# Patient Record
Sex: Female | Born: 1970
Health system: Southern US, Community
[De-identification: ages and names within clinical notes are randomized; demographics above are authoritative.]

## PROBLEM LIST (undated history)

## (undated) DIAGNOSIS — I5020 Unspecified systolic (congestive) heart failure: Secondary | ICD-10-CM

## (undated) DIAGNOSIS — I2510 Atherosclerotic heart disease of native coronary artery without angina pectoris: Secondary | ICD-10-CM

## (undated) DIAGNOSIS — R7989 Other specified abnormal findings of blood chemistry: Secondary | ICD-10-CM

## (undated) DIAGNOSIS — J9610 Chronic respiratory failure, unspecified whether with hypoxia or hypercapnia: Secondary | ICD-10-CM

## (undated) DIAGNOSIS — M109 Gout, unspecified: Secondary | ICD-10-CM

## (undated) DIAGNOSIS — I5030 Unspecified diastolic (congestive) heart failure: Secondary | ICD-10-CM

## (undated) DIAGNOSIS — E039 Hypothyroidism, unspecified: Secondary | ICD-10-CM

## (undated) DIAGNOSIS — R778 Other specified abnormalities of plasma proteins: Secondary | ICD-10-CM

## (undated) DIAGNOSIS — J449 Chronic obstructive pulmonary disease, unspecified: Secondary | ICD-10-CM

## (undated) DIAGNOSIS — J40 Bronchitis, not specified as acute or chronic: Secondary | ICD-10-CM

## (undated) DIAGNOSIS — I5041 Acute combined systolic (congestive) and diastolic (congestive) heart failure: Secondary | ICD-10-CM

## (undated) DIAGNOSIS — J189 Pneumonia, unspecified organism: Secondary | ICD-10-CM

## (undated) DIAGNOSIS — E662 Morbid (severe) obesity with alveolar hypoventilation: Secondary | ICD-10-CM

## (undated) DIAGNOSIS — N183 Chronic kidney disease, stage 3 unspecified: Secondary | ICD-10-CM

## (undated) DIAGNOSIS — I420 Dilated cardiomyopathy: Secondary | ICD-10-CM

## (undated) DIAGNOSIS — Z720 Tobacco use: Secondary | ICD-10-CM

## (undated) HISTORY — PX: THYROID SURGERY: SHX805

## (undated) HISTORY — DX: Dilated cardiomyopathy: I42.0

## (undated) HISTORY — DX: Chronic kidney disease, stage 3 unspecified: N18.30

## (undated) HISTORY — DX: Gout, unspecified: M10.9

## (undated) HISTORY — DX: Other specified abnormal findings of blood chemistry: R79.89

## (undated) HISTORY — DX: Other specified abnormalities of plasma proteins: R77.8

## (undated) HISTORY — DX: Hypothyroidism, unspecified: E03.9

---

## 2017-08-10 DIAGNOSIS — J189 Pneumonia, unspecified organism: Secondary | ICD-10-CM

## 2017-08-10 DIAGNOSIS — Z720 Tobacco use: Secondary | ICD-10-CM

## 2017-08-10 DIAGNOSIS — J40 Bronchitis, not specified as acute or chronic: Secondary | ICD-10-CM | POA: Diagnosis present

## 2017-08-10 HISTORY — DX: Bronchitis, not specified as acute or chronic: J40

## 2017-08-10 HISTORY — DX: Tobacco use: Z72.0

## 2017-08-10 HISTORY — DX: Pneumonia, unspecified organism: J18.9

## 2017-09-13 DIAGNOSIS — I5020 Unspecified systolic (congestive) heart failure: Secondary | ICD-10-CM

## 2017-09-13 DIAGNOSIS — I5033 Acute on chronic diastolic (congestive) heart failure: Secondary | ICD-10-CM | POA: Diagnosis not present

## 2017-09-13 DIAGNOSIS — J441 Chronic obstructive pulmonary disease with (acute) exacerbation: Secondary | ICD-10-CM | POA: Diagnosis not present

## 2017-09-13 DIAGNOSIS — I214 Non-ST elevation (NSTEMI) myocardial infarction: Secondary | ICD-10-CM | POA: Diagnosis not present

## 2017-09-13 DIAGNOSIS — A419 Sepsis, unspecified organism: Secondary | ICD-10-CM | POA: Diagnosis not present

## 2017-09-13 DIAGNOSIS — E039 Hypothyroidism, unspecified: Secondary | ICD-10-CM

## 2017-09-13 DIAGNOSIS — R0602 Shortness of breath: Secondary | ICD-10-CM | POA: Diagnosis not present

## 2017-09-13 DIAGNOSIS — J189 Pneumonia, unspecified organism: Secondary | ICD-10-CM | POA: Diagnosis not present

## 2017-09-13 DIAGNOSIS — I509 Heart failure, unspecified: Secondary | ICD-10-CM | POA: Diagnosis not present

## 2017-09-13 DIAGNOSIS — J9621 Acute and chronic respiratory failure with hypoxia: Secondary | ICD-10-CM | POA: Diagnosis not present

## 2017-09-13 DIAGNOSIS — I10 Essential (primary) hypertension: Secondary | ICD-10-CM | POA: Diagnosis not present

## 2017-09-13 HISTORY — DX: Unspecified systolic (congestive) heart failure: I50.20

## 2017-09-14 ENCOUNTER — Inpatient Hospital Stay (HOSPITAL_COMMUNITY)
Admission: AD | Admit: 2017-09-14 | Discharge: 2017-09-20 | DRG: 246 | Disposition: A | Discharge status: Home / Self Care | Payer: BLUE CROSS/BLUE SHIELD | Source: Other Acute Inpatient Hospital | Attending: Family Medicine | Admitting: Family Medicine

## 2017-09-14 ENCOUNTER — Encounter (HOSPITAL_COMMUNITY): Payer: Self-pay | Admitting: Internal Medicine

## 2017-09-14 DIAGNOSIS — E7800 Pure hypercholesterolemia, unspecified: Secondary | ICD-10-CM | POA: Diagnosis not present

## 2017-09-14 DIAGNOSIS — Z720 Tobacco use: Secondary | ICD-10-CM | POA: Diagnosis present

## 2017-09-14 DIAGNOSIS — J181 Lobar pneumonia, unspecified organism: Secondary | ICD-10-CM | POA: Diagnosis present

## 2017-09-14 DIAGNOSIS — J9622 Acute and chronic respiratory failure with hypercapnia: Secondary | ICD-10-CM | POA: Diagnosis present

## 2017-09-14 DIAGNOSIS — Z6841 Body Mass Index (BMI) 40.0 and over, adult: Secondary | ICD-10-CM

## 2017-09-14 DIAGNOSIS — J209 Acute bronchitis, unspecified: Secondary | ICD-10-CM | POA: Diagnosis present

## 2017-09-14 DIAGNOSIS — N183 Chronic kidney disease, stage 3 (moderate): Secondary | ICD-10-CM | POA: Diagnosis present

## 2017-09-14 DIAGNOSIS — B370 Candidal stomatitis: Secondary | ICD-10-CM | POA: Diagnosis present

## 2017-09-14 DIAGNOSIS — I130 Hypertensive heart and chronic kidney disease with heart failure and stage 1 through stage 4 chronic kidney disease, or unspecified chronic kidney disease: Principal | ICD-10-CM | POA: Diagnosis present

## 2017-09-14 DIAGNOSIS — I5041 Acute combined systolic (congestive) and diastolic (congestive) heart failure: Secondary | ICD-10-CM | POA: Diagnosis not present

## 2017-09-14 DIAGNOSIS — E785 Hyperlipidemia, unspecified: Secondary | ICD-10-CM | POA: Diagnosis present

## 2017-09-14 DIAGNOSIS — I440 Atrioventricular block, first degree: Secondary | ICD-10-CM | POA: Diagnosis present

## 2017-09-14 DIAGNOSIS — R0603 Acute respiratory distress: Secondary | ICD-10-CM

## 2017-09-14 DIAGNOSIS — J9621 Acute and chronic respiratory failure with hypoxia: Secondary | ICD-10-CM | POA: Diagnosis not present

## 2017-09-14 DIAGNOSIS — N179 Acute kidney failure, unspecified: Secondary | ICD-10-CM | POA: Diagnosis not present

## 2017-09-14 DIAGNOSIS — I2510 Atherosclerotic heart disease of native coronary artery without angina pectoris: Secondary | ICD-10-CM | POA: Diagnosis present

## 2017-09-14 DIAGNOSIS — T380X5A Adverse effect of glucocorticoids and synthetic analogues, initial encounter: Secondary | ICD-10-CM | POA: Diagnosis present

## 2017-09-14 DIAGNOSIS — I5033 Acute on chronic diastolic (congestive) heart failure: Secondary | ICD-10-CM | POA: Diagnosis not present

## 2017-09-14 DIAGNOSIS — R739 Hyperglycemia, unspecified: Secondary | ICD-10-CM | POA: Diagnosis present

## 2017-09-14 DIAGNOSIS — J189 Pneumonia, unspecified organism: Secondary | ICD-10-CM | POA: Diagnosis not present

## 2017-09-14 DIAGNOSIS — I509 Heart failure, unspecified: Secondary | ICD-10-CM

## 2017-09-14 DIAGNOSIS — F17210 Nicotine dependence, cigarettes, uncomplicated: Secondary | ICD-10-CM | POA: Diagnosis present

## 2017-09-14 DIAGNOSIS — J441 Chronic obstructive pulmonary disease with (acute) exacerbation: Secondary | ICD-10-CM | POA: Diagnosis present

## 2017-09-14 DIAGNOSIS — J1008 Influenza due to other identified influenza virus with other specified pneumonia: Secondary | ICD-10-CM | POA: Diagnosis present

## 2017-09-14 DIAGNOSIS — E662 Morbid (severe) obesity with alveolar hypoventilation: Secondary | ICD-10-CM | POA: Diagnosis present

## 2017-09-14 DIAGNOSIS — J40 Bronchitis, not specified as acute or chronic: Secondary | ICD-10-CM | POA: Diagnosis present

## 2017-09-14 DIAGNOSIS — R0602 Shortness of breath: Secondary | ICD-10-CM | POA: Diagnosis not present

## 2017-09-14 DIAGNOSIS — R748 Abnormal levels of other serum enzymes: Secondary | ICD-10-CM | POA: Diagnosis not present

## 2017-09-14 DIAGNOSIS — I10 Essential (primary) hypertension: Secondary | ICD-10-CM | POA: Diagnosis not present

## 2017-09-14 DIAGNOSIS — Z9981 Dependence on supplemental oxygen: Secondary | ICD-10-CM | POA: Diagnosis not present

## 2017-09-14 DIAGNOSIS — I420 Dilated cardiomyopathy: Secondary | ICD-10-CM | POA: Diagnosis present

## 2017-09-14 DIAGNOSIS — R7989 Other specified abnormal findings of blood chemistry: Secondary | ICD-10-CM

## 2017-09-14 DIAGNOSIS — J449 Chronic obstructive pulmonary disease, unspecified: Secondary | ICD-10-CM | POA: Diagnosis present

## 2017-09-14 DIAGNOSIS — E039 Hypothyroidism, unspecified: Secondary | ICD-10-CM | POA: Diagnosis present

## 2017-09-14 DIAGNOSIS — I161 Hypertensive emergency: Secondary | ICD-10-CM | POA: Diagnosis present

## 2017-09-14 DIAGNOSIS — I2583 Coronary atherosclerosis due to lipid rich plaque: Secondary | ICD-10-CM | POA: Diagnosis not present

## 2017-09-14 DIAGNOSIS — A419 Sepsis, unspecified organism: Secondary | ICD-10-CM | POA: Diagnosis not present

## 2017-09-14 DIAGNOSIS — I5043 Acute on chronic combined systolic (congestive) and diastolic (congestive) heart failure: Secondary | ICD-10-CM | POA: Diagnosis present

## 2017-09-14 DIAGNOSIS — J101 Influenza due to other identified influenza virus with other respiratory manifestations: Secondary | ICD-10-CM | POA: Diagnosis not present

## 2017-09-14 DIAGNOSIS — R778 Other specified abnormalities of plasma proteins: Secondary | ICD-10-CM

## 2017-09-14 DIAGNOSIS — N189 Chronic kidney disease, unspecified: Secondary | ICD-10-CM | POA: Diagnosis not present

## 2017-09-14 DIAGNOSIS — J440 Chronic obstructive pulmonary disease with acute lower respiratory infection: Secondary | ICD-10-CM | POA: Diagnosis present

## 2017-09-14 DIAGNOSIS — Z8249 Family history of ischemic heart disease and other diseases of the circulatory system: Secondary | ICD-10-CM | POA: Diagnosis not present

## 2017-09-14 DIAGNOSIS — I214 Non-ST elevation (NSTEMI) myocardial infarction: Secondary | ICD-10-CM

## 2017-09-14 HISTORY — DX: Chronic respiratory failure, unspecified whether with hypoxia or hypercapnia: J96.10

## 2017-09-14 HISTORY — DX: Morbid (severe) obesity with alveolar hypoventilation: E66.2

## 2017-09-14 HISTORY — DX: Atherosclerotic heart disease of native coronary artery without angina pectoris: I25.10

## 2017-09-14 HISTORY — DX: Pneumonia, unspecified organism: J18.9

## 2017-09-14 HISTORY — DX: Chronic obstructive pulmonary disease, unspecified: J44.9

## 2017-09-14 HISTORY — DX: Tobacco use: Z72.0

## 2017-09-14 HISTORY — DX: Acute combined systolic (congestive) and diastolic (congestive) heart failure: I50.41

## 2017-09-14 HISTORY — DX: Unspecified systolic (congestive) heart failure: I50.20

## 2017-09-14 HISTORY — DX: Unspecified diastolic (congestive) heart failure: I50.30

## 2017-09-14 HISTORY — DX: Bronchitis, not specified as acute or chronic: J40

## 2017-09-14 HISTORY — DX: Acute and chronic respiratory failure with hypoxia: J96.21

## 2017-09-14 LAB — COMPREHENSIVE METABOLIC PANEL WITH GFR
ALT: 23 U/L (ref 14–54)
AST: 22 U/L (ref 15–41)
Albumin: 3.2 g/dL — ABNORMAL LOW (ref 3.5–5.0)
Alkaline Phosphatase: 84 U/L (ref 38–126)
Anion gap: 16 — ABNORMAL HIGH (ref 5–15)
BUN: 26 mg/dL — ABNORMAL HIGH (ref 6–20)
CO2: 26 mmol/L (ref 22–32)
Calcium: 8.7 mg/dL — ABNORMAL LOW (ref 8.9–10.3)
Chloride: 96 mmol/L — ABNORMAL LOW (ref 101–111)
Creatinine, Ser: 1.45 mg/dL — ABNORMAL HIGH (ref 0.44–1.00)
GFR calc Af Amer: 49 mL/min — ABNORMAL LOW
GFR calc non Af Amer: 42 mL/min — ABNORMAL LOW
Glucose, Bld: 146 mg/dL — ABNORMAL HIGH (ref 65–99)
Potassium: 4 mmol/L (ref 3.5–5.1)
Sodium: 138 mmol/L (ref 135–145)
Total Bilirubin: 0.8 mg/dL (ref 0.3–1.2)
Total Protein: 6.7 g/dL (ref 6.5–8.1)

## 2017-09-14 LAB — CBC WITH DIFFERENTIAL/PLATELET
BASOS ABS: 0 10*3/uL (ref 0.0–0.1)
BASOS PCT: 0 %
EOS ABS: 0 10*3/uL (ref 0.0–0.7)
EOS PCT: 0 %
HCT: 41.4 % (ref 36.0–46.0)
Hemoglobin: 13.3 g/dL (ref 12.0–15.0)
LYMPHS PCT: 5 %
Lymphs Abs: 0.8 10*3/uL (ref 0.7–4.0)
MCH: 28.8 pg (ref 26.0–34.0)
MCHC: 32.1 g/dL (ref 30.0–36.0)
MCV: 89.6 fL (ref 78.0–100.0)
MONO ABS: 0.4 10*3/uL (ref 0.1–1.0)
Monocytes Relative: 3 %
Neutro Abs: 14.2 10*3/uL — ABNORMAL HIGH (ref 1.7–7.7)
Neutrophils Relative %: 92 %
PLATELETS: 253 10*3/uL (ref 150–400)
RBC: 4.62 MIL/uL (ref 3.87–5.11)
RDW: 15.5 % (ref 11.5–15.5)
WBC: 15.5 10*3/uL — AB (ref 4.0–10.5)

## 2017-09-14 LAB — RESPIRATORY PANEL BY PCR
ADENOVIRUS-RVPPCR: NOT DETECTED
Bordetella pertussis: NOT DETECTED
CHLAMYDOPHILA PNEUMONIAE-RVPPCR: NOT DETECTED
CORONAVIRUS 229E-RVPPCR: NOT DETECTED
Coronavirus HKU1: NOT DETECTED
Coronavirus NL63: NOT DETECTED
Coronavirus OC43: NOT DETECTED
INFLUENZA A H3-RVPPCR: DETECTED — AB
INFLUENZA B-RVPPCR: NOT DETECTED
MYCOPLASMA PNEUMONIAE-RVPPCR: NOT DETECTED
Metapneumovirus: NOT DETECTED
PARAINFLUENZA VIRUS 1-RVPPCR: NOT DETECTED
PARAINFLUENZA VIRUS 4-RVPPCR: NOT DETECTED
Parainfluenza Virus 2: NOT DETECTED
Parainfluenza Virus 3: NOT DETECTED
RESPIRATORY SYNCYTIAL VIRUS-RVPPCR: NOT DETECTED
Rhinovirus / Enterovirus: NOT DETECTED

## 2017-09-14 LAB — GLUCOSE, CAPILLARY
GLUCOSE-CAPILLARY: 130 mg/dL — AB (ref 65–99)
Glucose-Capillary: 161 mg/dL — ABNORMAL HIGH (ref 65–99)
Glucose-Capillary: 131 mg/dL — ABNORMAL HIGH (ref 65–99)

## 2017-09-14 LAB — TROPONIN I
Troponin I: 0.21 ng/mL
Troponin I: 0.2 ng/mL (ref <0.03)

## 2017-09-14 LAB — PROTIME-INR
INR: 0.96
Prothrombin Time: 12.7 s (ref 11.4–15.2)

## 2017-09-14 LAB — MAGNESIUM: Magnesium: 2.2 mg/dL (ref 1.7–2.4)

## 2017-09-14 LAB — BRAIN NATRIURETIC PEPTIDE: B NATRIURETIC PEPTIDE 5: 501.7 pg/mL — AB (ref 0.0–100.0)

## 2017-09-14 LAB — MRSA PCR SCREENING: MRSA by PCR: NEGATIVE

## 2017-09-14 LAB — TSH: TSH: 1.278 u[IU]/mL (ref 0.350–4.500)

## 2017-09-14 MED ORDER — MAGNESIUM SULFATE 2 GM/50ML IV SOLN
2.0000 g | Freq: Once | INTRAVENOUS | Status: AC
  Administered 2017-09-14: 2 g via INTRAVENOUS
  Filled 2017-09-14: qty 50

## 2017-09-14 MED ORDER — ASPIRIN EC 81 MG PO TBEC
81.0000 mg | DELAYED_RELEASE_TABLET | Freq: Every day | ORAL | Status: DC
  Administered 2017-09-15 – 2017-09-20 (×4): 81 mg via ORAL
  Filled 2017-09-14 (×6): qty 1

## 2017-09-14 MED ORDER — ALPRAZOLAM 0.25 MG PO TABS
0.2500 mg | ORAL_TABLET | Freq: Two times a day (BID) | ORAL | Status: DC | PRN
Start: 2017-09-14 — End: 2017-09-16

## 2017-09-14 MED ORDER — ALBUTEROL SULFATE (2.5 MG/3ML) 0.083% IN NEBU
2.5000 mg | INHALATION_SOLUTION | RESPIRATORY_TRACT | Status: DC | PRN
  Administered 2017-09-14 – 2017-09-15 (×3): 2.5 mg via RESPIRATORY_TRACT
  Filled 2017-09-14 (×3): qty 3

## 2017-09-14 MED ORDER — ENOXAPARIN SODIUM 40 MG/0.4ML ~~LOC~~ SOLN
40.0000 mg | SUBCUTANEOUS | Status: DC
  Filled 2017-09-14: qty 0.4

## 2017-09-14 MED ORDER — DOXYCYCLINE HYCLATE 100 MG PO TABS
100.0000 mg | ORAL_TABLET | Freq: Two times a day (BID) | ORAL | Status: DC
  Administered 2017-09-14 – 2017-09-19 (×11): 100 mg via ORAL
  Filled 2017-09-14 (×11): qty 1

## 2017-09-14 MED ORDER — SODIUM CHLORIDE 0.9% FLUSH: 3.0000 mL | INTRAVENOUS | Status: DC | PRN

## 2017-09-14 MED ORDER — FUROSEMIDE 10 MG/ML IJ SOLN
40.0000 mg | Freq: Three times a day (TID) | INTRAMUSCULAR | Status: DC
  Administered 2017-09-14 – 2017-09-15 (×3): 40 mg via INTRAVENOUS
  Filled 2017-09-14 (×4): qty 4

## 2017-09-14 MED ORDER — CARVEDILOL 3.125 MG PO TABS
3.1250 mg | ORAL_TABLET | Freq: Two times a day (BID) | ORAL | Status: DC
  Administered 2017-09-14 – 2017-09-17 (×6): 3.125 mg via ORAL
  Filled 2017-09-14 (×7): qty 1

## 2017-09-14 MED ORDER — NICOTINE 14 MG/24HR TD PT24
14.0000 mg | MEDICATED_PATCH | Freq: Every day | TRANSDERMAL | Status: DC
  Filled 2017-09-14 (×3): qty 1

## 2017-09-14 MED ORDER — ALBUTEROL SULFATE (2.5 MG/3ML) 0.083% IN NEBU
2.5000 mg | INHALATION_SOLUTION | RESPIRATORY_TRACT | Status: DC | PRN
  Administered 2017-09-14: 2.5 mg via RESPIRATORY_TRACT

## 2017-09-14 MED ORDER — ACETAMINOPHEN 325 MG PO TABS
650.0000 mg | ORAL_TABLET | ORAL | Status: DC | PRN
  Administered 2017-09-14 – 2017-09-20 (×9): 650 mg via ORAL
  Filled 2017-09-14 (×9): qty 2

## 2017-09-14 MED ORDER — ALPRAZOLAM 0.25 MG PO TABS
0.2500 mg | ORAL_TABLET | Freq: Every evening | ORAL | Status: DC | PRN
  Administered 2017-09-14 – 2017-09-15 (×2): 0.25 mg via ORAL
  Filled 2017-09-14 (×2): qty 1

## 2017-09-14 MED ORDER — IPRATROPIUM-ALBUTEROL 0.5-2.5 (3) MG/3ML IN SOLN
3.0000 mL | Freq: Four times a day (QID) | RESPIRATORY_TRACT | Status: DC
  Administered 2017-09-14 – 2017-09-18 (×16): 3 mL via RESPIRATORY_TRACT
  Filled 2017-09-14 (×16): qty 3

## 2017-09-14 MED ORDER — ALBUTEROL SULFATE (2.5 MG/3ML) 0.083% IN NEBU
2.5000 mg | INHALATION_SOLUTION | RESPIRATORY_TRACT | Status: DC | PRN
  Filled 2017-09-14: qty 3

## 2017-09-14 MED ORDER — OSELTAMIVIR PHOSPHATE 75 MG PO CAPS
75.0000 mg | ORAL_CAPSULE | Freq: Two times a day (BID) | ORAL | Status: DC
  Administered 2017-09-15: 75 mg via ORAL
  Filled 2017-09-14 (×2): qty 1

## 2017-09-14 MED ORDER — ONDANSETRON HCL 4 MG/2ML IJ SOLN: 4.0000 mg | Freq: Four times a day (QID) | INTRAMUSCULAR | Status: DC | PRN

## 2017-09-14 MED ORDER — SODIUM CHLORIDE 0.9% FLUSH
3.0000 mL | Freq: Two times a day (BID) | INTRAVENOUS | Status: DC
  Administered 2017-09-14 – 2017-09-17 (×8): 3 mL via INTRAVENOUS

## 2017-09-14 MED ORDER — TIOTROPIUM BROMIDE MONOHYDRATE 18 MCG IN CAPS
18.0000 ug | ORAL_CAPSULE | Freq: Every day | RESPIRATORY_TRACT | Status: DC
  Filled 2017-09-14: qty 5

## 2017-09-14 MED ORDER — SODIUM CHLORIDE 0.9 % IV SOLN: 250.0000 mL | INTRAVENOUS | Status: DC | PRN

## 2017-09-14 MED ORDER — MOMETASONE FURO-FORMOTEROL FUM 200-5 MCG/ACT IN AERO
2.0000 | INHALATION_SPRAY | Freq: Two times a day (BID) | RESPIRATORY_TRACT | Status: DC
  Administered 2017-09-14 – 2017-09-20 (×11): 2 via RESPIRATORY_TRACT
  Filled 2017-09-14 (×2): qty 8.8

## 2017-09-14 MED ORDER — IPRATROPIUM-ALBUTEROL 0.5-2.5 (3) MG/3ML IN SOLN
3.0000 mL | Freq: Four times a day (QID) | RESPIRATORY_TRACT | Status: DC
  Administered 2017-09-14: 3 mL via RESPIRATORY_TRACT
  Filled 2017-09-14: qty 3

## 2017-09-14 MED ORDER — LISINOPRIL 5 MG PO TABS
5.0000 mg | ORAL_TABLET | Freq: Every day | ORAL | Status: DC
  Administered 2017-09-15: 5 mg via ORAL
  Filled 2017-09-14 (×2): qty 1

## 2017-09-14 MED ORDER — PREDNISONE 20 MG PO TABS
40.0000 mg | ORAL_TABLET | Freq: Every day | ORAL | Status: DC
  Administered 2017-09-15: 40 mg via ORAL
  Filled 2017-09-14: qty 2

## 2017-09-14 NOTE — Progress Notes (Signed)
Patient requested BIPAP due to shortness of breath. Patient did not appear to be in distress upon RT arrival. Patient was able to speak in full sentences & all vitals were within normal limits. Patient was placed on BIPAP 12/6. Back up rate: 8, FIO2: 40%  for comfort & is tolerating it well at this time.

## 2017-09-14 NOTE — H&P (Signed)
History and Physical    Katie Rasmussen JTT:[id number] DOB: 1970-09-07 DOA: 09/14/2017  PCP: Patient, No Pcp Per (Confirm with patient/family/NH records and if not entered, this has to be entered at [facility] point of entry) Patient coming from: [facility]  I have personally briefly reviewed patient's old medical records in [facility]  Chief Complaint:   HPI: Katie Rasmussen is a 47 y.o. female with medical history significant of Patient is a 47 year old F with with history of COPD, on home O2, morbid obesity, diastolic heart failure, hypothyroidism-admitted with a 2 week history of cough, shortness of breath, subjective fever, worsening lower extremity edema-presented to the ED on 03/07 with difficulty breathing-on arrival-she was cyanotic, with acute on chronic hypoxic and hypercarbic respiratory failure (pH of 7.1)-she was admitted to the ICU, given a trial of BiPAP with Lasix, empiric antimicrobial therapy and other supportive care-with plans to intubate if no improvement, however with these measures, patient had significant diuresis and thankfully improved rapidly, she was liberated off the BiPAP on 3/7 evening and transitioned to nasal cannula. She was found to have elevated troponins to a peak of 0.35,troponins have subsequently down trended, EKG was sinus rhythm with non specific ST changes. Echocardiogram showed (new onset) EF of around 30-35%, with akinetic anterior wall-concern for takotsubo cardiomyopathy-after discussion with Cardiology-she is being transferred to [facility] for cardiac catheterization.   I evaluated the patient on arrival to our cardiac unit.  She was desatting easily sitting up on the side of the bed.  Currently denying chest pain but very obviously short of breath.  Denies nausea vomiting diarrhea or constipation no fevers or chills.   Review of Systems: As per HPI otherwise all other systems reviewed and  negative.    Past Medical History:    Diagnosis Date  . Acute combined systolic and diastolic CHF, NYHA class 3 (HCC)   . Bronchitis due to tobacco use (HCC) 08/2017  . Chronic respiratory failure (HCC)   . COPD (chronic obstructive pulmonary disease) (HCC)   . Diastolic CHF (HCC)   . Obesity hypoventilation syndrome (HCC)   . Pneumonia 08/2017  . Systolic CHF with reduced left ventricular function, NYHA class 3 (HCC) 09/13/2017    History reviewed. No pertinent surgical history.   reports that she quit smoking 2 days ago. Her smoking use included cigarettes. She has a 7.00 pack-year smoking history. She does not have any smokeless tobacco history on file. Her alcohol and drug histories are not on file.  Allergies  Allergen Reactions  . Iodine Hives and Itching  . Shellfish Allergy Hives and Itching    Family History  Problem Relation Age of Onset  . Heart disease Mother   . Hypertension Mother   . Heart disease Father     Prior to Admission medications   Not on File    Physical Exam: Vitals:   09/14/17 1200 09/14/17 1301 09/14/17 1600 09/14/17 1620  BP: (!) 142/90   (!) 142/90  Pulse:    81  Resp:    (!) 32  Temp: 98.6 F (37 C)  98.6 F (37 C)   TempSrc: Oral     SpO2:  97%  95%  Weight:   107.6 kg (237 lb 3.4 oz)   Height:   5\' 1"  (1.549 m)    .TCS Constitutional: Anxious with increased respiratory rate and increased use of accessory muscles of respiration Vitals:   09/14/17 1200 09/14/17 1301 09/14/17 1600 09/14/17 1620  BP: (!) 142/90   Marland Kitchen)  142/90  Pulse:    81  Resp:    (!) 32  Temp: 98.6 F (37 C)  98.6 F (37 C)   TempSrc: Oral     SpO2:  97%  95%  Weight:   107.6 kg (237 lb 3.4 oz)   Height:   5\' 1"  (1.549 m)    Eyes: PERRL, lids and conjunctivae normal ENMT: Mucous membranes are moist. Posterior pharynx clear of any exudate or lesions.Normal dentition.  Neck: normal, supple, no masses, no thyromegaly Respiratory: Bilateral increased accessory muscle use with severe inspiratory  and expiratory wheezing and very poor air movement Cardiovascular: Regular rate and rhythm, no murmurs / rubs / gallops. No extremity edema. 2+ pedal pulses. No carotid bruits.  Abdomen: no tenderness, no masses palpated. No hepatosplenomegaly. Bowel sounds positive.  Musculoskeletal: no clubbing / cyanosis. No joint deformity upper and lower extremities. Good ROM, no contractures. Normal muscle tone.  Skin: no rashes, lesions, ulcers. No induration Neurologic: CN 2-12 grossly intact. Sensation intact, DTR normal. Strength 5/5 in all 4.  Psychiatric: Normal judgment and insight. Alert and oriented x 3. Normal mood.     Labs on Admission: I have personally reviewed following labs and imaging studies  CBC: Recent Labs  Lab 09/14/17 1349  WBC 15.5*  NEUTROABS 14.2*  HGB 13.3  HCT 41.4  MCV 89.6  PLT 253   Basic Metabolic Panel: Recent Labs  Lab 09/14/17 1349  NA 138  K 4.0  CL 96*  CO2 26  GLUCOSE 146*  BUN 26*  CREATININE 1.45*  CALCIUM 8.7*  MG 2.2   GFR: Estimated Creatinine Clearance: 54.3 mL/min (A) (by C-G formula based on SCr of 1.45 mg/dL (H)). Liver Function Tests: Recent Labs  Lab 09/14/17 1349  AST 22  ALT 23  ALKPHOS 84  BILITOT 0.8  PROT 6.7  ALBUMIN 3.2*   No results for input(s): LIPASE, AMYLASE in the last 168 hours. No results for input(s): AMMONIA in the last 168 hours. Coagulation Profile: Recent Labs  Lab 09/14/17 1349  INR 0.96   Cardiac Enzymes: Recent Labs  Lab 09/14/17 1349  TROPONINI 0.21*   BNP (last 3 results) No results for input(s): PROBNP in the last 8760 hours. HbA1C: No results for input(s): HGBA1C in the last 72 hours. CBG: Recent Labs  Lab 09/14/17 1201 09/14/17 1629  GLUCAP 161* 131*   Lipid Profile: No results for input(s): CHOL, HDL, LDLCALC, TRIG, CHOLHDL, LDLDIRECT in the last 72 hours. Thyroid Function Tests: Recent Labs    09/14/17 1349  TSH 1.278   Anemia Panel: No results for input(s):  VITAMINB12, FOLATE, FERRITIN, TIBC, IRON, RETICCTPCT in the last 72 hours. Urine analysis: No results found for: COLORURINE, APPEARANCEUR, LABSPEC, PHURINE, GLUCOSEU, HGBUR, BILIRUBINUR, KETONESUR, PROTEINUR, UROBILINOGEN, NITRITE, LEUKOCYTESUR  Radiological Exams on Admission: No results found.  EKG: Independently reviewed. Normal sinus rhythm Anteroseptal infarct , age undetermined T wave abnormality, consider lateral ischemia Abnormal ECG No previous tracing  Assessment/Plan Principal Problem:   Acute combined systolic and diastolic CHF, NYHA class 3 (HCC) Active Problems:   Acute on chronic respiratory failure with hypoxia and hypercapnia (HCC)   COPD (chronic obstructive pulmonary disease) (HCC)   Pneumonia   Obesity hypoventilation syndrome (HCC)   Bronchitis due to tobacco use (HCC)   Acute on chronic hypoxemic and hypercarbic respiratory failure:  Much improved with empiric antimicrobial therapy, IV Lasix, bronchodilators. Initially she was acutely ill and at risk of getting intubated-she was monitored closely in the ICU for  a few hours on BiPAP-she had significant response to diuretics and rapidly improved. She was liberated off the ventilator on 03/07 afternoon, and transitioned to nasal cannula. Initially she was thought to have mostly pneumonia with some amount of decompensated heart failure and or COPD exacerbation, but given rapid improvement-I suspect the main pathophysiology was decompensated systolic heart failure. She is morbidly obese-on home O2-she probably has OSA/ohs-and has been placed on BiPAP q.h.s.Marland Kitchen  Newly diagnosed decompensated systolic heart failure:  Rapid response to diuretics, -4 0.3 L overnight. Marked decrease in edema, and significant improvement in lung exam. Echocardiogram showed EF around 30-35% with akinetic anterior wall-high suspicion for takotsubo cardiomyopathy per Cardiology. Remains on diuretics, beta-blocker and lisinopril has been added.     Minimally elevated troponins:  Probably more of demand ischemia due to decompensated heart failure-but given wall motion abnormalities seen on echocardiogram-has been started on IV heparin, along with aspirin, beta-blocker and statin. Recommendations are to transfer to [facility] for a left heart catheterization.  Plan is for cardiac catheterization on Monday after pulmonary status is improved.  Acute severe COPD exacerbation:  Initially on admission thought to have COPD exacerbation-she does have a diagnosis of COPD and is on home O2-but I suspect wheezing heard on admission could have been cardiac wheezing.  Continue steroids and bronchodilators.    Sepsis with multi lobar pneumonia due to a 1 H3 influenza:  Initially patient thought to have pneumonia then she was thought to have mostly congestive heart failure however respiratory panel obtained today reveals influenza A 1H3 will start Tamiflu.  She probably has a associated influenza pneumonia.  Will isolate patient.  Hypertensive emergency:  Blood pressure is significantly elevated on admission (systolic close to 200 on my exam)-I suspect this was probably a stress response/anxiety due to respiratory failure, bronchospasm etc. With improvement in respiration, diuretics, and as needed IV hydralazine she has improved rapidly. She currently is on Coreg, lisinopril, cardiology has added hydralazine. Continue to follow and optimize accordingly.  Chronic kidney disease stage 3:  Creatinine stable at 1.3-continue to monitor closely while on diuretics.  Hypothyroidism:  Continue levothyroxine  Morbid obesity/Probable ohs/OSA:  Placed on BiPAP q.h.s. While in the hospital-she will need a sleep study in the outpatient setting. Please see if she can qualify for positive pressure ventilation on discharge based on a ABG findings.  DVT prophylaxis: IV heparin Code status: Full code Family Communication: Daughter At bedside Disposition:  [facility] Consultants: Cards    Lahoma Crocker MD FACP [facility] Pager [phone]  If 7PM-7AM, please contact night-coverage [email or web address] Password [facility]  09/14/2017, 5:58 PM

## 2017-09-14 NOTE — Consult Note (Addendum)
Cardiology Consultation:   Patient ID: Katie Rasmussen; [id number]; [birth year]   Admit date: 09/14/2017 Date of Consult: 09/14/2017  Primary Care Provider: Patient, No Pcp Per Primary Cardiologist: New  Patient Profile:   Katie Rasmussen is a 47 y.o. female with a hx of COPD on home O2, morbid obesity, acute on chronic systolic and diastolic heart failure (per echo 09/13/2017 with new EF 30-35% with akinetic anterior wall concerning for Takotsubo cardiomyopathy), hypothyroidism and OSA who is being seen today for the evaluation of newly diagnosed symptomatic systolic heart failure and elevated troponin levels at the request of Dr. Jerral Ralph from [facility].  History of Present Illness:   Katie Rasmussen is a 47 year old female with a past medical history of COPD on home O2, morbid obesity, diastolic heart failure (per echo 09/13/2017 EF 30-35% with akinetic anterior wall concerning for takotsubo cardiomyopathy), hypothyroidism, and OSA who initially presented to [facility] on 09/13/2017-09/14/17 with a 2-week history of cough, shortness of breath at rest and with exertion with worsening lower extremity edema. Approximately 2 weeks ago, she went to her PCP with similar, but lesser symptoms thought to be secondary to URI or PNA  and was prescribed Levaquin and prednisone therapy without relief. On 09/12/17, she woke from her sleep with severe shortness of breath. She took several prednisone tablets and drove herself to the ED. She was found to be cyanotic and hypoxic;  in acute on chronic hypoxic and hypercarbic respiratory failure. She was ultimately placed on BiPAP ventilation, given IV Lasix and started on empiric antimicrobial therapy given an elevated lactic acid of 2.4 with plans to intubate if no improvement. Fortunately with these measures she rapidly improved. An echocardiogram was completed which showed new onset systolic heart failure with severe cardiomyopathy, a reduced EF of 30-35%  and an akinetic anterior wall concerning for Takotsubo cardiomyopathy. Plans were made to transfer her to [facility] for further ischemic workup per Cardiology. She continues to deny chest or back pain, diaphoresis, numbness in the arms or hands, no dizziness or syncope. She does admit to severe orthopnea and PND and a dry cough. Denies nausea, vomiting, fever or malaise. Other issues while at [facility] include community-acquired pneumonia, sepsis, hypertension and hypothyroidism.  It appears, according to records from [facility] her last echocardiogram was 11/11/2016 which showed an EF of 60% with mild concentric LVH, mildly impaired diastolic dysfunction.   In [facility] emergency department, a chest x-ray was completed which  showed new multifocal airspace disease involving right and upper and lower lobes. A troponin level was drawn which was found to peak at 0.34. Her creatinine was 1.30.  According to physical exam notes per MD she appeared grossly fluid volume overloaded on exam with a BNP level of 1040. EKG showed initial rhythm on 09/13/2017 of sinus tach with a 1st degree AV block, however subsequent telemetry tracings sent with patient in [place] show normal sinus rhythm. She was stabilized and started on heparin drip prior to transfer. Plan is for cardiac cath today or Monday for further ischemic evaluation.   Past Medical History:  Diagnosis Date  . Acute combined systolic and diastolic CHF, NYHA class 3 (HCC)   . Bronchitis due to tobacco use (HCC) 08/2017  . Chronic respiratory failure (HCC)   . COPD (chronic obstructive pulmonary disease) (HCC)   . Diastolic CHF (HCC)   . Obesity hypoventilation syndrome (HCC)   . Pneumonia 08/2017  . Systolic CHF with reduced left ventricular function, NYHA class  3 (HCC) 09/13/2017     Prior to Admission medications   Not on File    Inpatient Medications: Scheduled Meds: . aspirin EC  81 mg Oral Daily  . carvedilol  3.125  mg Oral BID WC  . doxycycline  100 mg Oral Q12H  . enoxaparin (LOVENOX) injection  40 mg Subcutaneous Q24H  . furosemide  40 mg Intravenous Q8H  . ipratropium-albuterol  3 mL Nebulization QID  . lisinopril  5 mg Oral Daily  . mometasone-formoterol  2 puff Inhalation BID  . nicotine  14 mg Transdermal Daily  . [START ON 09/15/2017] predniSONE  40 mg Oral Q breakfast  . sodium chloride flush  3 mL Intravenous Q12H  . tiotropium  18 mcg Inhalation Daily   Continuous Infusions: . sodium chloride    . magnesium sulfate 1 - 4 g bolus IVPB 2 g (09/14/17 1553)   PRN Meds: sodium chloride, acetaminophen, albuterol, ALPRAZolam, ondansetron (ZOFRAN) IV, sodium chloride flush  Allergies:   Allergies not on file  Social History:   Social History   Socioeconomic History  . Marital status: Married    Spouse name: Not on file  . Number of children: 3  . Years of education: Not on file  . Highest education level: Not on file  Social Needs  . Financial resource strain: Not on file  . Food insecurity - worry: Not on file  . Food insecurity - inability: Not on file  . Transportation needs - medical: Not on file  . Transportation needs - non-medical: Not on file  Occupational History  . 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 Smoking status: Former Smoker    Packs/day: 0.50    Years: 14.00    Pack years: 7.00    Types: Cigarettes    Last attempt to quit: 09/12/2017  Substance and Sexual Activity  . Alcohol use: Not on file  . Drug use: Not on file  . Sexual activity: Not on file  Other Topics Concern  . Not on file  Social History Narrative  . Not on file    Family History:   Family History  Problem Relation Age of Onset  . Heart disease Mother   . Hypertension Mother   . Heart disease Father    Family Status:  Family Status  Relation Name Status  . Mother  Alive, age 22y  . Father  Deceased at age 63    ROS:  Please see the history of present  illness.  All other ROS reviewed and negative.     Physical Exam/Data:   Vitals:   09/14/17 1301  SpO2: 97%    Intake/Output Summary (Last 24 hours) at 09/14/2017 1611 Last data filed at 09/14/2017 1200 Gross per 24 hour  Intake -  Output 700 ml  Net -700 ml   There were no vitals filed for this visit. There is no height or weight on file to calculate BMI.   General: Obese, mild distress with exertion  Skin: Warm, dry, intact  Head: Normocephalic, atraumatic, clear, moist mucus membranes. Neck: Negative for carotid bruits. No JVD Lungs: Wheezes and rhonchi throughout all lung fields. One sentence communication. Breathing is labored. Cardiovascular: RRR with S1 S2. No murmurs, rubs, or gallops Abdomen: Soft, non-tender, non-distended with normoactive bowel sounds. No obvious abdominal masses. MSK: Strength and tone appear normal for age. 5/5 in all extremities Extremities: 1+ BLE edema. No clubbing or cyanosis. DP/PT pulses 2+ bilaterally Neuro: Alert  and oriented. No focal deficits. No facial asymmetry. MAE spontaneously. Psych: Responds to questions appropriately with normal affect.     EKG:  The EKG was personally reviewed and demonstrates: 09/14/17 NSR with T-wave inversions in leads I, aVL, V1-V6 Telemetry:  Telemetry was personally reviewed and demonstrates: NSR HR 82  Relevant CV Studies:  ECHO: Per [facility], awaiting records to be input into EPIC  CATH: NA   Laboratory Data:  Chemistry Recent Labs  Lab 09/14/17 1349  NA 138  K 4.0  CL 96*  CO2 26  GLUCOSE 146*  BUN 26*  CREATININE 1.45*  CALCIUM 8.7*  GFRNONAA 42*  GFRAA 49*  ANIONGAP 16*    Total Protein  Date Value Ref Range Status  09/14/2017 6.7 6.5 - 8.1 g/dL Final   Albumin  Date Value Ref Range Status  09/14/2017 3.2 (L) 3.5 - 5.0 g/dL Final   AST  Date Value Ref Range Status  09/14/2017 22 15 - 41 U/L Final   ALT  Date Value Ref Range Status  09/14/2017 23 14 - 54 U/L Final    Alkaline Phosphatase  Date Value Ref Range Status  09/14/2017 84 38 - 126 U/L Final   Total Bilirubin  Date Value Ref Range Status  09/14/2017 0.8 0.3 - 1.2 mg/dL Final   Hematology Recent Labs  Lab 09/14/17 1349  WBC 15.5*  RBC 4.62  HGB 13.3  HCT 41.4  MCV 89.6  MCH 28.8  MCHC 32.1  RDW 15.5  PLT 253   Cardiac Enzymes Recent Labs  Lab 09/14/17 1349  TROPONINI 0.21*   No results for input(s): TROPIPOC in the last 168 hours.  BNP Recent Labs  Lab 09/14/17 1333  BNP 501.7*    DDimer No results for input(s): DDIMER in the last 168 hours. TSH:  Lab Results  Component Value Date   TSH 1.278 09/14/2017   Lipids:No results found for: CHOL, HDL, LDLCALC, LDLDIRECT, TRIG, CHOLHDL HgbA1c:No results found for: HGBA1C  Radiology/Studies:  No results found.  Assessment and Plan:   1. Acute on chronic systolic and diastolic heart failure likely secondary to Takosbuto cardiomyopathy: -Strict I&O, daily weights -Weight, 220lb obtained from [place] records -I&O, net negative 700 ml since admission to [facility] -Continue Lasix IV 40mg  Q8H  -Continue to monitor over the weekend with plans to get her respiratory status more stable, continue diuresis, and schedule her for cath on Monday, 09/17/17. Will place these order. Cath lab aware.  2.  Elevated troponin level: -Trop levels documented from her end of hospital, <0.01, 0.28, peak 0.34>>0.21 -Denies chest pain or other ACS symptoms  -Continue ASA 81 and carvedilol 3.125  -Given terrible respiratory status (wheezing and severe orthopnea), see plan as above   3. Acute on chronic respiratory failure: -Continues to have audible wheezing>>on Duoneb, Dulera, Proventil, prednisone -O2 saturation stable at 93%-98% on [place]  -PRN BiPAP ventilation  -Consider transfer to ICU for greater respiratory monitoring, fear that she may decompensate easily   4. COPD secondary to tobacco use: -Stable on Duoneb, Dulera, Spiriva,  prednisone -Continue transdermal nicotine patch -Smoking cessation strongly encouraged  5. HTN: -Elevated, 181/82 from [place]>>do not see current BP reading since admission to [facility] -Lisinopril 5mg , carvedilol 3.125 -Will leave for now>>and adjust medications based on more current readings when available  6. Acute Kidney Insufficiency: -Records from [place] suggest that her renal function is worsening. Per review, her creatinine was 1.30>> 1.45 now -Would presume that this is secondary to fluid volume overload -  At this point, would continue to diurese and monitor for improvement   7. Hypothyroidism:  -Per IM  -Will need to be back on home dose of Levothyroxine  8. Community acquired PNA: -Per IM  -Doxycycline   For questions or updates, please contact [facility] Please consult [email or web address] for contact info under Cardiology/STEMI.   Raliegh Ip NP-C [facility] Pager: [phone] 09/14/2017 4:11 PM  Attending Note:   The patient was seen and examined.  Agree with assessment and plan as noted above.  Changes made to the above note as needed.  Patient seen and independently examined with Cira Rue, NP .   We discussed all aspects of the encounter. I agree with the assessment and plan as stated above.  1.  Acute combined systolic and diastolic  congestive heart failure: The patient has a history of severe COPD and is on home O2.  She has a history of morbid obesity.  She was recently seen at [facility] with respiratory distress and was found to have moderate to severe LV dysfunction with an ejection fraction of 30-35%.  He was akinesis of the wall and possibly apex.  There was some thought that this might be due to Takotsubo syndrome. She denies any angina.  She was transferred up to [facility] for further evaluation and for heart catheterization.  Today she is quite tight on exam.  She has severe wheezing and severe respiratory distress.  She is not  stable enough to consider heart catheterization today.  Will need tuneup of her COPD as well as diuresis.  We will anticipate doing a heart catheterization on her on Monday.  2.  Respiratory distress: Patient has severe respiratory distress.  She has lots of wheezing and quite likely has pneumonia.  Flu panel has been sent.   I have spent a total of 40 minutes with patient reviewing hospital  notes , telemetry, EKGs, labs and examining patient as well as establishing an assessment and plan that was discussed with the patient. > 50% of time was spent in direct patient care.    Vesta Mixer, Montez Hageman., MD, [facility] 09/14/2017, 4:41 PM [address]  [address] Office [phone] Pager [phone]

## 2017-09-15 ENCOUNTER — Inpatient Hospital Stay (HOSPITAL_COMMUNITY): Payer: BLUE CROSS/BLUE SHIELD

## 2017-09-15 DIAGNOSIS — E662 Morbid (severe) obesity with alveolar hypoventilation: Secondary | ICD-10-CM

## 2017-09-15 DIAGNOSIS — J101 Influenza due to other identified influenza virus with other respiratory manifestations: Secondary | ICD-10-CM

## 2017-09-15 DIAGNOSIS — J441 Chronic obstructive pulmonary disease with (acute) exacerbation: Secondary | ICD-10-CM

## 2017-09-15 LAB — BASIC METABOLIC PANEL
Anion gap: 16 — ABNORMAL HIGH (ref 5–15)
BUN: 41 mg/dL — AB (ref 6–20)
CO2: 28 mmol/L (ref 22–32)
Calcium: 8.5 mg/dL — ABNORMAL LOW (ref 8.9–10.3)
Chloride: 93 mmol/L — ABNORMAL LOW (ref 101–111)
Creatinine, Ser: 1.71 mg/dL — ABNORMAL HIGH (ref 0.44–1.00)
GFR calc Af Amer: 40 mL/min — ABNORMAL LOW (ref >60)
GFR, EST NON AFRICAN AMERICAN: 35 mL/min — AB (ref >60)
GLUCOSE: 128 mg/dL — AB (ref 65–99)
POTASSIUM: 3.9 mmol/L (ref 3.5–5.1)
Sodium: 137 mmol/L (ref 135–145)

## 2017-09-15 LAB — TROPONIN I: Troponin I: 0.18 ng/mL (ref <0.03)

## 2017-09-15 LAB — MAGNESIUM: Magnesium: 2.9 mg/dL — ABNORMAL HIGH (ref 1.7–2.4)

## 2017-09-15 LAB — HEPARIN LEVEL (UNFRACTIONATED)
Heparin Unfractionated: <0.1 IU/mL — ABNORMAL LOW (ref 0.30–0.70)
Heparin Unfractionated: 0.13 IU/mL — ABNORMAL LOW (ref 0.30–0.70)
Heparin Unfractionated: 0.36 IU/mL (ref 0.30–0.70)

## 2017-09-15 LAB — HIV ANTIBODY (ROUTINE TESTING W REFLEX): HIV SCREEN 4TH GENERATION: NONREACTIVE

## 2017-09-15 LAB — GLUCOSE, CAPILLARY
GLUCOSE-CAPILLARY: 114 mg/dL — AB (ref 65–99)
GLUCOSE-CAPILLARY: 230 mg/dL — AB (ref 65–99)
Glucose-Capillary: 133 mg/dL — ABNORMAL HIGH (ref 65–99)
Glucose-Capillary: 177 mg/dL — ABNORMAL HIGH (ref 65–99)

## 2017-09-15 LAB — EXPECTORATED SPUTUM ASSESSMENT W GRAM STAIN, RFLX TO RESP C

## 2017-09-15 LAB — EXPECTORATED SPUTUM ASSESSMENT W REFEX TO RESP CULTURE

## 2017-09-15 MED ORDER — HEPARIN BOLUS VIA INFUSION
2000.0000 [IU] | Freq: Once | INTRAVENOUS | Status: AC
  Administered 2017-09-15: 2000 [IU] via INTRAVENOUS
  Filled 2017-09-15: qty 2000

## 2017-09-15 MED ORDER — SIMETHICONE 80 MG PO CHEW
80.0000 mg | CHEWABLE_TABLET | Freq: Four times a day (QID) | ORAL | Status: DC | PRN
  Administered 2017-09-15 – 2017-09-16 (×3): 80 mg via ORAL
  Filled 2017-09-15 (×4): qty 1

## 2017-09-15 MED ORDER — OSELTAMIVIR PHOSPHATE 30 MG PO CAPS
30.0000 mg | ORAL_CAPSULE | Freq: Two times a day (BID) | ORAL | Status: AC
  Administered 2017-09-15 – 2017-09-19 (×8): 30 mg via ORAL
  Filled 2017-09-15 (×9): qty 1

## 2017-09-15 MED ORDER — DM-GUAIFENESIN ER 30-600 MG PO TB12
1.0000 | ORAL_TABLET | Freq: Two times a day (BID) | ORAL | Status: DC
  Administered 2017-09-15 – 2017-09-17 (×5): 1 via ORAL
  Filled 2017-09-15 (×9): qty 1

## 2017-09-15 MED ORDER — METHYLPREDNISOLONE SODIUM SUCC 125 MG IJ SOLR
60.0000 mg | Freq: Three times a day (TID) | INTRAMUSCULAR | Status: DC
  Administered 2017-09-15 – 2017-09-19 (×12): 60 mg via INTRAVENOUS
  Filled 2017-09-15 (×12): qty 2

## 2017-09-15 MED ORDER — ALBUTEROL SULFATE (2.5 MG/3ML) 0.083% IN NEBU
2.5000 mg | INHALATION_SOLUTION | RESPIRATORY_TRACT | Status: DC | PRN
  Administered 2017-09-16 – 2017-09-17 (×3): 2.5 mg via RESPIRATORY_TRACT
  Filled 2017-09-15 (×3): qty 3

## 2017-09-15 MED ORDER — HYDROCODONE-ACETAMINOPHEN 7.5-325 MG PO TABS: 1.0000 | ORAL_TABLET | Freq: Four times a day (QID) | ORAL | Status: DC | PRN

## 2017-09-15 MED ORDER — BENZONATATE 100 MG PO CAPS
200.0000 mg | ORAL_CAPSULE | Freq: Three times a day (TID) | ORAL | Status: DC | PRN
  Administered 2017-09-16 – 2017-09-19 (×6): 200 mg via ORAL
  Filled 2017-09-15 (×5): qty 2

## 2017-09-15 MED ORDER — HEPARIN (PORCINE) IN NACL 100-0.45 UNIT/ML-% IJ SOLN
1400.0000 [IU]/h | INTRAMUSCULAR | Status: DC
  Administered 2017-09-15: 850 [IU]/h via INTRAVENOUS
  Administered 2017-09-15: 1150 [IU]/h via INTRAVENOUS
  Administered 2017-09-16 (×2): 1550 [IU]/h via INTRAVENOUS
  Administered 2017-09-16 – 2017-09-18 (×3): 1400 [IU]/h via INTRAVENOUS
  Filled 2017-09-15 (×5): qty 250

## 2017-09-15 NOTE — Progress Notes (Addendum)
ANTICOAGULATION CONSULT NOTE - Initial Consult  Pharmacy Consult for heparin Indication: chest pain/ACS  Allergies  Allergen Reactions  . Iodine Hives and Itching  . Shellfish Allergy Hives and Itching    Patient Measurements: Height: 5\' 1"  (154.9 cm) Weight: 237 lb 3.4 oz (107.6 kg) IBW/kg (Calculated) : 47.8 Heparin Dosing Weight: 74 kg  Vital Signs: Temp: 98.8 F (37.1 C) (03/08 2020) Temp Source: Oral (03/08 2020) BP: 153/82 (03/08 2020) Pulse Rate: 93 (03/08 2105)  Labs: Recent Labs    09/14/17 1349 09/14/17 1936 09/15/17 0131  HGB 13.3  --   --   HCT 41.4  --   --   PLT 253  --   --   LABPROT 12.7  --   --   INR 0.96  --   --   CREATININE 1.45*  --  1.71*  TROPONINI 0.21* 0.20* 0.18*    Medical History: Past Medical History:  Diagnosis Date  . Acute combined systolic and diastolic CHF, NYHA class 3 (HCC)   . Bronchitis due to tobacco use (HCC) 08/2017  . Chronic respiratory failure (HCC)   . COPD (chronic obstructive pulmonary disease) (HCC)   . Diastolic CHF (HCC)   . Obesity hypoventilation syndrome (HCC)   . Pneumonia 08/2017  . Systolic CHF with reduced left ventricular function, NYHA class 3 (HCC) 09/13/2017    Assessment: 47 yo female with chest pain. She initially went to an outside hospital and was transferred here for a cardiac cath. She was started on heparin at 850 units/hr at outside hospital - I will continue this and get a level as I anticipate this is a subtherapeutic rate. CBC ok, SCr 1.4 > 1.7 ml/min.   Goal of Therapy:  Heparin level 0.3-0.7 units/ml Monitor platelets by anticoagulation protocol: Yes    Plan:  -Heparin infusion at 850 units/hr -Check HL now -Daily HL, CBC   Yatziry Deakins, 57 09/15/2017,2:36 AM  Addendum -Heparin level is undetectable -Bolus heparin 2000 units x1 and increase to 1150 units/hr -Daily HL, CBC -Check again in 6 hours  11/15/2017 09/15/2017 5:18 AM

## 2017-09-15 NOTE — Progress Notes (Signed)
Pt C/O chest pain mid chest ,but after we got her up to the side of her bed, she started belching with relief of the discomfort. Pt is anxious at this time but is refusing any more anxiety meds, text paged MD for pain meds prn and for some Mylicon for gas pain, will continue to monitor.

## 2017-09-15 NOTE — Progress Notes (Signed)
Progress Note  Patient Name: Klare Criss Date of Encounter: 09/15/2017  Primary Cardiologist: New,   Subjective   Janelys Glassner is a 47 y.o. female with a hx of COPD on home O2, morbid obesity, acute on chronic systolic and diastolic heart failure (per echo 09/13/2017 with new EF 30-35% with akinetic anterior wall concerning for Takotsubo cardiomyopathy), hypothyroidism and OSA who is being seen today for the evaluation of newly diagnosed symptomatic systolic heart failure and elevated troponin levels at the request of Dr. Jerral Ralph from [facility]  I saw Josefina last night.  She was in moderate respiratory distress with significant wheezes.  She has been found to have influenza A.  Discussed the case with Dr. Waymon Amato.   Renal function has deteriorated slightly.  Lasix and lisinopril are currently on hold.  Inpatient Medications    Scheduled Meds: . aspirin EC  81 mg Oral Daily  . carvedilol  3.125 mg Oral BID WC  . dextromethorphan-guaiFENesin  1 tablet Oral BID  . doxycycline  100 mg Oral Q12H  . ipratropium-albuterol  3 mL Nebulization QID  . methylPREDNISolone (SOLU-MEDROL) injection  60 mg Intravenous Q8H  . mometasone-formoterol  2 puff Inhalation BID  . nicotine  14 mg Transdermal Daily  . oseltamivir  75 mg Oral BID  . sodium chloride flush  3 mL Intravenous Q12H   Continuous Infusions: . sodium chloride    . heparin 1,150 Units/hr (09/15/17 0529)   PRN Meds: sodium chloride, acetaminophen, albuterol, ALPRAZolam, ALPRAZolam, benzonatate, HYDROcodone-acetaminophen, ondansetron (ZOFRAN) IV, simethicone, sodium chloride flush   Vital Signs    Vitals:   09/15/17 0415 09/15/17 0737 09/15/17 0811 09/15/17 0918  BP: (!) 151/93 (!) 151/77 (!) 144/79   Pulse: 85 72    Resp: 20 17    Temp: 97.9 F (36.6 C)     TempSrc: Oral     SpO2: 95% 94%  92%  Weight: 232 lb (105.2 kg)     Height:        Intake/Output Summary (Last 24 hours) at 09/15/2017  0939 Last data filed at 09/15/2017 0600 Gross per 24 hour  Intake 101.25 ml  Output 2750 ml  Net -2648.75 ml   Filed Weights   09/14/17 1600 09/15/17 0415  Weight: 237 lb 3.4 oz (107.6 kg) 232 lb (105.2 kg)    Telemetry    NSR  - Personally Reviewed  ECG     NSR  - Personally Reviewed  Physical Exam   GEN: morbidly obese , middle age female, mild resp. Distress.   Better than last night   Neck: No JVD Cardiac: RRR, no murmurs, rubs, or gallops.  Respiratory: tight wheezes.  But improved from last night. GI:  Morbidly obese.  Nontender.  Good bowel sounds. MS: No edema; No deformity. Neuro:  Nonfocal  Psych: Normal affect   Labs    Chemistry Recent Labs  Lab 09/14/17 1349 09/15/17 0131  NA 138 137  K 4.0 3.9  CL 96* 93*  CO2 26 28  GLUCOSE 146* 128*  BUN 26* 41*  CREATININE 1.45* 1.71*  CALCIUM 8.7* 8.5*  PROT 6.7  --   ALBUMIN 3.2*  --   AST 22  --   ALT 23  --   ALKPHOS 84  --   BILITOT 0.8  --   GFRNONAA 42* 35*  GFRAA 49* 40*  ANIONGAP 16* 16*     Hematology Recent Labs  Lab 09/14/17 1349  WBC 15.5*  RBC 4.62  HGB  13.3  HCT 41.4  MCV 89.6  MCH 28.8  MCHC 32.1  RDW 15.5  PLT 253    Cardiac Enzymes Recent Labs  Lab 09/14/17 1349 09/14/17 1936 09/15/17 0131  TROPONINI 0.21* 0.20* 0.18*   No results for input(s): TROPIPOC in the last 168 hours.   BNP Recent Labs  Lab 09/14/17 1333  BNP 501.7*     DDimer No results for input(s): DDIMER in the last 168 hours.   Radiology    Dg Chest Port 1 View  Result Date: 09/15/2017 CLINICAL DATA:  Shortness of breath.  CHF. EXAM: PORTABLE CHEST 1 VIEW COMPARISON:  None. FINDINGS: The heart size borderline. The hila and mediastinum are normal. No pneumothorax. No nodules or masses. No overt edema. Mild atelectasis in the bases. IMPRESSION: No active disease. Electronically Signed   By: Gerome Sam III M.D   On: 09/15/2017 08:11    Cardiac Studies     Patient Profile     47 y.o.  female with acute on chronic congestive heart failure.  Her ejection fraction is 30-35% -possibly related to Takotsubo  syndrome.  She was transferred from [facility] for heart catheterization.  She was found to have influenza A.  Assessment & Plan    1.  Acute on chronic combined systolic and diastolic congestive heart failure: The patient has severe COPD and is on home oxygen.  She has been found to have influenza A.  Echocardiogram at [facility] shows ejection fraction of 30-35%.  ? takotsubo syndrome vs. Ischemic heart disease.   Agree with the need for heart catheter station although she will have to improve from a respiratory status.  Continue Tamiflu and supportive care. We will need to hold her lisinopril and Lasix because her renal function has declined slightly.  Creatinine  today is 1.71. Troponin levels are mildly elevated-this is most likely due to demand ischemia in the setting of congestive heart failure.  The trend is relatively flat.  2.  Acute on chronic renal insufficiency: Agree with Dr. Waymon Amato that we should hold furosemide and lisinopril.  We will continue to follow.  For questions or updates, please contact [facility] Please consult [email or web address] for contact info under Cardiology/STEMI.      Signed, Kristeen Miss, MD  09/15/2017, 9:39 AM

## 2017-09-15 NOTE — Progress Notes (Signed)
PROGRESS NOTE   Katie Rasmussen  ZOX:[id number]    DOB: 26-Feb-1971    DOA: 09/14/2017  PCP: Patient, No Pcp Per   I have briefly reviewed patients previous medical records in [facility].  Brief Narrative:  47 year old married female, lives with her spouse and 5 children aged 17-24, ambulates with the help of a cane, PMH of COPD, nocturnal hypoxia/OHS/chronic hypoxic respiratory failure on 3 L/min at bedtime (waiting to see Dr. Williams Che, Pulmonology), morbid obesity, chronic combined CHF, tobacco abuse initially presented to [facility] with cough, dyspnea, subjective fevers, multiple family members sick with URTI symptoms, progressively got worse and drove herself to [facility] where she nearly passed out in the parking lot and was cyanotic, acute on chronic hypoxic and hypercapnic respiratory failure (pH of 7.1), tried on BiPAP with Lasix, antimicrobial therapy, liberated off BiPAP, noted elevated troponin, echo showed new reduced EF 30-35% with wall motion abnormalities with concern for Takotsubo cardiomyopathy and transferred to [facility] for cardiology consultation.  Ruled in for influenza A.  Cardiology consulted.   Assessment & Plan:   Principal Problem:   Acute combined systolic and diastolic CHF, NYHA class 3 (HCC) Active Problems:   Acute on chronic respiratory failure with hypoxia and hypercapnia (HCC)   Pneumonia   Obesity hypoventilation syndrome (HCC)   COPD (chronic obstructive pulmonary disease) (HCC)   Bronchitis due to tobacco use (HCC)   Acute on chronic hypoxic and hypercapnic respiratory failure: Likely precipitated by influenza A acute bronchitis causing COPD exacerbation and decompensated CHF complicating underlying OHS.  Initially admitted to [facility] ICU and was on BiPAP.  Now off of BiPAP and saturating in the low 90s on 4 L/min.  If worsens, may need transfer to ICU, CCM consult for intubation.  Chest x-ray 3/9 personally reviewed: No acute  findings.  Influenza A acute bronchitis/COPD exacerbation: RSV panel positive for influenza A H3.  Started Tamiflu.  Scheduled and as needed bronchodilator nebulizations.  Flutter valve.  Changed oral prednisone to IV Solu-Medrol 60 mg every 8 hourly.  Scheduled Mucinex DM and as needed Tessalon for cough.  Continue oral doxycycline for now.  Acute on chronic combined systolic and diastolic CHF: Cardiology consultation appreciated.  2D echo at [facility] showed EF 30- 35%.?  Takotsubo syndrome versus ischemic heart disease.  Treated with IV Lasix.  Currently appears euvolemic and due to increase in creatinine, Lasix discontinued.  Also held lisinopril.  Cardiology plans heart cath pending improvement in respiratory status.  Continue carvedilol.  Elevated troponin: Likely due to demand ischemia.  Flat trend.  No ischemic type of chest pain.  Continue IV heparin drip.  Cardiac cath when respiratory status stabilizes.  Acute on stage III chronic kidney disease: Creatinine has worsened from 1.45-1.71.  Discontinued Lasix and lisinopril.  Follow BMP in a.m.  OHS/nocturnal hypoxemia/chronic respiratory failure: Reports that she has never had a sleep study done.  Needs outpatient sleep study.  Hypertensive urgency: Blood pressures have improved.  Continue carvedilol 3.125 mg twice daily.  Discontinued lisinopril.  Hypothyroid: Continue Synthroid.  Morbid obesity/Body mass index is 43.84 kg/m.  Tobacco abuse: Cessation counseled.  Nicotine patch.    DVT prophylaxis: IV heparin Code Status: Full Family Communication: None at bedside Disposition: DC home pending clinical improvement and further evaluation.  May take several more days.   Consultants:  Cardiology  Procedures:  BiPAP, now off  Antimicrobials:  Doxycycline Tamiflu   Subjective: Ongoing cough with minimal white sputum.  Anterior chest pain only  on coughing.  Dyspnea and wheezing.  Multiple family members with URTI  symptoms over the last several days.  Breathing significantly better than when she initially came to [facility].  Smokes half pack per day until day of admission.  ROS: As above  Objective:  Vitals:   09/15/17 0918 09/15/17 1125 09/15/17 1242 09/15/17 1507  BP:  135/82    Pulse:  77    Resp:  (!) 25    Temp:  (!) 97.4 F (36.3 C)    TempSrc:  Axillary    SpO2: 92% 95% 94% 93%  Weight:      Height:        Examination:  General exam: Young female, moderately built and morbidly obese lying comfortably propped up in bed. Respiratory system: Reduced breath sounds bilaterally with bilateral medium pitched expiratory rhonchi and wheezing.  No stridor. Respiratory effort normal. Cardiovascular system: S1 & S2 heard, RRR. No JVD, murmurs, rubs, gallops or clicks. No pedal edema.  Telemetry personally reviewed: Sinus rhythm. Gastrointestinal system: Abdomen is nondistended, soft and nontender. No organomegaly or masses felt. Normal bowel sounds heard. Central nervous system: Alert and oriented. No focal neurological deficits. Extremities: Symmetric 5 x 5 power. Skin: No rashes, lesions or ulcers Psychiatry: Judgement and insight appear normal. Mood & affect appropriate.     Data Reviewed: I have personally reviewed following labs and imaging studies  CBC: Recent Labs  Lab 09/14/17 1349  WBC 15.5*  NEUTROABS 14.2*  HGB 13.3  HCT 41.4  MCV 89.6  PLT 253   Basic Metabolic Panel: Recent Labs  Lab 09/14/17 1349 09/15/17 0131  NA 138 137  K 4.0 3.9  CL 96* 93*  CO2 26 28  GLUCOSE 146* 128*  BUN 26* 41*  CREATININE 1.45* 1.71*  CALCIUM 8.7* 8.5*  MG 2.2 2.9*   Liver Function Tests: Recent Labs  Lab 09/14/17 1349  AST 22  ALT 23  ALKPHOS 84  BILITOT 0.8  PROT 6.7  ALBUMIN 3.2*   Coagulation Profile: Recent Labs  Lab 09/14/17 1349  INR 0.96   Cardiac Enzymes: Recent Labs  Lab 09/14/17 1349 09/14/17 1936 09/15/17 0131  TROPONINI 0.21* 0.20* 0.18*     HbA1C: No results for input(s): HGBA1C in the last 72 hours. CBG: Recent Labs  Lab 09/14/17 1201 09/14/17 1629 09/14/17 2142 09/15/17 0735 09/15/17 1123  GLUCAP 161* 131* 130* 114* 133*    Recent Results (from the past 240 hour(s))  Culture, expectorated sputum-assessment     Status: None   Collection Time: 09/14/17  5:49 AM  Result Value Ref Range Status   Specimen Description EXPECTORATED SPUTUM  Final   Special Requests   Final    NONE Performed at [facility], [address], [place], [place] [address]    Sputum evaluation THIS SPECIMEN IS ACCEPTABLE FOR SPUTUM CULTURE  Final   Report Status 09/15/2017 FINAL  Final  Culture, respiratory (NON-Expectorated)     Status: None (Preliminary result)   Collection Time: 09/14/17  5:49 AM  Result Value Ref Range Status   Specimen Description EXPECTORATED SPUTUM  Final   Special Requests NONE Reflexed from [id number]  Final   Gram Stain   Final    MODERATE WBC PRESENT, PREDOMINANTLY PMN RARE SQUAMOUS EPITHELIAL CELLS PRESENT RARE GRAM POSITIVE COCCI    Culture PENDING  Incomplete   Report Status PENDING  Incomplete  Respiratory Panel by PCR     Status: Abnormal   Collection Time: 09/14/17  1:33  PM  Result Value Ref Range Status   Adenovirus NOT DETECTED NOT DETECTED Final   Coronavirus 229E NOT DETECTED NOT DETECTED Final   Coronavirus HKU1 NOT DETECTED NOT DETECTED Final   Coronavirus NL63 NOT DETECTED NOT DETECTED Final   Coronavirus OC43 NOT DETECTED NOT DETECTED Final   Metapneumovirus NOT DETECTED NOT DETECTED Final   Rhinovirus / Enterovirus NOT DETECTED NOT DETECTED Final   Influenza A H3 DETECTED (A) NOT DETECTED Final   Influenza B NOT DETECTED NOT DETECTED Final   Parainfluenza Virus 1 NOT DETECTED NOT DETECTED Final   Parainfluenza Virus 2 NOT DETECTED NOT DETECTED Final   Parainfluenza Virus 3 NOT DETECTED NOT DETECTED Final   Parainfluenza Virus 4 NOT DETECTED NOT DETECTED Final   Respiratory Syncytial  Virus NOT DETECTED NOT DETECTED Final   Bordetella pertussis NOT DETECTED NOT DETECTED Final   Chlamydophila pneumoniae NOT DETECTED NOT DETECTED Final   Mycoplasma pneumoniae NOT DETECTED NOT DETECTED Final    Comment: Performed at [facility], [address], [place], [place] [address]  MRSA PCR Screening     Status: None   Collection Time: 09/14/17  1:33 PM  Result Value Ref Range Status   MRSA by PCR NEGATIVE NEGATIVE Final    Comment:        The GeneXpert MRSA Assay (FDA approved for NASAL specimens only), is one component of a comprehensive MRSA colonization surveillance program. It is not intended to diagnose MRSA infection nor to guide or monitor treatment for MRSA infections. Performed at [facility], [address], [place], [place] [address]          Radiology Studies: Dg Chest Port 1 View  Result Date: 09/15/2017 CLINICAL DATA:  Shortness of breath.  CHF. EXAM: PORTABLE CHEST 1 VIEW COMPARISON:  None. FINDINGS: The heart size borderline. The hila and mediastinum are normal. No pneumothorax. No nodules or masses. No overt edema. Mild atelectasis in the bases. IMPRESSION: No active disease. Electronically Signed   By: Gerome Sam III M.D   On: 09/15/2017 08:11        Scheduled Meds: . aspirin EC  81 mg Oral Daily  . carvedilol  3.125 mg Oral BID WC  . dextromethorphan-guaiFENesin  1 tablet Oral BID  . doxycycline  100 mg Oral Q12H  . ipratropium-albuterol  3 mL Nebulization QID  . methylPREDNISolone (SOLU-MEDROL) injection  60 mg Intravenous Q8H  . mometasone-formoterol  2 puff Inhalation BID  . nicotine  14 mg Transdermal Daily  . oseltamivir  30 mg Oral BID  . sodium chloride flush  3 mL Intravenous Q12H   Continuous Infusions: . sodium chloride    . heparin 1,300 Units/hr (09/15/17 1314)     LOS: 1 day     Marcellus Scott, MD, FACP, [facility]. [facility] Pager [phone]  If 7PM-7AM, please contact  night-coverage [email or web address] Password [facility] 09/15/2017, 3:19 PM

## 2017-09-15 NOTE — Progress Notes (Signed)
Pt stated that she currently uses 3 L of oxygen at home. She is currently on 4L with bipap PRN. Will continue to monitor.

## 2017-09-15 NOTE — Progress Notes (Signed)
ANTICOAGULATION CONSULT NOTE   Pharmacy Consult for heparin Indication: chest pain/ACS  Allergies  Allergen Reactions  . Iodine Hives and Itching  . Shellfish Allergy Hives and Itching    Patient Measurements: Height: 5\' 1"  (154.9 cm) Weight: 232 lb (105.2 kg) IBW/kg (Calculated) : 47.8 Heparin Dosing Weight: 74 kg  Vital Signs: Temp: 98.5 F (36.9 C) (03/09 1618) Temp Source: Oral (03/09 1618) BP: 152/88 (03/09 1720) Pulse Rate: 82 (03/09 1618)  Labs: Recent Labs    09/14/17 1349 09/14/17 1936 09/15/17 0131 09/15/17 0352 09/15/17 1055 09/15/17 1836  HGB 13.3  --   --   --   --   --   HCT 41.4  --   --   --   --   --   PLT 253  --   --   --   --   --   LABPROT 12.7  --   --   --   --   --   INR 0.96  --   --   --   --   --   HEPARINUNFRC  --   --   --  <0.10* 0.13* 0.36  CREATININE 1.45*  --  1.71*  --   --   --   TROPONINI 0.21* 0.20* 0.18*  --   --   --     Medical History: Past Medical History:  Diagnosis Date  . Acute combined systolic and diastolic CHF, NYHA class 3 (HCC)   . Bronchitis due to tobacco use (HCC) 08/2017  . Chronic respiratory failure (HCC)   . COPD (chronic obstructive pulmonary disease) (HCC)   . Diastolic CHF (HCC)   . Obesity hypoventilation syndrome (HCC)   . Pneumonia 08/2017  . Systolic CHF with reduced left ventricular function, NYHA class 3 (HCC) 09/13/2017    Assessment: 47 yo female with chest pain Heparin level = 0.36 - low end  Goal of Therapy:  Heparin level 0.3-0.7 units/ml Monitor platelets by anticoagulation protocol: Yes   Plan:  -Heparin to 1400 units / hr -Daily HL, CBC  Thank you 57, PharmD [phone]

## 2017-09-15 NOTE — Progress Notes (Signed)
Bodenheimer paged the following:   6E20: May I change precautions back to droplet precautions? MD placed airborne precautions for Flu.

## 2017-09-15 NOTE — Progress Notes (Signed)
PHARMACY NOTE:  ANTIMICROBIAL RENAL DOSAGE ADJUSTMENT  Current antimicrobial regimen includes a mismatch between antimicrobial dosage and estimated renal function.  As per policy approved by the Pharmacy & Therapeutics and Medical Executive Committees, the antimicrobial dosage will be adjusted accordingly.  Current antimicrobial dosage:  oseltamivir (Tamiflu) 75 mg po BID  Indication: Influenza  Renal Function: Scr elevated at 1.71  Estimated Creatinine Clearance: 45.5 mL/min (A) (by C-G formula based on SCr of 1.71 mg/dL (H)). []      On intermittent HD, scheduled: []      On CRRT     Antimicrobial dosage has been changed to:  oseltamivir (Tamiflu) 30 mg po BID  Additional comments:   , PharmD Pharmacy Resident Pager: [phone]

## 2017-09-15 NOTE — Progress Notes (Signed)
Nutrition Education Note  RD consulted for nutrition education regarding new onset CHF.  RD provided "Heart Failure Nutrition Therapy" handout from the Academy of [employer] and Dietetics. Family and spouse at bedside. Reviewed patient's dietary recall. Provided examples on ways to decrease sodium intake in diet. Discouraged intake of processed foods and use of salt shaker. Encouraged fresh fruits and vegetables as well as whole grain sources of carbohydrates to maximize fiber intake. Reviewed general, healthy diet including eliminating her intake of regular [facility] and encouraged weight loss  RD discussed why it is important for patient to adhere to diet recommendations, and emphasized the role of fluids, foods to avoid, and importance of weighing self daily. Teach back method used.  Expect good compliance.  Body mass index is 43.84 kg/m. Pt meets criteria for morbid obesity based on current BMI.  Current diet order is Heart Healthy/Carb Modified, patient is consuming approximately 35-100% of meals at this time; pt reports she does not like the hospital food but appetite is good. Labs and medications reviewed. No further nutrition interventions warranted at this time. RD contact information provided. If additional nutrition issues arise, please re-consult RD.   Romelle Starcher MS, RD, LDN, CNSC [phone] Pager  [phone] Weekend/On-Call Pager

## 2017-09-15 NOTE — Progress Notes (Signed)
ANTICOAGULATION CONSULT NOTE   Pharmacy Consult for heparin Indication: chest pain/ACS  Allergies  Allergen Reactions  . Iodine Hives and Itching  . Shellfish Allergy Hives and Itching    Patient Measurements: Height: 5\' 1"  (154.9 cm) Weight: 232 lb (105.2 kg) IBW/kg (Calculated) : 47.8 Heparin Dosing Weight: 74 kg  Vital Signs: Temp: 97.4 F (36.3 C) (03/09 1125) Temp Source: Axillary (03/09 1125) BP: 135/82 (03/09 1125) Pulse Rate: 77 (03/09 1125)  Labs: Recent Labs    09/14/17 1349 09/14/17 1936 09/15/17 0131 09/15/17 0352 09/15/17 1055  HGB 13.3  --   --   --   --   HCT 41.4  --   --   --   --   PLT 253  --   --   --   --   LABPROT 12.7  --   --   --   --   INR 0.96  --   --   --   --   HEPARINUNFRC  --   --   --  <0.10* 0.13*  CREATININE 1.45*  --  1.71*  --   --   TROPONINI 0.21* 0.20* 0.18*  --   --     Medical History: Past Medical History:  Diagnosis Date  . Acute combined systolic and diastolic CHF, NYHA class 3 (HCC)   . Bronchitis due to tobacco use (HCC) 08/2017  . Chronic respiratory failure (HCC)   . COPD (chronic obstructive pulmonary disease) (HCC)   . Diastolic CHF (HCC)   . Obesity hypoventilation syndrome (HCC)   . Pneumonia 08/2017  . Systolic CHF with reduced left ventricular function, NYHA class 3 (HCC) 09/13/2017    Assessment: 47 yo female with chest pain. She initially went to an outside hospital and was transferred here for a cardiac cath. She was started on heparin at 850 units/hr at [facility]. Heparin level was drawn here and came back at <0.10. She was given a heparin bolus of 2000 units x1 and gtt was increased to 1150 units/hr. The following heparin level remained subtherapeutic at 0.13. RN confirms no issues with the IV lines and no interruptions to therapy.   CBC of yesterday ok, no bleeding reported. Renal function worsening - Scr 1.45>1.71.   Goal of Therapy:  Heparin level 0.3-0.7 units/ml Monitor platelets by  anticoagulation protocol: Yes   Plan:  -Heparin bolus of 2000 units x 1 -Increase Heparin infusion to 1300 units/hr -Check HL in 6 hours -Daily HL, CBC  [facility], PharmD Pharmacy Resident Pager: [phone]

## 2017-09-16 DIAGNOSIS — N179 Acute kidney failure, unspecified: Secondary | ICD-10-CM

## 2017-09-16 DIAGNOSIS — Z720 Tobacco use: Secondary | ICD-10-CM

## 2017-09-16 DIAGNOSIS — N189 Chronic kidney disease, unspecified: Secondary | ICD-10-CM

## 2017-09-16 LAB — GLUCOSE, CAPILLARY
Glucose-Capillary: 155 mg/dL — ABNORMAL HIGH (ref 65–99)
Glucose-Capillary: 169 mg/dL — ABNORMAL HIGH (ref 65–99)
Glucose-Capillary: 162 mg/dL — ABNORMAL HIGH (ref 65–99)
Glucose-Capillary: 231 mg/dL — ABNORMAL HIGH (ref 65–99)

## 2017-09-16 LAB — CBC
HCT: 42.1 % (ref 36.0–46.0)
Hemoglobin: 13.4 g/dL (ref 12.0–15.0)
MCH: 28.8 pg (ref 26.0–34.0)
MCHC: 31.8 g/dL (ref 30.0–36.0)
MCV: 90.3 fL (ref 78.0–100.0)
PLATELETS: 231 10*3/uL (ref 150–400)
RBC: 4.66 MIL/uL (ref 3.87–5.11)
RDW: 15.4 % (ref 11.5–15.5)
WBC: 10 10*3/uL (ref 4.0–10.5)

## 2017-09-16 LAB — BASIC METABOLIC PANEL
Anion gap: 12 (ref 5–15)
BUN: 49 mg/dL — ABNORMAL HIGH (ref 6–20)
CALCIUM: 8.9 mg/dL (ref 8.9–10.3)
CO2: 28 mmol/L (ref 22–32)
CREATININE: 1.43 mg/dL — AB (ref 0.44–1.00)
Chloride: 97 mmol/L — ABNORMAL LOW (ref 101–111)
GFR calc Af Amer: 50 mL/min — ABNORMAL LOW (ref >60)
GFR calc non Af Amer: 43 mL/min — ABNORMAL LOW (ref >60)
GLUCOSE: 172 mg/dL — AB (ref 65–99)
Potassium: 4.7 mmol/L (ref 3.5–5.1)
Sodium: 137 mmol/L (ref 135–145)

## 2017-09-16 LAB — HEPARIN LEVEL (UNFRACTIONATED)
Heparin Unfractionated: 0.27 IU/mL — ABNORMAL LOW (ref 0.30–0.70)
Heparin Unfractionated: 0.64 IU/mL (ref 0.30–0.70)

## 2017-09-16 MED ORDER — MAGNESIUM HYDROXIDE 400 MG/5ML PO SUSP
30.0000 mL | Freq: Every evening | ORAL | Status: DC | PRN
  Administered 2017-09-16: 30 mL via ORAL
  Filled 2017-09-16: qty 30

## 2017-09-16 MED ORDER — ASPIRIN 81 MG PO CHEW
81.0000 mg | CHEWABLE_TABLET | ORAL | Status: AC
  Administered 2017-09-17: 81 mg via ORAL
  Filled 2017-09-16: qty 1

## 2017-09-16 MED ORDER — SODIUM CHLORIDE 0.9% FLUSH
3.0000 mL | Freq: Two times a day (BID) | INTRAVENOUS | Status: DC
  Administered 2017-09-16 – 2017-09-17 (×4): 3 mL via INTRAVENOUS

## 2017-09-16 MED ORDER — SODIUM CHLORIDE 0.9% FLUSH: 3.0000 mL | INTRAVENOUS | Status: DC | PRN

## 2017-09-16 MED ORDER — SODIUM CHLORIDE 0.9 % IV SOLN: 250.0000 mL | INTRAVENOUS | Status: DC | PRN

## 2017-09-16 MED ORDER — SODIUM CHLORIDE 0.9 % IV SOLN
INTRAVENOUS | Status: DC
  Administered 2017-09-17: 01:00:00 via INTRAVENOUS

## 2017-09-16 MED ORDER — LEVOTHYROXINE SODIUM 75 MCG PO TABS
175.0000 ug | ORAL_TABLET | Freq: Every day | ORAL | Status: DC
  Administered 2017-09-16 – 2017-09-20 (×5): 175 ug via ORAL
  Filled 2017-09-16 (×5): qty 1

## 2017-09-16 MED ORDER — NYSTATIN 100000 UNIT/ML MT SUSP
5.0000 mL | Freq: Four times a day (QID) | OROMUCOSAL | Status: DC
  Administered 2017-09-16 – 2017-09-20 (×13): 500000 [IU] via ORAL
  Filled 2017-09-16 (×17): qty 5

## 2017-09-16 NOTE — Progress Notes (Signed)
ANTICOAGULATION CONSULT NOTE  Pharmacy Consult for heparin Indication: chest pain/ACS  Allergies  Allergen Reactions  . Iodine Hives and Itching  . Shellfish Allergy Hives and Itching    Patient Measurements: Height: 5\' 1"  (154.9 cm) Weight: 231 lb 11.2 oz (105.1 kg) IBW/kg (Calculated) : 47.8 Heparin Dosing Weight: 74 kg  Vital Signs: Temp: 97.9 F (36.6 C) (03/10 0540) Temp Source: Oral (03/10 0540) BP: 164/81 (03/10 0540) Pulse Rate: 66 (03/10 0540)   Assessment: 47 yo female with chest pain. She initially went to an outside hospital and was transferred here for a cardiac cath. Planning cath when more stable from respiratory standpoint. Heparin level is still subtherapeutic.    Goal of Therapy:  Heparin level 0.3-0.7 units/ml Monitor platelets by anticoagulation protocol: Yes    Plan:  -Increase heparin to 1550 units/hr -Check HL in 6 hours -Daily HL, CBC   57 09/16/2017 6:36 AM

## 2017-09-16 NOTE — Progress Notes (Signed)
Spoke with patient regarding CPAP. Pt stated she did not wear last night and may not wear tonight because she isn't feeling well. RT instructed patient to call if she decided she wanted to wear. RN is aware. RT will continue to monitor.

## 2017-09-16 NOTE — Progress Notes (Signed)
Progress Note  Patient Name: Katie Rasmussen Date of Encounter: 09/16/2017  Primary Cardiologist: New,  Nahser  Subjective   Katie Rasmussen is a 47 y.o. female with a hx of COPD on home O2, morbid obesity, acute on chronic systolic and diastolic heart failure (per echo 09/13/2017 with new EF 30-35% with akinetic anterior wall concerning for Takotsubo cardiomyopathy), hypothyroidism and OSA who is being seen today for the evaluation of newly diagnosed symptomatic systolic heart failure and elevated troponin levels at the request of Dr. Jerral Ralph from [facility]  I saw Ahley last night.  She was in moderate respiratory distress with significant wheezes.  She has been found to have influenza A.  Discussed the case with Dr. Waymon Amato.   Renal function has deteriorated slightly.  Lasix and lisinopril are currently on hold.  Inpatient Medications    Scheduled Meds: . aspirin EC  81 mg Oral Daily  . carvedilol  3.125 mg Oral BID WC  . dextromethorphan-guaiFENesin  1 tablet Oral BID  . doxycycline  100 mg Oral Q12H  . ipratropium-albuterol  3 mL Nebulization QID  . methylPREDNISolone (SOLU-MEDROL) injection  60 mg Intravenous Q8H  . mometasone-formoterol  2 puff Inhalation BID  . nicotine  14 mg Transdermal Daily  . oseltamivir  30 mg Oral BID  . sodium chloride flush  3 mL Intravenous Q12H   Continuous Infusions: . sodium chloride    . heparin 1,550 Units/hr (09/16/17 3716)   PRN Meds: sodium chloride, acetaminophen, albuterol, ALPRAZolam, ALPRAZolam, benzonatate, HYDROcodone-acetaminophen, ondansetron (ZOFRAN) IV, simethicone, sodium chloride flush   Vital Signs    Vitals:   09/16/17 0540 09/16/17 0755 09/16/17 0818 09/16/17 0906  BP: (!) 164/81 (!) 163/82 (!) 164/89   Pulse: 66 75    Resp: (!) 27 20    Temp: 97.9 F (36.6 C) 97.6 F (36.4 C)    TempSrc: Oral Oral    SpO2:  97%  95%  Weight: 231 lb 11.2 oz (105.1 kg)     Height:        Intake/Output  Summary (Last 24 hours) at 09/16/2017 0931 Last data filed at 09/16/2017 0541 Gross per 24 hour  Intake 720 ml  Output 1150 ml  Net -430 ml   Filed Weights   09/14/17 1600 09/15/17 0415 09/16/17 0540  Weight: 237 lb 3.4 oz (107.6 kg) 232 lb (105.2 kg) 231 lb 11.2 oz (105.1 kg)    Telemetry    Sinus brady at 55   - Personally Reviewed  ECG     NSR  - Personally Reviewed  Physical Exam   Physical Exam: Blood pressure (!) 164/89, pulse 75, temperature 97.6 F (36.4 C), temperature source Oral, resp. rate 20, height 5\' 1"  (1.549 m), weight 231 lb 11.2 oz (105.1 kg), last menstrual period 09/15/2017, SpO2 95 %.  GEN:   Morbidly obese middle age female  HEENT: Normal NECK: No JVD; No carotid bruits LYMPHATICS: No lymphadenopathy CARDIAC: RR , distant heart sound s RESPIRATORY:  Bilateral tight wheezes - expecially with forced expiration  ABDOMEN: Soft, non-tender, non-distended MUSCULOSKELETAL:  No edema; No deformity  SKIN: Warm and dry NEUROLOGIC:  Alert and oriented x 3  Labs    Chemistry Recent Labs  Lab 09/14/17 1349 09/15/17 0131 09/16/17 0429  NA 138 137 137  K 4.0 3.9 4.7  CL 96* 93* 97*  CO2 26 28 28   GLUCOSE 146* 128* 172*  BUN 26* 41* 49*  CREATININE 1.45* 1.71* 1.43*  CALCIUM 8.7* 8.5* 8.9  PROT 6.7  --   --   ALBUMIN 3.2*  --   --   AST 22  --   --   ALT 23  --   --   ALKPHOS 84  --   --   BILITOT 0.8  --   --   GFRNONAA 42* 35* 43*  GFRAA 49* 40* 50*  ANIONGAP 16* 16* 12     Hematology Recent Labs  Lab 09/14/17 1349 09/16/17 0429  WBC 15.5* 10.0  RBC 4.62 4.66  HGB 13.3 13.4  HCT 41.4 42.1  MCV 89.6 90.3  MCH 28.8 28.8  MCHC 32.1 31.8  RDW 15.5 15.4  PLT 253 231    Cardiac Enzymes Recent Labs  Lab 09/14/17 1349 09/14/17 1936 09/15/17 0131  TROPONINI 0.21* 0.20* 0.18*   No results for input(s): TROPIPOC in the last 168 hours.   BNP Recent Labs  Lab 09/14/17 1333  BNP 501.7*     DDimer No results for input(s): DDIMER  in the last 168 hours.   Radiology    Dg Chest Port 1 View  Result Date: 09/15/2017 CLINICAL DATA:  Shortness of breath.  CHF. EXAM: PORTABLE CHEST 1 VIEW COMPARISON:  None. FINDINGS: The heart size borderline. The hila and mediastinum are normal. No pneumothorax. No nodules or masses. No overt edema. Mild atelectasis in the bases. IMPRESSION: No active disease. Electronically Signed   By: Gerome Sam III M.D   On: 09/15/2017 08:11    Cardiac Studies     Patient Profile     47 y.o. female with acute on chronic congestive heart failure.  Her ejection fraction is 30-35% -possibly related to Takotsubo  syndrome.  She was transferred from [facility] for heart catheterization.  She was found to have influenza A.  Assessment & Plan    1.  Acute on chronic combined systolic and diastolic congestive heart failure: The patient has severe COPD and is on home oxygen.  She has been found to have influenza A.  Echocardiogram at [facility] shows ejection fraction of 30-35%.  ? takotsubo syndrome vs. Ischemic heart disease.   She needs a heart catheterization at some point.  I would anticipate that her she could have a catheter on Tuesday or Wednesday.  She still fairly tight and has some degree of respiratory distress..  2.  Acute on chronic renal insufficiency:   Renal function has improved slightly.   For questions or updates, please contact [facility] Please consult [email or web address] for contact info under Cardiology/STEMI.      Signed, Kristeen Miss, MD  09/16/2017, 9:31 AM

## 2017-09-16 NOTE — Progress Notes (Signed)
PROGRESS NOTE   Katie Rasmussen  WUJ:[id number]    DOB: October 16, 1970    DOA: 09/14/2017  PCP: Patient, No Pcp Per   I have briefly reviewed patients previous medical records in [facility].  Brief Narrative:  47 year old married female, lives with her spouse and 5 children aged 17-24, ambulates with the help of a cane, PMH of COPD, nocturnal hypoxia/OHS/chronic hypoxic respiratory failure on 3 L/min at bedtime (waiting to see Dr. Williams Che, Pulmonology), morbid obesity, chronic combined CHF, tobacco abuse initially presented to [facility] with cough, dyspnea, subjective fevers, multiple family members sick with URTI symptoms, progressively got worse and drove herself to [facility] where she nearly passed out in the parking lot and was cyanotic, acute on chronic hypoxic and hypercapnic respiratory failure (pH of 7.1), tried on BiPAP with Lasix, antimicrobial therapy, liberated off BiPAP, noted elevated troponin, echo showed new reduced EF 30-35% with wall motion abnormalities with concern for Takotsubo cardiomyopathy and transferred to [facility] for cardiology consultation.  Ruled in for influenza A.  Cardiology consulted.  Improving.   Assessment & Plan:   Principal Problem:   Acute combined systolic and diastolic CHF, NYHA class 3 (HCC) Active Problems:   Acute on chronic respiratory failure with hypoxia and hypercapnia (HCC)   Pneumonia   Obesity hypoventilation syndrome (HCC)   COPD (chronic obstructive pulmonary disease) (HCC)   Bronchitis due to tobacco use (HCC)   Acute on chronic hypoxic and hypercapnic respiratory failure: Likely precipitated by influenza A acute bronchitis causing COPD exacerbation and decompensated CHF complicating underlying OHS.  Initially admitted to [facility] ICU and was on BiPAP.  Now off of BiPAP and saturating 95% on 4 L/min.  If worsens, may need transfer to ICU, CCM consult for intubation.  Chest x-ray 3/9 personally reviewed: No acute  findings.  Improving.  Wean oxygen to saturation 88-92%.  Influenza A acute bronchitis/COPD exacerbation: RSV panel positive for influenza A H3.  Started Tamiflu.  Scheduled and as needed bronchodilator nebulizations.  Flutter valve.  Changed oral prednisone to IV Solu-Medrol 60 mg every 8 hourly.  Scheduled Mucinex DM and as needed Tessalon for cough.  Continue oral doxycycline for now.  Improving.?  Prednisone dependent PTA.  Acute on chronic combined systolic and diastolic CHF: Cardiology consultation appreciated.  2D echo at [facility] showed EF 30- 35%.?  Takotsubo syndrome versus ischemic heart disease.  Treated with IV Lasix.  Currently appears euvolemic and due to increase in creatinine, Lasix discontinued.  Also held lisinopril.  Cardiology plans heart cath pending improvement in respiratory status.  Continue carvedilol.  Clinically euvolemic.  Continue to hold Lasix for today.  Discussed with Dr. Elease Hashimoto, may consider cardiac cath 3/12 for 3/13 pending stabilization of respiratory status.  Elevated troponin: Likely due to demand ischemia.  Flat trend.  No ischemic type of chest pain.  Continue IV heparin drip.  Cardiac cath when respiratory status stabilizes.  Acute on stage III chronic kidney disease: Creatinine has worsened from 1.45-1.71.  Discontinued Lasix and lisinopril.  Creatinine has improved to 1.4.  Follow BMP daily.  OHS/nocturnal hypoxemia/chronic respiratory failure: Reports that she has never had a sleep study done.  Needs outpatient sleep study.  Hypertensive urgency: Blood pressures have improved.  Continue carvedilol 3.125 mg twice daily.  Discontinued lisinopril due to worsening creatinine.  Mildly uncontrolled.  Hypothyroid: Continue Synthroid.  Morbid obesity/Body mass index is 43.78 kg/m.  Tobacco abuse: Cessation counseled.  Nicotine patch.  Hyperglycemia: Likely due to steroids.  Check  A1c and SSI as needed.    DVT prophylaxis: IV heparin Code Status:  Full Family Communication: None at bedside Disposition: DC home pending clinical improvement and further evaluation.  May take several more days.   Consultants:  Cardiology  Procedures:  BiPAP, now off  Antimicrobials:  Doxycycline Tamiflu   Subjective: Overall feels much better.  Dyspnea significantly improved.  Cough and phlegm is now breaking up, white colored.  No chest pain reported.  ROS: As above  Objective:  Vitals:   09/16/17 0540 09/16/17 0755 09/16/17 0818 09/16/17 0906  BP: (!) 164/81 (!) 163/82 (!) 164/89   Pulse: 66 75    Resp: (!) 27 20    Temp: 97.9 F (36.6 C) 97.6 F (36.4 C)    TempSrc: Oral Oral    SpO2:  97%  95%  Weight: 105.1 kg (231 lb 11.2 oz)     Height:        Examination:  General exam: Young female, moderately built and morbidly obese lying comfortably propped up in bed. Respiratory system: Improved breath sounds bilaterally.  Still has a few scattered bilateral expiratory rhonchi.  No crackles. Cardiovascular system: S1 & S2 heard, RRR. No JVD, murmurs, rubs, gallops or clicks. No pedal edema.  Telemetry personally reviewed: Sinus rhythm.  Stable without change. Gastrointestinal system: Abdomen is nondistended, soft and nontender. No organomegaly or masses felt. Normal bowel sounds heard.  Stable without change. Central nervous system: Alert and oriented. No focal neurological deficits.  Stable without change. Extremities: Symmetric 5 x 5 power. Skin: No rashes, lesions or ulcers Psychiatry: Judgement and insight appear normal. Mood & affect appropriate.     Data Reviewed: I have personally reviewed following labs and imaging studies  CBC: Recent Labs  Lab 09/14/17 1349 09/16/17 0429  WBC 15.5* 10.0  NEUTROABS 14.2*  --   HGB 13.3 13.4  HCT 41.4 42.1  MCV 89.6 90.3  PLT 253 231   Basic Metabolic Panel: Recent Labs  Lab 09/14/17 1349 09/15/17 0131 09/16/17 0429  NA 138 137 137  K 4.0 3.9 4.7  CL 96* 93* 97*  CO2 26  28 28   GLUCOSE 146* 128* 172*  BUN 26* 41* 49*  CREATININE 1.45* 1.71* 1.43*  CALCIUM 8.7* 8.5* 8.9  MG 2.2 2.9*  --    Liver Function Tests: Recent Labs  Lab 09/14/17 1349  AST 22  ALT 23  ALKPHOS 84  BILITOT 0.8  PROT 6.7  ALBUMIN 3.2*   Coagulation Profile: Recent Labs  Lab 09/14/17 1349  INR 0.96   Cardiac Enzymes: Recent Labs  Lab 09/14/17 1349 09/14/17 1936 09/15/17 0131  TROPONINI 0.21* 0.20* 0.18*   HbA1C: No results for input(s): HGBA1C in the last 72 hours. CBG: Recent Labs  Lab 09/15/17 1123 09/15/17 1614 09/15/17 2043 09/16/17 0753 09/16/17 1107  GLUCAP 133* 177* 230* 155* 169*    Recent Results (from the past 240 hour(s))  Culture, expectorated sputum-assessment     Status: None   Collection Time: 09/14/17  5:49 AM  Result Value Ref Range Status   Specimen Description EXPECTORATED SPUTUM  Final   Special Requests   Final    NONE Performed at [facility], [address], [place], [place]    Sputum evaluation THIS SPECIMEN IS ACCEPTABLE FOR SPUTUM CULTURE  Final   Report Status 09/15/2017 FINAL  Final  Culture, respiratory (NON-Expectorated)     Status: None (Preliminary result)   Collection Time: 09/14/17  5:49 AM  Result Value Ref Range Status   Specimen Description EXPECTORATED SPUTUM  Final   Special Requests NONE Reflexed from [id number]  Final   Gram Stain   Final    MODERATE WBC PRESENT, PREDOMINANTLY PMN RARE SQUAMOUS EPITHELIAL CELLS PRESENT RARE GRAM POSITIVE COCCI    Culture   Final    CULTURE REINCUBATED FOR BETTER GROWTH Performed at [facility], [address] [place], [place] [address]    Report Status PENDING  Incomplete  Respiratory Panel by PCR     Status: Abnormal   Collection Time: 09/14/17  1:33 PM  Result Value Ref Range Status   Adenovirus NOT DETECTED NOT DETECTED Final   Coronavirus 229E NOT DETECTED NOT DETECTED Final   Coronavirus HKU1 NOT DETECTED NOT DETECTED Final   Coronavirus  NL63 NOT DETECTED NOT DETECTED Final   Coronavirus OC43 NOT DETECTED NOT DETECTED Final   Metapneumovirus NOT DETECTED NOT DETECTED Final   Rhinovirus / Enterovirus NOT DETECTED NOT DETECTED Final   Influenza A H3 DETECTED (A) NOT DETECTED Final   Influenza B NOT DETECTED NOT DETECTED Final   Parainfluenza Virus 1 NOT DETECTED NOT DETECTED Final   Parainfluenza Virus 2 NOT DETECTED NOT DETECTED Final   Parainfluenza Virus 3 NOT DETECTED NOT DETECTED Final   Parainfluenza Virus 4 NOT DETECTED NOT DETECTED Final   Respiratory Syncytial Virus NOT DETECTED NOT DETECTED Final   Bordetella pertussis NOT DETECTED NOT DETECTED Final   Chlamydophila pneumoniae NOT DETECTED NOT DETECTED Final   Mycoplasma pneumoniae NOT DETECTED NOT DETECTED Final    Comment: Performed at [facility], [address] [place], [place] [address]  MRSA PCR Screening     Status: None   Collection Time: 09/14/17  1:33 PM  Result Value Ref Range Status   MRSA by PCR NEGATIVE NEGATIVE Final    Comment:        The GeneXpert MRSA Assay (FDA approved for NASAL specimens only), is one component of a comprehensive MRSA colonization surveillance program. It is not intended to diagnose MRSA infection nor to guide or monitor treatment for MRSA infections. Performed at [facility], [address] [place], [place] [address]          Radiology Studies: Dg Chest Port 1 View  Result Date: 09/15/2017 CLINICAL DATA:  Shortness of breath.  CHF. EXAM: PORTABLE CHEST 1 VIEW COMPARISON:  None. FINDINGS: The heart size borderline. The hila and mediastinum are normal. No pneumothorax. No nodules or masses. No overt edema. Mild atelectasis in the bases. IMPRESSION: No active disease. Electronically Signed   By: Gerome Sam III M.D   On: 09/15/2017 08:11        Scheduled Meds: . aspirin EC  81 mg Oral Daily  . carvedilol  3.125 mg Oral BID WC  . dextromethorphan-guaiFENesin  1 tablet Oral BID  .  doxycycline  100 mg Oral Q12H  . ipratropium-albuterol  3 mL Nebulization QID  . levothyroxine  175 mcg Oral QAC breakfast  . methylPREDNISolone (SOLU-MEDROL) injection  60 mg Intravenous Q8H  . mometasone-formoterol  2 puff Inhalation BID  . nicotine  14 mg Transdermal Daily  . oseltamivir  30 mg Oral BID  . sodium chloride flush  3 mL Intravenous Q12H   Continuous Infusions: . sodium chloride    . heparin 1,550 Units/hr (09/16/17 4650)     LOS: 2 days     Marcellus Scott, MD, FACP, [facility]. [facility] Pager [phone]  If  7PM-7AM, please contact night-coverage [email or web address] Password [email or web address] 09/16/2017, 11:51 AM

## 2017-09-16 NOTE — Progress Notes (Signed)
ANTICOAGULATION CONSULT NOTE  Pharmacy Consult for heparin Indication: chest pain/ACS  Allergies  Allergen Reactions  . Iodine Hives and Itching  . Shellfish Allergy Hives and Itching    Patient Measurements: Height: 5\' 1"  (154.9 cm) Weight: 231 lb 11.2 oz (105.1 kg) IBW/kg (Calculated) : 47.8 Heparin Dosing Weight: 74 kg  Vital Signs: Temp: 97.5 F (36.4 C) (03/10 1153) Temp Source: Oral (03/10 1153) BP: 176/84 (03/10 1153) Pulse Rate: 66 (03/10 1153)   Assessment: 47 yo female with chest pain. She initially went to an outside hospital and was transferred here for a cardiac cath. Planning cath when more stable from respiratory standpoint.   Heparin level is therapeutic at 0.64. CBC wnl, no bleeding reported. Renal function slightly improved - CrCl ~50-55 ml/min  Goal of Therapy:  Heparin level 0.3-0.7 units/ml Monitor platelets by anticoagulation protocol: Yes   Plan:  -Continue heparin IV 1550 units/hr -Daily HL and CBC  57, PharmD Pharmacy Resident Pager: [phone]

## 2017-09-17 ENCOUNTER — Inpatient Hospital Stay (HOSPITAL_COMMUNITY): Payer: BLUE CROSS/BLUE SHIELD

## 2017-09-17 DIAGNOSIS — I420 Dilated cardiomyopathy: Secondary | ICD-10-CM

## 2017-09-17 DIAGNOSIS — R7989 Other specified abnormal findings of blood chemistry: Secondary | ICD-10-CM

## 2017-09-17 DIAGNOSIS — R778 Other specified abnormalities of plasma proteins: Secondary | ICD-10-CM

## 2017-09-17 LAB — BASIC METABOLIC PANEL
Anion gap: 9 (ref 5–15)
BUN: 41 mg/dL — AB (ref 6–20)
CHLORIDE: 100 mmol/L — AB (ref 101–111)
CO2: 28 mmol/L (ref 22–32)
CREATININE: 1.25 mg/dL — AB (ref 0.44–1.00)
Calcium: 9 mg/dL (ref 8.9–10.3)
GFR, EST AFRICAN AMERICAN: 58 mL/min — AB (ref >60)
GFR, EST NON AFRICAN AMERICAN: 50 mL/min — AB (ref >60)
Glucose, Bld: 152 mg/dL — ABNORMAL HIGH (ref 65–99)
POTASSIUM: 4.6 mmol/L (ref 3.5–5.1)
Sodium: 137 mmol/L (ref 135–145)

## 2017-09-17 LAB — CBC
HCT: 43.1 % (ref 36.0–46.0)
HEMOGLOBIN: 13.5 g/dL (ref 12.0–15.0)
MCH: 28.7 pg (ref 26.0–34.0)
MCHC: 31.3 g/dL (ref 30.0–36.0)
MCV: 91.5 fL (ref 78.0–100.0)
Platelets: 224 10*3/uL (ref 150–400)
RBC: 4.71 MIL/uL (ref 3.87–5.11)
RDW: 15 % (ref 11.5–15.5)
WBC: 12.2 10*3/uL — ABNORMAL HIGH (ref 4.0–10.5)

## 2017-09-17 LAB — CULTURE, RESPIRATORY: CULTURE: NORMAL

## 2017-09-17 LAB — GLUCOSE, CAPILLARY
GLUCOSE-CAPILLARY: 251 mg/dL — AB (ref 65–99)
Glucose-Capillary: 138 mg/dL — ABNORMAL HIGH (ref 65–99)
Glucose-Capillary: 195 mg/dL — ABNORMAL HIGH (ref 65–99)

## 2017-09-17 LAB — PROTIME-INR
INR: 1
PROTHROMBIN TIME: 13.1 s (ref 11.4–15.2)

## 2017-09-17 LAB — CULTURE, RESPIRATORY W GRAM STAIN

## 2017-09-17 LAB — HEPARIN LEVEL (UNFRACTIONATED)
HEPARIN UNFRACTIONATED: 0.93 [IU]/mL — AB (ref 0.30–0.70)
Heparin Unfractionated: 0.66 IU/mL (ref 0.30–0.70)

## 2017-09-17 MED ORDER — HYDRALAZINE HCL 20 MG/ML IJ SOLN
10.0000 mg | Freq: Four times a day (QID) | INTRAMUSCULAR | Status: DC | PRN
  Administered 2017-09-17 – 2017-09-19 (×4): 10 mg via INTRAVENOUS
  Filled 2017-09-17 (×4): qty 1

## 2017-09-17 MED ORDER — CARVEDILOL 3.125 MG PO TABS
6.2500 mg | ORAL_TABLET | Freq: Two times a day (BID) | ORAL | Status: DC
  Filled 2017-09-17: qty 2

## 2017-09-17 MED ORDER — FLUTICASONE PROPIONATE 50 MCG/ACT NA SUSP
2.0000 | Freq: Every day | NASAL | Status: DC
  Administered 2017-09-19 – 2017-09-20 (×2): 2 via NASAL
  Filled 2017-09-17 (×2): qty 16

## 2017-09-17 MED ORDER — ATORVASTATIN CALCIUM 80 MG PO TABS
80.0000 mg | ORAL_TABLET | Freq: Every day | ORAL | Status: DC
  Administered 2017-09-17 – 2017-09-19 (×3): 80 mg via ORAL
  Filled 2017-09-17 (×3): qty 1

## 2017-09-17 NOTE — Care Management Note (Signed)
Case Management Note  Patient Details  Name: Katie Rasmussen MRN: [id number] Date of Birth: [birth year]  Subjective/Objective: Pt presented for SOB-CHF and hx of COPD. PTA Independent from home. Pt has DME 02 at 2.5-3 Liters qhs via [employer]. Pt states she still works. Pt listed as self pay-insurance card copied and sent to Admissions. Primary Care Physician is Dr. Tomasa Blase with [facility]. Pt states she gets her medications appropriately.                     Action/Plan: No home needs identified at this time. CM will continue to follow.   Expected Discharge Date:                  Expected Discharge Plan:  Home/Self Care  In-House Referral:  NA  Discharge planning Services  CM Consult  Post Acute Care Choice:  NA Choice offered to:  NA  DME Arranged:  N/A DME Agency:  NA  HH Arranged:  NA HH Agency:  NA  Status of Service:  Completed, signed off  If discussed at Long Length of Stay Meetings, dates discussed:    Additional Comments:  Gala Lewandowsky, RN 09/17/2017, 12:44 PM

## 2017-09-17 NOTE — Progress Notes (Signed)
ANTICOAGULATION CONSULT NOTE - Follow Up Consult  Pharmacy Consult for heparin Indication: chest pain/ACS  Labs: Recent Labs    09/14/17 1349 09/14/17 1936 09/15/17 0131  09/16/17 0429  09/16/17 1225 09/17/17 0417 09/17/17 1213  HGB 13.3  --   --   --  13.4  --   --  13.5  --   HCT 41.4  --   --   --  42.1  --   --  43.1  --   PLT 253  --   --   --  231  --   --  224  --   LABPROT 12.7  --   --   --   --   --   --  13.1  --   INR 0.96  --   --   --   --   --   --  1.00  --   HEPARINUNFRC  --   --   --    < >  --    < > 0.64 0.93* 0.66  CREATININE 1.45*  --  1.71*  --  1.43*  --   --  1.25*  --   TROPONINI 0.21* 0.20* 0.18*  --   --   --   --   --   --    < > = values in this interval not displayed.    Assessment: 47yo female on IV heparin infusion for chest pain. Elevated troponin.  Heparin level = 0.66, therapeutic after heparin rate reduced to 1400 units/hr. CBC wnl.  No signs of bleeding noted except menses. Cardiology is planning for cardiac cath when respiratory status stabilizes.   Goal of Therapy:  Heparin level 0.3-0.7 units/ml   Plan:  Continue IV heparin gtt at 1400 units/hr Daily heparin level and CBC  Noah Delaine, RPh Clinical Pharmacist Pager: [phone] or [phone] (330p-1030p) Main Rx [id number] 09/17/2017,1:41 PM

## 2017-09-17 NOTE — Progress Notes (Addendum)
 Progress Note  Patient Name: Katie Rasmussen Date of Encounter: 09/17/2017  Primary Cardiologist: New,  Nahser  Subjective   Katie Rasmussen is a 47 y.o. female with a hx of COPD on home O2, morbid obesity, acute on chronic systolic and diastolic heart failure (per echo 09/13/2017 with new EF 30-35% with akinetic anterior wall concerning for Takotsubo cardiomyopathy), hypothyroidism and OSA who is being seen today for the evaluation of newly diagnosed symptomatic systolic heart failure and elevated troponin levels at the request of Dr. Ghimire from [facility]. She has influenza A.  Renal function had deteriorated slightly.  Lasix and lisinopril are currently on hold.  03/11: Pt does not feel she is breathing well enough to have a cath today. Still wheezing quite a bit, hurts to cough.   Frustrated that she is not getting better faster. Sx began about a week ago. Dizzy when she turns her head and her head is stuffy, also.   Inpatient Medications    Scheduled Meds: . aspirin EC  81 mg Oral Daily  . carvedilol  3.125 mg Oral BID WC  . dextromethorphan-guaiFENesin  1 tablet Oral BID  . doxycycline  100 mg Oral Q12H  . ipratropium-albuterol  3 mL Nebulization QID  . levothyroxine  175 mcg Oral QAC breakfast  . methylPREDNISolone (SOLU-MEDROL) injection  60 mg Intravenous Q8H  . mometasone-formoterol  2 puff Inhalation BID  . nicotine  14 mg Transdermal Daily  . nystatin  5 mL Oral QID  . oseltamivir  30 mg Oral BID  . sodium chloride flush  3 mL Intravenous Q12H  . sodium chloride flush  3 mL Intravenous Q12H   Continuous Infusions: . sodium chloride    . sodium chloride    . sodium chloride 10 mL/hr at 09/17/17 0104  . heparin 1,400 Units/hr (09/17/17 0640)   PRN Meds: sodium chloride, sodium chloride, acetaminophen, albuterol, benzonatate, HYDROcodone-acetaminophen, magnesium hydroxide, ondansetron (ZOFRAN) IV, simethicone, sodium chloride flush, sodium chloride  flush   Vital Signs    Vitals:   09/16/17 2047 09/16/17 2326 09/17/17 0410 09/17/17 0807  BP: (!) 163/93 (!) 173/77 (!) 169/98 (!) 160/79  Pulse: 70 64 72 70  Resp: (!) 21 (!) 25 17 19  Temp: 97.9 F (36.6 C) 98.5 F (36.9 C) 97.9 F (36.6 C) 98 F (36.7 C)  TempSrc: Oral Oral Oral Oral  SpO2: 93% 97% 94% 96%  Weight:   231 lb (104.8 kg)   Height:        Intake/Output Summary (Last 24 hours) at 09/17/2017 0908 Last data filed at 09/17/2017 0700 Gross per 24 hour  Intake 720 ml  Output 1450 ml  Net -730 ml   Filed Weights   09/15/17 0415 09/16/17 0540 09/17/17 0410  Weight: 232 lb (105.2 kg) 231 lb 11.2 oz (105.1 kg) 231 lb (104.8 kg)    Telemetry    Sinus rhythm, no sig ectopy  - Personally Reviewed  ECG     03/09, NSR  - Personally Reviewed  Physical Exam   Physical Exam: Blood pressure (!) 160/79, pulse 70, temperature 98 F (36.7 C), temperature source Oral, resp. rate 19, height 5' 1" (1.549 m), weight 231 lb (104.8 kg), last menstrual period 09/15/2017, SpO2 96 %.  GEN:   Morbidly obese middle age female  HEENT: Normal NECK: No JVD seen, difficult to assess 2nd body habitus; No carotid bruits LYMPHATICS: No lymphadenopathy CARDIAC: RR , distant heart sounds RESPIRATORY:  Bilateral insp and exp wheezes - worse   with forced expiration  ABDOMEN: Soft, non-tender, non-distended MUSCULOSKELETAL:  No edema; No deformity  SKIN: Warm and dry NEUROLOGIC:  Alert and oriented x 3  Labs    Chemistry Recent Labs  Lab 09/14/17 1349 09/15/17 0131 09/16/17 0429 09/17/17 0417  NA 138 137 137 137  K 4.0 3.9 4.7 4.6  CL 96* 93* 97* 100*  CO2 26 28 28 28   GLUCOSE 146* 128* 172* 152*  BUN 26* 41* 49* 41*  CREATININE 1.45* 1.71* 1.43* 1.25*  CALCIUM 8.7* 8.5* 8.9 9.0  PROT 6.7  --   --   --   ALBUMIN 3.2*  --   --   --   AST 22  --   --   --   ALT 23  --   --   --   ALKPHOS 84  --   --   --   BILITOT 0.8  --   --   --   GFRNONAA 42* 35* 43* 50*  GFRAA 49*  40* 50* 58*  ANIONGAP 16* 16* 12 9     Hematology Recent Labs  Lab 09/14/17 1349 09/16/17 0429 09/17/17 0417  WBC 15.5* 10.0 12.2*  RBC 4.62 4.66 4.71  HGB 13.3 13.4 13.5  HCT 41.4 42.1 43.1  MCV 89.6 90.3 91.5  MCH 28.8 28.8 28.7  MCHC 32.1 31.8 31.3  RDW 15.5 15.4 15.0  PLT 253 231 224    Cardiac Enzymes Recent Labs  Lab 09/14/17 1349 09/14/17 1936 09/15/17 0131  TROPONINI 0.21* 0.20* 0.18*     BNP Recent Labs  Lab 09/14/17 1333  BNP 501.7*     Radiology    No results found.  Cardiac Studies   CATH: ordered  Patient Profile     47 y.o. female with acute on chronic congestive heart failure w/ EF 30-35% per echo 09/13/2017 -possibly related to Takotsubo syndrome.  Hx COPD on home O2, morbid obesity. Tx from [facility] for heart catheterization.  She was found to have influenza A.  Assessment & Plan    1.  Acute on chronic combined systolic and diastolic congestive heart failure: T - wt is stable off Lasix, 2 gm Na diet - patient has severe COPD and is on home oxygen.  - She has been found to have influenza A.  -  Echocardiogram at [facility] shows ejection fraction of 30-35%.  ?takotsubo syndrome vs. Ischemic heart disease.  - on heparin, ASA, BB>>increase Coreg to 6.25 mg bid - add high-dose statin  - MD advise if should be R/L cath - Pharmacy to advise if pt needs additional dye rxn prophylaxis or if the Solu-medrol she is on is enough.  Heart catheterization at some point, when resp improved. NPO after mn tonight, ck in am  2.  Acute on chronic renal insufficiency:   Renal function has improved slightly. Continue to follow. May need to do cors only and stage PCI if needed  3. COPD exacerbation, Flu A - She still fairly tight and has some degree of respiratory distress. - continue rx per IM   For questions or updates, please contact [facility] Please consult [email or web address] for contact info under Cardiology/STEMI.       Signed, [facility], PA-C  09/17/2017, 9:08 AM

## 2017-09-17 NOTE — Progress Notes (Addendum)
PROGRESS NOTE   Katie Rasmussen  QAS:[id number]    DOB: [birth year]    DOA: 09/14/2017  PCP: Patient, No Pcp Per   I have briefly reviewed patients previous medical records in [facility].  Brief Narrative:  47 year old married female, lives with her spouse and 5 children aged 17-24, ambulates with the help of a cane, PMH of COPD, nocturnal hypoxia/OHS/chronic hypoxic respiratory failure on 3 L/min at bedtime (waiting to see Dr. Williams Che, Pulmonology), morbid obesity, chronic combined CHF, tobacco abuse initially presented to [facility] with cough, dyspnea, subjective fevers, multiple family members sick with URTI symptoms, progressively got worse and drove herself to [facility] where she nearly passed out in the parking lot and was cyanotic, acute on chronic hypoxic and hypercapnic respiratory failure (pH of 7.1), tried on BiPAP with Lasix, antimicrobial therapy, liberated off BiPAP, noted elevated troponin, echo showed new reduced EF 30-35% with wall motion abnormalities with concern for Takotsubo cardiomyopathy and transferred to [facility] for cardiology consultation.  Ruled in for influenza A.  Cardiology consulted.  Improving but slowly.   Assessment & Plan:   Principal Problem:   Acute combined systolic and diastolic CHF, NYHA class 3 (HCC) Active Problems:   Acute on chronic respiratory failure with hypoxia and hypercapnia (HCC)   Pneumonia   Obesity hypoventilation syndrome (HCC)   COPD (chronic obstructive pulmonary disease) (HCC)   Bronchitis due to tobacco use (HCC)   DCM (dilated cardiomyopathy) (HCC)   Elevated troponin   Acute on chronic hypoxic and hypercapnic respiratory failure: Likely precipitated by influenza A acute bronchitis causing COPD exacerbation and decompensated CHF complicating underlying OHS.  Initially admitted to [facility] ICU and was on BiPAP.  Now off of BiPAP and saturating 96-97% on 3 L/min. Chest x-ray 3/9 personally reviewed: No  acute findings.  Wean oxygen to saturation 88-92%.  Improving but slowly, continue current management.  Influenza A acute bronchitis/COPD exacerbation: RSV panel positive for influenza A H3.  Started Tamiflu, complete 5 days course.  Scheduled and as needed bronchodilator nebulizations.  Flutter valve, has not gotten it yet, discussed with RN.  Changed oral prednisone to IV Solu-Medrol 60 mg every 8 hourly.  Scheduled Mucinex DM and as needed Tessalon for cough.  Continue oral doxycycline for now. ?  Prednisone dependent PTA (was not on a fixed small dose daily but had been on frequent courses of steroids over the last year).  Improving but slowly, continue current dose of Solu-Medrol.  Acute on chronic combined systolic and diastolic CHF: Cardiology consultation appreciated.  2D echo at [facility] showed EF 30- 35%.?  Takotsubo syndrome versus ischemic heart disease.  Treated with IV Lasix.  Currently appears euvolemic and due to increase in creatinine, Lasix discontinued.  Also held lisinopril.  Cardiology plans heart cath pending improvement in respiratory status.  Continue carvedilol.  Clinically euvolemic.  Continue to hold Lasix for today.  Respiratory status not stable yet for cardiac cath.  Elevated troponin: Likely due to demand ischemia.  Flat trend.  No ischemic type of chest pain.  Continue IV heparin drip.  Cardiac cath when respiratory status stabilizes.  Acute on stage III chronic kidney disease: Creatinine has worsened from 1.45-1.71.  Discontinued Lasix and lisinopril.  Creatinine has improved to 1.4 >1.25.  Follow BMP daily.  OHS/nocturnal hypoxemia/chronic respiratory failure: Reports that she has never had a sleep study done.  Needs outpatient sleep study.  Hypertensive urgency: Blood pressures have improved.  Carvedilol increased to 6.25 twice daily.  Discontinued lisinopril due to worsening creatinine.  Uncontrolled.  Adjusted medications as above.  Added IV hydralazine as  needed.  Hypothyroid: Continue Synthroid.  Morbid obesity/Body mass index is 43.65 kg/m.  Tobacco abuse: Cessation counseled.  Nicotine patch.  Hyperglycemia: Likely due to steroids.  Check A1c and SSI as needed.  Mildly uncontrolled at times.  Dizziness: Mostly in upright position and?  Vertigo.  Check orthostatic blood pressures.  No focal deficits.?  Sinus congestion versus BPPV.  Symptoms only in the last 36-48 hours, did not mention it yesterday.  Check CT head without contrast.  Oral thrush: Continue nystatin.    DVT prophylaxis: IV heparin Code Status: Full Family Communication: None at bedside Disposition: DC home pending clinical improvement and further evaluation.  May take several more days.   Consultants:  Cardiology  Procedures:  BiPAP, now off  Antimicrobials:  Doxycycline Tamiflu   Subjective: Breathing continues to improve.  Feels that her breathing is almost 50% better.  Reports dizziness?  Vertigo mostly when she sits up but sometimes on turning her head.  No headache, earache or sore throat.  No chest pain.   ROS: As above  Objective:  Vitals:   09/17/17 0410 09/17/17 0807 09/17/17 0820 09/17/17 1250  BP: (!) 169/98 (!) 160/79    Pulse: 72 70 69 68  Resp: 17 19 20 19   Temp: 97.9 F (36.6 C) 98 F (36.7 C)  98 F (36.7 C)  TempSrc: Oral Oral  Oral  SpO2: 94% 96% 97%   Weight: 104.8 kg (231 lb)     Height:        Examination:  General exam: Young female, moderately built and morbidly obese lying comfortably propped up in bed.  Does not appear in any distress.  Oral thrush. Respiratory system: Continues to gradually improve.  Few scattered medium pitched expiratory rhonchi but significantly improved compared to 48 hours ago and moving air better.  No crackles.  No increased work of breathing. Cardiovascular system: S1 and S2 heard, RRR.  No JVD, murmurs or pedal edema.  Telemetry: Personally reviewed, SB in the 50s-SR in the  60s. Gastrointestinal system: Abdomen is nondistended, soft and nontender. No organomegaly or masses felt. Normal bowel sounds heard.  Stable without change. Central nervous system: Alert and oriented x3.  No focal neurological deficits.  No nystagmus. Extremities: Symmetric 5 x 5 power. Skin: No rashes, lesions or ulcers Psychiatry: Judgement and insight appear normal. Mood & affect appropriate.     Data Reviewed: I have personally reviewed following labs and imaging studies  CBC: Recent Labs  Lab 09/14/17 1349 09/16/17 0429 09/17/17 0417  WBC 15.5* 10.0 12.2*  NEUTROABS 14.2*  --   --   HGB 13.3 13.4 13.5  HCT 41.4 42.1 43.1  MCV 89.6 90.3 91.5  PLT 253 231 224   Basic Metabolic Panel: Recent Labs  Lab 09/14/17 1349 09/15/17 0131 09/16/17 0429 09/17/17 0417  NA 138 137 137 137  K 4.0 3.9 4.7 4.6  CL 96* 93* 97* 100*  CO2 26 28 28 28   GLUCOSE 146* 128* 172* 152*  BUN 26* 41* 49* 41*  CREATININE 1.45* 1.71* 1.43* 1.25*  CALCIUM 8.7* 8.5* 8.9 9.0  MG 2.2 2.9*  --   --    Liver Function Tests: Recent Labs  Lab 09/14/17 1349  AST 22  ALT 23  ALKPHOS 84  BILITOT 0.8  PROT 6.7  ALBUMIN 3.2*   Coagulation Profile: Recent Labs  Lab 09/14/17 1349 09/17/17 0417  INR 0.96 1.00   Cardiac Enzymes: Recent Labs  Lab 09/14/17 1349 09/14/17 1936 09/15/17 0131  TROPONINI 0.21* 0.20* 0.18*   HbA1C: No results for input(s): HGBA1C in the last 72 hours. CBG: Recent Labs  Lab 09/16/17 1107 09/16/17 1621 09/16/17 2046 09/17/17 0923 09/17/17 1132  GLUCAP 169* 162* 231* 138* 195*    Recent Results (from the past 240 hour(s))  Culture, expectorated sputum-assessment     Status: None   Collection Time: 09/14/17  5:49 AM  Result Value Ref Range Status   Specimen Description EXPECTORATED SPUTUM  Final   Special Requests   Final    NONE Performed at [facility], [address] [place], [place] [address]    Sputum evaluation THIS SPECIMEN IS  ACCEPTABLE FOR SPUTUM CULTURE  Final   Report Status 09/15/2017 FINAL  Final  Culture, respiratory (NON-Expectorated)     Status: None   Collection Time: 09/14/17  5:49 AM  Result Value Ref Range Status   Specimen Description EXPECTORATED SPUTUM  Final   Special Requests NONE Reflexed from [id number]  Final   Gram Stain   Final    MODERATE WBC PRESENT, PREDOMINANTLY PMN RARE SQUAMOUS EPITHELIAL CELLS PRESENT RARE GRAM POSITIVE COCCI    Culture   Final    Consistent with normal respiratory flora. Performed at [facility], [address] [place], [place] [address]    Report Status 09/17/2017 FINAL  Final  Respiratory Panel by PCR     Status: Abnormal   Collection Time: 09/14/17  1:33 PM  Result Value Ref Range Status   Adenovirus NOT DETECTED NOT DETECTED Final   Coronavirus 229E NOT DETECTED NOT DETECTED Final   Coronavirus HKU1 NOT DETECTED NOT DETECTED Final   Coronavirus NL63 NOT DETECTED NOT DETECTED Final   Coronavirus OC43 NOT DETECTED NOT DETECTED Final   Metapneumovirus NOT DETECTED NOT DETECTED Final   Rhinovirus / Enterovirus NOT DETECTED NOT DETECTED Final   Influenza A H3 DETECTED (A) NOT DETECTED Final   Influenza B NOT DETECTED NOT DETECTED Final   Parainfluenza Virus 1 NOT DETECTED NOT DETECTED Final   Parainfluenza Virus 2 NOT DETECTED NOT DETECTED Final   Parainfluenza Virus 3 NOT DETECTED NOT DETECTED Final   Parainfluenza Virus 4 NOT DETECTED NOT DETECTED Final   Respiratory Syncytial Virus NOT DETECTED NOT DETECTED Final   Bordetella pertussis NOT DETECTED NOT DETECTED Final   Chlamydophila pneumoniae NOT DETECTED NOT DETECTED Final   Mycoplasma pneumoniae NOT DETECTED NOT DETECTED Final    Comment: Performed at [facility], [address] [place], [place] [address]  MRSA PCR Screening     Status: None   Collection Time: 09/14/17  1:33 PM  Result Value Ref Range Status   MRSA by PCR NEGATIVE NEGATIVE Final    Comment:        The GeneXpert MRSA  Assay (FDA approved for NASAL specimens only), is one component of a comprehensive MRSA colonization surveillance program. It is not intended to diagnose MRSA infection nor to guide or monitor treatment for MRSA infections. Performed at [facility], [address] [place], [place] [address]          Radiology Studies: No results found.      Scheduled Meds: . aspirin EC  81 mg Oral Daily  . atorvastatin  80 mg Oral q1800  . carvedilol  6.25 mg Oral BID WC  . dextromethorphan-guaiFENesin  1 tablet Oral BID  . doxycycline  100 mg Oral Q12H  . ipratropium-albuterol  3 mL Nebulization QID  . levothyroxine  175 mcg Oral QAC breakfast  . methylPREDNISolone (SOLU-MEDROL) injection  60 mg Intravenous Q8H  . mometasone-formoterol  2 puff Inhalation BID  . nicotine  14 mg Transdermal Daily  . nystatin  5 mL Oral QID  . oseltamivir  30 mg Oral BID  . sodium chloride flush  3 mL Intravenous Q12H  . sodium chloride flush  3 mL Intravenous Q12H   Continuous Infusions: . sodium chloride    . sodium chloride    . sodium chloride 10 mL/hr at 09/17/17 0700  . heparin 1,400 Units/hr (09/17/17 1238)     LOS: 3 days     Marcellus Scott, MD, FACP, [facility]. [facility] Pager [phone]  If 7PM-7AM, please contact night-coverage [email or web address] Password [email or web address] 09/17/2017, 1:15 PM

## 2017-09-17 NOTE — Progress Notes (Signed)
ANTICOAGULATION CONSULT NOTE - Follow Up Consult  Pharmacy Consult for heparin Indication: chest pain/ACS  Labs: Recent Labs    09/14/17 1349 09/14/17 1936 09/15/17 0131  09/16/17 0429 09/16/17 0434 09/16/17 1225 09/17/17 0417  HGB 13.3  --   --   --  13.4  --   --  13.5  HCT 41.4  --   --   --  42.1  --   --  43.1  PLT 253  --   --   --  231  --   --  224  LABPROT 12.7  --   --   --   --   --   --  13.1  INR 0.96  --   --   --   --   --   --  1.00  HEPARINUNFRC  --   --   --    < >  --  0.27* 0.64 0.93*  CREATININE 1.45*  --  1.71*  --  1.43*  --   --  1.25*  TROPONINI 0.21* 0.20* 0.18*  --   --   --   --   --    < > = values in this interval not displayed.    Assessment: 47yo female now supratherapeutic on heparin after one level at upper end of goal; RN notes no signs of bleeding though she does note that the IV site was changed, the prior IV had sluggish blood return and the new one has better blood return.  Goal of Therapy:  Heparin level 0.3-0.7 units/ml   Plan:  Will decrease heparin gtt by 1-2 units/kg/hr to 1400 units/hr and check level in 6 hours.    Vernard Gambles, PharmD, BCPS  09/17/2017,6:33 AM

## 2017-09-17 NOTE — H&P (View-Only) (Signed)
Progress Note  Patient Name: Katie Rasmussen Date of Encounter: 09/17/2017  Primary Cardiologist: New,  Nahser  Subjective   Katie Rasmussen is a 47 y.o. female with a hx of COPD on home O2, morbid obesity, acute on chronic systolic and diastolic heart failure (per echo 09/13/2017 with new EF 30-35% with akinetic anterior wall concerning for Takotsubo cardiomyopathy), hypothyroidism and OSA who is being seen today for the evaluation of newly diagnosed symptomatic systolic heart failure and elevated troponin levels at the request of Dr. Jerral Ralph from [facility]. She has influenza A.  Renal function had deteriorated slightly.  Lasix and lisinopril are currently on hold.  03/11: Pt does not feel she is breathing well enough to have a cath today. Still wheezing quite a bit, hurts to cough.   Frustrated that she is not getting better faster. Sx began about a week ago. Dizzy when she turns her head and her head is stuffy, also.   Inpatient Medications    Scheduled Meds: . aspirin EC  81 mg Oral Daily  . carvedilol  3.125 mg Oral BID WC  . dextromethorphan-guaiFENesin  1 tablet Oral BID  . doxycycline  100 mg Oral Q12H  . ipratropium-albuterol  3 mL Nebulization QID  . levothyroxine  175 mcg Oral QAC breakfast  . methylPREDNISolone (SOLU-MEDROL) injection  60 mg Intravenous Q8H  . mometasone-formoterol  2 puff Inhalation BID  . nicotine  14 mg Transdermal Daily  . nystatin  5 mL Oral QID  . oseltamivir  30 mg Oral BID  . sodium chloride flush  3 mL Intravenous Q12H  . sodium chloride flush  3 mL Intravenous Q12H   Continuous Infusions: . sodium chloride    . sodium chloride    . sodium chloride 10 mL/hr at 09/17/17 0104  . heparin 1,400 Units/hr (09/17/17 0640)   PRN Meds: sodium chloride, sodium chloride, acetaminophen, albuterol, benzonatate, HYDROcodone-acetaminophen, magnesium hydroxide, ondansetron (ZOFRAN) IV, simethicone, sodium chloride flush, sodium chloride  flush   Vital Signs    Vitals:   09/16/17 2047 09/16/17 2326 09/17/17 0410 09/17/17 0807  BP: (!) 163/93 (!) 173/77 (!) 169/98 (!) 160/79  Pulse: 70 64 72 70  Resp: (!) 21 (!) 25 17 19   Temp: 97.9 F (36.6 C) 98.5 F (36.9 C) 97.9 F (36.6 C) 98 F (36.7 C)  TempSrc: Oral Oral Oral Oral  SpO2: 93% 97% 94% 96%  Weight:   231 lb (104.8 kg)   Height:        Intake/Output Summary (Last 24 hours) at 09/17/2017 0908 Last data filed at 09/17/2017 0700 Gross per 24 hour  Intake 720 ml  Output 1450 ml  Net -730 ml   Filed Weights   09/15/17 0415 09/16/17 0540 09/17/17 0410  Weight: 232 lb (105.2 kg) 231 lb 11.2 oz (105.1 kg) 231 lb (104.8 kg)    Telemetry    Sinus rhythm, no sig ectopy  - Personally Reviewed  ECG     03/09, NSR  - Personally Reviewed  Physical Exam   Physical Exam: Blood pressure (!) 160/79, pulse 70, temperature 98 F (36.7 C), temperature source Oral, resp. rate 19, height 5\' 1"  (1.549 m), weight 231 lb (104.8 kg), last menstrual period 09/15/2017, SpO2 96 %.  GEN:   Morbidly obese middle age female  HEENT: Normal NECK: No JVD seen, difficult to assess 2nd body habitus; No carotid bruits LYMPHATICS: No lymphadenopathy CARDIAC: RR , distant heart sounds RESPIRATORY:  Bilateral insp and exp wheezes - worse  with forced expiration  ABDOMEN: Soft, non-tender, non-distended MUSCULOSKELETAL:  No edema; No deformity  SKIN: Warm and dry NEUROLOGIC:  Alert and oriented x 3  Labs    Chemistry Recent Labs  Lab 09/14/17 1349 09/15/17 0131 09/16/17 0429 09/17/17 0417  NA 138 137 137 137  K 4.0 3.9 4.7 4.6  CL 96* 93* 97* 100*  CO2 26 28 28 28   GLUCOSE 146* 128* 172* 152*  BUN 26* 41* 49* 41*  CREATININE 1.45* 1.71* 1.43* 1.25*  CALCIUM 8.7* 8.5* 8.9 9.0  PROT 6.7  --   --   --   ALBUMIN 3.2*  --   --   --   AST 22  --   --   --   ALT 23  --   --   --   ALKPHOS 84  --   --   --   BILITOT 0.8  --   --   --   GFRNONAA 42* 35* 43* 50*  GFRAA 49*  40* 50* 58*  ANIONGAP 16* 16* 12 9     Hematology Recent Labs  Lab 09/14/17 1349 09/16/17 0429 09/17/17 0417  WBC 15.5* 10.0 12.2*  RBC 4.62 4.66 4.71  HGB 13.3 13.4 13.5  HCT 41.4 42.1 43.1  MCV 89.6 90.3 91.5  MCH 28.8 28.8 28.7  MCHC 32.1 31.8 31.3  RDW 15.5 15.4 15.0  PLT 253 231 224    Cardiac Enzymes Recent Labs  Lab 09/14/17 1349 09/14/17 1936 09/15/17 0131  TROPONINI 0.21* 0.20* 0.18*     BNP Recent Labs  Lab 09/14/17 1333  BNP 501.7*     Radiology    No results found.  Cardiac Studies   CATH: ordered  Patient Profile     47 y.o. female with acute on chronic congestive heart failure w/ EF 30-35% per echo 09/13/2017 -possibly related to Takotsubo syndrome.  Hx COPD on home O2, morbid obesity. Tx from [facility] for heart catheterization.  She was found to have influenza A.  Assessment & Plan    1.  Acute on chronic combined systolic and diastolic congestive heart failure: T - wt is stable off Lasix, 2 gm Na diet - patient has severe COPD and is on home oxygen.  - She has been found to have influenza A.  -  Echocardiogram at [facility] shows ejection fraction of 30-35%.  ?takotsubo syndrome vs. Ischemic heart disease.  - on heparin, ASA, BB>>increase Coreg to 6.25 mg bid - add high-dose statin  - MD advise if should be R/L cath - Pharmacy to advise if pt needs additional dye rxn prophylaxis or if the Solu-medrol she is on is enough.  Heart catheterization at some point, when resp improved. NPO after mn tonight, ck in am  2.  Acute on chronic renal insufficiency:   Renal function has improved slightly. Continue to follow. May need to do cors only and stage PCI if needed  3. COPD exacerbation, Flu A - She still fairly tight and has some degree of respiratory distress. - continue rx per IM   For questions or updates, please contact [facility] Please consult [email or web address] for contact info under Cardiology/STEMI.       Signed, [facility], PA-C  09/17/2017, 9:08 AM

## 2017-09-18 ENCOUNTER — Encounter (HOSPITAL_COMMUNITY): Payer: Self-pay | Admitting: Cardiovascular Disease

## 2017-09-18 ENCOUNTER — Encounter (HOSPITAL_COMMUNITY)
Admission: AD | Disposition: A | Discharge status: Home / Self Care | Payer: Self-pay | Source: Other Acute Inpatient Hospital | Attending: Internal Medicine

## 2017-09-18 HISTORY — PX: CORONARY STENT INTERVENTION: CATH118234

## 2017-09-18 HISTORY — PX: LEFT HEART CATH AND CORONARY ANGIOGRAPHY: CATH118249

## 2017-09-18 LAB — BASIC METABOLIC PANEL
ANION GAP: 12 (ref 5–15)
BUN: 39 mg/dL — ABNORMAL HIGH (ref 6–20)
CALCIUM: 8.9 mg/dL (ref 8.9–10.3)
CO2: 23 mmol/L (ref 22–32)
Chloride: 103 mmol/L (ref 101–111)
Creatinine, Ser: 1.2 mg/dL — ABNORMAL HIGH (ref 0.44–1.00)
GFR, EST NON AFRICAN AMERICAN: 53 mL/min — AB (ref >60)
Glucose, Bld: 177 mg/dL — ABNORMAL HIGH (ref 65–99)
Potassium: 5 mmol/L (ref 3.5–5.1)
Sodium: 138 mmol/L (ref 135–145)

## 2017-09-18 LAB — CBC
HCT: 41.8 % (ref 36.0–46.0)
Hemoglobin: 13.1 g/dL (ref 12.0–15.0)
MCH: 28.5 pg (ref 26.0–34.0)
MCHC: 31.3 g/dL (ref 30.0–36.0)
MCV: 90.9 fL (ref 78.0–100.0)
PLATELETS: 212 10*3/uL (ref 150–400)
RBC: 4.6 MIL/uL (ref 3.87–5.11)
RDW: 14.9 % (ref 11.5–15.5)
WBC: 13.6 10*3/uL — ABNORMAL HIGH (ref 4.0–10.5)

## 2017-09-18 LAB — HEPARIN LEVEL (UNFRACTIONATED): HEPARIN UNFRACTIONATED: 0.69 [IU]/mL (ref 0.30–0.70)

## 2017-09-18 LAB — POCT ACTIVATED CLOTTING TIME
Activated Clotting Time: 246 seconds
Activated Clotting Time: 257 seconds

## 2017-09-18 SURGERY — LEFT HEART CATH AND CORONARY ANGIOGRAPHY
Anesthesia: LOCAL

## 2017-09-18 MED ORDER — SODIUM CHLORIDE 0.9% FLUSH
3.0000 mL | Freq: Two times a day (BID) | INTRAVENOUS | Status: DC
  Administered 2017-09-18 – 2017-09-20 (×5): 3 mL via INTRAVENOUS

## 2017-09-18 MED ORDER — AMLODIPINE BESYLATE 10 MG PO TABS
10.0000 mg | ORAL_TABLET | Freq: Every day | ORAL | Status: DC
  Administered 2017-09-18 – 2017-09-20 (×3): 10 mg via ORAL
  Filled 2017-09-18 (×4): qty 1

## 2017-09-18 MED ORDER — IOPAMIDOL (ISOVUE-370) INJECTION 76%
INTRAVENOUS | Status: AC
  Filled 2017-09-18: qty 100

## 2017-09-18 MED ORDER — HYDRALAZINE HCL 20 MG/ML IJ SOLN
INTRAMUSCULAR | Status: DC | PRN
  Administered 2017-09-18: 10 mg via INTRAVENOUS

## 2017-09-18 MED ORDER — ACETAMINOPHEN 325 MG PO TABS
650.0000 mg | ORAL_TABLET | Freq: Once | ORAL | Status: AC
  Administered 2017-09-18: 650 mg via ORAL
  Filled 2017-09-18 (×2): qty 2

## 2017-09-18 MED ORDER — SODIUM CHLORIDE 0.9 % IV SOLN: INTRAVENOUS | Status: AC

## 2017-09-18 MED ORDER — LIDOCAINE HCL (PF) 1 % IJ SOLN
INTRAMUSCULAR | Status: DC | PRN
  Administered 2017-09-18: 4 mL via INTRADERMAL
  Administered 2017-09-18: 3 mL via INTRADERMAL

## 2017-09-18 MED ORDER — HYDRALAZINE HCL 20 MG/ML IJ SOLN
INTRAMUSCULAR | Status: AC
  Filled 2017-09-18: qty 1

## 2017-09-18 MED ORDER — MIDAZOLAM HCL 2 MG/2ML IJ SOLN
INTRAMUSCULAR | Status: AC
  Filled 2017-09-18: qty 2

## 2017-09-18 MED ORDER — ASPIRIN 81 MG PO CHEW
81.0000 mg | CHEWABLE_TABLET | ORAL | Status: AC
  Administered 2017-09-18: 81 mg via ORAL
  Filled 2017-09-18: qty 1

## 2017-09-18 MED ORDER — FAMOTIDINE IN NACL 20-0.9 MG/50ML-% IV SOLN
INTRAVENOUS | Status: AC | PRN
  Administered 2017-09-18: 20 mg via INTRAVENOUS

## 2017-09-18 MED ORDER — HEPARIN SODIUM (PORCINE) 1000 UNIT/ML IJ SOLN
INTRAMUSCULAR | Status: AC
  Filled 2017-09-18: qty 1

## 2017-09-18 MED ORDER — LABETALOL HCL 5 MG/ML IV SOLN: 10.0000 mg | INTRAVENOUS | Status: AC | PRN

## 2017-09-18 MED ORDER — VERAPAMIL HCL 2.5 MG/ML IV SOLN
INTRAVENOUS | Status: AC
  Filled 2017-09-18: qty 2

## 2017-09-18 MED ORDER — HEPARIN (PORCINE) IN NACL 2-0.9 UNIT/ML-% IJ SOLN
INTRAMUSCULAR | Status: AC
  Filled 2017-09-18: qty 1000

## 2017-09-18 MED ORDER — DIPHENHYDRAMINE HCL 50 MG/ML IJ SOLN
25.0000 mg | Freq: Once | INTRAMUSCULAR | Status: AC
  Administered 2017-09-18: 25 mg via INTRAVENOUS
  Filled 2017-09-18: qty 1

## 2017-09-18 MED ORDER — SODIUM CHLORIDE 0.9% FLUSH: 3.0000 mL | INTRAVENOUS | Status: DC | PRN

## 2017-09-18 MED ORDER — CLOPIDOGREL BISULFATE 300 MG PO TABS
ORAL_TABLET | ORAL | Status: DC | PRN
  Administered 2017-09-18: 600 mg via ORAL

## 2017-09-18 MED ORDER — FENTANYL CITRATE (PF) 100 MCG/2ML IJ SOLN
INTRAMUSCULAR | Status: AC
  Filled 2017-09-18: qty 2

## 2017-09-18 MED ORDER — IOPAMIDOL (ISOVUE-370) INJECTION 76%
INTRAVENOUS | Status: DC | PRN
  Administered 2017-09-18: 120 mL via INTRA_ARTERIAL

## 2017-09-18 MED ORDER — NITROGLYCERIN 1 MG/10 ML FOR IR/CATH LAB
INTRA_ARTERIAL | Status: DC | PRN
  Administered 2017-09-18 (×3): 200 ug via INTRACORONARY

## 2017-09-18 MED ORDER — SODIUM CHLORIDE 0.9 % IV SOLN: 250.0000 mL | INTRAVENOUS | Status: DC | PRN

## 2017-09-18 MED ORDER — SODIUM CHLORIDE 0.9 % IV SOLN
INTRAVENOUS | Status: DC
  Administered 2017-09-18: 07:00:00 via INTRAVENOUS

## 2017-09-18 MED ORDER — LIDOCAINE HCL (PF) 1 % IJ SOLN
INTRAMUSCULAR | Status: AC
  Filled 2017-09-18: qty 30

## 2017-09-18 MED ORDER — CLOPIDOGREL BISULFATE 300 MG PO TABS
ORAL_TABLET | ORAL | Status: AC
  Filled 2017-09-18: qty 2

## 2017-09-18 MED ORDER — AMLODIPINE BESYLATE 5 MG PO TABS
ORAL_TABLET | ORAL | Status: AC
  Filled 2017-09-18: qty 2

## 2017-09-18 MED ORDER — SODIUM CHLORIDE 0.9 % IV SOLN
INTRAVENOUS | Status: AC | PRN
  Administered 2017-09-18: 10 mL/h via INTRAVENOUS

## 2017-09-18 MED ORDER — LORAZEPAM 2 MG/ML IJ SOLN: 1.0000 mg | Freq: Once | INTRAMUSCULAR | Status: DC

## 2017-09-18 MED ORDER — MIDAZOLAM HCL 2 MG/2ML IJ SOLN
INTRAMUSCULAR | Status: DC | PRN
  Administered 2017-09-18 (×3): 1 mg via INTRAVENOUS

## 2017-09-18 MED ORDER — FENTANYL CITRATE (PF) 100 MCG/2ML IJ SOLN
INTRAMUSCULAR | Status: DC | PRN
  Administered 2017-09-18: 25 ug via INTRAVENOUS

## 2017-09-18 MED ORDER — FAMOTIDINE IN NACL 20-0.9 MG/50ML-% IV SOLN
INTRAVENOUS | Status: AC
  Filled 2017-09-18: qty 50

## 2017-09-18 MED ORDER — HEPARIN SODIUM (PORCINE) 1000 UNIT/ML IJ SOLN
INTRAMUSCULAR | Status: DC | PRN
  Administered 2017-09-18: 5000 [IU] via INTRAVENOUS
  Administered 2017-09-18: 3000 [IU] via INTRAVENOUS
  Administered 2017-09-18: 5000 [IU] via INTRAVENOUS

## 2017-09-18 MED ORDER — HEPARIN (PORCINE) IN NACL 2-0.9 UNIT/ML-% IJ SOLN
INTRAMUSCULAR | Status: AC | PRN
  Administered 2017-09-18 (×2): 500 mL

## 2017-09-18 MED ORDER — CLOPIDOGREL BISULFATE 75 MG PO TABS
75.0000 mg | ORAL_TABLET | Freq: Every day | ORAL | Status: DC
  Administered 2017-09-19 – 2017-09-20 (×2): 75 mg via ORAL
  Filled 2017-09-18 (×2): qty 1

## 2017-09-18 MED ORDER — ALPRAZOLAM 0.25 MG PO TABS
0.2500 mg | ORAL_TABLET | Freq: Two times a day (BID) | ORAL | Status: DC | PRN
  Administered 2017-09-18 – 2017-09-20 (×4): 0.25 mg via ORAL
  Filled 2017-09-18 (×4): qty 1

## 2017-09-18 MED ORDER — IPRATROPIUM-ALBUTEROL 0.5-2.5 (3) MG/3ML IN SOLN
3.0000 mL | Freq: Two times a day (BID) | RESPIRATORY_TRACT | Status: DC
  Administered 2017-09-19 (×2): 3 mL via RESPIRATORY_TRACT
  Filled 2017-09-18 (×2): qty 3

## 2017-09-18 MED ORDER — VERAPAMIL HCL 2.5 MG/ML IV SOLN
INTRAVENOUS | Status: DC | PRN
  Administered 2017-09-18 (×2): 10 mL via INTRA_ARTERIAL

## 2017-09-18 MED ORDER — ACETAMINOPHEN 325 MG PO TABS
ORAL_TABLET | ORAL | Status: AC
  Filled 2017-09-18: qty 2

## 2017-09-18 MED ORDER — ALPRAZOLAM 0.25 MG PO TABS
0.2500 mg | ORAL_TABLET | Freq: Once | ORAL | Status: AC
  Administered 2017-09-18: 0.25 mg via ORAL
  Filled 2017-09-18: qty 1

## 2017-09-18 MED ORDER — NITROGLYCERIN IN D5W 200-5 MCG/ML-% IV SOLN
INTRAVENOUS | Status: AC
  Filled 2017-09-18: qty 250

## 2017-09-18 MED ORDER — NITROGLYCERIN IN D5W 200-5 MCG/ML-% IV SOLN
0.0000 ug/min | INTRAVENOUS | Status: DC
  Administered 2017-09-18: 10 ug/min via INTRAVENOUS

## 2017-09-18 SURGICAL SUPPLY — 22 items
BALLN SAPPHIRE ~~LOC~~ 3.25X12 (BALLOONS) ×1 IMPLANT
BAND CMPR LRG ZPHR (HEMOSTASIS) ×2
BAND ZEPHYR COMPRESS 30 LONG (HEMOSTASIS) ×2 IMPLANT
CATH IMPULSE 5F ANG/FL3.5 (CATHETERS) ×1 IMPLANT
CATH INFINITI MULTIPACK ANG 4F (CATHETERS) ×1 IMPLANT
CATH LAUNCHER 5F EBU3.0 (CATHETERS) IMPLANT
CATHETER LAUNCHER 5F EBU3.0 (CATHETERS) ×2
GUIDEWIRE ANGLED .035X150CM (WIRE) ×1 IMPLANT
GUIDEWIRE INQWIRE 1.5J.035X260 (WIRE) IMPLANT
INQWIRE 1.5J .035X260CM (WIRE) ×2
KIT ENCORE 26 ADVANTAGE (KITS) ×1 IMPLANT
KIT ESSENTIALS PG (KITS) IMPLANT
KIT HEART LEFT (KITS) ×2 IMPLANT
NDL PERC 21GX4CM (NEEDLE) IMPLANT
NEEDLE PERC 21GX4CM (NEEDLE) ×2 IMPLANT
PACK CARDIAC CATHETERIZATION (CUSTOM PROCEDURE TRAY) ×2 IMPLANT
SHEATH RAIN RADIAL 21G 6FR (SHEATH) ×2 IMPLANT
STENT SIERRA 3.00 X 18 MM (Permanent Stent) ×1 IMPLANT
TRANSDUCER W/STOPCOCK (MISCELLANEOUS) ×2 IMPLANT
TUBING CIL FLEX 10 FLL-RA (TUBING) ×2 IMPLANT
WIRE COUGAR XT STRL 190CM (WIRE) ×2 IMPLANT
WIRE HI TORQ VERSACORE-J 145CM (WIRE) ×1 IMPLANT

## 2017-09-18 NOTE — Progress Notes (Signed)
Progress Note  Patient Name: Katie Rasmussen Date of Encounter: 09/18/2017  Primary Cardiologist: New,  Nahser  Subjective   Katie Rasmussen is a 47 y.o. female with a hx of COPD on home O2, morbid obesity, acute on chronic systolic and diastolic heart failure (per echo 09/13/2017 with new EF 30-35% with akinetic anterior wall concerning for Takotsubo cardiomyopathy), hypothyroidism and OSA who is being seen today for the evaluation of newly diagnosed symptomatic systolic heart failure and elevated troponin levels at the request of Dr. Jerral Ralph from [facility] She has influenza A.  Renal function had deteriorated slightly.  Lasix and lisinopril are currently on hold.  03/12: Pt lying in bed quietly on 2 lpm [place]. Pt breathing better, feels anxious, having panic attacks. Not wheezing as much, feels breathing about back to baseline. No CP except w/ cough. Wonders why her BP is so high.   Inpatient Medications    Scheduled Meds: . aspirin EC  81 mg Oral Daily  . atorvastatin  80 mg Oral q1800  . carvedilol  6.25 mg Oral BID WC  . dextromethorphan-guaiFENesin  1 tablet Oral BID  . diphenhydrAMINE  25 mg Intravenous Once  . doxycycline  100 mg Oral Q12H  . fluticasone  2 spray Each Nare Daily  . ipratropium-albuterol  3 mL Nebulization QID  . levothyroxine  175 mcg Oral QAC breakfast  . methylPREDNISolone (SOLU-MEDROL) injection  60 mg Intravenous Q8H  . mometasone-formoterol  2 puff Inhalation BID  . nicotine  14 mg Transdermal Daily  . nystatin  5 mL Oral QID  . oseltamivir  30 mg Oral BID  . sodium chloride flush  3 mL Intravenous Q12H  . sodium chloride flush  3 mL Intravenous Q12H    Continuous Infusions: . sodium chloride    . sodium chloride    . sodium chloride 10 mL/hr at 09/18/17 0641  . heparin 1,400 Units/hr (09/18/17 0640)   PRN Meds: sodium chloride, sodium chloride, acetaminophen, albuterol, ALPRAZolam, benzonatate, hydrALAZINE,  HYDROcodone-acetaminophen, magnesium hydroxide, ondansetron (ZOFRAN) IV, simethicone, sodium chloride flush, sodium chloride flush   Vital Signs    Vitals:   09/17/17 2026 09/17/17 2051 09/18/17 0041 09/18/17 0600  BP:  (!) 183/79 (!) 177/86 (!) 156/81  Pulse:  84 85   Resp:  (!) 22 (!) 27   Temp:  (!) 97.5 F (36.4 C) 97.6 F (36.4 C) 98 F (36.7 C)  TempSrc:  Oral Oral Oral  SpO2: 94% 94% 91%   Weight:    231 lb 8 oz (105 kg)  Height:        Intake/Output Summary (Last 24 hours) at 09/18/2017 0801 Last data filed at 09/18/2017 0641 Gross per 24 hour  Intake 1128 ml  Output 1400 ml  Net -272 ml   Filed Weights   09/16/17 0540 09/17/17 0410 09/18/17 0600  Weight: 231 lb 11.2 oz (105.1 kg) 231 lb (104.8 kg) 231 lb 8 oz (105 kg)    Telemetry    Sinus rhythm, no sig ectopy  - Personally Reviewed  ECG     03/09, NSR  - Personally Reviewed  Physical Exam   Physical Exam: Blood pressure (!) 156/81, pulse 85, temperature 98 F (36.7 C), temperature source Oral, resp. rate (!) 27, height 5\' 1"  (1.549 m), weight 231 lb 8 oz (105 kg), last menstrual period 09/15/2017, SpO2 91 %.  GEN:   Morbidly obese middle age female  HEENT: Normal NECK: No JVD seen, difficult to assess 2nd body habitus;  No carotid bruits LYMPHATICS: No lymphadenopathy CARDIAC: RR , distant heart sounds, RRR RESPIRATORY:  Mild exp wheezes - worse with forced expiration, harsh cough  ABDOMEN: Soft, non-tender, non-distended MUSCULOSKELETAL:  No edema; No deformity  SKIN: Warm and dry, flushed NEUROLOGIC:  Alert and oriented x 3  Labs    Chemistry Recent Labs  Lab 09/14/17 1349  09/16/17 0429 09/17/17 0417 09/18/17 0411  NA 138   < > 137 137 138  K 4.0   < > 4.7 4.6 5.0  CL 96*   < > 97* 100* 103  CO2 26   < > 28 28 23   GLUCOSE 146*   < > 172* 152* 177*  BUN 26*   < > 49* 41* 39*  CREATININE 1.45*   < > 1.43* 1.25* 1.20*  CALCIUM 8.7*   < > 8.9 9.0 8.9  PROT 6.7  --   --   --   --     ALBUMIN 3.2*  --   --   --   --   AST 22  --   --   --   --   ALT 23  --   --   --   --   ALKPHOS 84  --   --   --   --   BILITOT 0.8  --   --   --   --   GFRNONAA 42*   < > 43* 50* 53*  GFRAA 49*   < > 50* 58* >60  ANIONGAP 16*   < > 12 9 12    < > = values in this interval not displayed.     Hematology Recent Labs  Lab 09/16/17 0429 09/17/17 0417 09/18/17 0411  WBC 10.0 12.2* 13.6*  RBC 4.66 4.71 4.60  HGB 13.4 13.5 13.1  HCT 42.1 43.1 41.8  MCV 90.3 91.5 90.9  MCH 28.8 28.7 28.5  MCHC 31.8 31.3 31.3  RDW 15.4 15.0 14.9  PLT 231 224 212    Cardiac Enzymes Recent Labs  Lab 09/14/17 1349 09/14/17 1936 09/15/17 0131  TROPONINI 0.21* 0.20* 0.18*     BNP Recent Labs  Lab 09/14/17 1333  BNP 501.7*     Radiology    Ct Head Wo Contrast  Result Date: 09/17/2017 CLINICAL DATA:  47 year old female with history of dizziness and headache for 2 days. EXAM: CT HEAD WITHOUT CONTRAST TECHNIQUE: Contiguous axial images were obtained from the base of the skull through the vertex without intravenous contrast. COMPARISON:  None. FINDINGS: Brain: No evidence of acute infarction, hemorrhage, hydrocephalus, extra-axial collection or mass lesion/mass effect. Vascular: No hyperdense vessel or unexpected calcification. Skull: Normal. Negative for fracture or focal lesion. Sinuses/Orbits: No acute finding. Small mucosal retention cyst versus polyp in the lateral aspect of the right maxillary sinus incidentally noted. Other: None. IMPRESSION: 1. No acute intracranial abnormalities. The appearance of the brain is normal. Electronically Signed   By: 11/17/2017 M.D.   On: 09/17/2017 15:24    Cardiac Studies   CATH: ordered  Patient Profile     47 y.o. female with acute on chronic congestive heart failure w/ EF 30-35% per echo 09/13/2017 -possibly related to Takotsubo syndrome.  Hx COPD on home O2, morbid obesity. Tx from [facility] for heart catheterization.  She was found to  have influenza A.  Assessment & Plan    1.  Acute on chronic combined systolic and diastolic congestive heart failure: T - wt is stable off Lasix, 2 gm  Na diet - patient has severe COPD and is on home oxygen.  - She has been found to have influenza A.  -  Echocardiogram at [facility] shows ejection fraction of 30-35%.  ?takotsubo syndrome vs. Ischemic heart disease.  - on heparin, ASA, Coreg 6.25 mg bid - on high-dose statin  - MD advise if should be R/L cath - Per Pharmacy, the Solu-medrol is enough if Benadryl added>>done - Heart catheterization today  2.  Acute on chronic renal insufficiency:    - Renal function has improved.  - Continue to follow. May need to do cors only and stage PCI if needed  3. COPD exacerbation, Flu A - She is improved, give neb before cath - continue rx per IM   For questions or updates, please contact [facility] Please consult [email or web address] for contact info under Cardiology/STEMI.      Signed, Theodore Demark, PA-C  09/18/2017, 8:01 AM

## 2017-09-18 NOTE — Progress Notes (Signed)
Text paged [facility] since patient was extremely anxious at this time, attempted to calm her with breathing techs but unable to calm her. Pt was taking Xanax 0.25 mg at bedtime for the last several nights, but it was discontinued, will try to get a dose for tonight. Pt's B/P remains elevated ane she was given 10 MG of IV Hydralazine. Pt keeps looking at the monitor and she was instructed to try to relax and to take deep breaths with no success. Pt C/O feeling like her heart is going to come out of her chest and her head is going to explode, will await further orders and sit with patient til she calms down, will continue to monitor.

## 2017-09-18 NOTE — Progress Notes (Signed)
PROGRESS NOTE   Katie Rasmussen  OIZ:[id number]    DOB: 1971/04/21    DOA: 09/14/2017  PCP: Patient, No Pcp Per   I have briefly reviewed patients previous medical records in [facility].  Brief Narrative:  47 Body mass index is 43.74 kg/m. ambulates with the help of a cane,COPD, nocturnal hypoxia/OHS/chronic hypoxic respiratory failure on 3 L/min at bedtime (waiting to see Dr. Williams Che, Pulmonology), morbid obesity, chronic combined CHF, tobacco abuse initially presented to [facility] with cough, dyspnea, subjective fevers, multiple family members sick with URTI symptoms, progressively got worse and drove herself to [facility] where she nearly passed out in the parking lot and was cyanotic, acute on chronic hypoxic and hypercapnic respiratory failure (pH of 7.1), tried on BiPAP with Lasix, antimicrobial therapy, liberated off BiPAP, noted elevated troponin, echo showed new reduced EF 30-35% with wall motion abnormalities with concern for Takotsubo cardiomyopathy and transferred to [facility] for cardiology consultation.  Ruled in for influenza A.  Cardiology consulted.  Improving but slowly.   Assessment & Plan:   Principal Problem:   Acute combined systolic and diastolic CHF, NYHA class 3 (HCC) Active Problems:   Acute on chronic respiratory failure with hypoxia and hypercapnia (HCC)   Pneumonia   Obesity hypoventilation syndrome (HCC)   COPD (chronic obstructive pulmonary disease) (HCC)   Bronchitis due to tobacco use (HCC)   DCM (dilated cardiomyopathy) (HCC)   Elevated troponin   Acute on chronic hypoxic and hypercapnic respiratory failure: Likely precipitated by influenza A acute bronchitis causing COPD exacerbation and decompensated CHF complicating underlying OHS.  Initially admitted to [facility] ICU and was on BiPAP.  Now off of BiPAP and saturating 96-97% on 3 L/min. Chest x-ray 3/9 personally reviewed: No acute findings.  desat screen pending  Influenza A  acute bronchitis/COPD exacerbation: RSV panel positive for influenza A H3.  Started Tamiflu, complete 5 days course 3/13  Scheduled and as needed bronchodilator nebulizations.  Changed oral prednisone to IV Solu-Medrol 60 mg every 8 hourly.  Scheduled Mucinex DM and as needed Tessalon for cough.  Continue oral doxycycline for now. ?  Prednisone dependent PTA (was not on a fixed small dose daily but had been on frequent courses of steroids over the last year).  Improving but slowly, continue current dose of Solu-Medrol and wean by d/c home  Acute on chronic combined systolic and diastolic CHF: Cardiology consultation appreciated.  2D echo at [facility] showed EF 30- 35%.= ischemic heart disease.  Treated with IV Lasix.   euvolemic and due to increase in creatinine, Lasix discontinued.  Also held lisinopril.  Cath shows disease and was stented on 3/12--per cardiology re: meds on resumption and d/c home--currently on coreg 6.25 bid and amlodipine 10 od  Elevated troponin: as above Cardiac cath +  Acute on stage III chronic kidney disease: Creatinine has worsened from 1.45-1.71.  Discontinued Lasix and lisinopril.  Creatinine has improved to 1.4 >1.25> 1.20.   OHS/nocturnal hypoxemia/chronic respiratory failure: Reports that she has never had a sleep study done.  Needs outpatient sleep study.  Hypertensive urgency: Blood pressures have improved.  Carvedilol increased to 6.25 twice daily.  Discontinued lisinopril due to worsening creatinine.  Uncontrolled.  Adjusted medications as above.  Added IV hydralazine as needed.  Hypothyroid: Continue Synthroid.  Morbid obesity/Body mass index is 43.74 kg/m.  Tobacco abuse: Cessation counseled.  Nicotine patch.  Hyperglycemia: Likely due to steroids.  Check A1c and SSI as needed.  Mildly uncontrolled at times.  Dizziness: Mostly in upright position and?  Vertigo.  Check orthostatic blood pressures.  No focal deficits.?  Sinus congestion versus BPPV.   Symptoms only in the last 36-48 hours, did not mention it yesterday. Had neg CT head without contrast 3/11  Oral thrush: Continue nystatin.    DVT prophylaxis: IV heparin Code Status: Full Family Communication: None at bedside Disposition: DC home pending clinical improvement per cardiology   Consultants:  Cardiology  Procedures:  BiPAP, now off   Cath 3/12 Conclusion   1. Severe single vessel CAD involving a 75% eccentric proximal LAD stenosis, treated successfully with a single DES (3.0x18 mm Xience [place]) 2. Patent Left Main, LCx, and RCA with minimal nonobstructive disease 3. Severely elevated LVEDP 4. Moderate LV systolic dysfunction with LVEF estimated at 40-45%  Recommend: DAPT with ASA and clopidogrel x 12 months without interruption, med Rx for CHF, secondary risk reduct     Antimicrobials:  Doxycycline Tamiflu   Subjective:   Overall fair anxious about blood pressure No cp No fever no chills  Objective:  Vitals:   09/18/17 1255 09/18/17 1310 09/18/17 1315 09/18/17 1355  BP: (!) 222/86 (!) 217/81 (!) 199/76 (!) 185/88  Pulse: 75 73 79 92  Resp: (!) 30 18 17 17   Temp:    (!) 97.5 F (36.4 C)  TempSrc:    Oral  SpO2: 96% 95% 95% 95%  Weight:      Height:        Examination:  eomi ncat flat affect No rales no rhonchi but some wheeze s1 s2 no m/r/g abd soft nt nd no rebound no guard Neuro intact Psych anxious  Data Reviewed: I have personally reviewed following labs and imaging studies  CBC: Recent Labs  Lab 09/14/17 1349 09/16/17 0429 09/17/17 0417 09/18/17 0411  WBC 15.5* 10.0 12.2* 13.6*  NEUTROABS 14.2*  --   --   --   HGB 13.3 13.4 13.5 13.1  HCT 41.4 42.1 43.1 41.8  MCV 89.6 90.3 91.5 90.9  PLT 253 231 224 212   Basic Metabolic Panel: Recent Labs  Lab 09/14/17 1349 09/15/17 0131 09/16/17 0429 09/17/17 0417 09/18/17 0411  NA 138 137 137 137 138  K 4.0 3.9 4.7 4.6 5.0  CL 96* 93* 97* 100* 103  CO2 26 28 28 28 23    GLUCOSE 146* 128* 172* 152* 177*  BUN 26* 41* 49* 41* 39*  CREATININE 1.45* 1.71* 1.43* 1.25* 1.20*  CALCIUM 8.7* 8.5* 8.9 9.0 8.9  MG 2.2 2.9*  --   --   --    Liver Function Tests: Recent Labs  Lab 09/14/17 1349  AST 22  ALT 23  ALKPHOS 84  BILITOT 0.8  PROT 6.7  ALBUMIN 3.2*   Coagulation Profile: Recent Labs  Lab 09/14/17 1349 09/17/17 0417  INR 0.96 1.00   Cardiac Enzymes: Recent Labs  Lab 09/14/17 1349 09/14/17 1936 09/15/17 0131  TROPONINI 0.21* 0.20* 0.18*   HbA1C: No results for input(s): HGBA1C in the last 72 hours. CBG: Recent Labs  Lab 09/16/17 1621 09/16/17 2046 09/17/17 0923 09/17/17 1132 09/17/17 2207  GLUCAP 162* 231* 138* 195* 251*    Recent Results (from the past 240 hour(s))  Culture, expectorated sputum-assessment     Status: None   Collection Time: 09/14/17  5:49 AM  Result Value Ref Range Status   Specimen Description EXPECTORATED SPUTUM  Final   Special Requests   Final    NONE Performed at [facility], [address] [place], [place] [address]    Sputum evaluation THIS SPECIMEN IS ACCEPTABLE FOR SPUTUM CULTURE  Final   Report Status 09/15/2017 FINAL  Final  Culture, respiratory (NON-Expectorated)     Status: None   Collection Time: 09/14/17  5:49 AM  Result Value Ref Range Status   Specimen Description EXPECTORATED SPUTUM  Final   Special Requests NONE Reflexed from [id number]  Final   Gram Stain   Final    MODERATE WBC PRESENT, PREDOMINANTLY PMN RARE SQUAMOUS EPITHELIAL CELLS PRESENT RARE GRAM POSITIVE COCCI    Culture   Final    Consistent with normal respiratory flora. Performed at [facility], [address] [place], [place] [address]    Report Status 09/17/2017 FINAL  Final  Respiratory Panel by PCR     Status: Abnormal   Collection Time: 09/14/17  1:33 PM  Result Value Ref Range Status   Adenovirus NOT DETECTED NOT DETECTED Final   Coronavirus 229E NOT DETECTED NOT DETECTED Final   Coronavirus HKU1  NOT DETECTED NOT DETECTED Final   Coronavirus NL63 NOT DETECTED NOT DETECTED Final   Coronavirus OC43 NOT DETECTED NOT DETECTED Final   Metapneumovirus NOT DETECTED NOT DETECTED Final   Rhinovirus / Enterovirus NOT DETECTED NOT DETECTED Final   Influenza A H3 DETECTED (A) NOT DETECTED Final   Influenza B NOT DETECTED NOT DETECTED Final   Parainfluenza Virus 1 NOT DETECTED NOT DETECTED Final   Parainfluenza Virus 2 NOT DETECTED NOT DETECTED Final   Parainfluenza Virus 3 NOT DETECTED NOT DETECTED Final   Parainfluenza Virus 4 NOT DETECTED NOT DETECTED Final   Respiratory Syncytial Virus NOT DETECTED NOT DETECTED Final   Bordetella pertussis NOT DETECTED NOT DETECTED Final   Chlamydophila pneumoniae NOT DETECTED NOT DETECTED Final   Mycoplasma pneumoniae NOT DETECTED NOT DETECTED Final    Comment: Performed at [facility], [address], [place], [place] [address]  MRSA PCR Screening     Status: None   Collection Time: 09/14/17  1:33 PM  Result Value Ref Range Status   MRSA by PCR NEGATIVE NEGATIVE Final    Comment:        The GeneXpert MRSA Assay (FDA approved for NASAL specimens only), is one component of a comprehensive MRSA colonization surveillance program. It is not intended to diagnose MRSA infection nor to guide or monitor treatment for MRSA infections. Performed at [facility], [address], [place], [place] [address]          Radiology Studies: Ct Head Wo Contrast  Result Date: 09/17/2017 CLINICAL DATA:  47 year old female with history of dizziness and headache for 2 days. EXAM: CT HEAD WITHOUT CONTRAST TECHNIQUE: Contiguous axial images were obtained from the base of the skull through the vertex without intravenous contrast. COMPARISON:  None. FINDINGS: Brain: No evidence of acute infarction, hemorrhage, hydrocephalus, extra-axial collection or mass lesion/mass effect. Vascular: No hyperdense vessel or unexpected calcification. Skull: Normal. Negative  for fracture or focal lesion. Sinuses/Orbits: No acute finding. Small mucosal retention cyst versus polyp in the lateral aspect of the right maxillary sinus incidentally noted. Other: None. IMPRESSION: 1. No acute intracranial abnormalities. The appearance of the brain is normal. Electronically Signed   By: Trudie Reed M.D.   On: 09/17/2017 15:24        Scheduled Meds: . amLODipine  10 mg Oral Daily  . aspirin EC  81 mg Oral Daily  . atorvastatin  80 mg Oral q1800  . carvedilol  6.25 mg Oral BID WC  . [START ON 09/19/2017] clopidogrel  75 mg Oral Q breakfast  . dextromethorphan-guaiFENesin  1 tablet Oral BID  . doxycycline  100 mg Oral Q12H  . fluticasone  2 spray Each Nare Daily  . ipratropium-albuterol  3 mL Nebulization QID  . levothyroxine  175 mcg Oral QAC breakfast  . methylPREDNISolone (SOLU-MEDROL) injection  60 mg Intravenous Q8H  . mometasone-formoterol  2 puff Inhalation BID  . nicotine  14 mg Transdermal Daily  . nystatin  5 mL Oral QID  . oseltamivir  30 mg Oral BID  . sodium chloride flush  3 mL Intravenous Q12H   Continuous Infusions: . sodium chloride    . sodium chloride 75 mL/hr at 09/18/17 1400  . sodium chloride    . nitroGLYCERIN 20 mcg/min (09/18/17 1410)     LOS: 4 days     Pleas Koch, MD [facility]   If 7PM-7AM, please contact night-coverage [email or web address] Password [email or web address] 09/18/2017, 2:40 PM

## 2017-09-18 NOTE — Progress Notes (Signed)
Pt resting quietly at this time, will continue to monitor,

## 2017-09-18 NOTE — Progress Notes (Signed)
TR BAND REMOVAL  LOCATION:    left radial  DEFLATED PER PROTOCOL:    Yes.    TIME BAND OFF / DRESSING APPLIED:    1645   SITE UPON ARRIVAL:    Level 1( bruise)  SITE AFTER BAND REMOVAL:    Level 1( bruise)  CIRCULATION SENSATION AND MOVEMENT:    Within Normal Limits   Yes.    COMMENTS:   Tolerated procedure well

## 2017-09-18 NOTE — Progress Notes (Signed)
Inpatient Diabetes Program Recommendations  AACE/ADA: New Consensus Statement on Inpatient Glycemic Control (2015)  Target Ranges:  Prepandial:   less than 140 mg/dL      Peak postprandial:   less than 180 mg/dL (1-2 hours)      Critically ill patients:  140 - 180 mg/dL   Results for LAKESIA, DAHLE (MRN [id number]) as of 09/18/2017 09:05  Ref. Range 09/17/2017 09:23 09/17/2017 11:32 09/17/2017 22:07  Glucose-Capillary Latest Ref Range: 65 - 99 mg/dL 494 (H) 496 (H) 759 (H)   Results for JANISSA, BERTRAM (MRN [id number]) as of 09/18/2017 09:05  Ref. Range 09/18/2017 04:11  Glucose Latest Ref Range: 65 - 99 mg/dL 935 (H)   Review of Glycemic Control  Diabetes history: None Current orders for Inpatient glycemic control: None  Inpatient Diabetes Program Recommendations:    Glucose trends elevated while patient is on IV Solumedrol 60 mg Q8 hours. Please consider Novolog Sensitive Correction 0-9 units tid.  Thanks,  Christena Deem RN, MSN, BC-ADM, [facility] Inpatient Diabetes Coordinator Team Pager [phone] (8a-5p)

## 2017-09-18 NOTE — Progress Notes (Signed)
TRBAND REMOVAL  LOCATION:    right radial  DEFLATED PER PROTOCOL:    Yes.    TIME BAND OFF / DRESSING APPLIED:    1645   SITE UPON ARRIVAL:    Level 0  SITE AFTER BAND REMOVAL:    Level 0  CIRCULATION SENSATION AND MOVEMENT:    Within Normal Limits   Yes.    COMMENTS:   Tolerated procedure well 

## 2017-09-18 NOTE — Interval H&P Note (Signed)
History and Physical Interval Note:  09/18/2017 8:48 AM  Katie Rasmussen  has presented today for surgery, with the diagnosis of cp  The various methods of treatment have been discussed with the patient and family. After consideration of risks, benefits and other options for treatment, the patient has consented to  Procedure(s): LEFT HEART CATH AND CORONARY ANGIOGRAPHY (N/A) as a surgical intervention .  The patient's history has been reviewed, patient examined, no change in status, stable for surgery.  I have reviewed the patient's chart and labs.  Questions were answered to the patient's satisfaction.     Tonny Bollman

## 2017-09-19 ENCOUNTER — Other Ambulatory Visit: Payer: Self-pay

## 2017-09-19 ENCOUNTER — Encounter (HOSPITAL_COMMUNITY): Payer: Self-pay | Admitting: General Practice

## 2017-09-19 DIAGNOSIS — I2510 Atherosclerotic heart disease of native coronary artery without angina pectoris: Secondary | ICD-10-CM

## 2017-09-19 DIAGNOSIS — E7800 Pure hypercholesterolemia, unspecified: Secondary | ICD-10-CM

## 2017-09-19 DIAGNOSIS — I2583 Coronary atherosclerosis due to lipid rich plaque: Secondary | ICD-10-CM

## 2017-09-19 LAB — RENAL FUNCTION PANEL
Albumin: 2.9 g/dL — ABNORMAL LOW (ref 3.5–5.0)
Anion gap: 10 (ref 5–15)
BUN: 37 mg/dL — ABNORMAL HIGH (ref 6–20)
CO2: 22 mmol/L (ref 22–32)
CREATININE: 1.28 mg/dL — AB (ref 0.44–1.00)
Calcium: 8.8 mg/dL — ABNORMAL LOW (ref 8.9–10.3)
Chloride: 105 mmol/L (ref 101–111)
GFR, EST AFRICAN AMERICAN: 57 mL/min — AB (ref >60)
GFR, EST NON AFRICAN AMERICAN: 49 mL/min — AB (ref >60)
Glucose, Bld: 182 mg/dL — ABNORMAL HIGH (ref 65–99)
POTASSIUM: 5.5 mmol/L — AB (ref 3.5–5.1)
Phosphorus: 3.5 mg/dL (ref 2.5–4.6)
Sodium: 137 mmol/L (ref 135–145)

## 2017-09-19 LAB — CBC
HCT: 41.6 % (ref 36.0–46.0)
Hemoglobin: 12.9 g/dL (ref 12.0–15.0)
MCH: 28.4 pg (ref 26.0–34.0)
MCHC: 31 g/dL (ref 30.0–36.0)
MCV: 91.4 fL (ref 78.0–100.0)
PLATELETS: 217 10*3/uL (ref 150–400)
RBC: 4.55 MIL/uL (ref 3.87–5.11)
RDW: 15.3 % (ref 11.5–15.5)
WBC: 11.3 10*3/uL — AB (ref 4.0–10.5)

## 2017-09-19 LAB — BASIC METABOLIC PANEL
ANION GAP: 12 (ref 5–15)
BUN: 38 mg/dL — ABNORMAL HIGH (ref 6–20)
CO2: 21 mmol/L — ABNORMAL LOW (ref 22–32)
Calcium: 9 mg/dL (ref 8.9–10.3)
Chloride: 104 mmol/L (ref 101–111)
Creatinine, Ser: 1.32 mg/dL — ABNORMAL HIGH (ref 0.44–1.00)
GFR calc Af Amer: 55 mL/min — ABNORMAL LOW (ref >60)
GFR calc non Af Amer: 47 mL/min — ABNORMAL LOW (ref >60)
GLUCOSE: 200 mg/dL — AB (ref 65–99)
POTASSIUM: 5.5 mmol/L — AB (ref 3.5–5.1)
Sodium: 137 mmol/L (ref 135–145)

## 2017-09-19 MED ORDER — FUROSEMIDE 20 MG PO TABS
20.0000 mg | ORAL_TABLET | Freq: Once | ORAL | Status: AC
  Administered 2017-09-19: 11:00:00 20 mg via ORAL
  Filled 2017-09-19: qty 1

## 2017-09-19 MED ORDER — FUROSEMIDE 40 MG PO TABS
40.0000 mg | ORAL_TABLET | Freq: Every day | ORAL | Status: DC
  Administered 2017-09-20: 40 mg via ORAL
  Filled 2017-09-19: qty 1

## 2017-09-19 MED ORDER — SODIUM POLYSTYRENE SULFONATE PO POWD
15.0000 g | Freq: Once | ORAL | Status: AC
  Administered 2017-09-19: 14:00:00 15 g via ORAL
  Filled 2017-09-19: qty 15

## 2017-09-19 MED ORDER — HYDRALAZINE HCL 50 MG PO TABS
100.0000 mg | ORAL_TABLET | Freq: Three times a day (TID) | ORAL | Status: DC
  Administered 2017-09-19 – 2017-09-20 (×3): 100 mg via ORAL
  Filled 2017-09-19 (×3): qty 2

## 2017-09-19 MED ORDER — ANGIOPLASTY BOOK
Freq: Once | Status: AC
  Administered 2017-09-19: 04:00:00
  Filled 2017-09-19: qty 1

## 2017-09-19 MED ORDER — FUROSEMIDE 20 MG PO TABS
20.0000 mg | ORAL_TABLET | Freq: Once | ORAL | Status: AC
  Administered 2017-09-19: 20 mg via ORAL
  Filled 2017-09-19: qty 1

## 2017-09-19 MED ORDER — PREDNISONE 20 MG PO TABS
40.0000 mg | ORAL_TABLET | Freq: Every day | ORAL | Status: DC
  Administered 2017-09-20: 40 mg via ORAL
  Filled 2017-09-19: qty 2

## 2017-09-19 MED ORDER — LIVING BETTER WITH HEART FAILURE BOOK
Freq: Once | Status: AC
  Administered 2017-09-19: 04:00:00

## 2017-09-19 MED ORDER — HEART ATTACK BOUNCING BOOK
Freq: Once | Status: AC
  Administered 2017-09-19: 04:00:00
  Filled 2017-09-19: qty 1

## 2017-09-19 MED ORDER — METOPROLOL SUCCINATE ER 50 MG PO TB24
50.0000 mg | ORAL_TABLET | Freq: Every day | ORAL | Status: DC
  Administered 2017-09-19 – 2017-09-20 (×2): 50 mg via ORAL
  Filled 2017-09-19 (×2): qty 1

## 2017-09-19 MED FILL — Heparin Sodium (Porcine) 2 Unit/ML in Sodium Chloride 0.9%: INTRAMUSCULAR | Qty: 1000 | Status: AC

## 2017-09-19 MED FILL — Verapamil HCl IV Soln 2.5 MG/ML: INTRAVENOUS | Qty: 2 | Status: AC

## 2017-09-19 NOTE — Progress Notes (Addendum)
Progress Note  Patient Name: Katie Rasmussen Date of Encounter: 09/19/2017  Primary Cardiologist: No primary care provider on file.   Subjective   Denies CP or SOB.  Complains of severe HA from the NTG.  Very upset over HA which is likley from her NTG  Inpatient Medications    Scheduled Meds: . amLODipine  10 mg Oral Daily  . aspirin EC  81 mg Oral Daily  . atorvastatin  80 mg Oral q1800  . carvedilol  6.25 mg Oral BID WC  . clopidogrel  75 mg Oral Q breakfast  . dextromethorphan-guaiFENesin  1 tablet Oral BID  . doxycycline  100 mg Oral Q12H  . fluticasone  2 spray Each Nare Daily  . ipratropium-albuterol  3 mL Nebulization BID  . levothyroxine  175 mcg Oral QAC breakfast  . methylPREDNISolone (SOLU-MEDROL) injection  60 mg Intravenous Q8H  . mometasone-formoterol  2 puff Inhalation BID  . nicotine  14 mg Transdermal Daily  . nystatin  5 mL Oral QID  . oseltamivir  30 mg Oral BID  . sodium chloride flush  3 mL Intravenous Q12H   Continuous Infusions: . sodium chloride    . sodium chloride    . nitroGLYCERIN 20 mcg/min (09/18/17 1410)   PRN Meds: sodium chloride, sodium chloride, acetaminophen, albuterol, ALPRAZolam, benzonatate, hydrALAZINE, HYDROcodone-acetaminophen, ondansetron (ZOFRAN) IV, simethicone, sodium chloride flush   Vital Signs    Vitals:   09/18/17 2100 09/18/17 2200 09/19/17 0406 09/19/17 0830  BP: (!) 171/67 (!) 168/51 (!) 156/69 (!) 185/83  Pulse: 82 76 74 87  Resp: (!) 24 (!) 22 18 17   Temp:   97.7 F (36.5 C) 98.1 F (36.7 C)  TempSrc:   Oral Oral  SpO2: 98% 98% 98% 100%  Weight:   231 lb 7.7 oz (105 kg)   Height:        Intake/Output Summary (Last 24 hours) at 09/19/2017 0933 Last data filed at 09/19/2017 0842 Gross per 24 hour  Intake 391.35 ml  Output 925 ml  Net -533.65 ml   Filed Weights   09/17/17 0410 09/18/17 0600 09/19/17 0406  Weight: 231 lb (104.8 kg) 231 lb 8 oz (105 kg) 231 lb 7.7 oz (105 kg)    Telemetry    NSR  - Personally Reviewed  ECG    NSR with T wave inversions in the anterior precordial leads and lateral leads - Personally Reviewed  Physical Exam   GEN: No acute distress.   Neck: No JVD Cardiac: RRR, no murmurs, rubs, or gallops.  Respiratory: Clear to auscultation bilaterally. GI: Soft, nontender, non-distended  MS: No edema; No deformity. Neuro:  Nonfocal  Psych: Normal affect   Labs    Chemistry Recent Labs  Lab 09/14/17 1349  09/17/17 0417 09/18/17 0411 09/19/17 0346  NA 138   < > 137 138 137  K 4.0   < > 4.6 5.0 5.5*  CL 96*   < > 100* 103 105  CO2 26   < > 28 23 22   GLUCOSE 146*   < > 152* 177* 182*  BUN 26*   < > 41* 39* 37*  CREATININE 1.45*   < > 1.25* 1.20* 1.28*  CALCIUM 8.7*   < > 9.0 8.9 8.8*  PROT 6.7  --   --   --   --   ALBUMIN 3.2*  --   --   --  2.9*  AST 22  --   --   --   --  ALT 23  --   --   --   --   ALKPHOS 84  --   --   --   --   BILITOT 0.8  --   --   --   --   GFRNONAA 42*   < > 50* 53* 49*  GFRAA 49*   < > 58* >60 57*  ANIONGAP 16*   < > 9 12 10    < > = values in this interval not displayed.     Hematology Recent Labs  Lab 09/17/17 0417 09/18/17 0411 09/19/17 0346  WBC 12.2* 13.6* 11.3*  RBC 4.71 4.60 4.55  HGB 13.5 13.1 12.9  HCT 43.1 41.8 41.6  MCV 91.5 90.9 91.4  MCH 28.7 28.5 28.4  MCHC 31.3 31.3 31.0  RDW 15.0 14.9 15.3  PLT 224 212 217    Cardiac Enzymes Recent Labs  Lab 09/14/17 1349 09/14/17 1936 09/15/17 0131  TROPONINI 0.21* 0.20* 0.18*   No results for input(s): TROPIPOC in the last 168 hours.   BNP Recent Labs  Lab 09/14/17 1333  BNP 501.7*     DDimer No results for input(s): DDIMER in the last 168 hours.   Radiology    Ct Head Wo Contrast  Result Date: 09/17/2017 CLINICAL DATA:  47 year old female with history of dizziness and headache for 2 days. EXAM: CT HEAD WITHOUT CONTRAST TECHNIQUE: Contiguous axial images were obtained from the base of the skull through the vertex without intravenous  contrast. COMPARISON:  None. FINDINGS: Brain: No evidence of acute infarction, hemorrhage, hydrocephalus, extra-axial collection or mass lesion/mass effect. Vascular: No hyperdense vessel or unexpected calcification. Skull: Normal. Negative for fracture or focal lesion. Sinuses/Orbits: No acute finding. Small mucosal retention cyst versus polyp in the lateral aspect of the right maxillary sinus incidentally noted. Other: None. IMPRESSION: 1. No acute intracranial abnormalities. The appearance of the brain is normal. Electronically Signed   By: 57 M.D.   On: 09/17/2017 15:24    Cardiac Studies   Cardiac cath 09/18/2017 Conclusion   1. Severe single vessel CAD involving a 75% eccentric proximal LAD stenosis, treated successfully with a single DES (3.0x18 mm Xience 11/18/2017) 2. Patent Left Main, LCx, and RCA with minimal nonobstructive disease 3. Severely elevated LVEDP 4. Moderate LV systolic dysfunction with LVEF estimated at 40-45%  Recommend: DAPT with ASA and clopidogrel x 12 months without interruption, med Rx for CHF, secondary risk reduction     Patient Profile     47 y.o. female with acute on chronic congestive heart failure w/ EF 30-35% per echo 09/13/2017 -possibly related to Takotsubo syndrome.  Hx COPD on home O2, morbid obesity. Tx from [facility] for heart catheterization.  She was found to have influenza A.  Assessment & Plan    1.  Acute on chronic combined systolic and diastolic congestive heart failure  - patient has severe COPD and is on home oxygen.  - She has been found to have influenza A likely exacerbating CHF.  - Echocardiogram at [facility] shows ejection fraction of 30-35%. - cath with moderate LV dysfunction with EF 40-45% and LVEDP was markedly increased consistent with combined systolic/diastolikc CHF. -order stat BMET since K 5.5.  If K+ remains elevated would start Hydralazine and not restart ACE I. -Since patient has had problems  with wheezing from her COPD/influenza she is refusing Coreg.  Willr switch carvedilol to a beta 1 selective beta-blocker Toprol XL 50mg  daily -Recommend repeat 2D echocardiogram in 2 months  status post revascularization to see if EF is improved. -We will place on Lasix 40 mg daily for elevated LVEDP. -She will need to be checked in 1 week.  2.  Elevated troponin -Peak troponin 0 0.21 with flat trend likely secondary to demand ischemia in the setting of influenza and underlying obstructive disease of the proximal LAD.  3.  Single-vessel CAD  - cath showed severe single-vessel coronary disease with a 75% eccentric proximal LAD stenosis status post single DES stent with no other significant CAD.  -Continue aspirin 81 mg daily, high-dose statin with Lipitor 80 mg daily, BB and Plavix 75 mg daily.  She will require DAPT for at least one year.  4.  Acute on chronic renal insufficiency:    - Renal function has improved.  - Creatinine stable at 1.28 post-cath.  5.. COPD exacerbation, Flu A - Per TRH  6.  Hypertension -Blood pressure remains poorly controlled.  Likely increased due to severe HA from NTG and increased steroids from COPD as well as an anxiety component -Continue amlodipine 10 mg daily -stop NTG gtt - change carvedilol to B1 selective Toprol XL 50mg  daily  7.  Hyperlipidemia with LDL goal less than 70 -She is on high-dose atorvastatin 80 mg daily -We will need FLP and ALT in 6 weeks  For questions or updates, please contact [facility] Please consult [email or web address] for contact info under Cardiology/STEMI.      Signed, Armanda Magic, MD  09/19/2017, 9:33 AM

## 2017-09-19 NOTE — Progress Notes (Signed)
PROGRESS NOTE   Katie Rasmussen  GDJ:[id number]    DOB: [birth year]    DOA: 09/14/2017  PCP: Paulina Fusi, MD   I have briefly reviewed patients previous medical records in [facility].  Brief Narrative:  47 Body mass index is 43.74 kg/m. ambulates with the help of a cane,COPD, nocturnal hypoxia/OHS/chronic hypoxic respiratory failure on 3 L/min at bedtime (waiting to see Dr. Williams Che, Pulmonology), morbid obesity, chronic combined CHF, tobacco abuse initially presented to [facility] with cough, dyspnea, subjective fevers, multiple family members sick with URTI symptoms, progressively got worse and drove herself to [facility] where she nearly passed out in the parking lot and was cyanotic, acute on chronic hypoxic and hypercapnic respiratory failure (pH of 7.1), tried on BiPAP with Lasix, antimicrobial therapy, liberated off BiPAP, noted elevated troponin, echo showed new reduced EF 30-35% with wall motion abnormalities with concern for Takotsubo cardiomyopathy and transferred to [facility] for cardiology consultation.  Ruled in for influenza A.  Cardiology consulted.  Improving but slowly.   Assessment & Plan:   Principal Problem:   Acute combined systolic and diastolic CHF, NYHA class 3 (HCC) Active Problems:   Acute on chronic respiratory failure with hypoxia and hypercapnia (HCC)   Pneumonia   Obesity hypoventilation syndrome (HCC)   COPD (chronic obstructive pulmonary disease) (HCC)   Bronchitis due to tobacco use (HCC)   DCM (dilated cardiomyopathy) (HCC)   Elevated troponin   Acute on chronic hypoxic and hypercapnic respiratory failure: Likely precipitated by influenza A acute bronchitis causing COPD exacerbation and decompensated CHF complicating underlying OHS.  Initially admitted to [facility] ICU and was on BiPAP.  Now off of BiPAP and saturating 96-97% on 3 L/min. Chest x-ray 3/9 personally reviewed: No acute findings.  desat screen still pending [only  uses oxygen at night]  Influenza A acute bronchitis/COPD exacerbation: RSV panel positive for influenza A H3.  Started Tamiflu, complete 5 days course 3/13  Scheduled and as needed bronchodilator nebulizations.  Changed oral prednisone to IV Solu-Medrol 60 mg every 8 hourly.  Scheduled Mucinex DM and as needed Tessalon for cough.  Continue oral doxycycline for now. ?  Prednisone dependent PTA (was not on a fixed small dose daily but had been on frequent courses of steroids over the last year). f Solu-Medrol and wean to prednisone 40 3/13   Acute on chronic combined systolic and diastolic CHF: Cardiology consultation appreciated.  2D echo at [facility] showed EF 30- 35%.= ischemic heart disease.  Treated with IV Lasix.   euvolemic and due to increase in creatinine, Lasix discontinued.  Also held lisinopril.  Cath shows disease and was stented on 3/12--per cardiology re: meds on resumption and d/c home--currently on coreg 6.25 bid and amlodipine 10 od--currently -.4.2 liters  Elevated troponin: as above Cardiac cath +  Acute on stage III chronic kidney disease: Creatinine has worsened from 1.45-1.71.  Discontinued Lasix and lisinopril.  Creatinine has improved to 1.4 >1.25> 1.20->1.3  OHS/nocturnal hypoxemia/chronic respiratory failure: Reports that she has never had a sleep study done.  Needs outpatient sleep study.  Hypertensive urgency: Blood pressures have improved.  Carvedilol increased to 6.25 twice daily.  Discontinued lisinopril due to worsening creatinine.  Uncontrolled.  Adjusted medications as above.  Added IV hydralazine as needed.  Adding scheduled Hydralazine 100 tid  Hypothyroid: Continue Synthroid.  Morbid obesity/Body mass index is 43.74 kg/m.  Tobacco abuse: Cessation counseled.  Nicotine patch.  Hyperglycemia: Likely due to steroids.  Check A1c and SSI  as needed.  Mildly uncontrolled at times.  Dizziness: Mostly in upright position and?  Vertigo.  Check orthostatic  blood pressures.  No focal deficits.?  Sinus congestion versus BPPV.  Symptoms only in the last 36-48 hours, did not mention it yesterday. Had neg CT head without contrast 3/11  Oral thrush: Continue nystatin.    DVT prophylaxis: IV heparin Code Status: Full Family Communication: None at bedside Disposition: DC home pending clinical improvement per cardiology   Consultants:  Cardiology  Procedures:  BiPAP, now off   Cath 3/12 Conclusion   1. Severe single vessel CAD involving a 75% eccentric proximal LAD stenosis, treated successfully with a single DES (3.0x18 mm Xience [place]) 2. Patent Left Main, LCx, and RCA with minimal nonobstructive disease 3. Severely elevated LVEDP 4. Moderate LV systolic dysfunction with LVEF estimated at 40-45%  Recommend: DAPT with ASA and clopidogrel x 12 months without interruption, med Rx for CHF, secondary risk reduct     Antimicrobials:  Doxycycline Tamiflu   Subjective:   Overall fair anxious about blood pressure Some sob  No fever no chills     Objective:  Vitals:   09/18/17 2200 09/19/17 0406 09/19/17 0830 09/19/17 1213  BP: (!) 168/51 (!) 156/69 (!) 185/83 (!) 215/90  Pulse: 76 74 87 90  Resp: (!) 22 18 17 19   Temp:  97.7 F (36.5 C) 98.1 F (36.7 C)   TempSrc:  Oral Oral   SpO2: 98% 98% 100% 99%  Weight:  105 kg (231 lb 7.7 oz)    Height:        Examination:  eomi ncat flat affect No rales no rhonchi, wheeze nnotably less s1 s2 no m/r/g abd soft nt nd no rebound no guard Neuro intact Psych anxious  Data Reviewed: I have personally reviewed following labs and imaging studies  CBC: Recent Labs  Lab 09/14/17 1349 09/16/17 0429 09/17/17 0417 09/18/17 0411 09/19/17 0346  WBC 15.5* 10.0 12.2* 13.6* 11.3*  NEUTROABS 14.2*  --   --   --   --   HGB 13.3 13.4 13.5 13.1 12.9  HCT 41.4 42.1 43.1 41.8 41.6  MCV 89.6 90.3 91.5 90.9 91.4  PLT 253 231 224 212 217   Basic Metabolic Panel: Recent Labs  Lab  09/14/17 1349 09/15/17 0131 09/16/17 0429 09/17/17 0417 09/18/17 0411 09/19/17 0346 09/19/17 0955  NA 138 137 137 137 138 137 137  K 4.0 3.9 4.7 4.6 5.0 5.5* 5.5*  CL 96* 93* 97* 100* 103 105 104  CO2 26 28 28 28 23 22  21*  GLUCOSE 146* 128* 172* 152* 177* 182* 200*  BUN 26* 41* 49* 41* 39* 37* 38*  CREATININE 1.45* 1.71* 1.43* 1.25* 1.20* 1.28* 1.32*  CALCIUM 8.7* 8.5* 8.9 9.0 8.9 8.8* 9.0  MG 2.2 2.9*  --   --   --   --   --   PHOS  --   --   --   --   --  3.5  --    Liver Function Tests: Recent Labs  Lab 09/14/17 1349 09/19/17 0346  AST 22  --   ALT 23  --   ALKPHOS 84  --   BILITOT 0.8  --   PROT 6.7  --   ALBUMIN 3.2* 2.9*   Coagulation Profile: Recent Labs  Lab 09/14/17 1349 09/17/17 0417  INR 0.96 1.00   Cardiac Enzymes: Recent Labs  Lab 09/14/17 1349 09/14/17 1936 09/15/17 0131  TROPONINI 0.21* 0.20* 0.18*  HbA1C: No results for input(s): HGBA1C in the last 72 hours. CBG: Recent Labs  Lab 09/16/17 1621 09/16/17 2046 09/17/17 0923 09/17/17 1132 09/17/17 2207  GLUCAP 162* 231* 138* 195* 251*    Recent Results (from the past 240 hour(s))  Culture, expectorated sputum-assessment     Status: None   Collection Time: 09/14/17  5:49 AM  Result Value Ref Range Status   Specimen Description EXPECTORATED SPUTUM  Final   Special Requests   Final    NONE Performed at [facility], [address], [place], [place] [address]    Sputum evaluation THIS SPECIMEN IS ACCEPTABLE FOR SPUTUM CULTURE  Final   Report Status 09/15/2017 FINAL  Final  Culture, respiratory (NON-Expectorated)     Status: None   Collection Time: 09/14/17  5:49 AM  Result Value Ref Range Status   Specimen Description EXPECTORATED SPUTUM  Final   Special Requests NONE Reflexed from [id number]  Final   Gram Stain   Final    MODERATE WBC PRESENT, PREDOMINANTLY PMN RARE SQUAMOUS EPITHELIAL CELLS PRESENT RARE GRAM POSITIVE COCCI    Culture   Final    Consistent with normal  respiratory flora. Performed at [facility], [address] [place], [place] [address]    Report Status 09/17/2017 FINAL  Final  Respiratory Panel by PCR     Status: Abnormal   Collection Time: 09/14/17  1:33 PM  Result Value Ref Range Status   Adenovirus NOT DETECTED NOT DETECTED Final   Coronavirus 229E NOT DETECTED NOT DETECTED Final   Coronavirus HKU1 NOT DETECTED NOT DETECTED Final   Coronavirus NL63 NOT DETECTED NOT DETECTED Final   Coronavirus OC43 NOT DETECTED NOT DETECTED Final   Metapneumovirus NOT DETECTED NOT DETECTED Final   Rhinovirus / Enterovirus NOT DETECTED NOT DETECTED Final   Influenza A H3 DETECTED (A) NOT DETECTED Final   Influenza B NOT DETECTED NOT DETECTED Final   Parainfluenza Virus 1 NOT DETECTED NOT DETECTED Final   Parainfluenza Virus 2 NOT DETECTED NOT DETECTED Final   Parainfluenza Virus 3 NOT DETECTED NOT DETECTED Final   Parainfluenza Virus 4 NOT DETECTED NOT DETECTED Final   Respiratory Syncytial Virus NOT DETECTED NOT DETECTED Final   Bordetella pertussis NOT DETECTED NOT DETECTED Final   Chlamydophila pneumoniae NOT DETECTED NOT DETECTED Final   Mycoplasma pneumoniae NOT DETECTED NOT DETECTED Final    Comment: Performed at [facility], [address] [place], [place] [address]  MRSA PCR Screening     Status: None   Collection Time: 09/14/17  1:33 PM  Result Value Ref Range Status   MRSA by PCR NEGATIVE NEGATIVE Final    Comment:        The GeneXpert MRSA Assay (FDA approved for NASAL specimens only), is one component of a comprehensive MRSA colonization surveillance program. It is not intended to diagnose MRSA infection nor to guide or monitor treatment for MRSA infections. Performed at [facility], [address], [place], [place] [address]          Radiology Studies: Ct Head Wo Contrast  Result Date: 09/17/2017 CLINICAL DATA:  47 year old female with history of dizziness and headache for 2 days. EXAM: CT  HEAD WITHOUT CONTRAST TECHNIQUE: Contiguous axial images were obtained from the base of the skull through the vertex without intravenous contrast. COMPARISON:  None. FINDINGS: Brain: No evidence of acute infarction, hemorrhage, hydrocephalus, extra-axial collection or mass lesion/mass effect. Vascular: No hyperdense vessel or unexpected calcification.  Skull: Normal. Negative for fracture or focal lesion. Sinuses/Orbits: No acute finding. Small mucosal retention cyst versus polyp in the lateral aspect of the right maxillary sinus incidentally noted. Other: None. IMPRESSION: 1. No acute intracranial abnormalities. The appearance of the brain is normal. Electronically Signed   By: Trudie Reed M.D.   On: 09/17/2017 15:24        Scheduled Meds: . amLODipine  10 mg Oral Daily  . aspirin EC  81 mg Oral Daily  . atorvastatin  80 mg Oral q1800  . clopidogrel  75 mg Oral Q breakfast  . dextromethorphan-guaiFENesin  1 tablet Oral BID  . doxycycline  100 mg Oral Q12H  . fluticasone  2 spray Each Nare Daily  . [START ON 09/20/2017] furosemide  40 mg Oral Daily  . hydrALAZINE  100 mg Oral Q8H  . ipratropium-albuterol  3 mL Nebulization BID  . levothyroxine  175 mcg Oral QAC breakfast  . methylPREDNISolone (SOLU-MEDROL) injection  60 mg Intravenous Q8H  . metoprolol succinate  50 mg Oral Daily  . mometasone-formoterol  2 puff Inhalation BID  . nicotine  14 mg Transdermal Daily  . nystatin  5 mL Oral QID  . sodium chloride flush  3 mL Intravenous Q12H  . sodium polystyrene  15 g Oral Once   Continuous Infusions: . sodium chloride    . sodium chloride       LOS: 5 days     Pleas Koch, MD [facility]   If 7PM-7AM, please contact night-coverage [email or web address] Password [email or web address] 09/19/2017, 12:55 PM

## 2017-09-19 NOTE — Progress Notes (Addendum)
CARDIAC REHAB PHASE I   PRE:  Rate/Rhythm: 98 ST  BP:  Sitting: 216/98    1035-1120 Pt hypertensive.  RN adjusting medications and will walk pt later.  Education completed with patient and spouse and all questions answered.  HF packet given to patient. Referral for CRPII will be made to [facility].   Nikki Dom, RN 09/19/2017 11:20 AM

## 2017-09-19 NOTE — Progress Notes (Signed)
Patient Resting O2 sat on room  Air 95%,  91-92 % O2 sat  Room air on ambulation ( approx. 30 feet,refused to walk longer due to feeling weak)  95-97 % O2 sat on room air , back to chair .

## 2017-09-20 ENCOUNTER — Other Ambulatory Visit: Payer: Self-pay | Admitting: Cardiology

## 2017-09-20 ENCOUNTER — Telehealth: Payer: Self-pay | Admitting: Cardiology

## 2017-09-20 DIAGNOSIS — I10 Essential (primary) hypertension: Secondary | ICD-10-CM

## 2017-09-20 DIAGNOSIS — J9622 Acute and chronic respiratory failure with hypercapnia: Secondary | ICD-10-CM

## 2017-09-20 DIAGNOSIS — J9621 Acute and chronic respiratory failure with hypoxia: Secondary | ICD-10-CM

## 2017-09-20 DIAGNOSIS — E785 Hyperlipidemia, unspecified: Secondary | ICD-10-CM

## 2017-09-20 DIAGNOSIS — I5043 Acute on chronic combined systolic (congestive) and diastolic (congestive) heart failure: Secondary | ICD-10-CM

## 2017-09-20 LAB — RENAL FUNCTION PANEL
ALBUMIN: 3.3 g/dL — AB (ref 3.5–5.0)
Anion gap: 12 (ref 5–15)
BUN: 37 mg/dL — AB (ref 6–20)
CALCIUM: 9.1 mg/dL (ref 8.9–10.3)
CO2: 24 mmol/L (ref 22–32)
CREATININE: 1.24 mg/dL — AB (ref 0.44–1.00)
Chloride: 102 mmol/L (ref 101–111)
GFR calc Af Amer: 59 mL/min — ABNORMAL LOW (ref >60)
GFR, EST NON AFRICAN AMERICAN: 51 mL/min — AB (ref >60)
Glucose, Bld: 134 mg/dL — ABNORMAL HIGH (ref 65–99)
PHOSPHORUS: 3.4 mg/dL (ref 2.5–4.6)
Potassium: 4.2 mmol/L (ref 3.5–5.1)
SODIUM: 138 mmol/L (ref 135–145)

## 2017-09-20 MED ORDER — LISINOPRIL 10 MG PO TABS
10.0000 mg | ORAL_TABLET | Freq: Every day | ORAL | Status: DC
  Administered 2017-09-20: 10 mg via ORAL
  Filled 2017-09-20: qty 1

## 2017-09-20 MED ORDER — IPRATROPIUM-ALBUTEROL 0.5-2.5 (3) MG/3ML IN SOLN
3.0000 mL | Freq: Three times a day (TID) | RESPIRATORY_TRACT | Status: DC
  Administered 2017-09-20: 08:00:00 3 mL via RESPIRATORY_TRACT
  Filled 2017-09-20 (×2): qty 3

## 2017-09-20 MED ORDER — ATORVASTATIN CALCIUM 80 MG PO TABS: 80.0000 mg | ORAL_TABLET | Freq: Every day | ORAL | 0 refills | Status: DC

## 2017-09-20 MED ORDER — HYDRALAZINE HCL 100 MG PO TABS: 100.0000 mg | ORAL_TABLET | Freq: Three times a day (TID) | ORAL | 0 refills | Status: DC

## 2017-09-20 MED ORDER — ASPIRIN 81 MG PO TBEC: 81.0000 mg | DELAYED_RELEASE_TABLET | Freq: Every day | ORAL | Status: DC

## 2017-09-20 MED ORDER — CLOPIDOGREL BISULFATE 75 MG PO TABS: 75.0000 mg | ORAL_TABLET | Freq: Every day | ORAL | 0 refills | Status: DC

## 2017-09-20 MED ORDER — AMLODIPINE BESYLATE 10 MG PO TABS: 10.0000 mg | ORAL_TABLET | Freq: Every day | ORAL | 0 refills | Status: DC

## 2017-09-20 MED ORDER — METOPROLOL SUCCINATE ER 50 MG PO TB24: 50.0000 mg | ORAL_TABLET | Freq: Every day | ORAL | 0 refills | Status: DC

## 2017-09-20 MED ORDER — BENZONATATE 200 MG PO CAPS: 200.0000 mg | ORAL_CAPSULE | Freq: Three times a day (TID) | ORAL | 0 refills | Status: DC | PRN

## 2017-09-20 MED ORDER — PREDNISONE 20 MG PO TABS: 40.0000 mg | ORAL_TABLET | Freq: Every day | ORAL | 0 refills | Status: DC

## 2017-09-20 NOTE — Progress Notes (Signed)
Progress Note  Patient Name: Katie Rasmussen Date of Encounter: 09/20/2017  Primary Cardiologist: No primary care provider on file.   Subjective   Denies any chest pain.  Blood pressure remains elevated at 162/76 mmHg today  Inpatient Medications    Scheduled Meds: . amLODipine  10 mg Oral Daily  . aspirin EC  81 mg Oral Daily  . atorvastatin  80 mg Oral q1800  . clopidogrel  75 mg Oral Q breakfast  . dextromethorphan-guaiFENesin  1 tablet Oral BID  . fluticasone  2 spray Each Nare Daily  . furosemide  40 mg Oral Daily  . hydrALAZINE  100 mg Oral Q8H  . ipratropium-albuterol  3 mL Nebulization TID  . levothyroxine  175 mcg Oral QAC breakfast  . metoprolol succinate  50 mg Oral Daily  . mometasone-formoterol  2 puff Inhalation BID  . nicotine  14 mg Transdermal Daily  . nystatin  5 mL Oral QID  . predniSONE  40 mg Oral QAC breakfast  . sodium chloride flush  3 mL Intravenous Q12H   Continuous Infusions: . sodium chloride    . sodium chloride     PRN Meds: sodium chloride, sodium chloride, acetaminophen, albuterol, ALPRAZolam, benzonatate, hydrALAZINE, HYDROcodone-acetaminophen, ondansetron (ZOFRAN) IV, simethicone, sodium chloride flush   Vital Signs    Vitals:   09/20/17 0603 09/20/17 0617 09/20/17 0645 09/20/17 0700  BP: (!) 156/74 (!) 157/77 (!) 169/66 (!) 162/76  Pulse: 71 88 83 73  Resp: (!) 25 (!) 23 20 (!) 24  Temp:  98.1 F (36.7 C)  98.1 F (36.7 C)  TempSrc:  Oral  Oral  SpO2: 99% 98% 97% 98%  Weight:  231 lb 7.7 oz (105 kg)    Height:        Intake/Output Summary (Last 24 hours) at 09/20/2017 0817 Last data filed at 09/20/2017 0700 Gross per 24 hour  Intake 810 ml  Output 800 ml  Net 10 ml   Filed Weights   09/18/17 0600 09/19/17 0406 09/20/17 0617  Weight: 231 lb 8 oz (105 kg) 231 lb 7.7 oz (105 kg) 231 lb 7.7 oz (105 kg)    Telemetry    NSR - Personally Reviewed  ECG    No new EKG to review- Personally Reviewed  Physical Exam    GEN: No acute distress.   Neck: No JVD Cardiac: RRR, no murmurs, rubs, or gallops.  Respiratory: Clear to auscultation bilaterally. GI: Soft, nontender, non-distended  MS: No edema; No deformity. Neuro:  Nonfocal  Psych: Normal affect   Labs    Chemistry Recent Labs  Lab 09/14/17 1349  09/19/17 0346 09/19/17 0955 09/20/17 0220  NA 138   < > 137 137 138  K 4.0   < > 5.5* 5.5* 4.2  CL 96*   < > 105 104 102  CO2 26   < > 22 21* 24  GLUCOSE 146*   < > 182* 200* 134*  BUN 26*   < > 37* 38* 37*  CREATININE 1.45*   < > 1.28* 1.32* 1.24*  CALCIUM 8.7*   < > 8.8* 9.0 9.1  PROT 6.7  --   --   --   --   ALBUMIN 3.2*  --  2.9*  --  3.3*  AST 22  --   --   --   --   ALT 23  --   --   --   --   ALKPHOS 84  --   --   --   --  BILITOT 0.8  --   --   --   --   GFRNONAA 42*   < > 49* 47* 51*  GFRAA 49*   < > 57* 55* 59*  ANIONGAP 16*   < > 10 12 12    < > = values in this interval not displayed.     Hematology Recent Labs  Lab 09/17/17 0417 09/18/17 0411 09/19/17 0346  WBC 12.2* 13.6* 11.3*  RBC 4.71 4.60 4.55  HGB 13.5 13.1 12.9  HCT 43.1 41.8 41.6  MCV 91.5 90.9 91.4  MCH 28.7 28.5 28.4  MCHC 31.3 31.3 31.0  RDW 15.0 14.9 15.3  PLT 224 212 217    Cardiac Enzymes Recent Labs  Lab 09/14/17 1349 09/14/17 1936 09/15/17 0131  TROPONINI 0.21* 0.20* 0.18*   No results for input(s): TROPIPOC in the last 168 hours.   BNP Recent Labs  Lab 09/14/17 1333  BNP 501.7*     DDimer No results for input(s): DDIMER in the last 168 hours.   Radiology    No results found.  Cardiac Studies   Cardiac cath 09/18/2017 Conclusion   1. Severe single vessel CAD involving a 75% eccentric proximal LAD stenosis, treated successfully with a single DES (3.0x18 mm Xience [place]) 2. Patent Left Main, LCx, and RCA with minimal nonobstructive disease 3. Severely elevated LVEDP 4. Moderate LV systolic dysfunction with LVEF estimated at 40-45%  Recommend: DAPT with ASA and  clopidogrel x 12 months without interruption, med Rx for CHF, secondary risk reduction      Patient Profile     47 y.o. female with acute on chronic congestive heart failure w/ EF 30-35% per echo 09/13/2017 -possibly related to Takotsubo syndrome. Hx COPD on home O2, morbid obesity. Tx from [facility] for heart catheterization. She was found to have influenza A.  Assessment & Plan    1. Acute on chronic combined systolic and diastolic congestive heart failure  - patient has severe COPD and is on home oxygen.  - She has been found to have influenza A likely exacerbating CHF.  - Echocardiogram at [facility] shows ejection fraction of 30-35%. - cath with moderate LV dysfunction with EF 40-45% and LVEDP was markedly increased consistent with combined systolic/diastolikc CHF. -Potassium now normalized to 4.2. -We will restart home dose of lisinopril 10 mg daily -Since patient has had problems with wheezing from her COPD/influenza she is refused Coreg.  Now on beta 1 selective beta-blocker Toprol XL 50mg  daily -Recommend repeat 2D echocardiogram in 2 months status post revascularization to see if EF is improved. -She put out 800 cc yesterday and is net -4.48 L -Weight is down 6 pounds from admission -continue Lasix 40 mg daily for elevated LVEDP. -Creatinine stable at 1.24 -She will need BMET checked in 1 week.  2.  Elevated troponin -Peak troponin 0 0.21 with flat trend likely secondary to demand ischemia in the setting of influenza and underlying obstructive disease of the proximal LAD. -Troponin is trending downward  3.  Single-vessel CAD  - cath showed severe single-vessel coronary disease with a 75% eccentric proximal LAD stenosis status post single DES stent with no other significant CAD.    -She will require DA PT for at least one year -Continue aspirin 81 mg daily, Plavix 75 mg daily, beta-blocker and high-dose statin  4. Acute on chronic renal insufficiency:   -Renal function has improved. -Creatinine stable at 1.24 post-cath.  5.. COPD exacerbation, Flu A - Per TRH  6.  Hypertension -Blood pressure improved this morning but remains elevated at 162/76 mmHg.  Likely increased due to increased steroids from COPD as well as an anxiety component and being off some of her home medications for blood pressure control -Continue amlodipine 10 mg daily and Toprol XL 50 mg daily -Restart lisinopril 10 mg daily -this was her home dose which may be need to be titrated for better blood pressure control  7.  Hyperlipidemia with LDL goal less than 70 -She is on high-dose atorvastatin 80 mg daily -We will need FLP and ALT in 6 weeks  Other recommendations at this time.  We will sign off.  Please call with any questions.  We will arrange follow-up in our office.  For questions or updates, please contact [facility] Please consult [email or web address] for contact info under Cardiology/STEMI.      Signed, Armanda Magic, MD  09/20/2017, 8:17 AM

## 2017-09-20 NOTE — Care Management Note (Signed)
Case Management Note  Patient Details  Name: Katie Rasmussen MRN: [id number] Date of Birth: Feb 13, 1971  Subjective/Objective:   From home, she will need a rolling walker, she states would like to use [facility] , referral made to Lupita Leash, she will bring walker up to patient's room.  Patient states she is on home oxyen with [employer] in home patient only at night.  RN Nere checked ambulation sats and patient does not qualify for continous oyxgen.  She will continue using home oxygen the way she was using pta.  She is for dc today.                 Action/Plan: DC home today.   Expected Discharge Date:  09/20/17               Expected Discharge Plan:  Home/Self Care  In-House Referral:  NA  Discharge planning Services  CM Consult  Post Acute Care Choice:  Durable Medical Equipment Choice offered to:  Patient  DME Arranged:  Dan Humphreys rolling DME Agency:  [facility]  HH Arranged:  NA HH Agency:  NA  Status of Service:  Completed, signed off  If discussed at Long Length of Stay Meetings, dates discussed:    Additional Comments:  Leone Haven, RN 09/20/2017, 10:55 AM

## 2017-09-20 NOTE — Progress Notes (Signed)
CARDIAC REHAB PHASE I   PRE:  Rate/Rhythm: 97 SR    BP: sitting 185/86    SaO2: 92 RA  MODE:  Ambulation: 100  ft   POST:  Rate/Rhythm: 112 ST    BP: sitting 174/89     SaO2: 94 RA  Pt able to walk at slow pace with RW. Legs weak and wobbly at times but no LOB. Pt sts they are weak from being in the bed for 1 week. BP fairly stable. SaO2 92-94 RA. Pt sts she only wears O2 at night at home. She is happy that she finally has some relief from swelling and SOB. She is motivated to quit smoking and we had a long discussion of this. Her husband is trying to quit as well. Understands importance of Plavix. Encouraged more walking. [phone]   Harriet Masson CES, ACSM 09/20/2017 10:02 AM

## 2017-09-20 NOTE — Telephone Encounter (Signed)
Patient was discharged today from the hospital. I called her with information on her follow up appointment on April 4 at 9:00. She verbalized understanding.

## 2017-09-20 NOTE — Discharge Summary (Signed)
Physician Discharge Summary  Katie Rasmussen WGN:[id number] DOB: January 02, 1971 DOA: 09/14/2017  PCP: Paulina Fusi, MD  Admit date: 09/14/2017 Discharge date: 09/20/2017  Time spent: 35 minutes  Recommendations for Outpatient Follow-up:  1. Patient will need aspirin Plavix at least for a year and cardiac rehab on discharge 2. Ordering home walker on discharge 3. Needs basic metabolic panel in 1 week time and TSH in 1 month 4. Recommend titration of blood pressure meds depending on kidney function going forward 5. Suggest discontinuation of ongoing prednisone 6. Suggest outpatient sleep study given habitus   Discharge Diagnoses:  Principal Problem:   Acute combined systolic and diastolic CHF, NYHA class 3 (HCC) Active Problems:   Acute on chronic respiratory failure with hypoxia and hypercapnia (HCC)   Pneumonia   Obesity hypoventilation syndrome (HCC)   COPD (chronic obstructive pulmonary disease) (HCC)   Bronchitis due to tobacco use (HCC)   DCM (dilated cardiomyopathy) (HCC)   Elevated troponin   Discharge Condition: Improved  Diet recommendation: Heart healthy  Filed Weights   09/18/17 0600 09/19/17 0406 09/20/17 0617  Weight: 105 kg (231 lb 8 oz) 105 kg (231 lb 7.7 oz) 105 kg (231 lb 7.7 oz)    History of present illness:  47 Body mass index is 43.74 kg/m. ambulates with the help of a cane,COPD, nocturnal hypoxia/OHS/chronic hypoxic respiratory failure on 3 L/min at bedtime (waiting to see Dr. Williams Che, Pulmonology), morbid obesity, chronic combined CHF, tobacco abuse initially presented to [facility] with cough, dyspnea, subjective fevers, multiple family members sick with URTI symptoms, progressively got worse and drove herself to [facility] where she nearly passed out in the parking lot and was cyanotic, acute on chronic hypoxic and hypercapnic respiratory failure (pH of 7.1), tried on BiPAP with Lasix, antimicrobial therapy, liberated off BiPAP, noted  elevated troponin, echo showed new reduced EF 30-35% with wall motion abnormalities with concern for Takotsubo cardiomyopathy and transferred to [facility] for cardiology consultation.  Ruled in for influenza A.  Cardiology consulted.     Hospital Course:  Acute on chronic hypoxic and hypercapnic respiratory failure: Likely precipitated by influenza A acute bronchitis causing COPD exacerbation and decompensated CHF complicating underlying OHS.  Initially admitted to [facility] ICU and was on BiPAP.  Now off of BiPAP and saturating 96-97% on 3 L/min. Chest x-ray 3/9 personally reviewed: No acute findings.  desat screen showed that patient only needed oxygen at night  Influenza A acute bronchitis/COPD exacerbation: RSV panel positive for influenza A H3.  Started Tamiflu, complete 5 days course 3/13  Scheduled and as needed bronchodilator nebulizations.  Changed oral prednisone to IV Solu-Medrol 60 mg every 8 hourly.  Scheduled Mucinex DM and as needed Tessalon for cough.  Continue oral doxycycline for now. ?  Prednisone dependent PTA Solu-Medrol and wean to prednisone 40 3/13 and will give a taper on discharge   Acute on chronic combined systolic and diastolic CHF: Cardiology consultation appreciated.  2D echo at [facility] showed EF 30- 35%.= ischemic heart disease.  Treated with IV Lasix.   euvolemic and due to increase in creatinine, Lasix discontinued.  Also held lisinopril.  Cath shows disease and was stented on 3/12--per cardiology re: meds on resumption and d/c home--currently on coreg 6.25 bid and amlodipine 10 od--currently -.4.2 liters  Elevated troponin: as above Cardiac cath +  Acute on stage III chronic kidney disease: Creatinine has worsened from 1.45-1.71.  Discontinued Lasix and lisinopril.  Creatinine has improved to 1.4 >1.25> 1.20->1.3  OHS/nocturnal hypoxemia/chronic respiratory failure: Reports that she has never had a sleep study done.  Needs outpatient sleep  study.  Hypertensive urgency: Blood pressures have improved.  Carvedilol increased to 6.25 twice daily.  Discontinued lisinopril due to worsening creatinine.  Uncontrolled.  Adjusted medications as above.  Added IV hydralazine as needed.  Adding scheduled Hydralazine 100 tid, lisinopril 10 was re-added and will need to be uptitrated as an outpatient  Hypothyroid: Continue Synthroid.  Morbid obesity/Body mass index is 43.74 kg/m.  Tobacco abuse: Cessation counseled.  Nicotine patch.  Hyperglycemia: Likely due to steroids.    Stabilized in hospital  Dizziness: Mostly in upright position and?  Vertigo.  Check orthostatic blood pressures.  No focal deficits.?  Sinus congestion versus BPPV.    No further recurrence  Oral thrush: Continue nystatin.-This was discontinued on discharge    Consultants:  Cardiology  Procedures:  BiPAP, now off   Cath 3/12 Conclusion   1. Severe single vessel CAD involving a 75% eccentric proximal LAD stenosis, treated successfully with a single DES (3.0x18 mm Xience [place]) 2. Patent Left Main, LCx, and RCA with minimal nonobstructive disease 3. Severely elevated LVEDP 4. Moderate LV systolic dysfunction with LVEF estimated at 40-45%  Recommend: DAPT with ASA and clopidogrel x 12 months without interruption, med Rx for CHF, secondary risk reduct     Antimicrobials:  Doxycycline Tamiflu      Discharge Exam: Vitals:   09/20/17 0645 09/20/17 0700  BP: (!) 169/66 (!) 162/76  Pulse: 83 73  Resp: 20 (!) 24  Temp:  98.1 F (36.7 C)  SpO2: 97% 98%   Awake alert pleasant no distress oriented  General: Obese pleasant Cardiovascular:  S1-S2 no murmur rub or gallop regular rate rhythm Respiratory: clinically clear no added sound' Mild lower extremity edema  Discharge Instructions   Discharge Instructions    Diet - low sodium heart healthy   Complete by:  As directed    Discharge instructions   Complete by:  As directed     Please make sure that you take aspirin and Plavix regularly and he will need close follow-up with cardiology in [facility] Recommend  lab work done in about 1 week at your primary physician's office to check your kidney function We will order oxygen as well as a walker for you at home and get some home health on discharge Go ahead and also make sure that you see your lung doctor in the next 2-3 weeks as he had you on high dose of prednisone in the past   Increase activity slowly   Complete by:  As directed      Allergies as of 09/20/2017      Reactions   Iodine Hives, Itching   Shellfish Allergy Hives, Itching      Medication List    STOP taking these medications   ibuprofen 800 MG tablet Commonly known as:  ADVIL,MOTRIN     TAKE these medications   albuterol 108 (90 Base) MCG/ACT inhaler Commonly known as:  PROVENTIL HFA;VENTOLIN HFA Inhale 2 puffs into the lungs every 4 (four) hours as needed for wheezing or shortness of breath.   amLODipine 10 MG tablet Commonly known as:  NORVASC Take 1 tablet (10 mg total) by mouth daily. Start taking on:  09/21/2017   aspirin 81 MG EC tablet Take 1 tablet (81 mg total) by mouth daily. Start taking on:  09/21/2017   atorvastatin 80 MG tablet Commonly known as:  LIPITOR Take 1 tablet (80  mg total) by mouth daily at 6 PM.   benzonatate 200 MG capsule Commonly known as:  TESSALON Take 1 capsule (200 mg total) by mouth 3 (three) times daily as needed for cough.   clopidogrel 75 MG tablet Commonly known as:  PLAVIX Take 1 tablet (75 mg total) by mouth daily with breakfast. Start taking on:  09/21/2017   fluticasone furoate-vilanterol 100-25 MCG/INH Aepb Commonly known as:  BREO ELLIPTA Inhale 1 puff into the lungs daily.   furosemide 40 MG tablet Commonly known as:  LASIX Take 40 mg by mouth daily as needed for edema.   hydrALAZINE 100 MG tablet Commonly known as:  APRESOLINE Take 1 tablet (100 mg total) by mouth every 8 (eight)  hours.   ipratropium-albuterol 0.5-2.5 (3) MG/3ML Soln Commonly known as:  DUONEB Take 3 mLs by nebulization every 6 (six) hours as needed (short of breath).   levothyroxine 175 MCG tablet Commonly known as:  SYNTHROID, LEVOTHROID Take 175 mcg by mouth daily before breakfast.   lisinopril 20 MG tablet Commonly known as:  PRINIVIL,ZESTRIL Take 10 mg by mouth at bedtime.   metoprolol succinate 50 MG 24 hr tablet Commonly known as:  TOPROL-XL Take 1 tablet (50 mg total) by mouth daily. Take with or immediately following a meal. Start taking on:  09/21/2017   predniSONE 20 MG tablet Commonly known as:  DELTASONE Take 2 tablets (40 mg total) by mouth daily before breakfast. Start taking on:  09/21/2017 What changed:    medication strength  how much to take  when to take this            Durable Medical Equipment  (From admission, onward)        Start     Ordered   09/20/17 1012  For home use only DME oxygen  Once    Question:  Oxygen delivery system  Answer:  Gas   09/20/17 1011   09/20/17 1012  For home use only DME Walker wide  Once    Question:  Patient needs a walker to treat with the following condition  Answer:  Heart attack (HCC)   09/20/17 1011     Allergies  Allergen Reactions  . Iodine Hives and Itching  . Shellfish Allergy Hives and Itching   Follow-up Information    [facility] [address] Follow up.   Specialty:  Cardiology Why:  You will need labs in 1 week at our office. They will call you to schedule. Also cholesterol labs in 8 weeks at our office.  Contact information: [address] [place] [address] [phone]           The results of significant diagnostics from this hospitalization (including imaging, microbiology, ancillary and laboratory) are listed below for reference.    Significant Diagnostic Studies: Ct Head Wo Contrast  Result Date: 09/17/2017 CLINICAL DATA:  47 year old female with  history of dizziness and headache for 2 days. EXAM: CT HEAD WITHOUT CONTRAST TECHNIQUE: Contiguous axial images were obtained from the base of the skull through the vertex without intravenous contrast. COMPARISON:  None. FINDINGS: Brain: No evidence of acute infarction, hemorrhage, hydrocephalus, extra-axial collection or mass lesion/mass effect. Vascular: No hyperdense vessel or unexpected calcification. Skull: Normal. Negative for fracture or focal lesion. Sinuses/Orbits: No acute finding. Small mucosal retention cyst versus polyp in the lateral aspect of the right maxillary sinus incidentally noted. Other: None. IMPRESSION: 1. No acute intracranial abnormalities. The appearance of the brain is normal. Electronically  Signed   By: Trudie Reed M.D.   On: 09/17/2017 15:24   Dg Chest Port 1 View  Result Date: 09/15/2017 CLINICAL DATA:  Shortness of breath.  CHF. EXAM: PORTABLE CHEST 1 VIEW COMPARISON:  None. FINDINGS: The heart size borderline. The hila and mediastinum are normal. No pneumothorax. No nodules or masses. No overt edema. Mild atelectasis in the bases. IMPRESSION: No active disease. Electronically Signed   By: Gerome Sam III M.D   On: 09/15/2017 08:11    Microbiology: Recent Results (from the past 240 hour(s))  Culture, expectorated sputum-assessment     Status: None   Collection Time: 09/14/17  5:49 AM  Result Value Ref Range Status   Specimen Description EXPECTORATED SPUTUM  Final   Special Requests   Final    NONE Performed at [facility], [address], [place], [place] [address]    Sputum evaluation THIS SPECIMEN IS ACCEPTABLE FOR SPUTUM CULTURE  Final   Report Status 09/15/2017 FINAL  Final  Culture, respiratory (NON-Expectorated)     Status: None   Collection Time: 09/14/17  5:49 AM  Result Value Ref Range Status   Specimen Description EXPECTORATED SPUTUM  Final   Special Requests NONE Reflexed from [id number]  Final   Gram Stain   Final    MODERATE WBC  PRESENT, PREDOMINANTLY PMN RARE SQUAMOUS EPITHELIAL CELLS PRESENT RARE GRAM POSITIVE COCCI    Culture   Final    Consistent with normal respiratory flora. Performed at [facility], [address], [place], [place] [address]    Report Status 09/17/2017 FINAL  Final  Respiratory Panel by PCR     Status: Abnormal   Collection Time: 09/14/17  1:33 PM  Result Value Ref Range Status   Adenovirus NOT DETECTED NOT DETECTED Final   Coronavirus 229E NOT DETECTED NOT DETECTED Final   Coronavirus HKU1 NOT DETECTED NOT DETECTED Final   Coronavirus NL63 NOT DETECTED NOT DETECTED Final   Coronavirus OC43 NOT DETECTED NOT DETECTED Final   Metapneumovirus NOT DETECTED NOT DETECTED Final   Rhinovirus / Enterovirus NOT DETECTED NOT DETECTED Final   Influenza A H3 DETECTED (A) NOT DETECTED Final   Influenza B NOT DETECTED NOT DETECTED Final   Parainfluenza Virus 1 NOT DETECTED NOT DETECTED Final   Parainfluenza Virus 2 NOT DETECTED NOT DETECTED Final   Parainfluenza Virus 3 NOT DETECTED NOT DETECTED Final   Parainfluenza Virus 4 NOT DETECTED NOT DETECTED Final   Respiratory Syncytial Virus NOT DETECTED NOT DETECTED Final   Bordetella pertussis NOT DETECTED NOT DETECTED Final   Chlamydophila pneumoniae NOT DETECTED NOT DETECTED Final   Mycoplasma pneumoniae NOT DETECTED NOT DETECTED Final    Comment: Performed at [facility], [address] [place], [place] [address]  MRSA PCR Screening     Status: None   Collection Time: 09/14/17  1:33 PM  Result Value Ref Range Status   MRSA by PCR NEGATIVE NEGATIVE Final    Comment:        The GeneXpert MRSA Assay (FDA approved for NASAL specimens only), is one component of a comprehensive MRSA colonization surveillance program. It is not intended to diagnose MRSA infection nor to guide or monitor treatment for MRSA infections. Performed at [facility], [address], [place], [place] [address]      Labs: Basic Metabolic  Panel: Recent Labs  Lab 09/14/17 1349 09/15/17 0131  09/17/17 0881 09/18/17 0411 09/19/17 1031 09/19/17 5945 09/20/17 0220  NA 138 137   < > 137 138 137 137 138  K 4.0 3.9   < > 4.6 5.0 5.5* 5.5* 4.2  CL 96* 93*   < > 100* 103 105 104 102  CO2 26 28   < > 28 23 22  21* 24  GLUCOSE 146* 128*   < > 152* 177* 182* 200* 134*  BUN 26* 41*   < > 41* 39* 37* 38* 37*  CREATININE 1.45* 1.71*   < > 1.25* 1.20* 1.28* 1.32* 1.24*  CALCIUM 8.7* 8.5*   < > 9.0 8.9 8.8* 9.0 9.1  MG 2.2 2.9*  --   --   --   --   --   --   PHOS  --   --   --   --   --  3.5  --  3.4   < > = values in this interval not displayed.   Liver Function Tests: Recent Labs  Lab 09/14/17 1349 09/19/17 0346 09/20/17 0220  AST 22  --   --   ALT 23  --   --   ALKPHOS 84  --   --   BILITOT 0.8  --   --   PROT 6.7  --   --   ALBUMIN 3.2* 2.9* 3.3*   No results for input(s): LIPASE, AMYLASE in the last 168 hours. No results for input(s): AMMONIA in the last 168 hours. CBC: Recent Labs  Lab 09/14/17 1349 09/16/17 0429 09/17/17 0417 09/18/17 0411 09/19/17 0346  WBC 15.5* 10.0 12.2* 13.6* 11.3*  NEUTROABS 14.2*  --   --   --   --   HGB 13.3 13.4 13.5 13.1 12.9  HCT 41.4 42.1 43.1 41.8 41.6  MCV 89.6 90.3 91.5 90.9 91.4  PLT 253 231 224 212 217   Cardiac Enzymes: Recent Labs  Lab 09/14/17 1349 09/14/17 1936 09/15/17 0131  TROPONINI 0.21* 0.20* 0.18*   BNP: BNP (last 3 results) Recent Labs    09/14/17 1333  BNP 501.7*    ProBNP (last 3 results) No results for input(s): PROBNP in the last 8760 hours.  CBG: Recent Labs  Lab 09/16/17 1621 09/16/17 2046 09/17/17 0923 09/17/17 1132 09/17/17 2207  GLUCAP 162* 231* 138* 195* 251*       Signed:  Rhetta Mura MD   [facility] 09/20/2017, 10:12 AM

## 2017-09-25 ENCOUNTER — Telehealth: Payer: Self-pay | Admitting: Physician Assistant

## 2017-09-25 NOTE — Telephone Encounter (Signed)
New message     Sherri from [facility] calling to confirm if cardiac rehab forms received.   Please call [phone] ext [phone]

## 2017-09-25 NOTE — Telephone Encounter (Signed)
Returned Sherri's call from [employer] re: Cardiac Rehab. When I spoke with her, I noticed that she was given the incorrect fax # to fax the forms over, so I provided the correct # and she will fax the Cardiac Rehab forms over to attn:  Dr. Elease Hashimoto.  Sherri thanked me for my help.

## 2017-10-08 ENCOUNTER — Ambulatory Visit: Payer: BLUE CROSS/BLUE SHIELD | Admitting: Cardiology

## 2017-10-08 ENCOUNTER — Encounter: Payer: Self-pay | Admitting: Cardiology

## 2017-10-08 VITALS — BP 112/60 | HR 100 | Ht 61.0 in | Wt 229.0 lb

## 2017-10-08 DIAGNOSIS — I255 Ischemic cardiomyopathy: Secondary | ICD-10-CM | POA: Insufficient documentation

## 2017-10-08 DIAGNOSIS — E785 Hyperlipidemia, unspecified: Secondary | ICD-10-CM | POA: Diagnosis not present

## 2017-10-08 DIAGNOSIS — I5041 Acute combined systolic (congestive) and diastolic (congestive) heart failure: Secondary | ICD-10-CM

## 2017-10-08 DIAGNOSIS — I2510 Atherosclerotic heart disease of native coronary artery without angina pectoris: Secondary | ICD-10-CM | POA: Insufficient documentation

## 2017-10-08 HISTORY — DX: Hyperlipidemia, unspecified: E78.5

## 2017-10-08 HISTORY — DX: Ischemic cardiomyopathy: I25.5

## 2017-10-08 NOTE — Patient Instructions (Addendum)
Medication Instructions:  Your physician has recommended you make the following change in your medication: STOP amlodipine   Labwork: Your physician recommends that you return for lab work today: Pro-BNP, BMP.   Testing/Procedures: None.  Follow-Up: Your physician recommends that you schedule a follow-up appointment in: 1 month.  Any Other Special Instructions Will Be Listed Below (If Applicable).     If you need a refill on your cardiac medications before your next appointment, please call your pharmacy.

## 2017-10-08 NOTE — Progress Notes (Signed)
Cardiology Office Note:    Date:  10/08/2017   ID:  Katie Rasmussen, DOB October 26, 1970, MRN [id number]  PCP:  Paulina Fusi, MD  Cardiologist:  Gypsy Balsam, MD    Referring MD: Paulina Fusi, MD   Chief Complaint  Patient presents with  . Hospitalization Follow-up  Having recently in the hospital because of heart attack  History of Present Illness:    Katie Rasmussen is a 47 y.o. female with COPD, cardiomyopathy which is ischemic in origin with ejection fraction 35-40%.  Recent cardiac catheterization showing critical lesion in proximal LAD that was addressed with drug-eluting stenting.  Since that time she is doing much better denies have any chest pain tightness squeezing pressure burning chest.  She was told for years that she had asthma however since the time of angioplasty she denies having any shortness of breath tightness squeezing pressure burning chest.  He complained of being dizzy when she gets up very quickly she gets dizzy she decided to stop hydralazine as well as lisinopril.  She is feeling significantly better.  She had a lot of questions regarding what was done to her about medications we did review all medications with the indications for all those medications.  She does have also some kidney dysfunction.  Past Medical History:  Diagnosis Date  . Acute combined systolic and diastolic CHF, NYHA class 3 (HCC)   . Bronchitis due to tobacco use (HCC) 08/2017  . Chronic respiratory failure (HCC)   . COPD (chronic obstructive pulmonary disease) (HCC)   . Coronary artery disease   . Diastolic CHF (HCC)   . Obesity hypoventilation syndrome (HCC)   . Pneumonia 08/2017  . Systolic CHF with reduced left ventricular function, NYHA class 3 (HCC) 09/13/2017    Past Surgical History:  Procedure Laterality Date  . CESAREAN SECTION    . CORONARY STENT INTERVENTION N/A 09/18/2017   Procedure: CORONARY STENT INTERVENTION;  Surgeon: Tonny Bollman, MD;  Location: [facility]  INVASIVE CV LAB;  Service: Cardiovascular;  Laterality: N/A;  prox LAD  . LEFT HEART CATH AND CORONARY ANGIOGRAPHY N/A 09/18/2017   Procedure: LEFT HEART CATH AND CORONARY ANGIOGRAPHY;  Surgeon: Tonny Bollman, MD;  Location: [facility] INVASIVE CV LAB;  Service: Cardiovascular;  Laterality: N/A;  . THYROID SURGERY      Current Medications: Current Meds  Medication Sig  . albuterol (PROVENTIL HFA;VENTOLIN HFA) 108 (90 Base) MCG/ACT inhaler Inhale 2 puffs into the lungs every 4 (four) hours as needed for wheezing or shortness of breath.  . ALPRAZolam (XANAX) 0.25 MG tablet Take 1 tablet by mouth 3 (three) times daily as needed.  Marland Kitchen amLODipine (NORVASC) 10 MG tablet Take 1 tablet (10 mg total) by mouth daily.  Marland Kitchen aspirin EC 81 MG EC tablet Take 1 tablet (81 mg total) by mouth daily.  Marland Kitchen atorvastatin (LIPITOR) 80 MG tablet Take 1 tablet (80 mg total) by mouth daily at 6 PM.  . clopidogrel (PLAVIX) 75 MG tablet Take 1 tablet (75 mg total) by mouth daily with breakfast.  . fluticasone furoate-vilanterol (BREO ELLIPTA) 100-25 MCG/INH AEPB Inhale 1 puff into the lungs daily.  . furosemide (LASIX) 40 MG tablet Take 40 mg by mouth daily as needed for edema.  Marland Kitchen ipratropium-albuterol (DUONEB) 0.5-2.5 (3) MG/3ML SOLN Take 3 mLs by nebulization every 6 (six) hours as needed (short of breath).   Marland Kitchen levothyroxine (SYNTHROID, LEVOTHROID) 175 MCG tablet Take 175 mcg by mouth daily before breakfast.  . lisinopril (PRINIVIL,ZESTRIL) 20 MG tablet Take 10  mg by mouth at bedtime.   . metoprolol succinate (TOPROL-XL) 50 MG 24 hr tablet Take 1 tablet (50 mg total) by mouth daily. Take with or immediately following a meal.     Allergies:   Iodine; Lovenox [enoxaparin]; and Shellfish allergy   Social History   Socioeconomic History  . Marital status: Married    Spouse name: Not on file  . Number of children: 3  . Years of education: Not on file  . Highest education level: Not on file  Occupational History  . 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 Financial resource strain: Not on file  . Food insecurity:    Worry: Not on file    Inability: Not on file  . Transportation needs:    Medical: Not on file    Non-medical: Not on file  Tobacco Use  . Smoking status: Former Smoker    Packs/day: 0.50    Years: 14.00    Pack years: 7.00    Types: Cigarettes    Last attempt to quit: 09/12/2017    Years since quitting: 0.0  . Smokeless tobacco: Never Used  Substance and Sexual Activity  . Alcohol use: No    Frequency: Never  . Drug use: No  . Sexual activity: Not on file  Lifestyle  . Physical activity:    Days per week: Not on file    Minutes per session: Not on file  . Stress: Very much  Relationships  . Social connections:    Talks on phone: Not on file    Gets together: Not on file    Attends religious service: Not on file    Active member of club or organization: Not on file    Attends meetings of clubs or organizations: Not on file    Relationship status: Not on file  Other Topics Concern  . Not on file  Social History Narrative  . Not on file     Family History: The patient's family history includes Diabetes in her father; Heart disease in her father and mother; Hypertension in her mother. ROS:   Please see the history of present illness.    All 14 point review of systems negative except as described per history of present illness  EKGs/Labs/Other Studies Reviewed:      Recent Labs: 09/14/2017: ALT 23; B Natriuretic Peptide 501.7; TSH 1.278 09/15/2017: Magnesium 2.9 09/19/2017: Hemoglobin 12.9; Platelets 217 09/20/2017: BUN 37; Creatinine, Ser 1.24; Potassium 4.2; Sodium 138  Recent Lipid Panel No results found for: CHOL, TRIG, HDL, CHOLHDL, VLDL, LDLCALC, LDLDIRECT  Physical Exam:    VS:  BP 112/60   Pulse 100   Ht 5\' 1"  (1.549 m)   Wt 229 lb (103.9 kg)   LMP 09/15/2017   SpO2 95%   BMI 43.27 kg/m     Wt Readings from Last 3 Encounters:    10/08/17 229 lb (103.9 kg)  09/20/17 231 lb 7.7 oz (105 kg)     GEN:  Well nourished, well developed in no acute distress HEENT: Normal NECK: No JVD; No carotid bruits LYMPHATICS: No lymphadenopathy CARDIAC: RRR, no murmurs, no rubs, no gallops RESPIRATORY:  Clear to auscultation without rales, wheezing or rhonchi  ABDOMEN: Soft, non-tender, non-distended MUSCULOSKELETAL:  No edema; No deformity  SKIN: Warm and dry LOWER EXTREMITIES: no swelling NEUROLOGIC:  Alert and oriented x 3 PSYCHIATRIC:  Normal affect   ASSESSMENT:    1. Acute combined systolic and diastolic CHF, NYHA  class 3 (HCC)   2. Ischemic cardiomyopathy   3. Coronary artery disease involving native coronary artery of native heart without angina pectoris   4. Dyslipidemia    PLAN:    In order of problems listed above:  1. Congestive heart failure: Appears to be compensated right now.  She check her weight on the regular basis she knows about low-salt diet.  She continue takes her medication.  We will check Chem-7 and proBNP today. 2. Ischemic cardiomyopathy ejection fraction 35-40% but stent to LAD has been placed anticipate her ejection fraction to improve.  She still take beta-blocker which I will continue.  She also was taking lisinopril however discontinued that medication because of dizziness.  We will check Chem-7 based on that we will decide what to take.  Dissipate her to take 10 mg of lisinopril. 3. Essential hypertension: Blood pressure actually low I asked her to stop amlodipine.  She already stopped hydralazine.  We will continue with metoprolol as well as lisinopril at the dose of which will decide after Chem-7. 4. Dyslipidemia: She is on high intensity statin which I will continue.  She quit smoking and I congratulated him for that she was very well educated about low salt low fluid diet.  And she is taking to it.  I see her back in my office in about 1 month today we will do Chem-7 as well as  proBNP.   Medication Adjustments/Labs and Tests Ordered: Current medicines are reviewed at length with the patient today.  Concerns regarding medicines are outlined above.  No orders of the defined types were placed in this encounter.  Medication changes: No orders of the defined types were placed in this encounter.   Signed, Georgeanna Lea, MD, [facility] 10/08/2017 9:34 AM    [facility]

## 2017-10-09 LAB — PRO B NATRIURETIC PEPTIDE: NT-Pro BNP: 626 pg/mL — ABNORMAL HIGH (ref 0–249)

## 2017-10-09 LAB — BASIC METABOLIC PANEL
BUN/Creatinine Ratio: 9 (ref 9–23)
BUN: 11 mg/dL (ref 6–24)
CALCIUM: 9.2 mg/dL (ref 8.7–10.2)
CO2: 23 mmol/L (ref 20–29)
Chloride: 100 mmol/L (ref 96–106)
Creatinine, Ser: 1.17 mg/dL — ABNORMAL HIGH (ref 0.57–1.00)
GFR calc Af Amer: 64 mL/min/{1.73_m2} (ref >59)
GFR, EST NON AFRICAN AMERICAN: 56 mL/min/{1.73_m2} — AB (ref >59)
GLUCOSE: 133 mg/dL — AB (ref 65–99)
POTASSIUM: 4.1 mmol/L (ref 3.5–5.2)
Sodium: 141 mmol/L (ref 134–144)

## 2017-10-10 ENCOUNTER — Telehealth: Payer: Self-pay | Admitting: *Deleted

## 2017-10-10 DIAGNOSIS — I5041 Acute combined systolic (congestive) and diastolic (congestive) heart failure: Secondary | ICD-10-CM

## 2017-10-10 MED ORDER — LISINOPRIL 20 MG PO TABS: 20.0000 mg | ORAL_TABLET | Freq: Every day | ORAL | 2 refills | Status: DC

## 2017-10-10 NOTE — Telephone Encounter (Signed)
Patient informed of results. Patient advised to increase lisinopril from 10 to 20 mg daily and to go to [facility] in 1 week (10/17/2017) for repeat blood work. Patient verbalized understanding. No further questions.

## 2017-10-10 NOTE — Telephone Encounter (Signed)
-----   Message from Georgeanna Lea, MD sent at 10/09/2017  2:58 PM EDT ----- Labs are good, double the dose of lisinopril, chem7 in 1 week

## 2017-10-11 ENCOUNTER — Ambulatory Visit: Payer: BLUE CROSS/BLUE SHIELD | Admitting: Physician Assistant

## 2017-10-15 ENCOUNTER — Telehealth: Payer: Self-pay | Admitting: Cardiology

## 2017-10-15 NOTE — Telephone Encounter (Signed)
Reminded patient of repeat lab work that needs to be drawn on Wednesday, 10/17/17. Patient verbalized understanding and explained that her blood pressure is still running low: 97/55, 96/50. Patient reports taking furosemide daily instead of as needed. Patient states she doesn't understand what "as needed" means. Educated patient to take her metoprolol and lisinopril once daily and then to weight herself at the same time every day. If her weight increased 2-3 pounds in one day, then take furosemide, if not then do not take it. Advised her that this change should allow her blood pressure to stabilize and make her feel better. Advised her to keep track of her blood pressures and daily weight and bring to her follow up appointment on 11/07/17. Patient states she has some mild swelling in her legs in the afternoons and evening, advised her to elevate her legs throughout the day. Advised her to call the office if she has any questions or doesn't see any improvement in her blood pressures. Patient verbalized understanding. No further questions.

## 2017-10-15 NOTE — Telephone Encounter (Signed)
Patient is calling to check to see if she can go to chiropractor and have Decompression?  She has had a MI   in the past and the Chiropractor needs the "ok".Marland Kitchen

## 2017-10-15 NOTE — Telephone Encounter (Signed)
Dr. Bing Matter advised that as long as patient continues current medications, especially plavix, it is fine for her to have the decompression at the Chiropractor's office. Patient verbalized understanding. No further questions.

## 2017-10-18 LAB — BASIC METABOLIC PANEL
BUN / CREAT RATIO: 7 — AB (ref 9–23)
BUN: 9 mg/dL (ref 6–24)
CO2: 23 mmol/L (ref 20–29)
CREATININE: 1.31 mg/dL — AB (ref 0.57–1.00)
Calcium: 9.4 mg/dL (ref 8.7–10.2)
Chloride: 101 mmol/L (ref 96–106)
GFR calc Af Amer: 56 mL/min/{1.73_m2} — ABNORMAL LOW (ref >59)
GFR, EST NON AFRICAN AMERICAN: 49 mL/min/{1.73_m2} — AB (ref >59)
GLUCOSE: 84 mg/dL (ref 65–99)
POTASSIUM: 4.5 mmol/L (ref 3.5–5.2)
SODIUM: 142 mmol/L (ref 134–144)

## 2017-10-24 ENCOUNTER — Other Ambulatory Visit: Payer: Self-pay | Admitting: *Deleted

## 2017-10-24 MED ORDER — CLOPIDOGREL BISULFATE 75 MG PO TABS: 75.0000 mg | ORAL_TABLET | Freq: Every day | ORAL | 3 refills | Status: DC

## 2017-10-24 MED ORDER — ATORVASTATIN CALCIUM 80 MG PO TABS: 80.0000 mg | ORAL_TABLET | Freq: Every day | ORAL | 3 refills | Status: DC

## 2017-10-24 NOTE — Telephone Encounter (Signed)
Patient needed refills for lipitor and plavix. Last office visit was on 10/08/2017. Patient requested a 90 day supply to [facility]. Refills sent.

## 2017-11-07 ENCOUNTER — Ambulatory Visit: Payer: BLUE CROSS/BLUE SHIELD | Admitting: Cardiology

## 2017-11-07 ENCOUNTER — Encounter: Payer: Self-pay | Admitting: Cardiology

## 2017-11-07 VITALS — BP 144/76 | HR 71 | Ht 61.0 in | Wt 222.8 lb

## 2017-11-07 DIAGNOSIS — E785 Hyperlipidemia, unspecified: Secondary | ICD-10-CM | POA: Diagnosis not present

## 2017-11-07 DIAGNOSIS — I255 Ischemic cardiomyopathy: Secondary | ICD-10-CM | POA: Diagnosis not present

## 2017-11-07 DIAGNOSIS — I2510 Atherosclerotic heart disease of native coronary artery without angina pectoris: Secondary | ICD-10-CM

## 2017-11-07 DIAGNOSIS — J410 Simple chronic bronchitis: Secondary | ICD-10-CM

## 2017-11-07 NOTE — Progress Notes (Signed)
Cardiology Office Note:    Date:  11/07/2017   ID:  Rogenia Werntz, DOB 1971-05-15, MRN [id number]  PCP:  Paulina Fusi, MD  Cardiologist:  Gypsy Balsam, MD    Referring MD: Paulina Fusi, MD   Chief Complaint  Patient presents with  . Follow-up    1 month follow up   Doing better  History of Present Illness:    Katie Rasmussen is a 47 y.o. female with coronary artery disease status post PTCA and stenting done few months ago overall cardiac wise doing well denies have any chest pain tightness squeezing pressure burning chest she did have some difficulty breathing lately she has been treated aggressively by her pulmonologist with steroids as well as antibiotic and getting significantly better initially she got Kenalog she did not like it because it made her restless but now she is on prednisone tolerating this well apparently she got 2 more days of prednisone.  Past Medical History:  Diagnosis Date  . Acute combined systolic and diastolic CHF, NYHA class 3 (HCC)   . Bronchitis due to tobacco use (HCC) 08/2017  . Chronic respiratory failure (HCC)   . COPD (chronic obstructive pulmonary disease) (HCC)   . Coronary artery disease   . Diastolic CHF (HCC)   . Obesity hypoventilation syndrome (HCC)   . Pneumonia 08/2017  . Systolic CHF with reduced left ventricular function, NYHA class 3 (HCC) 09/13/2017    Past Surgical History:  Procedure Laterality Date  . CESAREAN SECTION    . CORONARY STENT INTERVENTION N/A 09/18/2017   Procedure: CORONARY STENT INTERVENTION;  Surgeon: Tonny Bollman, MD;  Location: [facility] INVASIVE CV LAB;  Service: Cardiovascular;  Laterality: N/A;  prox LAD  . LEFT HEART CATH AND CORONARY ANGIOGRAPHY N/A 09/18/2017   Procedure: LEFT HEART CATH AND CORONARY ANGIOGRAPHY;  Surgeon: Tonny Bollman, MD;  Location: [facility] INVASIVE CV LAB;  Service: Cardiovascular;  Laterality: N/A;  . THYROID SURGERY      Current Medications: Current Meds  Medication  Sig  . albuterol (PROVENTIL HFA;VENTOLIN HFA) 108 (90 Base) MCG/ACT inhaler Inhale 2 puffs into the lungs every 4 (four) hours as needed for wheezing or shortness of breath.  . ALPRAZolam (XANAX) 0.25 MG tablet Take 1 tablet by mouth 3 (three) times daily as needed.  Marland Kitchen aspirin EC 81 MG EC tablet Take 1 tablet (81 mg total) by mouth daily.  Marland Kitchen atorvastatin (LIPITOR) 80 MG tablet Take 1 tablet (80 mg total) by mouth daily at 6 PM.  . clopidogrel (PLAVIX) 75 MG tablet Take 1 tablet (75 mg total) by mouth daily with breakfast.  . fluticasone furoate-vilanterol (BREO ELLIPTA) 100-25 MCG/INH AEPB Inhale 1 puff into the lungs daily.  . furosemide (LASIX) 40 MG tablet Take 40 mg by mouth daily as needed for edema.  Marland Kitchen ipratropium-albuterol (DUONEB) 0.5-2.5 (3) MG/3ML SOLN Take 3 mLs by nebulization every 6 (six) hours as needed (short of breath).   Marland Kitchen levofloxacin (LEVAQUIN) 500 MG tablet Take 500 mg by mouth daily.  Marland Kitchen levothyroxine (SYNTHROID, LEVOTHROID) 175 MCG tablet Take 175 mcg by mouth daily before breakfast.  . lisinopril (PRINIVIL,ZESTRIL) 20 MG tablet Take 1 tablet (20 mg total) by mouth at bedtime. (Patient taking differently: Take 10 mg by mouth at bedtime. )  . metoprolol succinate (TOPROL-XL) 50 MG 24 hr tablet Take 1 tablet (50 mg total) by mouth daily. Take with or immediately following a meal.  . predniSONE (DELTASONE) 20 MG tablet Take 1 tablet by mouth 2 (two)  times daily.     Allergies:   Iodine; Lovenox [enoxaparin]; and Shellfish allergy   Social History   Socioeconomic History  . Marital status: Married    Spouse name: Not on file  . Number of children: 3  . Years of education: Not on file  . Highest education level: Not on file  Occupational History  . 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 Financial resource strain: Not on file  . Food insecurity:    Worry: Not on file    Inability: Not on file  . Transportation needs:    Medical: Not on  file    Non-medical: Not on file  Tobacco Use  . Smoking status: Former Smoker    Packs/day: 0.50    Years: 14.00    Pack years: 7.00    Types: Cigarettes    Last attempt to quit: 09/12/2017    Years since quitting: 0.1  . Smokeless tobacco: Never Used  Substance and Sexual Activity  . Alcohol use: No    Frequency: Never  . Drug use: No  . Sexual activity: Not on file  Lifestyle  . Physical activity:    Days per week: Not on file    Minutes per session: Not on file  . Stress: Very much  Relationships  . Social connections:    Talks on phone: Not on file    Gets together: Not on file    Attends religious service: Not on file    Active member of club or organization: Not on file    Attends meetings of clubs or organizations: Not on file    Relationship status: Not on file  Other Topics Concern  . Not on file  Social History Narrative  . Not on file     Family History: The patient's family history includes Diabetes in her father; Heart disease in her father and mother; Hypertension in her mother. ROS:   Please see the history of present illness.    All 14 point review of systems negative except as described per history of present illness  EKGs/Labs/Other Studies Reviewed:      Recent Labs: 09/14/2017: ALT 23; B Natriuretic Peptide 501.7; TSH 1.278 09/15/2017: Magnesium 2.9 09/19/2017: Hemoglobin 12.9; Platelets 217 10/08/2017: NT-Pro BNP 626 10/17/2017: BUN 9; Creatinine, Ser 1.31; Potassium 4.5; Sodium 142  Recent Lipid Panel No results found for: CHOL, TRIG, HDL, CHOLHDL, VLDL, LDLCALC, LDLDIRECT  Physical Exam:    VS:  BP (!) 144/76 (BP Location: Right Arm, Patient Position: Sitting, Cuff Size: Large)   Pulse 71   Ht 5\' 1"  (1.549 m)   Wt 222 lb 12.8 oz (101.1 kg)   SpO2 98%   BMI 42.10 kg/m     Wt Readings from Last 3 Encounters:  11/07/17 222 lb 12.8 oz (101.1 kg)  10/08/17 229 lb (103.9 kg)  09/20/17 231 lb 7.7 oz (105 kg)     GEN:  Well nourished, well  developed in no acute distress HEENT: Normal NECK: No JVD; No carotid bruits LYMPHATICS: No lymphadenopathy CARDIAC: RRR, no murmurs, no rubs, no gallops RESPIRATORY:  Clear to auscultation without rales, wheezing or rhonchi  ABDOMEN: Soft, non-tender, non-distended MUSCULOSKELETAL:  No edema; No deformity  SKIN: Warm and dry LOWER EXTREMITIES: no swelling NEUROLOGIC:  Alert and oriented x 3 PSYCHIATRIC:  Normal affect   ASSESSMENT:    1. Ischemic cardiomyopathy   2. Coronary artery disease involving native coronary artery of native heart without angina  pectoris   3. Dyslipidemia   4. Simple chronic bronchitis (HCC)    PLAN:    In order of problems listed above:  1. Ischemic cardiomyopathy.  She is on beta-blocker as well as ACE inhibitor will check Chem-7 today if Chem-7 is acceptable we will increase dose of lisinopril.  Previously we had a problem with low blood pressure today blood pressure is elevated and she reports to have blood pressure elevated she thinks it is related to prednisone which is probably correct. 2. Dyslipidemia she is on high intensity statin which I will continue. 3. Coronary artery disease: Denies have any chest pain tightness squeezing pressure burning chest.  She is on dual antiplatelet agents which I will continue.  I see her back in my office in about 2 months at that time we will repeat echocardiogram to check if there is any improvement in her left ventricular ejection fraction while being on beta-blocker and ACE inhibitor.   Medication Adjustments/Labs and Tests Ordered: Current medicines are reviewed at length with the patient today.  Concerns regarding medicines are outlined above.  No orders of the defined types were placed in this encounter.  Medication changes: No orders of the defined types were placed in this encounter.   Signed, Georgeanna Lea, MD, [facility] 11/07/2017 10:17 AM    [facility]

## 2017-11-07 NOTE — Patient Instructions (Signed)
Medication Instructions:  Your physician recommends that you continue on your current medications as directed. Please refer to the Current Medication list given to you today.   Labwork: Your physician recommends that you return for lab work today: BMP.   Testing/Procedures: None  Follow-Up: Your physician recommends that you schedule a follow-up appointment in: 2 months.  Any Other Special Instructions Will Be Listed Below (If Applicable).     If you need a refill on your cardiac medications before your next appointment, please call your pharmacy.

## 2017-11-08 LAB — BASIC METABOLIC PANEL
BUN / CREAT RATIO: 16 (ref 9–23)
BUN: 19 mg/dL (ref 6–24)
CHLORIDE: 106 mmol/L (ref 96–106)
CO2: 21 mmol/L (ref 20–29)
Calcium: 9.2 mg/dL (ref 8.7–10.2)
Creatinine, Ser: 1.22 mg/dL — ABNORMAL HIGH (ref 0.57–1.00)
GFR calc Af Amer: 61 mL/min/{1.73_m2} (ref >59)
GFR calc non Af Amer: 53 mL/min/{1.73_m2} — ABNORMAL LOW (ref >59)
GLUCOSE: 85 mg/dL (ref 65–99)
POTASSIUM: 3.9 mmol/L (ref 3.5–5.2)
Sodium: 142 mmol/L (ref 134–144)

## 2017-11-09 ENCOUNTER — Encounter: Payer: Self-pay | Admitting: Cardiology

## 2017-11-12 ENCOUNTER — Telehealth: Payer: Self-pay | Admitting: *Deleted

## 2017-11-12 ENCOUNTER — Encounter (HOSPITAL_COMMUNITY): Payer: Self-pay | Admitting: Cardiovascular Disease

## 2017-11-12 MED ORDER — LISINOPRIL 20 MG PO TABS: 10.0000 mg | ORAL_TABLET | Freq: Every day | ORAL | Status: DC

## 2017-11-12 NOTE — Addendum Note (Signed)
Addended by: Crist Fat on: 11/12/2017 01:59 PM   Modules accepted: Orders

## 2017-11-12 NOTE — Telephone Encounter (Signed)
Patient informed of lab results. Patient asking if she needs to increase lisinopril dose. Will have Dr. Bing Matter advise.

## 2017-11-12 NOTE — Telephone Encounter (Signed)
Patient was on lisinopril 20 mg daily and states she felt " lightheaded and dizzy." We decreased her lisinopril to 10 mg daily and patient states she feels good and her blood pressure this morning was 120s/70s. Dr. Bing Matter advised to keep lisinopril dosage the same. Patient verbalized understanding. No further questions.

## 2017-11-20 ENCOUNTER — Other Ambulatory Visit: Payer: Self-pay

## 2017-11-20 MED ORDER — METOPROLOL SUCCINATE ER 50 MG PO TB24: 50.0000 mg | ORAL_TABLET | Freq: Every day | ORAL | 6 refills | Status: DC

## 2017-12-18 ENCOUNTER — Telehealth: Payer: Self-pay | Admitting: Cardiovascular Disease

## 2017-12-18 NOTE — Telephone Encounter (Signed)
Referral faxed

## 2017-12-18 NOTE — Telephone Encounter (Signed)
Routing to patient's cardiologist, Dr. Bing Matter and Mady Gemma, RN

## 2017-12-18 NOTE — Telephone Encounter (Signed)
New message   Cardiac rehab is calling they have not received the referral back for the patient to be in their rehab program, they are needing a call back , it was faxed back in March and they have not gotten a response back

## 2018-01-01 ENCOUNTER — Encounter: Payer: Self-pay | Admitting: Cardiology

## 2018-01-01 ENCOUNTER — Ambulatory Visit: Payer: BLUE CROSS/BLUE SHIELD | Admitting: Cardiology

## 2018-01-01 VITALS — BP 120/80 | HR 88 | Ht 61.0 in | Wt 218.8 lb

## 2018-01-01 DIAGNOSIS — I255 Ischemic cardiomyopathy: Secondary | ICD-10-CM | POA: Diagnosis not present

## 2018-01-01 DIAGNOSIS — I2510 Atherosclerotic heart disease of native coronary artery without angina pectoris: Secondary | ICD-10-CM | POA: Diagnosis not present

## 2018-01-01 DIAGNOSIS — J410 Simple chronic bronchitis: Secondary | ICD-10-CM

## 2018-01-01 DIAGNOSIS — E785 Hyperlipidemia, unspecified: Secondary | ICD-10-CM

## 2018-01-01 NOTE — Progress Notes (Signed)
Cardiology Office Note:    Date:  01/01/2018   ID:  Katie Rasmussen, DOB Jul 09, 1971, MRN [id number]  PCP:  Paulina Fusi, MD  Cardiologist:  Gypsy Balsam, MD    Referring MD: Paulina Fusi, MD   Chief Complaint  Patient presents with  . 2 month follow up  Doing great  History of Present Illness:    Katie Rasmussen is a 47 y.o. female with coronary artery disease status post PTCA and stenting to proximal LAD few months ago.  Now asymptomatic except shortness of breath that she always has.  No tightness squeezing pressure burning chest.  She says she feels best ever.  She quit smoking she abstain from smoking I congratulated her again.  We talked about dieting she gained some weight and she is disappointed about that.  She is ready to be L be more active which I told her it is fine to do.  Past Medical History:  Diagnosis Date  . Acute combined systolic and diastolic CHF, NYHA class 3 (HCC)   . Bronchitis due to tobacco use (HCC) 08/2017  . Chronic respiratory failure (HCC)   . COPD (chronic obstructive pulmonary disease) (HCC)   . Coronary artery disease   . Diastolic CHF (HCC)   . Obesity hypoventilation syndrome (HCC)   . Pneumonia 08/2017  . Systolic CHF with reduced left ventricular function, NYHA class 3 (HCC) 09/13/2017    Past Surgical History:  Procedure Laterality Date  . CESAREAN SECTION    . CORONARY STENT INTERVENTION N/A 09/18/2017   Procedure: CORONARY STENT INTERVENTION;  Surgeon: Tonny Bollman, MD;  Location: [facility] INVASIVE CV LAB;  Service: Cardiovascular;  Laterality: N/A;  prox LAD  . LEFT HEART CATH AND CORONARY ANGIOGRAPHY N/A 09/18/2017   Procedure: LEFT HEART CATH AND CORONARY ANGIOGRAPHY;  Surgeon: Tonny Bollman, MD;  Location: [facility] INVASIVE CV LAB;  Service: Cardiovascular;  Laterality: N/A;  . THYROID SURGERY      Current Medications: Current Meds  Medication Sig  . albuterol (PROVENTIL HFA;VENTOLIN HFA) 108 (90 Base) MCG/ACT inhaler  Inhale 2 puffs into the lungs every 4 (four) hours as needed for wheezing or shortness of breath.  . ALPRAZolam (XANAX) 0.25 MG tablet Take 1 tablet by mouth 3 (three) times daily as needed.  Marland Kitchen aspirin EC 81 MG EC tablet Take 1 tablet (81 mg total) by mouth daily.  Marland Kitchen atorvastatin (LIPITOR) 80 MG tablet Take 1 tablet (80 mg total) by mouth daily at 6 PM.  . clopidogrel (PLAVIX) 75 MG tablet Take 1 tablet (75 mg total) by mouth daily with breakfast.  . fluticasone furoate-vilanterol (BREO ELLIPTA) 100-25 MCG/INH AEPB Inhale 1 puff into the lungs daily.  . furosemide (LASIX) 40 MG tablet Take 40 mg by mouth daily as needed for edema.  Marland Kitchen ipratropium-albuterol (DUONEB) 0.5-2.5 (3) MG/3ML SOLN Take 3 mLs by nebulization every 6 (six) hours as needed (short of breath).   Marland Kitchen levothyroxine (SYNTHROID, LEVOTHROID) 175 MCG tablet Take 175 mcg by mouth daily before breakfast.  . lisinopril (PRINIVIL,ZESTRIL) 20 MG tablet Take 0.5 tablets (10 mg total) by mouth at bedtime.  . metoprolol succinate (TOPROL-XL) 50 MG 24 hr tablet Take 1 tablet (50 mg total) by mouth daily. Take with or immediately following a meal.  . predniSONE (DELTASONE) 20 MG tablet Take 1 tablet by mouth 2 (two) times daily.     Allergies:   Iodine; Lovenox [enoxaparin]; and Shellfish allergy   Social History   Socioeconomic History  . Marital status:  Married    Spouse name: Not on file  . Number of children: 3  . Years of education: Not on file  . Highest education level: Not on file  Occupational History  . 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 Financial resource strain: Not on file  . Food insecurity:    Worry: Not on file    Inability: Not on file  . Transportation needs:    Medical: Not on file    Non-medical: Not on file  Tobacco Use  . Smoking status: Former Smoker    Packs/day: 0.50    Years: 14.00    Pack years: 7.00    Types: Cigarettes    Last attempt to quit: 09/12/2017    Years  since quitting: 0.3  . Smokeless tobacco: Never Used  Substance and Sexual Activity  . Alcohol use: No    Frequency: Never  . Drug use: No  . Sexual activity: Not on file  Lifestyle  . Physical activity:    Days per week: Not on file    Minutes per session: Not on file  . Stress: Very much  Relationships  . Social connections:    Talks on phone: Not on file    Gets together: Not on file    Attends religious service: Not on file    Active member of club or organization: Not on file    Attends meetings of clubs or organizations: Not on file    Relationship status: Not on file  Other Topics Concern  . Not on file  Social History Narrative  . Not on file     Family History: The patient's family history includes Diabetes in her father; Heart disease in her father and mother; Hypertension in her mother. ROS:   Please see the history of present illness.    All 14 point review of systems negative except as described per history of present illness  EKGs/Labs/Other Studies Reviewed:      Recent Labs: 09/14/2017: ALT 23; B Natriuretic Peptide 501.7; TSH 1.278 09/15/2017: Magnesium 2.9 09/19/2017: Hemoglobin 12.9; Platelets 217 10/08/2017: NT-Pro BNP 626 11/07/2017: BUN 19; Creatinine, Ser 1.22; Potassium 3.9; Sodium 142  Recent Lipid Panel No results found for: CHOL, TRIG, HDL, CHOLHDL, VLDL, LDLCALC, LDLDIRECT  Physical Exam:    VS:  BP 120/80   Pulse 88   Ht 5\' 1"  (1.549 m)   Wt 218 lb 12.8 oz (99.2 kg)   SpO2 96%   BMI 41.34 kg/m     Wt Readings from Last 3 Encounters:  01/01/18 218 lb 12.8 oz (99.2 kg)  11/07/17 222 lb 12.8 oz (101.1 kg)  10/08/17 229 lb (103.9 kg)     GEN:  Well nourished, well developed in no acute distress HEENT: Normal NECK: No JVD; No carotid bruits LYMPHATICS: No lymphadenopathy CARDIAC: RRR, no murmurs, no rubs, no gallops RESPIRATORY:  Clear to auscultation without rales, wheezing or rhonchi  ABDOMEN: Soft, non-tender,  non-distended MUSCULOSKELETAL:  No edema; No deformity  SKIN: Warm and dry LOWER EXTREMITIES: no swelling NEUROLOGIC:  Alert and oriented x 3 PSYCHIATRIC:  Normal affect   ASSESSMENT:    1. Coronary artery disease involving native coronary artery of native heart without angina pectoris   2. Ischemic cardiomyopathy   3. Simple chronic bronchitis (HCC)   4. Dyslipidemia    PLAN:    In order of problems listed above:  1. Coronary artery disease doing well on appropriate medications which I will  continue 2. Ischemic cardia myopathy on aspirin Plavix beta-blocker as well as ACE inhibitor which I will continue 3. When I see her which will be in 4 months we will repeat her echocardiogram to recheck left ventricular ejection fraction 4. Bronchitis related to COPD she quit smoking which is great news. 5. Dyslipidemia next time I see her we will recheck her fasting lipid profile.  Overall she is doing amazingly well continue present management see her back in 4 months   Medication Adjustments/Labs and Tests Ordered: Current medicines are reviewed at length with the patient today.  Concerns regarding medicines are outlined above.  No orders of the defined types were placed in this encounter.  Medication changes: No orders of the defined types were placed in this encounter.   Signed, Georgeanna Lea, MD, [facility] 01/01/2018 3:21 PM    [facility]

## 2018-01-01 NOTE — Patient Instructions (Signed)
Medication Instructions:  Your physician recommends that you continue on your current medications as directed. Please refer to the Current Medication list given to you today.  Labwork: None ordered  Testing/Procedures: None ordered  Follow-Up: Your physician recommends that you schedule a follow-up appointment in: 4 months in [place] with Dr. Bing Matter   Any Other Special Instructions Will Be Listed Below (If Applicable).     If you need a refill on your cardiac medications before your next appointment, please call your pharmacy.

## 2018-04-05 DIAGNOSIS — G4734 Idiopathic sleep related nonobstructive alveolar hypoventilation: Secondary | ICD-10-CM

## 2018-04-05 DIAGNOSIS — J189 Pneumonia, unspecified organism: Secondary | ICD-10-CM

## 2018-04-05 DIAGNOSIS — G4733 Obstructive sleep apnea (adult) (pediatric): Secondary | ICD-10-CM

## 2018-04-05 DIAGNOSIS — J440 Chronic obstructive pulmonary disease with acute lower respiratory infection: Secondary | ICD-10-CM

## 2018-04-05 DIAGNOSIS — I10 Essential (primary) hypertension: Secondary | ICD-10-CM

## 2018-04-05 DIAGNOSIS — J441 Chronic obstructive pulmonary disease with (acute) exacerbation: Secondary | ICD-10-CM

## 2018-04-05 DIAGNOSIS — I2510 Atherosclerotic heart disease of native coronary artery without angina pectoris: Secondary | ICD-10-CM

## 2018-04-05 DIAGNOSIS — I255 Ischemic cardiomyopathy: Secondary | ICD-10-CM

## 2018-04-05 DIAGNOSIS — J9601 Acute respiratory failure with hypoxia: Secondary | ICD-10-CM | POA: Diagnosis not present

## 2018-04-06 DIAGNOSIS — I255 Ischemic cardiomyopathy: Secondary | ICD-10-CM | POA: Diagnosis not present

## 2018-04-06 DIAGNOSIS — J9601 Acute respiratory failure with hypoxia: Secondary | ICD-10-CM | POA: Diagnosis not present

## 2018-04-06 DIAGNOSIS — I2510 Atherosclerotic heart disease of native coronary artery without angina pectoris: Secondary | ICD-10-CM | POA: Diagnosis not present

## 2018-04-07 DIAGNOSIS — J9601 Acute respiratory failure with hypoxia: Secondary | ICD-10-CM | POA: Diagnosis not present

## 2018-04-07 DIAGNOSIS — I255 Ischemic cardiomyopathy: Secondary | ICD-10-CM | POA: Diagnosis not present

## 2018-04-07 DIAGNOSIS — I2510 Atherosclerotic heart disease of native coronary artery without angina pectoris: Secondary | ICD-10-CM | POA: Diagnosis not present

## 2018-04-08 DIAGNOSIS — J441 Chronic obstructive pulmonary disease with (acute) exacerbation: Secondary | ICD-10-CM | POA: Diagnosis not present

## 2018-04-08 DIAGNOSIS — J9601 Acute respiratory failure with hypoxia: Secondary | ICD-10-CM | POA: Diagnosis not present

## 2018-05-07 ENCOUNTER — Ambulatory Visit (INDEPENDENT_AMBULATORY_CARE_PROVIDER_SITE_OTHER): Payer: BLUE CROSS/BLUE SHIELD | Admitting: Cardiology

## 2018-05-07 ENCOUNTER — Encounter: Payer: Self-pay | Admitting: Cardiology

## 2018-05-07 VITALS — BP 140/70 | HR 101 | Ht 61.0 in | Wt 237.6 lb

## 2018-05-07 DIAGNOSIS — I2510 Atherosclerotic heart disease of native coronary artery without angina pectoris: Secondary | ICD-10-CM | POA: Diagnosis not present

## 2018-05-07 DIAGNOSIS — E785 Hyperlipidemia, unspecified: Secondary | ICD-10-CM

## 2018-05-07 DIAGNOSIS — J410 Simple chronic bronchitis: Secondary | ICD-10-CM

## 2018-05-07 DIAGNOSIS — R0600 Dyspnea, unspecified: Secondary | ICD-10-CM

## 2018-05-07 DIAGNOSIS — R0609 Other forms of dyspnea: Secondary | ICD-10-CM

## 2018-05-07 HISTORY — DX: Other forms of dyspnea: R06.09

## 2018-05-07 HISTORY — DX: Dyspnea, unspecified: R06.00

## 2018-05-07 NOTE — Progress Notes (Signed)
Cardiology Office Note:    Date:  05/07/2018   ID:  Katie Rasmussen, DOB [birth year], MRN [id number]  PCP:  Paulina Fusi, MD  Cardiologist:  Gypsy Balsam, MD    Referring MD: Paulina Fusi, MD   Chief Complaint  Patient presents with  . Follow-up  Not doing well being in the hospital  History of Present Illness:    Katie Rasmussen is a 47 y.o. female with coronary artery disease, status post coronary artery stenting done in March 2019 with drug-eluting stent to proximal LAD.  Also history of cardiomyopathy diagnosed at that time with ejection fraction territory 35%.  On appropriate medications which will continue recently she went into trouble because of her lung condition.  She required hospitalization for pneumonia as well as exacerbation of COPD.  Seems to be doing a little better still required high dosages of steroids which obviously is concerning.  She does have some swelling of lower extremities does have some paroxysmal nocturnal dyspnea I will try to figure out what part of her symptomatology is related to her heart.  Denies have any chest pain tightness squeezing pressure burning chest.  Past Medical History:  Diagnosis Date  . Acute combined systolic and diastolic CHF, NYHA class 3 (HCC)   . Bronchitis due to tobacco use 08/2017  . Chronic respiratory failure (HCC)   . COPD (chronic obstructive pulmonary disease) (HCC)   . Coronary artery disease   . Diastolic CHF (HCC)   . Obesity hypoventilation syndrome (HCC)   . Pneumonia 08/2017  . Systolic CHF with reduced left ventricular function, NYHA class 3 (HCC) 09/13/2017    Past Surgical History:  Procedure Laterality Date  . CESAREAN SECTION    . CORONARY STENT INTERVENTION N/A 09/18/2017   Procedure: CORONARY STENT INTERVENTION;  Surgeon: Tonny Bollman, MD;  Location: [facility] INVASIVE CV LAB;  Service: Cardiovascular;  Laterality: N/A;  prox LAD  . LEFT HEART CATH AND CORONARY ANGIOGRAPHY N/A 09/18/2017   Procedure: LEFT HEART CATH AND CORONARY ANGIOGRAPHY;  Surgeon: Tonny Bollman, MD;  Location: [facility] INVASIVE CV LAB;  Service: Cardiovascular;  Laterality: N/A;  . THYROID SURGERY      Current Medications: Current Meds  Medication Sig  . albuterol (PROVENTIL HFA;VENTOLIN HFA) 108 (90 Base) MCG/ACT inhaler Inhale 2 puffs into the lungs every 4 (four) hours as needed for wheezing or shortness of breath.  . ALPRAZolam (XANAX) 0.25 MG tablet Take 1 tablet by mouth 3 (three) times daily as needed.  Marland Kitchen aspirin EC 81 MG EC tablet Take 1 tablet (81 mg total) by mouth daily.  Marland Kitchen atorvastatin (LIPITOR) 80 MG tablet Take 1 tablet (80 mg total) by mouth daily at 6 PM.  . clopidogrel (PLAVIX) 75 MG tablet Take 1 tablet (75 mg total) by mouth daily with breakfast.  . fluticasone furoate-vilanterol (BREO ELLIPTA) 100-25 MCG/INH AEPB Inhale 1 puff into the lungs daily.  . furosemide (LASIX) 40 MG tablet Take 40 mg by mouth daily as needed for edema.  Marland Kitchen ipratropium-albuterol (DUONEB) 0.5-2.5 (3) MG/3ML SOLN Take 3 mLs by nebulization every 6 (six) hours as needed (short of breath).   Marland Kitchen levothyroxine (SYNTHROID, LEVOTHROID) 150 MCG tablet Take 150 mcg by mouth daily before breakfast.   . lisinopril (PRINIVIL,ZESTRIL) 20 MG tablet Take 0.5 tablets (10 mg total) by mouth at bedtime.  . metoprolol succinate (TOPROL-XL) 50 MG 24 hr tablet Take 1 tablet (50 mg total) by mouth daily. Take with or immediately following a meal.  . predniSONE (DELTASONE)  10 MG tablet Take 1 tablet by mouth 2 (two) times daily.      Allergies:   Iodine; Lovenox [enoxaparin]; and Shellfish allergy   Social History   Socioeconomic History  . Marital status: Married    Spouse name: Not on file  . Number of children: 3  . Years of education: Not on file  . Highest education level: Not on file  Occupational History  . 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 Financial resource strain: Not on file  . Food  insecurity:    Worry: Not on file    Inability: Not on file  . Transportation needs:    Medical: Not on file    Non-medical: Not on file  Tobacco Use  . Smoking status: Former Smoker    Packs/day: 0.50    Years: 14.00    Pack years: 7.00    Types: Cigarettes    Last attempt to quit: 09/12/2017    Years since quitting: 0.6  . Smokeless tobacco: Never Used  Substance and Sexual Activity  . Alcohol use: No    Frequency: Never  . Drug use: No  . Sexual activity: Not on file  Lifestyle  . Physical activity:    Days per week: Not on file    Minutes per session: Not on file  . Stress: Very much  Relationships  . Social connections:    Talks on phone: Not on file    Gets together: Not on file    Attends religious service: Not on file    Active member of club or organization: Not on file    Attends meetings of clubs or organizations: Not on file    Relationship status: Not on file  Other Topics Concern  . Not on file  Social History Narrative  . Not on file     Family History: The patient's family history includes Diabetes in her father; Heart disease in her father and mother; Hypertension in her mother. ROS:   Please see the history of present illness.    All 14 point review of systems negative except as described per history of present illness  EKGs/Labs/Other Studies Reviewed:      Recent Labs: 09/14/2017: ALT 23; B Natriuretic Peptide 501.7; TSH 1.278 09/15/2017: Magnesium 2.9 09/19/2017: Hemoglobin 12.9; Platelets 217 10/08/2017: NT-Pro BNP 626 11/07/2017: BUN 19; Creatinine, Ser 1.22; Potassium 3.9; Sodium 142  Recent Lipid Panel No results found for: CHOL, TRIG, HDL, CHOLHDL, VLDL, LDLCALC, LDLDIRECT  Physical Exam:    VS:  BP 140/70   Pulse (!) 101   Ht 5\' 1"  (1.549 m)   Wt 237 lb 9.6 oz (107.8 kg)   SpO2 97%   BMI 44.89 kg/m     Wt Readings from Last 3 Encounters:  05/07/18 237 lb 9.6 oz (107.8 kg)  01/01/18 218 lb 12.8 oz (99.2 kg)  11/07/17 222 lb 12.8 oz  (101.1 kg)     GEN:  Well nourished, well developed in no acute distress HEENT: Normal NECK: No JVD; No carotid bruits LYMPHATICS: No lymphadenopathy CARDIAC: RRR, no murmurs, no rubs, no gallops RESPIRATORY:  Clear to auscultation without rales, wheezing or rhonchi  ABDOMEN: Soft, non-tender, non-distended MUSCULOSKELETAL:  No edema; No deformity  SKIN: Warm and dry LOWER EXTREMITIES: no swelling NEUROLOGIC:  Alert and oriented x 3 PSYCHIATRIC:  Normal affect   ASSESSMENT:    1. Coronary artery disease involving native coronary artery of native heart without angina pectoris  2. Simple chronic bronchitis (HCC)   3. Dyslipidemia   4. Dyspnea on exertion    PLAN:    In order of problems listed above:  1. Coronary artery disease appears to be stable from that point of view.  She is on aspirin as well as Plavix which I will continue. 2. Bronchitis managed by pulmonary 3. Dyslipidemia last LDL I have is from March which was only 18 we will continue present management. 4. Dyspnea on exertion multifactorial I will ask her to have proBNP Chem-7 done today echocardiogram will be repeated to recheck left ventricular ejection fraction. 5. Cardiomyopathy on lisinopril not on beta-blocker because of bronchospasm.  We will repeat echocardiogram to recheck left ventricular ejection fraction.   Medication Adjustments/Labs and Tests Ordered: Current medicines are reviewed at length with the patient today.  Concerns regarding medicines are outlined above.  No orders of the defined types were placed in this encounter.  Medication changes: No orders of the defined types were placed in this encounter. signed, Georgeanna Lea, MD, [facility] 05/07/2018 11:36 AM    [facility]

## 2018-05-07 NOTE — Patient Instructions (Signed)
Medication Instructions:  Your physician recommends that you continue on your current medications as directed. Please refer to the Current Medication list given to you today.  If you need a refill on your cardiac medications before your next appointment, please call your pharmacy.   Lab work: Your physician recommends that you return for lab work today: BMP, TSH, BNP   If you have labs (blood work) drawn today and your tests are completely normal, you will receive your results only by: Marland Kitchen MyChart Message (if you have MyChart) OR . A paper copy in the mail If you have any lab test that is abnormal or we need to change your treatment, we will call you to review the results.  Testing/Procedures: Your physician has requested that you have an echocardiogram. Echocardiography is a painless test that uses sound waves to create images of your heart. It provides your doctor with information about the size and shape of your heart and how well your heart's chambers and valves are working. This procedure takes approximately one hour. There are no restrictions for this procedure.  05/08/18 1:15 at [facility]    Follow-Up: At [facility], you and your health needs are our priority.  As part of our continuing mission to provide you with exceptional heart care, we have created designated Provider Care Teams.  These Care Teams include your primary Cardiologist (physician) and Advanced Practice Providers (APPs -  Physician Assistants and Nurse Practitioners) who all work together to provide you with the care you need, when you need it. You will need a follow up appointment in 1 months.  Please call our office 2 months in advance to schedule this appointment.  You may see No primary care provider on file. or another member of our [employer] Provider Team in [place]: Norman Herrlich, MD . Belva Crome, MD .   Any Other Special Instructions Will Be Listed Below (If Applicable).

## 2018-05-08 ENCOUNTER — Ambulatory Visit (HOSPITAL_BASED_OUTPATIENT_CLINIC_OR_DEPARTMENT_OTHER)
Admission: RE | Admit: 2018-05-08 | Discharge: 2018-05-08 | Disposition: A | Discharge status: Home / Self Care | Payer: BLUE CROSS/BLUE SHIELD | Source: Ambulatory Visit | Attending: Cardiology | Admitting: Cardiology

## 2018-05-08 DIAGNOSIS — I2510 Atherosclerotic heart disease of native coronary artery without angina pectoris: Secondary | ICD-10-CM | POA: Insufficient documentation

## 2018-05-08 DIAGNOSIS — I509 Heart failure, unspecified: Secondary | ICD-10-CM | POA: Diagnosis not present

## 2018-05-08 DIAGNOSIS — J449 Chronic obstructive pulmonary disease, unspecified: Secondary | ICD-10-CM | POA: Diagnosis not present

## 2018-05-08 DIAGNOSIS — R0609 Other forms of dyspnea: Secondary | ICD-10-CM | POA: Diagnosis present

## 2018-05-08 LAB — BASIC METABOLIC PANEL
BUN / CREAT RATIO: 18 (ref 9–23)
BUN: 22 mg/dL (ref 6–24)
CHLORIDE: 98 mmol/L (ref 96–106)
CO2: 22 mmol/L (ref 20–29)
Calcium: 9.5 mg/dL (ref 8.7–10.2)
Creatinine, Ser: 1.2 mg/dL — ABNORMAL HIGH (ref 0.57–1.00)
GFR calc non Af Amer: 54 mL/min/{1.73_m2} — ABNORMAL LOW (ref >59)
GFR, EST AFRICAN AMERICAN: 62 mL/min/{1.73_m2} (ref >59)
Glucose: 224 mg/dL — ABNORMAL HIGH (ref 65–99)
POTASSIUM: 4.9 mmol/L (ref 3.5–5.2)
SODIUM: 137 mmol/L (ref 134–144)

## 2018-05-08 LAB — PRO B NATRIURETIC PEPTIDE: NT-Pro BNP: 221 pg/mL (ref 0–249)

## 2018-05-08 LAB — TSH: TSH: 0.066 u[IU]/mL — AB (ref 0.450–4.500)

## 2018-05-08 NOTE — Progress Notes (Signed)
  Echocardiogram 2D Echocardiogram has been performed.  Katie Rasmussen Katie Rasmussen Katie Rasmussen 05/08/2018, 1:28 PM

## 2018-06-18 ENCOUNTER — Other Ambulatory Visit: Payer: Self-pay | Admitting: Cardiology

## 2018-06-18 ENCOUNTER — Encounter: Payer: Self-pay | Admitting: Cardiology

## 2018-06-18 ENCOUNTER — Ambulatory Visit (INDEPENDENT_AMBULATORY_CARE_PROVIDER_SITE_OTHER): Payer: BLUE CROSS/BLUE SHIELD | Admitting: Cardiology

## 2018-06-18 VITALS — BP 120/60 | HR 95 | Ht 61.0 in | Wt 241.6 lb

## 2018-06-18 DIAGNOSIS — J410 Simple chronic bronchitis: Secondary | ICD-10-CM | POA: Diagnosis not present

## 2018-06-18 DIAGNOSIS — R0609 Other forms of dyspnea: Secondary | ICD-10-CM | POA: Diagnosis not present

## 2018-06-18 DIAGNOSIS — E785 Hyperlipidemia, unspecified: Secondary | ICD-10-CM | POA: Diagnosis not present

## 2018-06-18 DIAGNOSIS — R0600 Dyspnea, unspecified: Secondary | ICD-10-CM

## 2018-06-18 DIAGNOSIS — I2510 Atherosclerotic heart disease of native coronary artery without angina pectoris: Secondary | ICD-10-CM

## 2018-06-18 MED ORDER — METOPROLOL SUCCINATE ER 25 MG PO TB24: 25.0000 mg | ORAL_TABLET | Freq: Every day | ORAL | 1 refills | Status: DC

## 2018-06-18 NOTE — Patient Instructions (Signed)
Medication Instructions:  Your physician has recommended you make the following change in your medication:  DECREASE: Metoprolol to 25 mg daily   If you need a refill on your cardiac medications before your next appointment, please call your pharmacy.   Lab work: None.  If you have labs (blood work) drawn today and your tests are completely normal, you will receive your results only by: Marland Kitchen MyChart Message (if you have MyChart) OR . A paper copy in the mail If you have any lab test that is abnormal or we need to change your treatment, we will call you to review the results.  Testing/Procedures: None.   Follow-Up: At [facility], you and your health needs are our priority.  As part of our continuing mission to provide you with exceptional heart care, we have created designated Provider Care Teams.  These Care Teams include your primary Cardiologist (physician) and Advanced Practice Providers (APPs -  Physician Assistants and Nurse Practitioners) who all work together to provide you with the care you need, when you need it. You will need a follow up appointment in 1 months.  Please call our office 2 months in advance to schedule this appointment.  You may see Gypsy Balsam, MD or another member of our [facility] Provider Team in [employer]: Norman Herrlich, MD . Belva Crome, MD  Any Other Special Instructions Will Be Listed Below (If Applicable).

## 2018-06-18 NOTE — Progress Notes (Signed)
Cardiology Office Note:    Date:  06/18/2018   ID:  Katie Rasmussen, DOB Aug 02, 1970, MRN [id number]  PCP:  Paulina Fusi, MD  Cardiologist:  Gypsy Balsam, MD    Referring MD: Paulina Fusi, MD   Chief Complaint  Patient presents with  . Follow up on Echo  Still having shortness of breath  History of Present Illness:    Katie Rasmussen is a 47 y.o. female with coronary artery disease status post non-STEMI that required stenting in March 2019.  Another struggle that she is experiencing is the fact that she got advanced COPD required multiple high dosage of the steroid given by pulmonologist.  She is still doing fair complaining of having shortness of breath on the physical exam wheezes luckily I repeat her echocardiogram which showed preserved left ventricular ejection fraction I did also markers of congestive heart failure those were negative therefore I think we have about the need to lower her beta-blocker obviously beta-blocker does have antiarrhythmic properties but with her advanced COPD required negative dosages of steroids I think that will be opportunity to improve her lung situation without putting her at significant risk from cardiac events.  I will cut down her metoprolol to 25 mg daily.  I will see her back in my office in about a month.  I asked her to have Chem-7 done to make sure her kidney function potassium is acceptable.  Past Medical History:  Diagnosis Date  . Acute combined systolic and diastolic CHF, NYHA class 3 (HCC)   . Bronchitis due to tobacco use 08/2017  . Chronic respiratory failure (HCC)   . COPD (chronic obstructive pulmonary disease) (HCC)   . Coronary artery disease   . Diastolic CHF (HCC)   . Obesity hypoventilation syndrome (HCC)   . Pneumonia 08/2017  . Systolic CHF with reduced left ventricular function, NYHA class 3 (HCC) 09/13/2017    Past Surgical History:  Procedure Laterality Date  . CESAREAN SECTION    . CORONARY STENT  INTERVENTION N/A 09/18/2017   Procedure: CORONARY STENT INTERVENTION;  Surgeon: Tonny Bollman, MD;  Location: [facility] INVASIVE CV LAB;  Service: Cardiovascular;  Laterality: N/A;  prox LAD  . LEFT HEART CATH AND CORONARY ANGIOGRAPHY N/A 09/18/2017   Procedure: LEFT HEART CATH AND CORONARY ANGIOGRAPHY;  Surgeon: Tonny Bollman, MD;  Location: [facility] INVASIVE CV LAB;  Service: Cardiovascular;  Laterality: N/A;  . THYROID SURGERY      Current Medications: Current Meds  Medication Sig  . albuterol (PROVENTIL HFA;VENTOLIN HFA) 108 (90 Base) MCG/ACT inhaler Inhale 2 puffs into the lungs every 4 (four) hours as needed for wheezing or shortness of breath.  . ALPRAZolam (XANAX) 0.25 MG tablet Take 1 tablet by mouth 3 (three) times daily as needed.  Marland Kitchen aspirin EC 81 MG EC tablet Take 1 tablet (81 mg total) by mouth daily.  Marland Kitchen atorvastatin (LIPITOR) 80 MG tablet Take 1 tablet (80 mg total) by mouth daily at 6 PM.  . clopidogrel (PLAVIX) 75 MG tablet Take 1 tablet (75 mg total) by mouth daily with breakfast.  . fluticasone furoate-vilanterol (BREO ELLIPTA) 100-25 MCG/INH AEPB Inhale 1 puff into the lungs daily.  . furosemide (LASIX) 40 MG tablet Take 40 mg by mouth daily as needed for edema.  Marland Kitchen ipratropium-albuterol (DUONEB) 0.5-2.5 (3) MG/3ML SOLN Take 3 mLs by nebulization every 6 (six) hours as needed (short of breath).   Marland Kitchen levothyroxine (SYNTHROID, LEVOTHROID) 150 MCG tablet Take 150 mcg by mouth daily before breakfast.   .  lisinopril (PRINIVIL,ZESTRIL) 20 MG tablet Take 0.5 tablets (10 mg total) by mouth at bedtime.  . metoprolol succinate (TOPROL-XL) 50 MG 24 hr tablet Take 1 tablet (50 mg total) by mouth daily. Take with or immediately following a meal.  . predniSONE (DELTASONE) 10 MG tablet Take 1 tablet by mouth 2 (two) times daily.      Allergies:   Iodine; Lovenox [enoxaparin]; and Shellfish allergy   Social History   Socioeconomic History  . Marital status: Married    Spouse name: Not on file  .  Number of children: 3  . Years of education: Not on file  . Highest education level: Not on file  Occupational History  . 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 Financial resource strain: Not on file  . Food insecurity:    Worry: Not on file    Inability: Not on file  . Transportation needs:    Medical: Not on file    Non-medical: Not on file  Tobacco Use  . Smoking status: Former Smoker    Packs/day: 0.50    Years: 14.00    Pack years: 7.00    Types: Cigarettes    Last attempt to quit: 09/12/2017    Years since quitting: 0.7  . Smokeless tobacco: Never Used  Substance and Sexual Activity  . Alcohol use: No    Frequency: Never  . Drug use: No  . Sexual activity: Not on file  Lifestyle  . Physical activity:    Days per week: Not on file    Minutes per session: Not on file  . Stress: Very much  Relationships  . Social connections:    Talks on phone: Not on file    Gets together: Not on file    Attends religious service: Not on file    Active member of club or organization: Not on file    Attends meetings of clubs or organizations: Not on file    Relationship status: Not on file  Other Topics Concern  . Not on file  Social History Narrative  . Not on file     Family History: The patient's family history includes Diabetes in her father; Heart disease in her father and mother; Hypertension in her mother. ROS:   Please see the history of present illness.    All 14 point review of systems negative except as described per history of present illness  EKGs/Labs/Other Studies Reviewed:      Recent Labs: 09/14/2017: ALT 23; B Natriuretic Peptide 501.7 09/15/2017: Magnesium 2.9 09/19/2017: Hemoglobin 12.9; Platelets 217 05/07/2018: BUN 22; Creatinine, Ser 1.20; NT-Pro BNP 221; Potassium 4.9; Sodium 137; TSH 0.066  Recent Lipid Panel No results found for: CHOL, TRIG, HDL, CHOLHDL, VLDL, LDLCALC, LDLDIRECT  Physical Exam:    VS:  BP 120/60    Pulse 95   Ht 5\' 1"  (1.549 m)   Wt 241 lb 9.6 oz (109.6 kg)   SpO2 98%   BMI 45.65 kg/m     Wt Readings from Last 3 Encounters:  06/18/18 241 lb 9.6 oz (109.6 kg)  05/07/18 237 lb 9.6 oz (107.8 kg)  01/01/18 218 lb 12.8 oz (99.2 kg)     GEN:  Well nourished, well developed in no acute distress HEENT: Normal NECK: No JVD; No carotid bruits LYMPHATICS: No lymphadenopathy CARDIAC: RRR, no murmurs, no rubs, no gallops RESPIRATORY:  Clear to auscultation with multiple wheezes both sides ABDOMEN: Soft, non-tender, non-distended MUSCULOSKELETAL:  No edema; No  deformity  SKIN: Warm and dry LOWER EXTREMITIES: no swelling NEUROLOGIC:  Alert and oriented x 3 PSYCHIATRIC:  Normal affect   ASSESSMENT:    1. Coronary artery disease involving native coronary artery of native heart without angina pectoris   2. Dyslipidemia   3. Dyspnea on exertion   4. Simple chronic bronchitis (HCC)    PLAN:    In order of problems listed above:  1. Coronary artery disease stable on appropriate medication we will continue dual antiplatelet therapy until spring next year. 2. Dyslipidemia continue statin Lipitor 80 daily 3. Dyspnea on exertion.  Plan will be to cut down the Toprol to only 25 daily 4. Chronic bronchitis managed by pulmonary.   Medication Adjustments/Labs and Tests Ordered: Current medicines are reviewed at length with the patient today.  Concerns regarding medicines are outlined above.  No orders of the defined types were placed in this encounter.  Medication changes: No orders of the defined types were placed in this encounter.   Signed, Georgeanna Lea, MD, [facility] 06/18/2018 11:14 AM    [facility]

## 2018-07-04 ENCOUNTER — Encounter: Payer: Self-pay | Admitting: Cardiology

## 2018-07-04 DIAGNOSIS — D72829 Elevated white blood cell count, unspecified: Secondary | ICD-10-CM

## 2018-07-04 DIAGNOSIS — J189 Pneumonia, unspecified organism: Secondary | ICD-10-CM

## 2018-07-04 DIAGNOSIS — N183 Chronic kidney disease, stage 3 (moderate): Secondary | ICD-10-CM

## 2018-07-04 DIAGNOSIS — I10 Essential (primary) hypertension: Secondary | ICD-10-CM

## 2018-07-04 DIAGNOSIS — J9600 Acute respiratory failure, unspecified whether with hypoxia or hypercapnia: Secondary | ICD-10-CM

## 2018-07-04 DIAGNOSIS — E872 Acidosis: Secondary | ICD-10-CM

## 2018-07-19 ENCOUNTER — Encounter: Payer: Self-pay | Admitting: Cardiology

## 2018-07-19 ENCOUNTER — Telehealth: Payer: Self-pay | Admitting: Emergency Medicine

## 2018-07-19 ENCOUNTER — Ambulatory Visit: Payer: BLUE CROSS/BLUE SHIELD | Admitting: Cardiology

## 2018-07-19 VITALS — BP 78/40 | HR 113 | Ht 61.0 in | Wt 238.4 lb

## 2018-07-19 DIAGNOSIS — E662 Morbid (severe) obesity with alveolar hypoventilation: Secondary | ICD-10-CM | POA: Diagnosis not present

## 2018-07-19 DIAGNOSIS — J40 Bronchitis, not specified as acute or chronic: Secondary | ICD-10-CM

## 2018-07-19 DIAGNOSIS — I951 Orthostatic hypotension: Secondary | ICD-10-CM | POA: Diagnosis not present

## 2018-07-19 DIAGNOSIS — I2510 Atherosclerotic heart disease of native coronary artery without angina pectoris: Secondary | ICD-10-CM

## 2018-07-19 DIAGNOSIS — J410 Simple chronic bronchitis: Secondary | ICD-10-CM

## 2018-07-19 DIAGNOSIS — Z720 Tobacco use: Secondary | ICD-10-CM

## 2018-07-19 DIAGNOSIS — I5041 Acute combined systolic (congestive) and diastolic (congestive) heart failure: Secondary | ICD-10-CM

## 2018-07-19 NOTE — Telephone Encounter (Signed)
Roe Coombs, PA from [facility] calling in regards to patient. She reports that the patient is no longer hypotensive and needs recommendation on patient's fluid intake. I reached to Dr. Bing Matter, he asked for the PA to call him. I provided her with his phone number and informed her he would like to speak with her she verbally understands and will call him.

## 2018-07-19 NOTE — Progress Notes (Signed)
Cardiology Office Note:    Date:  07/19/2018   ID:  Katie Rasmussen, DOB [birth year], MRN [id number]  PCP:  Paulina Fusi, MD  Cardiologist:  Gypsy Balsam, MD    Referring MD: Paulina Fusi, MD   Chief Complaint  Patient presents with  . 1 month follow up  Not doing well  History of Present Illness:    Katie Rasmussen is a 48 y.o. female with multiple medical problems which include coronary artery disease.  Last cardiac catheterization done in March 2019 showing 75% lesion of proximal LAD which was successfully addressed with stenting however ejection fraction at that time was 4045% however repeat of echocardiogram done on May 08, 2017 showed normalization of left ventricular ejection fraction.  She also got advanced COPD required multiple hospitalization in the matter-of-fact in December she was in the hospital treated for pneumonia and sepsis she was discharged from the hospital surprisingly in spite of the fact that she is been taking steroids for years at quite high dosages she left hospital without any steroids she comes today to my office for regular follow-up she is very weak tired exhausted with blood pressure 78/40 short of breath and tachycardic.  She said that she feels very bad and very weak very tired.  Denies have any fever or chills.  Past Medical History:  Diagnosis Date  . Acute combined systolic and diastolic CHF, NYHA class 3 (HCC)   . Bronchitis due to tobacco use 08/2017  . Chronic respiratory failure (HCC)   . COPD (chronic obstructive pulmonary disease) (HCC)   . Coronary artery disease   . Diastolic CHF (HCC)   . Obesity hypoventilation syndrome (HCC)   . Pneumonia 08/2017  . Systolic CHF with reduced left ventricular function, NYHA class 3 (HCC) 09/13/2017    Past Surgical History:  Procedure Laterality Date  . CESAREAN SECTION    . CORONARY STENT INTERVENTION N/A 09/18/2017   Procedure: CORONARY STENT INTERVENTION;  Surgeon: Tonny Bollman, MD;  Location: [facility] INVASIVE CV LAB;  Service: Cardiovascular;  Laterality: N/A;  prox LAD  . LEFT HEART CATH AND CORONARY ANGIOGRAPHY N/A 09/18/2017   Procedure: LEFT HEART CATH AND CORONARY ANGIOGRAPHY;  Surgeon: Tonny Bollman, MD;  Location: [facility] INVASIVE CV LAB;  Service: Cardiovascular;  Laterality: N/A;  . THYROID SURGERY      Current Medications: No outpatient medications have been marked as taking for the 07/19/18 encounter (Office Visit) with Georgeanna Lea, MD.     Allergies:   Iodine; Lovenox [enoxaparin]; and Shellfish allergy   Social History   Socioeconomic History  . Marital status: Married    Spouse name: Not on file  . Number of children: 3  . Years of education: Not on file  . Highest education level: Not on file  Occupational History  . 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 Financial resource strain: Not on file  . Food insecurity:    Worry: Not on file    Inability: Not on file  . Transportation needs:    Medical: Not on file    Non-medical: Not on file  Tobacco Use  . Smoking status: Former Smoker    Packs/day: 0.50    Years: 14.00    Pack years: 7.00    Types: Cigarettes    Last attempt to quit: 09/12/2017    Years since quitting: 0.8  . Smokeless tobacco: Never Used  Substance and Sexual Activity  . Alcohol use: No  Frequency: Never  . Drug use: No  . Sexual activity: Not on file  Lifestyle  . Physical activity:    Days per week: Not on file    Minutes per session: Not on file  . Stress: Very much  Relationships  . Social connections:    Talks on phone: Not on file    Gets together: Not on file    Attends religious service: Not on file    Active member of club or organization: Not on file    Attends meetings of clubs or organizations: Not on file    Relationship status: Not on file  Other Topics Concern  . Not on file  Social History Narrative  . Not on file     Family History: The patient's  family history includes Diabetes in her father; Heart disease in her father and mother; Hypertension in her mother. ROS:   Please see the history of present illness.    All 14 point review of systems negative except as described per history of present illness  EKGs/Labs/Other Studies Reviewed:      Recent Labs: 09/14/2017: ALT 23; B Natriuretic Peptide 501.7 09/15/2017: Magnesium 2.9 09/19/2017: Hemoglobin 12.9; Platelets 217 05/07/2018: BUN 22; Creatinine, Ser 1.20; NT-Pro BNP 221; Potassium 4.9; Sodium 137; TSH 0.066  Recent Lipid Panel No results found for: CHOL, TRIG, HDL, CHOLHDL, VLDL, LDLCALC, LDLDIRECT  Physical Exam:    VS:  BP (!) 78/40   Pulse (!) 113   Ht 5\' 1"  (1.549 m)   Wt 238 lb 6.4 oz (108.1 kg)   SpO2 98%   BMI 45.05 kg/m     Wt Readings from Last 3 Encounters:  07/19/18 238 lb 6.4 oz (108.1 kg)  06/18/18 241 lb 9.6 oz (109.6 kg)  05/07/18 237 lb 9.6 oz (107.8 kg)     GEN:  Well nourished, morbidly obese.  In mild respiratory distress HEENT: Normal NECK: No JVD; No carotid bruits LYMPHATICS: No lymphadenopathy CARDIAC: RRR, no murmurs, no rubs, no gallops RESPIRATORY:  Clear to auscultation without rales, bilateral wheezes and rhonchi ABDOMEN: Soft, non-tender, non-distended MUSCULOSKELETAL:  No edema; No deformity  SKIN: Warm and dry LOWER EXTREMITIES: no swelling NEUROLOGIC:  Alert and oriented x 3 PSYCHIATRIC:  Normal affect   ASSESSMENT:    1. Simple chronic bronchitis (HCC)   2. Acute combined systolic and diastolic CHF, NYHA class 3 (HCC)   3. Coronary artery disease involving native coronary artery of native heart without angina pectoris   4. Obesity hypoventilation syndrome (HCC)   5. Bronchitis due to tobacco use    PLAN:    In order of problems listed above:  1. Hypotension/shock blood pressure 78/40.  Ambulance has been called.  She will be transferred to the emergency room.  I suspect part of the problem is abrupt withdrawal from  steroids.  Obviously because of her multiple medical problems she needs to have a quite extensive evaluation again that include chest x-ray and routine laboratory test. 2. Systolic/diastolic congestive heart failure.  Last echocardiogram done in October showed preserved left ventricular ejection fraction.  Will modify her medication since she is hypotensive. 3. Hypoventilation syndrome.  Again she will be transferred to hospital for management.   Medication Adjustments/Labs and Tests Ordered: Current medicines are reviewed at length with the patient today.  Concerns regarding medicines are outlined above.  No orders of the defined types were placed in this encounter.  Medication changes: No orders of the defined types were placed in this  encounter.   Signed, Georgeanna Leaobert J. Analleli Gierke, MD, [facility] 07/19/2018 11:03 AM    [facility]

## 2018-07-19 NOTE — Telephone Encounter (Signed)
Error

## 2018-07-20 DIAGNOSIS — I951 Orthostatic hypotension: Secondary | ICD-10-CM | POA: Diagnosis not present

## 2018-08-20 ENCOUNTER — Other Ambulatory Visit: Payer: Self-pay | Admitting: Cardiology

## 2018-09-04 ENCOUNTER — Ambulatory Visit (INDEPENDENT_AMBULATORY_CARE_PROVIDER_SITE_OTHER): Payer: BLUE CROSS/BLUE SHIELD | Admitting: Cardiology

## 2018-09-04 ENCOUNTER — Encounter: Payer: Self-pay | Admitting: Cardiology

## 2018-09-04 VITALS — BP 120/62 | HR 92 | Ht 61.0 in | Wt 239.0 lb

## 2018-09-04 DIAGNOSIS — I255 Ischemic cardiomyopathy: Secondary | ICD-10-CM | POA: Diagnosis not present

## 2018-09-04 DIAGNOSIS — I2510 Atherosclerotic heart disease of native coronary artery without angina pectoris: Secondary | ICD-10-CM | POA: Diagnosis not present

## 2018-09-04 DIAGNOSIS — J449 Chronic obstructive pulmonary disease, unspecified: Secondary | ICD-10-CM

## 2018-09-04 DIAGNOSIS — J410 Simple chronic bronchitis: Secondary | ICD-10-CM

## 2018-09-04 DIAGNOSIS — E785 Hyperlipidemia, unspecified: Secondary | ICD-10-CM | POA: Diagnosis not present

## 2018-09-04 NOTE — Patient Instructions (Signed)
Medication Instructions:  Your physician recommends that you continue on your current medications as directed. Please refer to the Current Medication list given to you today.  If you need a refill on your cardiac medications before your next appointment, please call your pharmacy.   Lab work: Your physician recommends that you return for lab work today: bmp, and bnp  If you have labs (blood work) drawn today and your tests are completely normal, you will receive your results only by: Marland Kitchen MyChart Message (if you have MyChart) OR . A paper copy in the mail If you have any lab test that is abnormal or we need to change your treatment, we will call you to review the results.  Testing/Procedures: None.    Follow-Up: At [facility], you and your health needs are our priority.  As part of our continuing mission to provide you with exceptional heart care, we have created designated Provider Care Teams.  These Care Teams include your primary Cardiologist (physician) and Advanced Practice Providers (APPs -  Physician Assistants and Nurse Practitioners) who all work together to provide you with the care you need, when you need it. You will need a follow up appointment in 2 months.  Please call our office 2 months in advance to schedule this appointment.  You may see Gypsy Balsam, MD or another member of our [facility] Provider Team in [place]: Norman Herrlich, MD . Belva Crome, MD  Any Other Special Instructions Will Be Listed Below (If Applicable).  Dr. Bing Matter has referred you to see Pulmonology for copd their office should cal you within one week for an appointment. If not pleas call our office.

## 2018-09-04 NOTE — Progress Notes (Signed)
Cardiology Office Note:    Date:  09/04/2018   ID:  Katie Rasmussen, DOB 05-23-1971, MRN [id number]  PCP:  Paulina Fusi, MD  Cardiologist:  Gypsy Balsam, MD    Referring MD: Paulina Fusi, MD   Chief Complaint  Patient presents with  . 1 month follow up  Doing better  History of Present Illness:    Katie Rasmussen is a 48 y.o. female with complex past medical history.  Cardiac wise she had cardiac catheterization done in March 2019 showing 75% lesion of the proximal LAD which was successfully addressed with stenting ejection fraction at that time was 4045%.  Repeated echocardiogram in October 2019 showed normalization of left ventricular ejection fraction another problem that she has is advanced COPD last time when I seen her she had abrupt withdrawal of her steroids and she was hypotensive for her to hospital she was appropriately treated and feels much better still complaining of shortness of breath recent visit in the emergency room because of shortness of breath.  Of course the question now is if her shortness of breath more COPD/lung related or CHF.  Past Medical History:  Diagnosis Date  . Acute combined systolic and diastolic CHF, NYHA class 3 (HCC)   . Bronchitis due to tobacco use 08/2017  . Chronic respiratory failure (HCC)   . COPD (chronic obstructive pulmonary disease) (HCC)   . Coronary artery disease   . Diastolic CHF (HCC)   . Obesity hypoventilation syndrome (HCC)   . Pneumonia 08/2017  . Systolic CHF with reduced left ventricular function, NYHA class 3 (HCC) 09/13/2017    Past Surgical History:  Procedure Laterality Date  . CESAREAN SECTION    . CORONARY STENT INTERVENTION N/A 09/18/2017   Procedure: CORONARY STENT INTERVENTION;  Surgeon: Tonny Bollman, MD;  Location: [facility] INVASIVE CV LAB;  Service: Cardiovascular;  Laterality: N/A;  prox LAD  . LEFT HEART CATH AND CORONARY ANGIOGRAPHY N/A 09/18/2017   Procedure: LEFT HEART CATH AND CORONARY  ANGIOGRAPHY;  Surgeon: Tonny Bollman, MD;  Location: [facility] INVASIVE CV LAB;  Service: Cardiovascular;  Laterality: N/A;  . THYROID SURGERY      Current Medications: Current Meds  Medication Sig  . albuterol (PROVENTIL HFA;VENTOLIN HFA) 108 (90 Base) MCG/ACT inhaler Inhale 2 puffs into the lungs every 4 (four) hours as needed for wheezing or shortness of breath.  . ALPRAZolam (XANAX) 0.25 MG tablet Take 1 tablet by mouth 3 (three) times daily as needed.  Marland Kitchen aspirin EC 81 MG EC tablet Take 1 tablet (81 mg total) by mouth daily.  Marland Kitchen atorvastatin (LIPITOR) 80 MG tablet Take 1 tablet (80 mg total) by mouth daily at 6 PM.  . clopidogrel (PLAVIX) 75 MG tablet TAKE 1 TABLET BY MOUTH DAILY WITH BREAKFAST.  . fluticasone furoate-vilanterol (BREO ELLIPTA) 100-25 MCG/INH AEPB Inhale 1 puff into the lungs daily.  . furosemide (LASIX) 40 MG tablet Take 40 mg by mouth daily as needed for edema.  Marland Kitchen ipratropium-albuterol (DUONEB) 0.5-2.5 (3) MG/3ML SOLN Take 3 mLs by nebulization every 6 (six) hours as needed (short of breath).   Marland Kitchen levothyroxine (SYNTHROID, LEVOTHROID) 125 MCG tablet Take 125 mcg by mouth daily before breakfast.   . lisinopril (PRINIVIL,ZESTRIL) 2.5 MG tablet Take 1 tablet by mouth daily.  . metoprolol succinate (TOPROL-XL) 25 MG 24 hr tablet Take 1 tablet (25 mg total) by mouth daily. Take with or immediately following a meal. (Patient taking differently: Take 12.5 mg by mouth daily. Take with or immediately following  a meal.)  . predniSONE (DELTASONE) 10 MG tablet Take 1 tablet by mouth daily with breakfast.      Allergies:   Iodine; Lovenox [enoxaparin]; and Shellfish allergy   Social History   Socioeconomic History  . Marital status: Married    Spouse name: Not on file  . Number of children: 3  . Years of education: Not on file  . Highest education level: Not on file  Occupational History  . 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 Financial resource  strain: Not on file  . Food insecurity:    Worry: Not on file    Inability: Not on file  . Transportation needs:    Medical: Not on file    Non-medical: Not on file  Tobacco Use  . Smoking status: Former Smoker    Packs/day: 0.50    Years: 14.00    Pack years: 7.00    Types: Cigarettes    Last attempt to quit: 09/12/2017    Years since quitting: 0.9  . Smokeless tobacco: Never Used  Substance and Sexual Activity  . Alcohol use: No    Frequency: Never  . Drug use: No  . Sexual activity: Not on file  Lifestyle  . Physical activity:    Days per week: Not on file    Minutes per session: Not on file  . Stress: Very much  Relationships  . Social connections:    Talks on phone: Not on file    Gets together: Not on file    Attends religious service: Not on file    Active member of club or organization: Not on file    Attends meetings of clubs or organizations: Not on file    Relationship status: Not on file  Other Topics Concern  . Not on file  Social History Narrative  . Not on file     Family History: The patient's family history includes Diabetes in her father; Heart disease in her father and mother; Hypertension in her mother. ROS:   Please see the history of present illness.    All 14 point review of systems negative except as described per history of present illness  EKGs/Labs/Other Studies Reviewed:      Recent Labs: 09/14/2017: ALT 23; B Natriuretic Peptide 501.7 09/15/2017: Magnesium 2.9 09/19/2017: Hemoglobin 12.9; Platelets 217 05/07/2018: BUN 22; Creatinine, Ser 1.20; NT-Pro BNP 221; Potassium 4.9; Sodium 137; TSH 0.066  Recent Lipid Panel No results found for: CHOL, TRIG, HDL, CHOLHDL, VLDL, LDLCALC, LDLDIRECT  Physical Exam:    VS:  BP 120/62   Pulse 92   Ht 5\' 1"  (1.549 m)   Wt 239 lb (108.4 kg)   SpO2 98%   BMI 45.16 kg/m     Wt Readings from Last 3 Encounters:  09/04/18 239 lb (108.4 kg)  07/19/18 238 lb 6.4 oz (108.1 kg)  06/18/18 241 lb 9.6  oz (109.6 kg)     GEN:  Well nourished, well developed in no acute distress HEENT: Normal NECK: No JVD; No carotid bruits LYMPHATICS: No lymphadenopathy CARDIAC: RRR, no murmurs, no rubs, no gallops RESPIRATORY:  Clear to auscultation without rales, wheezing or rhonchi  ABDOMEN: Soft, non-tender, non-distended MUSCULOSKELETAL:  No edema; No deformity  SKIN: Warm and dry LOWER EXTREMITIES: no swelling NEUROLOGIC:  Alert and oriented x 3 PSYCHIATRIC:  Normal affect   ASSESSMENT:    1. Ischemic cardiomyopathy   2. Coronary artery disease involving native coronary artery of native heart  without angina pectoris   3. Simple chronic bronchitis (HCC)   4. Dyslipidemia    PLAN:    In order of problems listed above:  1. Ischemic cardiomyopathy.  On medication that she is able to tolerate.  I will ask you to have proBNP done today as well as Chem-7 to see if any part of her shortness of breath is related to her congestive heart failure. 2. Coronary disease appears to be stable. 3. Simple bronchitis doing better from that point of view but still this is a very touch and go kind of scenario she does have quite advanced COPD and she requested referral to our pulmonary clinic in [facility] which we will do. 4. Dyslipidemia will continue high intensity statin.   Medication Adjustments/Labs and Tests Ordered: Current medicines are reviewed at length with the patient today.  Concerns regarding medicines are outlined above.  No orders of the defined types were placed in this encounter.  Medication changes: No orders of the defined types were placed in this encounter.   Signed, Georgeanna Lea, MD, [facility] 09/04/2018 11:48 AM    [facility]

## 2018-09-05 LAB — BASIC METABOLIC PANEL
BUN/Creatinine Ratio: 19 (ref 9–23)
BUN: 25 mg/dL — ABNORMAL HIGH (ref 6–24)
CO2: 25 mmol/L (ref 20–29)
Calcium: 10.2 mg/dL (ref 8.7–10.2)
Chloride: 100 mmol/L (ref 96–106)
Creatinine, Ser: 1.32 mg/dL — ABNORMAL HIGH (ref 0.57–1.00)
GFR calc Af Amer: 55 mL/min/{1.73_m2} — ABNORMAL LOW (ref >59)
GFR calc non Af Amer: 48 mL/min/{1.73_m2} — ABNORMAL LOW (ref >59)
Glucose: 104 mg/dL — ABNORMAL HIGH (ref 65–99)
Potassium: 4.2 mmol/L (ref 3.5–5.2)
Sodium: 139 mmol/L (ref 134–144)

## 2018-09-05 LAB — PRO B NATRIURETIC PEPTIDE: NT-Pro BNP: 696 pg/mL — ABNORMAL HIGH (ref 0–249)

## 2018-11-04 ENCOUNTER — Telehealth (INDEPENDENT_AMBULATORY_CARE_PROVIDER_SITE_OTHER): Payer: BLUE CROSS/BLUE SHIELD | Admitting: Cardiology

## 2018-11-04 ENCOUNTER — Telehealth: Payer: Self-pay | Admitting: Cardiology

## 2018-11-04 ENCOUNTER — Encounter: Payer: Self-pay | Admitting: Cardiology

## 2018-11-04 ENCOUNTER — Other Ambulatory Visit: Payer: Self-pay

## 2018-11-04 VITALS — BP 158/80 | HR 100 | Ht 61.0 in | Wt 242.0 lb

## 2018-11-04 DIAGNOSIS — Z720 Tobacco use: Secondary | ICD-10-CM

## 2018-11-04 DIAGNOSIS — I2510 Atherosclerotic heart disease of native coronary artery without angina pectoris: Secondary | ICD-10-CM

## 2018-11-04 DIAGNOSIS — E785 Hyperlipidemia, unspecified: Secondary | ICD-10-CM

## 2018-11-04 DIAGNOSIS — I5041 Acute combined systolic (congestive) and diastolic (congestive) heart failure: Secondary | ICD-10-CM

## 2018-11-04 DIAGNOSIS — J40 Bronchitis, not specified as acute or chronic: Secondary | ICD-10-CM

## 2018-11-04 DIAGNOSIS — R0609 Other forms of dyspnea: Secondary | ICD-10-CM

## 2018-11-04 DIAGNOSIS — I420 Dilated cardiomyopathy: Secondary | ICD-10-CM

## 2018-11-04 DIAGNOSIS — I255 Ischemic cardiomyopathy: Secondary | ICD-10-CM

## 2018-11-04 DIAGNOSIS — R0600 Dyspnea, unspecified: Secondary | ICD-10-CM

## 2018-11-04 NOTE — Patient Instructions (Signed)
Medication Instructions:  Your physician recommends that you continue on your current medications as directed. Please refer to the Current Medication list given to you today.  If you need a refill on your cardiac medications before your next appointment, please call your pharmacy.   Lab work: None If you have labs (blood work) drawn today and your tests are completely normal, you will receive your results only by: Marland Kitchen MyChart Message (if you have MyChart) OR . A paper copy in the mail If you have any lab test that is abnormal or we need to change your treatment, we will call you to review the results.  Testing/Procedures: Your physician has requested that you have an echocardiogram. Echocardiography is a painless test that uses sound waves to create images of your heart. It provides your doctor with information about the size and shape of your heart and how well your heart's chambers and valves are working. This procedure takes approximately one hour. There are no restrictions for this procedure.    Follow-Up: At [facility], you and your health needs are our priority.  As part of our continuing mission to provide you with exceptional heart care, we have created designated Provider Care Teams.  These Care Teams include your primary Cardiologist (physician) and Advanced Practice Providers (APPs -  Physician Assistants and Nurse Practitioners) who all work together to provide you with the care you need, when you need it. You will need a follow up appointment in 3 years.  Any Other Special Instructions Will Be Listed Below (If Applicable).   Echocardiogram An echocardiogram is a procedure that uses painless sound waves (ultrasound) to produce an image of the heart. Images from an echocardiogram can provide important information about:  Signs of coronary artery disease (CAD).  Aneurysm detection. An aneurysm is a weak or damaged part of an artery wall that bulges out from the normal force of  blood pumping through the body.  Heart size and shape. Changes in the size or shape of the heart can be associated with certain conditions, including heart failure, aneurysm, and CAD.  Heart muscle function.  Heart valve function.  Signs of a past heart attack.  Fluid buildup around the heart.  Thickening of the heart muscle.  A tumor or infectious growth around the heart valves. Tell a health care provider about:  Any allergies you have.  All medicines you are taking, including vitamins, herbs, eye drops, creams, and over-the-counter medicines.  Any blood disorders you have.  Any surgeries you have had.  Any medical conditions you have.  Whether you are pregnant or may be pregnant. What are the risks? Generally, this is a safe procedure. However, problems may occur, including:  Allergic reaction to dye (contrast) that may be used during the procedure. What happens before the procedure? No specific preparation is needed. You may eat and drink normally. What happens during the procedure?   An IV tube may be inserted into one of your veins.  You may receive contrast through this tube. A contrast is an injection that improves the quality of the pictures from your heart.  A gel will be applied to your chest.  A wand-like tool (transducer) will be moved over your chest. The gel will help to transmit the sound waves from the transducer.  The sound waves will harmlessly bounce off of your heart to allow the heart images to be captured in real-time motion. The images will be recorded on a computer. The procedure may vary among  health care providers and hospitals. What happens after the procedure?  You may return to your normal, everyday life, including diet, activities, and medicines, unless your health care provider tells you not to do that. Summary  An echocardiogram is a procedure that uses painless sound waves (ultrasound) to produce an image of the heart.  Images  from an echocardiogram can provide important information about the size and shape of your heart, heart muscle function, heart valve function, and fluid buildup around your heart.  You do not need to do anything to prepare before this procedure. You may eat and drink normally.  After the echocardiogram is completed, you may return to your normal, everyday life, unless your health care provider tells you not to do that. This information is not intended to replace advice given to you by your health care provider. Make sure you discuss any questions you have with your health care provider. Document Released: 06/23/2000 Document Revised: 07/29/2016 Document Reviewed: 07/29/2016 Elsevier Interactive Patient Education  2019 [employer].

## 2018-11-04 NOTE — Telephone Encounter (Signed)
° °  Cardiac Questionnaire:    Since your last visit or hospitalization:    1. Have you been having new or worsening chest pain? no   2. Have you been having new or worsening shortness of breath? no 3. Have you been having new or worsening leg swelling, wt gain, or increase in abdominal girth (pants fitting more tightly)? yes   4. Have you had any passing out spells? no    *A YES to any of these questions would result in the appointment being kept. *If all the answers to these questions are NO, we should indicate that given the current situation regarding the worldwide coronarvirus pandemic, at the recommendation of the CDC, we are looking to limit gatherings in our waiting area, and thus will reschedule their appointment beyond four weeks from today.   _____________   COVID-19 Pre-Screening Questions:   Do you currently have a fever?no(yes = cancel and refer to pcp for e-visit)  Have you recently travelled on a cruise, internationally, or to [place], [place], [place], [place], [place], or [place], [place]) ? no (yes = cancel, stay home, monitor symptoms, and contact pcp or initiate e-visit if symptoms develop)  Have you been in contact with someone that is currently pending confirmation of Covid19 testing or has been confirmed to have the Covid19 virus?  no (yes = cancel, stay home, away from tested individual, monitor symptoms, and contact pcp or initiate e-visit if symptoms develop)  Are you currently experiencing fatigue or cough? no (yes = pt should be prepared to have a mask placed at the time of their visit).

## 2018-11-04 NOTE — Progress Notes (Signed)
Virtual Visit via Video Note   This visit type was conducted due to national recommendations for restrictions regarding the COVID-19 Pandemic (e.g. social distancing) in an effort to limit this patient's exposure and mitigate transmission in our community.  Due to her co-morbid illnesses, this patient is at least at moderate risk for complications without adequate follow up.  This format is felt to be most appropriate for this patient at this time.  All issues noted in this document were discussed and addressed.  A limited physical exam was performed with this format.  Please refer to the patient's chart for her consent to telehealth for [facility].  Evaluation Performed:  Follow-up visit  This visit type was conducted due to national recommendations for restrictions regarding the COVID-19 Pandemic (e.g. social distancing).  This format is felt to be most appropriate for this patient at this time.  All issues noted in this document were discussed and addressed.  No physical exam was performed (except for noted visual exam findings with Video Visits).  Please refer to the patient's chart (MyChart message for video visits and phone note for telephone visits) for the patient's consent to telehealth for [facility].  Date:  11/04/2018  ID: Katie Rasmussen, DOB 14-Jan-1971, MRN [id number]   Patient Location: [address] [place] [address]   Provider location:   [facility] [place] [facility]  PCP:  Paulina Fusi, MD  Cardiologist:  Gypsy Balsam, MD     Chief Complaint: Doing well  History of Present Illness:    Katie Rasmussen is a 48 y.o. female  who presents via audio/video conferencing for a telehealth visit today.  With history of cardiomyopathy with latest normalization.  Echocardiogram done in the fall of last year showed normal left ventricular ejection fraction.  Another problem is the fact that she got quite advanced COPD that required persistent usage of  steroids.  Also hyperlipidemia obesity coronary artery disease.  Overall she seems to be doing well she is set she has been busy but she is staying at home.  She repainted 3 in her room at home she also planted a lot of shops and flowers outside.  She seems to be doing well she is getting short of breath as usual but trying to take precaution against the potentially catching coronavirus.  She understand that she is a high risk.  Denies having any chest pain tightness squeezing pressure been chest still asymmetrical swelling of lower extremities as previously.  Weight seems to be fluctuating to 3 pounds but no increase overall.   The patient does not have symptoms concerning for COVID-19 infection (fever, chills, cough, or new SHORTNESS OF BREATH).    Prior CV studies:   The following studies were reviewed today:  Echocardiogram from 05/08/2018 showed: Study Conclusions  - Left ventricle: The cavity size was normal. Wall thickness was   increased in a pattern of mild LVH. Systolic function was normal.   The estimated ejection fraction was in the range of 55% to 60%.   Wall motion was normal; there were no regional wall motion   abnormalities. Left ventricular diastolic function parameters   were normal. - Aortic valve: There was trivial regurgitation. Valve area (VTI):   1.67 cm^2. Valve area (Vmax): 1.67 cm^2. Valve area (Vmean): 1.54   cm^2.     Past Medical History:  Diagnosis Date   Acute combined systolic and diastolic CHF, NYHA class 3 (HCC)    Bronchitis due to tobacco use 08/2017  Chronic respiratory failure (HCC)    COPD (chronic obstructive pulmonary disease) (HCC)    Coronary artery disease    Diastolic CHF (HCC)    Obesity hypoventilation syndrome (HCC)    Pneumonia 08/2017   Systolic CHF with reduced left ventricular function, NYHA class 3 (HCC) 09/13/2017    Past Surgical History:  Procedure Laterality Date   CESAREAN SECTION     CORONARY STENT  INTERVENTION N/A 09/18/2017   Procedure: CORONARY STENT INTERVENTION;  Surgeon: Tonny Bollmanooper, Michael, MD;  [facility];  Service: Cardiovascular;  Laterality: N/A;  prox LAD   LEFT HEART CATH AND CORONARY ANGIOGRAPHY N/A 09/18/2017   Procedure: LEFT HEART CATH AND CORONARY ANGIOGRAPHY;  Surgeon: Tonny Bollmanooper, Michael, MD;  [facility];  Service: Cardiovascular;  Laterality: N/A;   THYROID SURGERY       Current Meds  Medication Sig   albuterol (PROVENTIL HFA;VENTOLIN HFA) 108 (90 Base) MCG/ACT inhaler Inhale 2 puffs into the lungs every 4 (four) hours as needed for wheezing or shortness of breath.   ALPRAZolam (XANAX) 0.25 MG tablet Take 1 tablet by mouth 3 (three) times daily as needed.   aspirin EC 81 MG EC tablet Take 1 tablet (81 mg total) by mouth daily.   atorvastatin (LIPITOR) 80 MG tablet Take 1 tablet (80 mg total) by mouth daily at 6 PM.   clopidogrel (PLAVIX) 75 MG tablet TAKE 1 TABLET BY MOUTH DAILY WITH BREAKFAST.   fluticasone furoate-vilanterol (BREO ELLIPTA) 100-25 MCG/INH AEPB Inhale 1 puff into the lungs daily.   furosemide (LASIX) 40 MG tablet Take 40 mg by mouth daily as needed for edema.   ipratropium-albuterol (DUONEB) 0.5-2.5 (3) MG/3ML SOLN Take 3 mLs by nebulization every 6 (six) hours as needed (short of breath).    levothyroxine (SYNTHROID, LEVOTHROID) 125 MCG tablet Take 125 mcg by mouth daily before breakfast.    lisinopril (PRINIVIL,ZESTRIL) 2.5 MG tablet Take 1 tablet by mouth daily.   metoprolol succinate (TOPROL-XL) 25 MG 24 hr tablet Take 1 tablet (25 mg total) by mouth daily. Take with or immediately following a meal. (Patient taking differently: Take 12.5 mg by mouth daily. Take with or immediately following a meal.)   predniSONE (DELTASONE) 10 MG tablet Take 1 tablet by mouth daily with breakfast.       Family History: The patient's family history includes Diabetes in her father; Heart disease in her father and mother;  Hypertension in her mother.   ROS:   Please see the history of present illness.     All other systems reviewed and are negative.   Labs/Other Tests and Data Reviewed:     Recent Labs: 05/07/2018: TSH 0.066 09/04/2018: BUN 25; Creatinine, Ser 1.32; NT-Pro BNP 696; Potassium 4.2; Sodium 139  Recent Lipid Panel No results found for: CHOL, TRIG, HDL, CHOLHDL, VLDL, LDLCALC, LDLDIRECT    Exam:    Vital Signs:  BP (!) 158/80    Pulse 100    Ht 5\' 1"  (1.549 m)    Wt 242 lb (109.8 kg)    BMI 45.73 kg/m     Wt Readings from Last 3 Encounters:  11/04/18 242 lb (109.8 kg)  09/04/18 239 lb (108.4 kg)  07/19/18 238 lb 6.4 oz (108.1 kg)     Well nourished, well developed in no acute distress. Alert awake oriented x3.  We initiated conversation with video link however quality was poor therefore we end up finishing with just phone call.  She seems to be  doing well not in any distress no JVD no swelling of lower extremities.  Diagnosis for this visit:   1. Acute combined systolic and diastolic CHF, NYHA class 3 (HCC)   2. DCM (dilated cardiomyopathy) (HCC)   3. Ischemic cardiomyopathy   4. Coronary artery disease involving native coronary artery of native heart without angina pectoris   5. Bronchitis due to tobacco use   6. Dyslipidemia   7. Dyspnea on exertion      ASSESSMENT & PLAN:    1.  History of congestive heart failure.  Appears to be compensated for now.  Last estimation of left ventricular ejection fraction showed normal left ventricular ejection fraction.  Her primary care physician was thinking about switching her from lisinopril to [facility] if her ejection fraction is normal there is no indication for it.  I did review her echo from the hospital did not find any echocardiogram since we did it last time in October which showed preserved left ventricular ejection fraction.  I will schedule her to have another echocardiogram to check on it.  But in the meantime we will not  alter any of her medical therapy. 2.  Coronary artery disease stable from that point of view denies have any recent problems. 3.  Chronic bronchitis stable on steroids I recommended to follow-up with pulmonary. 4.  Dyslipidemia: On high intensity statin which I will continue for now. 5.  Dyspnea on exertion as usual no worsening.  COVID-19 Education: The signs and symptoms of COVID-19 were discussed with the patient and how to seek care for testing (follow up with PCP or arrange E-visit).  The importance of social distancing was discussed today.  Patient Risk:   After full review of this patients clinical status, I feel that they are at least moderate risk at this time.  Time:   Today, I have spent 17 minutes with the patient with telehealth technology discussing pt health issues.  I spent 5 minutes reviewing her chart before the visit.  Visit was finished at 11:29 AM.    Medication Adjustments/Labs and Tests Ordered: Current medicines are reviewed at length with the patient today.  Concerns regarding medicines are outlined above.  Orders Placed This Encounter  Procedures   ECHOCARDIOGRAM COMPLETE   Medication changes: No orders of the defined types were placed in this encounter.    Disposition: Follow-up in 3 months we will schedule to have echocardiogram with activity level 2  Signed, Georgeanna Lea, MD, [facility] 11/04/2018 1:00 PM    [facility]

## 2018-11-08 ENCOUNTER — Telehealth: Payer: Self-pay | Admitting: Cardiology

## 2018-11-08 MED ORDER — FUROSEMIDE 40 MG PO TABS: 60.0000 mg | ORAL_TABLET | Freq: Two times a day (BID) | ORAL | 1 refills | Status: DC

## 2018-11-08 NOTE — Telephone Encounter (Signed)
Called patient and she reports she has had increased swelling in both lower extremities since Monday. She is also having some shortness of breath and has a weight gain of 2 lbs. She is taking 40 mg lasix twice daily. She is still taking prednisone as well 10 mg daily. Will consult with Dr. Bing Matter and follow up with patient.

## 2018-11-08 NOTE — Telephone Encounter (Signed)
Patient informed to increase lasix to 60 mg twice daily. Patient verbally understood.

## 2018-11-08 NOTE — Telephone Encounter (Signed)
Patient called stating her lasix is not working very well and is wondering if she can be called in something stronger or an alternative. She is watching her diet very carefully also.

## 2018-11-08 NOTE — Telephone Encounter (Signed)
If she is not taking lasix every day, needs to start taking daily, if she takes it increase dose to 60 mg po qd  Will call her on MOnday

## 2018-11-11 MED ORDER — FUROSEMIDE 40 MG PO TABS: 40.0000 mg | ORAL_TABLET | Freq: Two times a day (BID) | ORAL | 1 refills | Status: DC

## 2018-11-11 NOTE — Telephone Encounter (Signed)
Patient understands Dr. Vanetta Shawl recommendations and verbally understands. Informed her to call us with any concerns.

## 2018-11-11 NOTE — Addendum Note (Signed)
Addended by: Lita Mains on: 11/11/2018 03:11 PM   Modules accepted: Orders

## 2018-11-11 NOTE — Telephone Encounter (Signed)
Called patient back she reports she felt really bad on Sunday she took 60 mg twice a day and she was dizzy and didn't feel good. So she is back to taking 40 mg twice daily. Dr. Janyce Llanos informed, he will advise her to continue taking lasix 40 mg twice daily and we will check back in a couple of days.

## 2018-11-13 NOTE — Telephone Encounter (Signed)
Left message for patient to return call.

## 2018-11-14 NOTE — Telephone Encounter (Signed)
Called patient again to check on her and she reports the swelling is better since staying on the 40 mg of lasix bid. However she still reports some issues with shortness of breath, she still has shortness of breath on exertion. She went to cardiac rehab once and informed me that she will not be going back because she wanted to spray the equipment down before using and the staff wouldn't allow this so she doesn't feel comfortable going back due to possible risk to being exposed to COVID 19. Will inform Dr. Bing Matter.

## 2018-12-04 ENCOUNTER — Telehealth (INDEPENDENT_AMBULATORY_CARE_PROVIDER_SITE_OTHER): Payer: BLUE CROSS/BLUE SHIELD | Admitting: Cardiology

## 2018-12-04 ENCOUNTER — Other Ambulatory Visit: Payer: Self-pay

## 2018-12-04 ENCOUNTER — Encounter: Payer: Self-pay | Admitting: Cardiology

## 2018-12-04 VITALS — BP 128/68 | Wt 251.0 lb

## 2018-12-04 DIAGNOSIS — R0600 Dyspnea, unspecified: Secondary | ICD-10-CM

## 2018-12-04 DIAGNOSIS — I5041 Acute combined systolic (congestive) and diastolic (congestive) heart failure: Secondary | ICD-10-CM

## 2018-12-04 DIAGNOSIS — R0609 Other forms of dyspnea: Secondary | ICD-10-CM

## 2018-12-04 DIAGNOSIS — E662 Morbid (severe) obesity with alveolar hypoventilation: Secondary | ICD-10-CM | POA: Diagnosis not present

## 2018-12-04 DIAGNOSIS — J410 Simple chronic bronchitis: Secondary | ICD-10-CM

## 2018-12-04 DIAGNOSIS — Z6841 Body Mass Index (BMI) 40.0 and over, adult: Secondary | ICD-10-CM

## 2018-12-04 DIAGNOSIS — I2510 Atherosclerotic heart disease of native coronary artery without angina pectoris: Secondary | ICD-10-CM

## 2018-12-04 DIAGNOSIS — I420 Dilated cardiomyopathy: Secondary | ICD-10-CM

## 2018-12-04 DIAGNOSIS — I255 Ischemic cardiomyopathy: Secondary | ICD-10-CM

## 2018-12-04 NOTE — Progress Notes (Signed)
Virtual Visit via Video Note   This visit type was conducted due to national recommendations for restrictions regarding the COVID-19 Pandemic (e.g. social distancing) in an effort to limit this patient's exposure and mitigate transmission in our community.  Due to her co-morbid illnesses, this patient is at least at moderate risk for complications without adequate follow up.  This format is felt to be most appropriate for this patient at this time.  All issues noted in this document were discussed and addressed.  A limited physical exam was performed with this format.  Please refer to the patient's chart for her consent to telehealth for [facility].  Evaluation Performed:  Follow-up visit  This visit type was conducted due to national recommendations for restrictions regarding the COVID-19 Pandemic (e.g. social distancing).  This format is felt to be most appropriate for this patient at this time.  All issues noted in this document were discussed and addressed.  No physical exam was performed (except for noted visual exam findings with Video Visits).  Please refer to the patient's chart (MyChart message for video visits and phone note for telephone visits) for the patient's consent to telehealth for [facility].  Date:  12/04/2018  ID: Katie Rasmussen, DOB 22-Oct-1970, MRN [id number]   Patient Location: [address] [place] [address]   Provider location:   [facility] [place] [facility]  PCP:  Paulina Fusi, MD  Cardiologist:  Gypsy Balsam, MD     Chief Complaint: I am having shortness of breath  History of Present Illness:    Katie Rasmussen is a 48 y.o. female  who presents via audio/video conferencing for a telehealth visit today.  With congestive heart failure also COPD coronary artery disease.  She actually is not doing well there is shortness of breath no significant increase in weight does also swelling of lower extremities.  By mistake she went to our office  today.  However we supposed to have a video visit some talking to her when she is sitting in our parking lot in the office no chest pain just shortness of breath proximal nocturnal dyspnea as well as some swelling of lower extremities clearly look like she does have decompensated congestive heart failure.  We decided to increase dose of furosemide in the afternoon we got a check Chem-7 as well as proBNP today and CBC.  Based on that we decide about potassium.  I anticipate need to give her some extra potassium.   The patient does not have symptoms concerning for COVID-19 infection (fever, chills, cough, or new SHORTNESS OF BREATH).    Prior CV studies:   The following studies were reviewed today:       Past Medical History:  Diagnosis Date  . Acute combined systolic and diastolic CHF, NYHA class 3 (HCC)   . Bronchitis due to tobacco use 08/2017  . Chronic respiratory failure (HCC)   . COPD (chronic obstructive pulmonary disease) (HCC)   . Coronary artery disease   . Diastolic CHF (HCC)   . Obesity hypoventilation syndrome (HCC)   . Pneumonia 08/2017  . Systolic CHF with reduced left ventricular function, NYHA class 3 (HCC) 09/13/2017    Past Surgical History:  Procedure Laterality Date  . CESAREAN SECTION    . CORONARY STENT INTERVENTION N/A 09/18/2017   Procedure: CORONARY STENT INTERVENTION;  Surgeon: Tonny Bollman, MD;  Location: [facility] INVASIVE CV LAB;  Service: Cardiovascular;  Laterality: N/A;  prox LAD  . LEFT HEART CATH AND CORONARY  ANGIOGRAPHY N/A 09/18/2017   Procedure: LEFT HEART CATH AND CORONARY ANGIOGRAPHY;  Surgeon: Tonny Bollmanooper, Michael, MD;  Location: [facility] INVASIVE CV LAB;  Service: Cardiovascular;  Laterality: N/A;  . THYROID SURGERY       Current Meds  Medication Sig  . albuterol (PROVENTIL HFA;VENTOLIN HFA) 108 (90 Base) MCG/ACT inhaler Inhale 2 puffs into the lungs every 4 (four) hours as needed for wheezing or shortness of breath.  . ALPRAZolam (XANAX) 0.25 MG  tablet Take 1 tablet by mouth 3 (three) times daily as needed.  Marland Kitchen. aspirin EC 81 MG EC tablet Take 1 tablet (81 mg total) by mouth daily.  Marland Kitchen. atorvastatin (LIPITOR) 80 MG tablet Take 1 tablet (80 mg total) by mouth daily at 6 PM.  . clopidogrel (PLAVIX) 75 MG tablet TAKE 1 TABLET BY MOUTH DAILY WITH BREAKFAST.  . fluticasone furoate-vilanterol (BREO ELLIPTA) 100-25 MCG/INH AEPB Inhale 1 puff into the lungs daily.  . furosemide (LASIX) 40 MG tablet Take 1 tablet (40 mg total) by mouth 2 (two) times daily.  Marland Kitchen. ipratropium-albuterol (DUONEB) 0.5-2.5 (3) MG/3ML SOLN Take 3 mLs by nebulization every 6 (six) hours as needed (short of breath).   Marland Kitchen. levothyroxine (SYNTHROID, LEVOTHROID) 125 MCG tablet Take 125 mcg by mouth daily before breakfast.   . lisinopril (PRINIVIL,ZESTRIL) 2.5 MG tablet Take 1 tablet by mouth daily.  . metoprolol succinate (TOPROL-XL) 25 MG 24 hr tablet Take 1 tablet (25 mg total) by mouth daily. Take with or immediately following a meal. (Patient taking differently: Take 12.5 mg by mouth daily. Take with or immediately following a meal.)  . predniSONE (DELTASONE) 10 MG tablet Take 1 tablet by mouth daily with breakfast.   . sacubitril-valsartan (ENTRESTO) 24-26 MG Take 1 tablet by mouth 2 (two) times daily.      Family History: The patient's family history includes Diabetes in her father; Heart disease in her father and mother; Hypertension in her mother.   ROS:   Please see the history of present illness.     All other systems reviewed and are negative.   Labs/Other Tests and Data Reviewed:     Recent Labs: 05/07/2018: TSH 0.066 09/04/2018: BUN 25; Creatinine, Ser 1.32; NT-Pro BNP 696; Potassium 4.2; Sodium 139  Recent Lipid Panel No results found for: CHOL, TRIG, HDL, CHOLHDL, VLDL, LDLCALC, LDLDIRECT    Exam:    Vital Signs:  BP 128/68   Wt 251 lb (113.9 kg)   BMI 47.43 kg/m     Wt Readings from Last 3 Encounters:  12/04/18 251 lb (113.9 kg)  11/04/18 242  lb (109.8 kg)  09/04/18 239 lb (108.4 kg)     Well nourished, well developed in no acute distress. Alert awake oriented x3 in mild respiratory distress.  I told her if she gets where she need to go to the emergency room  Diagnosis for this visit:   1. Acute combined systolic and diastolic CHF, NYHA class 3 (HCC)   2. DCM (dilated cardiomyopathy) (HCC)   3. Dyspnea on exertion   4. Obesity hypoventilation syndrome (HCC)      ASSESSMENT & PLAN:    1.  Acute systolic and diastolic congestive heart failure.  I seems to be decompensated we try to manage this as an outpatient however I told her clearly if she got some issue and situation get what she need to go to the emergency room.  She understands.  In the meantime we will try to manage this as an outpatient dose of diuretic  has been doubled.  Will check her Chem-7 and proBNP today and instruction regarding potassium will be given tomorrow. 2.  Dilated cardiomyopathy plan as outlined above. 3.  Dyspnea on exertion multifactorial clearly some decompensated congestive heart for playing a role here. 4.  Obesity noted.  COVID-19 Education: The signs and symptoms of COVID-19 were discussed with the patient and how to seek care for testing (follow up with PCP or arrange E-visit).  The importance of social distancing was discussed today.  Patient Risk:   After full review of this patients clinical status, I feel that they are at least moderate risk at this time.  Time:   Today, I have spent 20 minutes minutes with the patient with telehealth technology discussing pt health issues.  I spent 5 minutes reviewing her chart before the visit.  Visit was finished at 4:37 PM.    Medication Adjustments/Labs and Tests Ordered: Current medicines are reviewed at length with the patient today.  Concerns regarding medicines are outlined above.  No orders of the defined types were placed in this encounter.  Medication changes: No orders of the defined  types were placed in this encounter.    Disposition: She will do does have appointment with me next week.  Signed, Georgeanna Lea, MD, [facility] 12/04/2018 4:39 PM    [facility]

## 2018-12-04 NOTE — Patient Instructions (Signed)
Medication Instructions:  Your physician has recommended you make the following change in your medication:   Increase lasix to: 80 mg am and 80 mg pm daily   If you need a refill on your cardiac medications before your next appointment, please call your pharmacy.   Lab work: Your physician recommends that you return for lab work today: CBC, bmp, Pro bnp   Your physician recommends that you return for lab work in:  If you have labs (blood work) drawn today and your tests are completely normal, you will receive your results only by: Marland Kitchen MyChart Message (if you have MyChart) OR . A paper copy in the mail If you have any lab test that is abnormal or we need to change your treatment, we will call you to review the results.  Testing/Procedures: None.   Follow-Up: Follow up next week as planned.   Any Other Special Instructions Will Be Listed Below (If Applicable).

## 2018-12-05 ENCOUNTER — Telehealth: Payer: Self-pay | Admitting: *Deleted

## 2018-12-05 LAB — CBC
Hematocrit: 32.5 % — ABNORMAL LOW (ref 34.0–46.6)
Hemoglobin: 11.1 g/dL (ref 11.1–15.9)
MCH: 29.4 pg (ref 26.6–33.0)
MCHC: 34.2 g/dL (ref 31.5–35.7)
MCV: 86 fL (ref 79–97)
Platelets: 332 10*3/uL (ref 150–450)
RBC: 3.78 x10E6/uL (ref 3.77–5.28)
RDW: 15.2 % (ref 11.7–15.4)
WBC: 17.8 10*3/uL — ABNORMAL HIGH (ref 3.4–10.8)

## 2018-12-05 LAB — BASIC METABOLIC PANEL
BUN/Creatinine Ratio: 18 (ref 9–23)
BUN: 28 mg/dL — ABNORMAL HIGH (ref 6–24)
CO2: 19 mmol/L — ABNORMAL LOW (ref 20–29)
Calcium: 9.3 mg/dL (ref 8.7–10.2)
Chloride: 100 mmol/L (ref 96–106)
Creatinine, Ser: 1.55 mg/dL — ABNORMAL HIGH (ref 0.57–1.00)
GFR calc Af Amer: 45 mL/min/{1.73_m2} — ABNORMAL LOW (ref >59)
GFR calc non Af Amer: 39 mL/min/{1.73_m2} — ABNORMAL LOW (ref >59)
Glucose: 156 mg/dL — ABNORMAL HIGH (ref 65–99)
Potassium: 4.8 mmol/L (ref 3.5–5.2)
Sodium: 137 mmol/L (ref 134–144)

## 2018-12-05 LAB — PRO B NATRIURETIC PEPTIDE: NT-Pro BNP: 414 pg/mL — ABNORMAL HIGH (ref 0–249)

## 2018-12-05 NOTE — Telephone Encounter (Signed)
Pt called wanting results of blood work done yesterday. Please advise.

## 2018-12-05 NOTE — Telephone Encounter (Signed)
Informed patient of lab results advised her to go back to lasix 40 mg twice daily stating tomorrow. Informed her that her wbc count was elevated, copied this to her pcp and advised her to follow up with them. She verbally understood. No further questions.

## 2018-12-09 ENCOUNTER — Other Ambulatory Visit: Payer: Self-pay

## 2018-12-09 ENCOUNTER — Ambulatory Visit (INDEPENDENT_AMBULATORY_CARE_PROVIDER_SITE_OTHER): Payer: BLUE CROSS/BLUE SHIELD | Admitting: Cardiology

## 2018-12-09 ENCOUNTER — Ambulatory Visit: Payer: BLUE CROSS/BLUE SHIELD | Admitting: Cardiology

## 2018-12-09 ENCOUNTER — Ambulatory Visit (INDEPENDENT_AMBULATORY_CARE_PROVIDER_SITE_OTHER): Payer: BLUE CROSS/BLUE SHIELD

## 2018-12-09 VITALS — BP 110/60 | HR 90

## 2018-12-09 DIAGNOSIS — R0609 Other forms of dyspnea: Secondary | ICD-10-CM

## 2018-12-09 DIAGNOSIS — I420 Dilated cardiomyopathy: Secondary | ICD-10-CM | POA: Diagnosis not present

## 2018-12-09 DIAGNOSIS — I2510 Atherosclerotic heart disease of native coronary artery without angina pectoris: Secondary | ICD-10-CM | POA: Diagnosis not present

## 2018-12-09 DIAGNOSIS — E785 Hyperlipidemia, unspecified: Secondary | ICD-10-CM

## 2018-12-09 DIAGNOSIS — M7989 Other specified soft tissue disorders: Secondary | ICD-10-CM | POA: Diagnosis not present

## 2018-12-09 DIAGNOSIS — R0600 Dyspnea, unspecified: Secondary | ICD-10-CM

## 2018-12-09 NOTE — Addendum Note (Signed)
Addended by: Lita Mains on: 12/09/2018 10:55 AM   Modules accepted: Orders

## 2018-12-09 NOTE — Progress Notes (Signed)
Cardiology Office Note:    Date:  12/09/2018   ID:  Katie Rasmussen, DOB June 06, 1971, MRN [id number]  PCP:  Paulina Fusi, MD  Cardiologist:  Gypsy Balsam, MD    Referring MD: Paulina Fusi, MD   Chief Complaint  Patient presents with  . 3 day follow up  Doing better but still short of breath  History of Present Illness:    Katie Rasmussen is a 48 y.o. female with quite complex past medical history which include coronary artery disease, history of cardiomyopathy with latest ejection fraction being normal.,  COPD which is advanced.  She called me last week complaining of having some shortness of breath we did quite extensive evaluation which included proBNP which was actually quite low.  She eventually end up going to her primary care physician x-ray was done which showed pneumonia and she is being managed with antibiotics described to feel slightly better.  What is worrisome today is the fact that her left lower extremity is significantly much more swollen than the right one.  Past Medical History:  Diagnosis Date  . Acute combined systolic and diastolic CHF, NYHA class 3 (HCC)   . Bronchitis due to tobacco use 08/2017  . Chronic respiratory failure (HCC)   . COPD (chronic obstructive pulmonary disease) (HCC)   . Coronary artery disease   . Diastolic CHF (HCC)   . Obesity hypoventilation syndrome (HCC)   . Pneumonia 08/2017  . Systolic CHF with reduced left ventricular function, NYHA class 3 (HCC) 09/13/2017    Past Surgical History:  Procedure Laterality Date  . CESAREAN SECTION    . CORONARY STENT INTERVENTION N/A 09/18/2017   Procedure: CORONARY STENT INTERVENTION;  Surgeon: Tonny Bollman, MD;  Location: [facility] INVASIVE CV LAB;  Service: Cardiovascular;  Laterality: N/A;  prox LAD  . LEFT HEART CATH AND CORONARY ANGIOGRAPHY N/A 09/18/2017   Procedure: LEFT HEART CATH AND CORONARY ANGIOGRAPHY;  Surgeon: Tonny Bollman, MD;  Location: [facility] INVASIVE CV LAB;  Service:  Cardiovascular;  Laterality: N/A;  . THYROID SURGERY      Current Medications: Current Meds  Medication Sig  . albuterol (PROVENTIL HFA;VENTOLIN HFA) 108 (90 Base) MCG/ACT inhaler Inhale 2 puffs into the lungs every 4 (four) hours as needed for wheezing or shortness of breath.  . ALPRAZolam (XANAX) 0.25 MG tablet Take 1 tablet by mouth 3 (three) times daily as needed.  Marland Kitchen aspirin EC 81 MG EC tablet Take 1 tablet (81 mg total) by mouth daily.  Marland Kitchen atorvastatin (LIPITOR) 80 MG tablet Take 1 tablet (80 mg total) by mouth daily at 6 PM.  . clopidogrel (PLAVIX) 75 MG tablet TAKE 1 TABLET BY MOUTH DAILY WITH BREAKFAST.  . fluticasone furoate-vilanterol (BREO ELLIPTA) 100-25 MCG/INH AEPB Inhale 1 puff into the lungs daily.  . furosemide (LASIX) 40 MG tablet Take 1 tablet (40 mg total) by mouth 2 (two) times daily.  Marland Kitchen ipratropium-albuterol (DUONEB) 0.5-2.5 (3) MG/3ML SOLN Take 3 mLs by nebulization every 6 (six) hours as needed (short of breath).   Marland Kitchen levothyroxine (SYNTHROID, LEVOTHROID) 125 MCG tablet Take 125 mcg by mouth daily before breakfast.   . lisinopril (PRINIVIL,ZESTRIL) 2.5 MG tablet Take 1 tablet by mouth daily.  . metoprolol succinate (TOPROL-XL) 25 MG 24 hr tablet Take 1 tablet (25 mg total) by mouth daily. Take with or immediately following a meal. (Patient taking differently: Take 12.5 mg by mouth daily. Take with or immediately following a meal.)  . predniSONE (DELTASONE) 10 MG tablet Take 1  tablet by mouth daily with breakfast.   . sacubitril-valsartan (ENTRESTO) 24-26 MG Take 1 tablet by mouth 2 (two) times daily.     Allergies:   Iodine; Lovenox [enoxaparin]; and Shellfish allergy   Social History   Socioeconomic History  . Marital status: Married    Spouse name: Not on file  . Number of children: 3  . Years of education: Not on file  . Highest education level: Not on file  Occupational History  . 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  Financial resource strain: Not on file  . Food insecurity:    Worry: Not on file    Inability: Not on file  . Transportation needs:    Medical: Not on file    Non-medical: Not on file  Tobacco Use  . Smoking status: Former Smoker    Packs/day: 0.50    Years: 14.00    Pack years: 7.00    Types: Cigarettes    Last attempt to quit: 09/12/2017    Years since quitting: 1.2  . Smokeless tobacco: Never Used  Substance and Sexual Activity  . Alcohol use: No    Frequency: Never  . Drug use: No  . Sexual activity: Not on file  Lifestyle  . Physical activity:    Days per week: Not on file    Minutes per session: Not on file  . Stress: Very much  Relationships  . Social connections:    Talks on phone: Not on file    Gets together: Not on file    Attends religious service: Not on file    Active member of club or organization: Not on file    Attends meetings of clubs or organizations: Not on file    Relationship status: Not on file  Other Topics Concern  . Not on file  Social History Narrative  . Not on file     Family History: The patient's family history includes Diabetes in her father; Heart disease in her father and mother; Hypertension in her mother. ROS:   Please see the history of present illness.    All 14 point review of systems negative except as described per history of present illness  EKGs/Labs/Other Studies Reviewed:      Recent Labs: 05/07/2018: TSH 0.066 12/04/2018: BUN 28; Creatinine, Ser 1.55; Hemoglobin 11.1; NT-Pro BNP 414; Platelets 332; Potassium 4.8; Sodium 137  Recent Lipid Panel No results found for: CHOL, TRIG, HDL, CHOLHDL, VLDL, LDLCALC, LDLDIRECT  Physical Exam:    VS:  BP 110/60   Pulse 90   SpO2 98%     Wt Readings from Last 3 Encounters:  12/04/18 251 lb (113.9 kg)  11/04/18 242 lb (109.8 kg)  09/04/18 239 lb (108.4 kg)     GEN:  Well nourished, well developed in no acute distress HEENT: Normal NECK: No JVD; No carotid bruits  LYMPHATICS: No lymphadenopathy CARDIAC: RRR, no murmurs, no rubs, no gallops RESPIRATORY:  Clear to auscultation without rales, wheezing or rhonchi  ABDOMEN: Soft, non-tender, non-distended MUSCULOSKELETAL:  No edema; No deformity  SKIN: Warm and dry LOWER EXTREMITIES: no swelling NEUROLOGIC:  Alert and oriented x 3 PSYCHIATRIC:  Normal affect   ASSESSMENT:    1. Dyspnea on exertion   2. DCM (dilated cardiomyopathy) (HCC)   3. Coronary artery disease involving native coronary artery of native heart without angina pectoris   4. Dyslipidemia    PLAN:    In order of problems listed above:  1. Dyspnea on exertion multifactorial COPD, pneumonia, congestive heart failure play some role here.  All managed appropriately with some limited success so far obviously concern right now is swelling of lower extremities which is asymmetrical we will do a DVT study 2. Dilated cardiomyopathy on appropriate medication last echocardiogram showed preserved ejection fraction new echo is pending 3. Coronary disease doing well from that point review denies having an issue lately 4. Dyslipidemia on appropriate medications.   Medication Adjustments/Labs and Tests Ordered: Current medicines are reviewed at length with the patient today.  Concerns regarding medicines are outlined above.  No orders of the defined types were placed in this encounter.  Medication changes: No orders of the defined types were placed in this encounter.   Signed, Georgeanna Lea, MD, [facility] 12/09/2018 10:42 AM    [facility]

## 2018-12-09 NOTE — Patient Instructions (Signed)
Medication Instructions:  Your physician recommends that you continue on your current medications as directed. Please refer to the Current Medication list given to you today.  If you need a refill on your cardiac medications before your next appointment, please call your pharmacy.   Lab work: None.  If you have labs (blood work) drawn today and your tests are completely normal, you will receive your results only by: Marland Kitchen MyChart Message (if you have MyChart) OR . A paper copy in the mail If you have any lab test that is abnormal or we need to change your treatment, we will call you to review the results.  Testing/Procedures: Your physician has requested that you have a lower extremity arterial exercise duplex. During this test, exercise and ultrasound are used to evaluate arterial blood flow in the legs. Allow one hour for this exam. There are no restrictions or special instructions.  Follow-Up: At [facility], you and your health needs are our priority.  As part of our continuing mission to provide you with exceptional heart care, we have created designated Provider Care Teams.  These Care Teams include your primary Cardiologist (physician) and Advanced Practice Providers (APPs -  Physician Assistants and Nurse Practitioners) who all work together to provide you with the care you need, when you need it. You will need a follow up appointment in 3 weeks.  Please call our office 2 months in advance to schedule this appointment.  You may see Gypsy Balsam, MD or another member of our [facility] Provider Team in [place]: Norman Herrlich, MD . Belva Crome, MD  Any Other Special Instructions Will Be Listed Below (If Applicable).

## 2018-12-09 NOTE — Progress Notes (Signed)
Lower extremity venous duplex     Right: There is no evidence of deep vein thrombosis in the lower extremity. No cystic structure found in the popliteal fossa.  Left: There is no evidence of deep vein thrombosis in the lower extremity. No cystic structure found in the popliteal fossa.      12/09/18 Katie Rasmussen

## 2018-12-19 ENCOUNTER — Other Ambulatory Visit: Payer: Self-pay | Admitting: Cardiology

## 2018-12-19 MED ORDER — METOPROLOL SUCCINATE ER 25 MG PO TB24: 12.5000 mg | ORAL_TABLET | Freq: Every day | ORAL | 0 refills | Status: DC

## 2018-12-19 NOTE — Telephone Encounter (Signed)
Metoprolol succinate 12.5 mg daily refilled.  

## 2018-12-19 NOTE — Telephone Encounter (Signed)
°*  STAT* If patient is at the pharmacy, call can be transferred to refill team.   1. Which medications need to be refilled? (please list name of each medication and dose if known) Metoprolol siccinate 25mg  24hr tablet  2. Which pharmacy/location (including street and city if local pharmacy) is medication to be sent to? [facility]  3. Do they need a 30 day or 90 day supply? [place]

## 2018-12-30 ENCOUNTER — Encounter: Payer: Self-pay | Admitting: Cardiology

## 2018-12-30 ENCOUNTER — Other Ambulatory Visit: Payer: Self-pay

## 2018-12-30 ENCOUNTER — Telehealth (INDEPENDENT_AMBULATORY_CARE_PROVIDER_SITE_OTHER): Payer: BLUE CROSS/BLUE SHIELD | Admitting: Cardiology

## 2018-12-30 ENCOUNTER — Telehealth: Payer: Self-pay | Admitting: Emergency Medicine

## 2018-12-30 DIAGNOSIS — J410 Simple chronic bronchitis: Secondary | ICD-10-CM

## 2018-12-30 DIAGNOSIS — R0609 Other forms of dyspnea: Secondary | ICD-10-CM

## 2018-12-30 DIAGNOSIS — E785 Hyperlipidemia, unspecified: Secondary | ICD-10-CM

## 2018-12-30 DIAGNOSIS — I420 Dilated cardiomyopathy: Secondary | ICD-10-CM | POA: Diagnosis not present

## 2018-12-30 DIAGNOSIS — R0600 Dyspnea, unspecified: Secondary | ICD-10-CM

## 2018-12-30 DIAGNOSIS — E662 Morbid (severe) obesity with alveolar hypoventilation: Secondary | ICD-10-CM

## 2018-12-30 NOTE — Progress Notes (Signed)
Virtual Visit via Telephone Note   This visit type was conducted due to national recommendations for restrictions regarding the COVID-19 Pandemic (e.g. social distancing) in an effort to limit this patient's exposure and mitigate transmission in our community.  Due to her co-morbid illnesses, this patient is at least at moderate risk for complications without adequate follow up.  This format is felt to be most appropriate for this patient at this time.  The patient did not have access to video technology/had technical difficulties with video requiring transitioning to audio format only (telephone).  All issues noted in this document were discussed and addressed.  No physical exam could be performed with this format.  Please refer to the patient's chart for her  consent to telehealth for [facility].  Evaluation Performed:  Follow-up visit  This visit type was conducted due to national recommendations for restrictions regarding the COVID-19 Pandemic (e.g. social distancing).  This format is felt to be most appropriate for this patient at this time.  All issues noted in this document were discussed and addressed.  No physical exam was performed (except for noted visual exam findings with Video Visits).  Please refer to the patient's chart (MyChart message for video visits and phone note for telephone visits) for the patient's consent to telehealth for [facility].  Date:  12/30/2018  ID: Katie Rasmussen, DOB 07/17/1970, MRN [id number]   Patient Location: [address] [place] [address]   Provider location:   [facility] [place] [facility]  PCP:  Paulina Fusi, MD  Cardiologist:  Gypsy Balsam, MD     Chief Complaint: My breathing is better but my legs are swollen  History of Present Illness:    Katie Rasmussen is a 48 y.o. female  who presents via audio/video conferencing for a telehealth visit today.  History of congestive heart failure, also COPD, evidence of cor  pulmonale.  She is being follow-up with me for her congestive heart failure.  Recently she got pneumonia treated with antibiotic and steroids things improved in terms of respiratory however swelling of lower extremities bothering her tremendously.  I asked to temporarily increase dose of furosemide from 40-80 twice daily we will check her kidney function on Wednesday when I brought her back to my office.  I need to look at her and see what her weight is.   The patient does not have symptoms concerning for COVID-19 infection (fever, chills, cough, or new SHORTNESS OF BREATH).    Prior CV studies:   The following studies were reviewed today:       Past Medical History:  Diagnosis Date   Acute combined systolic and diastolic CHF, NYHA class 3 (HCC)    Bronchitis due to tobacco use 08/2017   Chronic respiratory failure (HCC)    COPD (chronic obstructive pulmonary disease) (HCC)    Coronary artery disease    Diastolic CHF (HCC)    Obesity hypoventilation syndrome (HCC)    Pneumonia 08/2017   Systolic CHF with reduced left ventricular function, NYHA class 3 (HCC) 09/13/2017    Past Surgical History:  Procedure Laterality Date   CESAREAN SECTION     CORONARY STENT INTERVENTION N/A 09/18/2017   Procedure: CORONARY STENT INTERVENTION;  Surgeon: Tonny Bollman, MD;  Location: [facility] INVASIVE CV LAB;  Service: Cardiovascular;  Laterality: N/A;  prox LAD   LEFT HEART CATH AND CORONARY ANGIOGRAPHY N/A 09/18/2017   Procedure: LEFT HEART CATH AND CORONARY ANGIOGRAPHY;  Surgeon: Tonny Bollman, MD;  Location: [facility]  INVASIVE CV LAB;  Service: Cardiovascular;  Laterality: N/A;   THYROID SURGERY       Current Meds  Medication Sig   albuterol (PROVENTIL HFA;VENTOLIN HFA) 108 (90 Base) MCG/ACT inhaler Inhale 2 puffs into the lungs every 4 (four) hours as needed for wheezing or shortness of breath.   ALPRAZolam (XANAX) 0.25 MG tablet Take 1 tablet by mouth 3 (three) times daily as needed.     aspirin EC 81 MG EC tablet Take 1 tablet (81 mg total) by mouth daily.   atorvastatin (LIPITOR) 80 MG tablet Take 1 tablet (80 mg total) by mouth daily at 6 PM.   clopidogrel (PLAVIX) 75 MG tablet TAKE 1 TABLET BY MOUTH DAILY WITH BREAKFAST.   fluticasone furoate-vilanterol (BREO ELLIPTA) 100-25 MCG/INH AEPB Inhale 1 puff into the lungs daily.   furosemide (LASIX) 40 MG tablet Take 1 tablet (40 mg total) by mouth 2 (two) times daily.   ipratropium-albuterol (DUONEB) 0.5-2.5 (3) MG/3ML SOLN Take 3 mLs by nebulization every 6 (six) hours as needed (short of breath).    levothyroxine (SYNTHROID, LEVOTHROID) 125 MCG tablet Take 125 mcg by mouth daily before breakfast.    lisinopril (PRINIVIL,ZESTRIL) 2.5 MG tablet Take 1 tablet by mouth daily.   metoprolol succinate (TOPROL-XL) 25 MG 24 hr tablet Take 0.5 tablets (12.5 mg total) by mouth daily. Take with or immediately following a meal.   predniSONE (DELTASONE) 10 MG tablet Take 1 tablet by mouth daily with breakfast.    sacubitril-valsartan (ENTRESTO) 24-26 MG Take 1 tablet by mouth 2 (two) times daily.      Family History: The patient's family history includes Diabetes in her father; Heart disease in her father and mother; Hypertension in her mother.   ROS:   Please see the history of present illness.     All other systems reviewed and are negative.   Labs/Other Tests and Data Reviewed:     Recent Labs: 05/07/2018: TSH 0.066 12/04/2018: BUN 28; Creatinine, Ser 1.55; Hemoglobin 11.1; NT-Pro BNP 414; Platelets 332; Potassium 4.8; Sodium 137  Recent Lipid Panel No results found for: CHOL, TRIG, HDL, CHOLHDL, VLDL, LDLCALC, LDLDIRECT    Exam:    Vital Signs:  There were no vitals taken for this visit.    Wt Readings from Last 3 Encounters:  12/04/18 251 lb (113.9 kg)  11/04/18 242 lb (109.8 kg)  09/04/18 239 lb (108.4 kg)     Well nourished, well developed in no acute distress. Alert awake oriented x3 not in  distress talking to me over the phone.  She got no technical ability to establish video link  Diagnosis for this visit:   1. DCM (dilated cardiomyopathy) (HCC)   2. Obesity hypoventilation syndrome (HCC)   3. Simple chronic bronchitis (HCC)   4. Dyslipidemia   5. Dyspnea on exertion      ASSESSMENT & PLAN:    1.  Dilated cardiomyopathy last estimation of ejection fraction was normal.  Another echocardiogram is scheduled to reassess this.  In the meantime we will continue all medications 2.  COPD.  Followed by internal medicine team she does have appointment with primary care physician next week 3.  Dyslipidemia continue with medications  I will temporarily increase diuretic I will bring her back on Wednesday to have her kidney function checked  COVID-19 Education: The signs and symptoms of COVID-19 were discussed with the patient and how to seek care for testing (follow up with PCP or arrange E-visit).  The importance of social  distancing was discussed today.  Patient Risk:   After full review of this patients clinical status, I feel that they are at least moderate risk at this time.  Time:   Today, I have spent 15 minutes with the patient with telehealth technology discussing pt health issues.  I spent 5 minutes reviewing her chart before the visit.  Visit was finished at 11 AM.    Medication Adjustments/Labs and Tests Ordered: Current medicines are reviewed at length with the patient today.  Concerns regarding medicines are outlined above.  No orders of the defined types were placed in this encounter.  Medication changes: No orders of the defined types were placed in this encounter.    Disposition: Follow-up in 2 days  Signed, Park Liter, MD, [facility] 12/30/2018 10:58 AM    [place]

## 2018-12-30 NOTE — Patient Instructions (Signed)
Medication Instructions:  Your physician has recommended you make the following change in your medication:    Increase: Lasix to 80 mg daily   If you need a refill on your cardiac medications before your next appointment, please call your pharmacy.   Lab work: None.  If you have labs (blood work) drawn today and your tests are completely normal, you will receive your results only by: Marland Kitchen MyChart Message (if you have MyChart) OR . A paper copy in the mail If you have any lab test that is abnormal or we need to change your treatment, we will call you to review the results.  Testing/Procedures: None.   Follow-Up:  Follow up with Dr. Agustin Cree in 2 days in the [place] office,   Any Other Special Instructions Will Be Listed Below (If Applicable).

## 2018-12-30 NOTE — Telephone Encounter (Signed)
Left message for patient to return call to go over discharge instructions and medication management.

## 2019-01-01 ENCOUNTER — Ambulatory Visit (INDEPENDENT_AMBULATORY_CARE_PROVIDER_SITE_OTHER): Payer: BLUE CROSS/BLUE SHIELD | Admitting: Cardiology

## 2019-01-01 ENCOUNTER — Other Ambulatory Visit: Payer: Self-pay

## 2019-01-01 VITALS — BP 124/70 | HR 102 | Wt 258.0 lb

## 2019-01-01 DIAGNOSIS — J410 Simple chronic bronchitis: Secondary | ICD-10-CM

## 2019-01-01 DIAGNOSIS — E662 Morbid (severe) obesity with alveolar hypoventilation: Secondary | ICD-10-CM

## 2019-01-01 DIAGNOSIS — I255 Ischemic cardiomyopathy: Secondary | ICD-10-CM

## 2019-01-01 DIAGNOSIS — R0600 Dyspnea, unspecified: Secondary | ICD-10-CM

## 2019-01-01 DIAGNOSIS — R0609 Other forms of dyspnea: Secondary | ICD-10-CM | POA: Diagnosis not present

## 2019-01-01 DIAGNOSIS — I2510 Atherosclerotic heart disease of native coronary artery without angina pectoris: Secondary | ICD-10-CM

## 2019-01-01 DIAGNOSIS — E785 Hyperlipidemia, unspecified: Secondary | ICD-10-CM | POA: Diagnosis not present

## 2019-01-01 NOTE — Patient Instructions (Signed)
Medication Instructions:  Your physician recommends that you continue on your current medications as directed. Please refer to the Current Medication list given to you today.  If you need a refill on your cardiac medications before your next appointment, please call your pharmacy.   Lab work: Your physician recommends that you return for lab work today: bmp , pro bnp   If you have labs (blood work) drawn today and your tests are completely normal, you will receive your results only by: Marland Kitchen MyChart Message (if you have MyChart) OR . A paper copy in the mail If you have any lab test that is abnormal or we need to change your treatment, we will call you to review the results.  Testing/Procedures: None.   Follow-Up: At [facility], you and your health needs are our priority.  As part of our continuing mission to provide you with exceptional heart care, we have created designated Provider Care Teams.  These Care Teams include your primary Cardiologist (physician) and Advanced Practice Providers (APPs -  Physician Assistants and Nurse Practitioners) who all work together to provide you with the care you need, when you need it. You will need a follow up appointment in 1 months.  Please call our office 2 months in advance to schedule this appointment.  You may see Jenne Campus, MD or another member of our [place] Provider Team in [place]: Shirlee More, MD . Jyl Heinz, MD  Any Other Special Instructions Will Be Listed Below (If Applicable).

## 2019-01-01 NOTE — Progress Notes (Signed)
Cardiology Office Note:    Date:  01/01/2019   ID:  Kyran Whittier, DOB 03-07-1971, MRN [id number]  PCP:  Nicoletta Dress, MD  Cardiologist:  Jenne Campus, MD    Referring MD: Nicoletta Dress, MD   Chief Complaint  Patient presents with  . 2 day follow up  Doing better  History of Present Illness:    Katie Rasmussen is a 48 y.o. female with history of cardiomyopathy with diminished ejection fraction however latest echocardiogram showed preserved left ventricular ejection fraction, she does have diastolic dysfunction also advanced COPD required chronic steroids.  I think seeing her quite frequently trying to manage her fluid as well as a can.  She is looks good today meaning her breathing is good she does not have any wheezes still swelling of lower extremities is present 1+ on both sides.  She is taking diuretic on a regular basis however yesterday she became dizzy lightheaded when she was trying to stand up.  She took some extra fluids and extra salty food and things got better.  Denies have any chest pain tightness squeezing pressure burning chest.  She does have chronic back problem.  Past Medical History:  Diagnosis Date  . Acute combined systolic and diastolic CHF, NYHA class 3 ([place])   . Bronchitis due to tobacco use 08/2017  . Chronic respiratory failure ([place])   . COPD (chronic obstructive pulmonary disease) ([place])   . Coronary artery disease   . Diastolic CHF ([place])   . Obesity hypoventilation syndrome ([place])   . Pneumonia 08/2017  . Systolic CHF with reduced left ventricular function, NYHA class 3 ([place]) 09/13/2017    Past Surgical History:  Procedure Laterality Date  . CESAREAN SECTION    . CORONARY STENT INTERVENTION N/A 09/18/2017   Procedure: CORONARY STENT INTERVENTION;  Surgeon: Sherren Mocha, MD;  Location: [place] CV LAB;  Service: Cardiovascular;  Laterality: N/A;  prox LAD  . LEFT HEART CATH AND CORONARY ANGIOGRAPHY N/A 09/18/2017   Procedure: LEFT  HEART CATH AND CORONARY ANGIOGRAPHY;  Surgeon: Sherren Mocha, MD;  Location: [place] CV LAB;  Service: Cardiovascular;  Laterality: N/A;  . THYROID SURGERY      Current Medications: Current Meds  Medication Sig  . albuterol (PROVENTIL HFA;VENTOLIN HFA) 108 (90 Base) MCG/ACT inhaler Inhale 2 puffs into the lungs every 4 (four) hours as needed for wheezing or shortness of breath.  . ALPRAZolam (XANAX) 0.25 MG tablet Take 1 tablet by mouth 3 (three) times daily as needed.  Marland Kitchen aspirin EC 81 MG EC tablet Take 1 tablet (81 mg total) by mouth daily.  Marland Kitchen atorvastatin (LIPITOR) 80 MG tablet Take 1 tablet (80 mg total) by mouth daily at 6 PM.  . clopidogrel (PLAVIX) 75 MG tablet TAKE 1 TABLET BY MOUTH DAILY WITH BREAKFAST.  . fluticasone furoate-vilanterol (BREO ELLIPTA) 100-25 MCG/INH AEPB Inhale 1 puff into the lungs daily.  . furosemide (LASIX) 40 MG tablet Take 1 tablet (40 mg total) by mouth 2 (two) times daily.  Marland Kitchen ipratropium-albuterol (DUONEB) 0.5-2.5 (3) MG/3ML SOLN Take 3 mLs by nebulization every 6 (six) hours as needed (short of breath).   Marland Kitchen levothyroxine (SYNTHROID, LEVOTHROID) 125 MCG tablet Take 125 mcg by mouth daily before breakfast.   . lisinopril (PRINIVIL,ZESTRIL) 2.5 MG tablet Take 1 tablet by mouth daily.  . metoprolol succinate (TOPROL-XL) 25 MG 24 hr tablet Take 0.5 tablets (12.5 mg total) by mouth daily. Take with or immediately following a meal.  . predniSONE (DELTASONE) 10  MG tablet Take 1 tablet by mouth daily with breakfast.      Allergies:   Iodine, Lovenox [enoxaparin], and Shellfish allergy   Social History   Socioeconomic History  . Marital status: Married    Spouse name: Not on file  . Number of children: 3  . Years of education: Not on file  . Highest education level: Not on file  Occupational History  . 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 Financial resource strain: Not on file  . Food insecurity    Worry: Not on file     Inability: Not on file  . Transportation needs    Medical: Not on file    Non-medical: Not on file  Tobacco Use  . Smoking status: Former Smoker    Packs/day: 0.50    Years: 14.00    Pack years: 7.00    Types: Cigarettes    Quit date: 09/12/2017    Years since quitting: 1.3  . Smokeless tobacco: Never Used  Substance and Sexual Activity  . Alcohol use: No    Frequency: Never  . Drug use: No  . Sexual activity: Not on file  Lifestyle  . Physical activity    Days per week: Not on file    Minutes per session: Not on file  . Stress: Very much  Relationships  . Social Musician on phone: Not on file    Gets together: Not on file    Attends religious service: Not on file    Active member of club or organization: Not on file    Attends meetings of clubs or organizations: Not on file    Relationship status: Not on file  Other Topics Concern  . Not on file  Social History Narrative  . Not on file     Family History: The patient's family history includes Diabetes in her father; Heart disease in her father and mother; Hypertension in her mother. ROS:   Please see the history of present illness.    All 14 point review of systems negative except as described per history of present illness  EKGs/Labs/Other Studies Reviewed:      Recent Labs: 05/07/2018: TSH 0.066 12/04/2018: BUN 28; Creatinine, Ser 1.55; Hemoglobin 11.1; NT-Pro BNP 414; Platelets 332; Potassium 4.8; Sodium 137  Recent Lipid Panel No results found for: CHOL, TRIG, HDL, CHOLHDL, VLDL, LDLCALC, LDLDIRECT  Physical Exam:    VS:  BP 124/70   Pulse (!) 102   Wt 258 lb (117 kg)   SpO2 100%   BMI 48.75 kg/m     Wt Readings from Last 3 Encounters:  01/01/19 258 lb (117 kg)  12/04/18 251 lb (113.9 kg)  11/04/18 242 lb (109.8 kg)     GEN:  Well nourished, well developed in no acute distress HEENT: Normal NECK: No JVD; No carotid bruits LYMPHATICS: No lymphadenopathy CARDIAC: RRR, no murmurs, no  rubs, no gallops RESPIRATORY:  Clear to auscultation without rales, wheezing or rhonchi  ABDOMEN: Soft, non-tender, non-distended MUSCULOSKELETAL:  No edema; No deformity  SKIN: Warm and dry LOWER EXTREMITIES: no swelling NEUROLOGIC:  Alert and oriented x 3 PSYCHIATRIC:  Normal affect   ASSESSMENT:    1. Coronary artery disease involving native coronary artery of native heart without angina pectoris   2. Simple chronic bronchitis (HCC)   3. Dyslipidemia   4. Dyspnea on exertion   5. Obesity hypoventilation syndrome (HCC)   6. Ischemic cardiomyopathy  PLAN:    In order of problems listed above:  1. Coronary artery disease stable will continue present management 2. Chronic bronchitis obviously main issues here recently had some pneumonia antibiotic given by primary care physician make her feel better and she actually sounds very 3. Dyslipidemia we will continue with atorvastatin 80 4. Dyspnea on exertion multifactorial 5. Diastolic congestive heart failure will ask her to have echocardiogram done.  Today I will do also Chem-7 as well as proBNP   Medication Adjustments/Labs and Tests Ordered: Current medicines are reviewed at length with the patient today.  Concerns regarding medicines are outlined above.  No orders of the defined types were placed in this encounter.  Medication changes: No orders of the defined types were placed in this encounter.   Signed, Georgeanna Leaobert J. Krasowski, MD, [facility] Center[facility] 01/01/2019 11:17 AM    [facility]

## 2019-01-02 LAB — BASIC METABOLIC PANEL
BUN/Creatinine Ratio: 13 (ref 9–23)
BUN: 21 mg/dL (ref 6–24)
CO2: 23 mmol/L (ref 20–29)
Calcium: 9.4 mg/dL (ref 8.7–10.2)
Chloride: 97 mmol/L (ref 96–106)
Creatinine, Ser: 1.57 mg/dL — ABNORMAL HIGH (ref 0.57–1.00)
GFR calc Af Amer: 45 mL/min/{1.73_m2} — ABNORMAL LOW (ref >59)
GFR calc non Af Amer: 39 mL/min/{1.73_m2} — ABNORMAL LOW (ref >59)
Glucose: 108 mg/dL — ABNORMAL HIGH (ref 65–99)
Potassium: 4 mmol/L (ref 3.5–5.2)
Sodium: 139 mmol/L (ref 134–144)

## 2019-01-02 LAB — PRO B NATRIURETIC PEPTIDE: NT-Pro BNP: 227 pg/mL (ref 0–249)

## 2019-01-31 ENCOUNTER — Other Ambulatory Visit: Payer: Self-pay | Admitting: Cardiology

## 2019-02-05 ENCOUNTER — Encounter: Payer: Self-pay | Admitting: Cardiology

## 2019-02-05 ENCOUNTER — Other Ambulatory Visit: Payer: Self-pay

## 2019-02-05 ENCOUNTER — Telehealth (INDEPENDENT_AMBULATORY_CARE_PROVIDER_SITE_OTHER): Payer: BLUE CROSS/BLUE SHIELD | Admitting: Cardiology

## 2019-02-05 VITALS — BP 157/80 | HR 112 | Wt 265.0 lb

## 2019-02-05 DIAGNOSIS — I2510 Atherosclerotic heart disease of native coronary artery without angina pectoris: Secondary | ICD-10-CM

## 2019-02-05 DIAGNOSIS — I420 Dilated cardiomyopathy: Secondary | ICD-10-CM

## 2019-02-05 DIAGNOSIS — R0609 Other forms of dyspnea: Secondary | ICD-10-CM

## 2019-02-05 DIAGNOSIS — R0600 Dyspnea, unspecified: Secondary | ICD-10-CM

## 2019-02-05 DIAGNOSIS — E785 Hyperlipidemia, unspecified: Secondary | ICD-10-CM

## 2019-02-05 DIAGNOSIS — J410 Simple chronic bronchitis: Secondary | ICD-10-CM

## 2019-02-05 NOTE — Progress Notes (Signed)
Virtual Visit via Video Note   This visit type was conducted due to national recommendations for restrictions regarding the COVID-19 Pandemic (e.g. social distancing) in an effort to limit this patient's exposure and mitigate transmission in our community.  Due to her co-morbid illnesses, this patient is at least at moderate risk for complications without adequate follow up.  This format is felt to be most appropriate for this patient at this time.  All issues noted in this document were discussed and addressed.  A limited physical exam was performed with this format.  Please refer to the patient's chart for her consent to telehealth for [facility].  Evaluation Performed:  Follow-up visit  This visit type was conducted due to national recommendations for restrictions regarding the COVID-19 Pandemic (e.g. social distancing).  This format is felt to be most appropriate for this patient at this time.  All issues noted in this document were discussed and addressed.  No physical exam was performed (except for noted visual exam findings with Video Visits).  Please refer to the patient's chart (MyChart message for video visits and phone note for telephone visits) for the patient's consent to telehealth for [facility].  Date:  02/05/2019  ID: Katie Rasmussen, DOB 07/10/1971, MRN [id number]   Patient Location: [address] [place] [address]   Provider location:   [facility] [place] [facility]  PCP:  Paulina Fusi, MD  Cardiologist:  Gypsy Balsam, MD     Chief Complaint: I still have swelling of lower extremities  History of Present Illness:    Katie Rasmussen is a 48 y.o. female  who presents via audio/video conferencing for a telehealth visit today.  Very complex scenario.  She does have congestive heart failure both systolic and diastolic she is a New York Heart Association class III/IV, also advanced COPD required chronic steroid use.  History of coronary artery  disease.  Recently she ended going to the emergency room because of swelling of lower extremities DVT study was done and again it was negative.  New discovery is the fact that her thyroid is hypoactive.  Again the dose of Synthroid has been increased since we waiting for effect of it.  She is actually very upset because she got nervous coming today to her home to take care of her children and nurse told her that she is to do more exercise if she would not do it she will die.  We spent a great deal of time talking about this and I told her things are looking a little more optimistic now since we discovered the problem with her thyroid I will also increase dose of furosemide from 40 mg to 60 mg daily she is coming to our office next week to have echocardiogram done will check Chem-7 at the same time.   The patient does not have symptoms concerning for COVID-19 infection (fever, chills, cough, or new SHORTNESS OF BREATH).    Prior CV studies:   The following studies were reviewed today:       Past Medical History:  Diagnosis Date  . Acute combined systolic and diastolic CHF, NYHA class 3 (HCC)   . Bronchitis due to tobacco use 08/2017  . Chronic respiratory failure (HCC)   . COPD (chronic obstructive pulmonary disease) (HCC)   . Coronary artery disease   . Diastolic CHF (HCC)   . Obesity hypoventilation syndrome (HCC)   . Pneumonia 08/2017  . Systolic CHF with reduced left ventricular function, NYHA class  3 (HCC) 09/13/2017    Past Surgical History:  Procedure Laterality Date  . CESAREAN SECTION    . CORONARY STENT INTERVENTION N/A 09/18/2017   Procedure: CORONARY STENT INTERVENTION;  Surgeon: Tonny Bollmanooper, Michael, MD;  [facility];  Service: Cardiovascular;  Laterality: N/A;  prox LAD  . LEFT HEART CATH AND CORONARY ANGIOGRAPHY N/A 09/18/2017   Procedure: LEFT HEART CATH AND CORONARY ANGIOGRAPHY;  Surgeon: Tonny Bollmanooper, Michael, MD;  [facility];  Service:  Cardiovascular;  Laterality: N/A;  . THYROID SURGERY       Current Meds  Medication Sig  . albuterol (PROVENTIL HFA;VENTOLIN HFA) 108 (90 Base) MCG/ACT inhaler Inhale 2 puffs into the lungs every 4 (four) hours as needed for wheezing or shortness of breath.  . ALPRAZolam (XANAX) 0.25 MG tablet Take 1 tablet by mouth 3 (three) times daily as needed.  Marland Kitchen. aspirin EC 81 MG EC tablet Take 1 tablet (81 mg total) by mouth daily.  Marland Kitchen. atorvastatin (LIPITOR) 80 MG tablet Take 1 tablet (80 mg total) by mouth daily at 6 PM.  . clopidogrel (PLAVIX) 75 MG tablet TAKE 1 TABLET BY MOUTH ONCE DAILY WITH BREAKFAST  . fluticasone furoate-vilanterol (BREO ELLIPTA) 100-25 MCG/INH AEPB Inhale 1 puff into the lungs daily.  . furosemide (LASIX) 40 MG tablet Take 1 tablet (40 mg total) by mouth 2 (two) times daily.  Marland Kitchen. ipratropium-albuterol (DUONEB) 0.5-2.5 (3) MG/3ML SOLN Take 3 mLs by nebulization every 6 (six) hours as needed (short of breath).   Marland Kitchen. levothyroxine (SYNTHROID, LEVOTHROID) 125 MCG tablet Take 125 mcg by mouth daily before breakfast.   . lisinopril (PRINIVIL,ZESTRIL) 2.5 MG tablet Take 1 tablet by mouth daily.  . metoprolol succinate (TOPROL-XL) 25 MG 24 hr tablet Take 0.5 tablets (12.5 mg total) by mouth daily. Take with or immediately following a meal.  . predniSONE (DELTASONE) 10 MG tablet Take 1 tablet by mouth daily with breakfast.   . sacubitril-valsartan (ENTRESTO) 24-26 MG Take 1 tablet by mouth 2 (two) times daily.  Marland Kitchen. SYNTHROID 150 MCG tablet Take 1 tablet by mouth daily.      Family History: The patient's family history includes Diabetes in her father; Heart disease in her father and mother; Hypertension in her mother.   ROS:   Please see the history of present illness.     All other systems reviewed and are negative.   Labs/Other Tests and Data Reviewed:     Recent Labs: 05/07/2018: TSH 0.066 12/04/2018: Hemoglobin 11.1; Platelets 332 01/01/2019: BUN 21; Creatinine, Ser 1.57;  NT-Pro BNP 227; Potassium 4.0; Sodium 139  Recent Lipid Panel No results found for: CHOL, TRIG, HDL, CHOLHDL, VLDL, LDLCALC, LDLDIRECT    Exam:    Vital Signs:  BP (!) 157/80   Pulse (!) 112   Wt 265 lb (120.2 kg)   BMI 50.07 kg/m     Wt Readings from Last 3 Encounters:  02/05/19 265 lb (120.2 kg)  01/01/19 258 lb (117 kg)  12/04/18 251 lb (113.9 kg)     Well nourished, well developed in no acute distress. Alert oriented x3 talking to me over the phone crying during the discussion.  Overall mild shortness of breath  Diagnosis for this visit:   1. DCM (dilated cardiomyopathy) (HCC)   2. Coronary artery disease involving native coronary artery of native heart without angina pectoris   3. Dyslipidemia   4. Dyspnea on exertion   5. Simple chronic bronchitis (HCC)      ASSESSMENT &  PLAN:    1.  Cardiomyopathy plan is to do echocardiogram which she is scheduled to have next week.  Will increase dose of diuretic. Coronary artery disease denies having any chest pain recently. 3.  Dyslipidemia on statin which I will continue. 4.  Dyspnea on exertion multifactorial continue multi-team approach.  COVID-19 Education: The signs and symptoms of COVID-19 were discussed with the patient and how to seek care for testing (follow up with PCP or arrange E-visit).  The importance of social distancing was discussed today.  Patient Risk:   After full review of this patients clinical status, I feel that they are at least moderate risk at this time.  Time:   Today, I have spent 15 minutes with the patient with telehealth technology discussing pt health issues.  I spent 5 minutes reviewing her chart before the visit.  Visit was finished at 4:30 PM.    Medication Adjustments/Labs and Tests Ordered: Current medicines are reviewed at length with the patient today.  Concerns regarding medicines are outlined above.  No orders of the defined types were placed in this encounter.  Medication  changes: No orders of the defined types were placed in this encounter.    Disposition: Follow-up in 1 month  Signed, Park Liter, MD, [facility] 02/05/2019 4:29 PM    [place]

## 2019-02-05 NOTE — Addendum Note (Signed)
Addended by: Aleatha Borer on: 02/05/2019 04:58 PM   Modules accepted: Orders

## 2019-02-05 NOTE — Patient Instructions (Signed)
Medication Instructions:  Your physician recommends that you continue on your current medications as directed. Please refer to the Current Medication list given to you today.  If you need a refill on your cardiac medications before your next appointment, please call your pharmacy.   Lab work: Your physician recommends that you have the following labs drawn: BMP when you come in for you Echo  If you have labs (blood work) drawn today and your tests are completely normal, you will receive your results only by: Marland Kitchen MyChart Message (if you have MyChart) OR . A paper copy in the mail If you have any lab test that is abnormal or we need to change your treatment, we will call you to review the results.  Testing/Procedures: None ordered  Follow-Up: At [facility], you and your health needs are our priority.  As part of our continuing mission to provide you with exceptional heart care, we have created designated Provider Care Teams.  These Care Teams include your primary Cardiologist (physician) and Advanced Practice Providers (APPs -  Physician Assistants and Nurse Practitioners) who all work together to provide you with the care you need, when you need it. You will need a follow up appointment in 1 months.  You may see Jenne Campus, MD or another member of our [place] Provider Team in [place]: Shirlee More, MD . Jyl Heinz, MD

## 2019-02-06 ENCOUNTER — Telehealth: Payer: BLUE CROSS/BLUE SHIELD | Admitting: Cardiology

## 2019-02-10 ENCOUNTER — Other Ambulatory Visit: Payer: Self-pay | Admitting: Emergency Medicine

## 2019-02-10 ENCOUNTER — Other Ambulatory Visit: Payer: Self-pay

## 2019-02-10 ENCOUNTER — Ambulatory Visit (INDEPENDENT_AMBULATORY_CARE_PROVIDER_SITE_OTHER): Payer: BC Managed Care – PPO

## 2019-02-10 DIAGNOSIS — I420 Dilated cardiomyopathy: Secondary | ICD-10-CM | POA: Diagnosis not present

## 2019-02-10 DIAGNOSIS — I5041 Acute combined systolic (congestive) and diastolic (congestive) heart failure: Secondary | ICD-10-CM

## 2019-02-10 DIAGNOSIS — R0600 Dyspnea, unspecified: Secondary | ICD-10-CM

## 2019-02-10 DIAGNOSIS — R0609 Other forms of dyspnea: Secondary | ICD-10-CM

## 2019-02-10 DIAGNOSIS — I2510 Atherosclerotic heart disease of native coronary artery without angina pectoris: Secondary | ICD-10-CM

## 2019-02-10 DIAGNOSIS — I255 Ischemic cardiomyopathy: Secondary | ICD-10-CM

## 2019-02-10 DIAGNOSIS — E785 Hyperlipidemia, unspecified: Secondary | ICD-10-CM

## 2019-02-10 MED ORDER — ALBUTEROL SULFATE HFA 108 (90 BASE) MCG/ACT IN AERS: 2.0000 | INHALATION_SPRAY | RESPIRATORY_TRACT | 0 refills | Status: DC | PRN

## 2019-02-10 NOTE — Progress Notes (Signed)
Complete echocardiogram has been performed.  Jimmy Skylynne Schlechter RDCS, RVT 

## 2019-02-11 LAB — BASIC METABOLIC PANEL
BUN/Creatinine Ratio: 12 (ref 9–23)
BUN: 21 mg/dL (ref 6–24)
CO2: 20 mmol/L (ref 20–29)
Calcium: 8.9 mg/dL (ref 8.7–10.2)
Chloride: 101 mmol/L (ref 96–106)
Creatinine, Ser: 1.71 mg/dL — ABNORMAL HIGH (ref 0.57–1.00)
GFR calc Af Amer: 40 mL/min/{1.73_m2} — ABNORMAL LOW (ref >59)
GFR calc non Af Amer: 35 mL/min/{1.73_m2} — ABNORMAL LOW (ref >59)
Glucose: 180 mg/dL — ABNORMAL HIGH (ref 65–99)
Potassium: 4.6 mmol/L (ref 3.5–5.2)
Sodium: 141 mmol/L (ref 134–144)

## 2019-02-11 LAB — PRO B NATRIURETIC PEPTIDE: NT-Pro BNP: 504 pg/mL — ABNORMAL HIGH (ref 0–249)

## 2019-02-13 ENCOUNTER — Telehealth: Payer: Self-pay | Admitting: Emergency Medicine

## 2019-02-13 DIAGNOSIS — I2510 Atherosclerotic heart disease of native coronary artery without angina pectoris: Secondary | ICD-10-CM

## 2019-02-13 DIAGNOSIS — R0609 Other forms of dyspnea: Secondary | ICD-10-CM

## 2019-02-13 DIAGNOSIS — I5041 Acute combined systolic (congestive) and diastolic (congestive) heart failure: Secondary | ICD-10-CM

## 2019-02-13 DIAGNOSIS — R0600 Dyspnea, unspecified: Secondary | ICD-10-CM

## 2019-02-13 MED ORDER — FUROSEMIDE 40 MG PO TABS: ORAL_TABLET | ORAL | 1 refills | Status: DC

## 2019-02-13 NOTE — Telephone Encounter (Signed)
Informed patient of lab results and advised her to increase lasix to 60 mg in the morning and 40 mg in the evening. Patient advised to return in 1 week for repeat labs. She verbally understands No further questions.

## 2019-02-14 ENCOUNTER — Telehealth: Payer: Self-pay | Admitting: Cardiology

## 2019-02-14 NOTE — Telephone Encounter (Signed)
Patient informed of results.  

## 2019-02-19 NOTE — Progress Notes (Signed)
Virtual Visit via Telephone Note   This visit type was conducted due to national recommendations for restrictions regarding the COVID-19 Pandemic (e.g. social distancing) in an effort to limit this patient's exposure and mitigate transmission in our community.  Due to her co-morbid illnesses, this patient is at least at moderate risk for complications without adequate follow up.  This format is felt to be most appropriate for this patient at this time.  The patient did not have access to video technology/had technical difficulties with video requiring transitioning to audio format only (telephone).  All issues noted in this document were discussed and addressed.  No physical exam could be performed with this format.  Please refer to the patient's chart for her  consent to telehealth for [facility].  Evaluation Performed:  Follow-up visit  This visit type was conducted due to national recommendations for restrictions regarding the COVID-19 Pandemic (e.g. social distancing).  This format is felt to be most appropriate for this patient at this time.  All issues noted in this document were discussed and addressed.  No physical exam was performed (except for noted visual exam findings with Video Visits).  Please refer to the patient's chart (MyChart message for video visits and phone note for telephone visits) for the patient's consent to telehealth for [facility].  Date:  02/19/2019  ID: Katie Rasmussen, DOB Jul 17, 1970, MRN [id number]   Patient Location: [place] [address]   Provider location:   [place] Office  PCP:  Nicoletta Dress, MD  Cardiologist:  Jenne Campus, MD     Chief Complaint: I am short of breath  History of Present Illness:    Katie Rasmussen is a 48 y.o. female  who presents via audio/video conferencing for a telehealth visit today.  With complex past medical history I think the leading problem overall is advanced COPD, she  also does have history of diastolic and systolic congestive heart failure.  She has a video visit with me today interestingly she came to the office thinking she will be seen physically in the office however we are not able to accommodate that.  She is complaining of having shortness of breath swelling of lower extremities.  Overall she is feeling worse.   The patient does not have symptoms concerning for COVID-19 infection (fever, chills, cough, or new SHORTNESS OF BREATH).    Prior CV studies:   The following studies were reviewed today:  Alert awake and attentive 3 mild respiratory distress.  Denies have any chest pain tightness squeezing pressure in the chest.  Swelling of lower extremities is worse     Past Medical History:  Diagnosis Date  . Acute combined systolic and diastolic CHF, NYHA class 3 ([place])   . Bronchitis due to tobacco use 08/2017  . Chronic respiratory failure ([place])   . COPD (chronic obstructive pulmonary disease) ([place])   . Coronary artery disease   . Diastolic CHF ([place])   . Obesity hypoventilation syndrome ([place])   . Pneumonia 08/2017  . Systolic CHF with reduced left ventricular function, NYHA class 3 ([place]) 09/13/2017    Past Surgical History:  Procedure Laterality Date  . CESAREAN SECTION    . CORONARY STENT INTERVENTION N/A 09/18/2017   Procedure: CORONARY STENT INTERVENTION;  Surgeon: Sherren Mocha, MD;  Location: [facility] CV LAB;  Service: Cardiovascular;  Laterality: N/A;  prox LAD  . LEFT HEART CATH AND CORONARY ANGIOGRAPHY N/A 09/18/2017   Procedure: LEFT HEART CATH AND  CORONARY ANGIOGRAPHY;  Surgeon: Tonny Bollman, MD;  Location: [facility] INVASIVE CV LAB;  Service: Cardiovascular;  Laterality: N/A;  . THYROID SURGERY       No outpatient medications have been marked as taking for the 12/04/18 encounter (Telemedicine) with Georgeanna Lea, MD.      Family History: The patient's family history includes Diabetes in her father; Heart disease in her  father and mother; Hypertension in her mother.   ROS:   Please see the history of present illness.     All other systems reviewed and are negative.   Labs/Other Tests and Data Reviewed:     Recent Labs: 05/07/2018: TSH 0.066 12/04/2018: Hemoglobin 11.1; Platelets 332 02/10/2019: BUN 21; Creatinine, Ser 1.71; NT-Pro BNP 504; Potassium 4.6; Sodium 141  Recent Lipid Panel No results found for: CHOL, TRIG, HDL, CHOLHDL, VLDL, LDLCALC, LDLDIRECT    Exam:    Vital Signs:  There were no vitals taken for this visit.    Wt Readings from Last 3 Encounters:  02/05/19 265 lb (120.2 kg)  01/01/19 258 lb (117 kg)  12/04/18 251 lb (113.9 kg)     Well nourished, well developed in no acute distress.   Diagnosis for this visit:   1. Coronary artery disease involving native coronary artery of native heart without angina pectoris   2. Dyspnea on exertion   3. Ischemic cardiomyopathy   4. Acute combined systolic and diastolic CHF, NYHA class 3 (HCC)   5. Simple chronic bronchitis (HCC)      ASSESSMENT & PLAN:    1.  Coronary disease stable from that point we will continue present management. 2.  Dyspnea on exertion always difficult issue trying to figure out if this is more heart related or more COPD/pulmonary related.  I will ask her to increase dose of diuretics to see if that helps. 3.  Ischemic cardiomyopathy we will repeat echocardiogram to recheck left ventricular ejection fraction 4.  Chronic bronchitis advised him to follow-up with pulmonary.  COVID-19 Education: The signs and symptoms of COVID-19 were discussed with the patient and how to seek care for testing (follow up with PCP or arrange E-visit).  The importance of social distancing was discussed today.  Patient Risk:   After full review of this patients clinical status, I feel that they are at least moderate risk at this time.  Time:   Today, I have spent 15 minutes with the patient with telehealth technology  discussing pt health issues.  I spent 5 minutes reviewing her chart before the visit.     Medication Adjustments/Labs and Tests Ordered: Current medicines are reviewed at length with the patient today.  Concerns regarding medicines are outlined above.  Orders Placed This Encounter  Procedures  . Basic metabolic panel  . Pro b natriuretic peptide (BNP)  . CBC   Medication changes: No orders of the defined types were placed in this encounter.    Disposition: Follow-up next few weeks  Signed, Georgeanna Lea, MD, [facility] 12/04/2018 11:14 AM    [facility]

## 2019-02-21 LAB — BASIC METABOLIC PANEL
BUN/Creatinine Ratio: 14 (ref 9–23)
BUN: 24 mg/dL (ref 6–24)
CO2: 21 mmol/L (ref 20–29)
Calcium: 9.7 mg/dL (ref 8.7–10.2)
Chloride: 97 mmol/L (ref 96–106)
Creatinine, Ser: 1.73 mg/dL — ABNORMAL HIGH (ref 0.57–1.00)
GFR calc Af Amer: 40 mL/min/{1.73_m2} — ABNORMAL LOW (ref >59)
GFR calc non Af Amer: 34 mL/min/{1.73_m2} — ABNORMAL LOW (ref >59)
Glucose: 121 mg/dL — ABNORMAL HIGH (ref 65–99)
Potassium: 3.7 mmol/L (ref 3.5–5.2)
Sodium: 139 mmol/L (ref 134–144)

## 2019-02-21 LAB — PRO B NATRIURETIC PEPTIDE: NT-Pro BNP: 235 pg/mL (ref 0–249)

## 2019-03-07 ENCOUNTER — Ambulatory Visit (INDEPENDENT_AMBULATORY_CARE_PROVIDER_SITE_OTHER): Payer: BC Managed Care – PPO | Admitting: Cardiology

## 2019-03-07 ENCOUNTER — Ambulatory Visit: Payer: Self-pay | Admitting: Family

## 2019-03-07 ENCOUNTER — Telehealth: Payer: Self-pay | Admitting: Emergency Medicine

## 2019-03-07 ENCOUNTER — Other Ambulatory Visit: Payer: Self-pay

## 2019-03-07 VITALS — BP 120/62 | HR 93 | Ht 61.0 in | Wt 275.0 lb

## 2019-03-07 DIAGNOSIS — R0609 Other forms of dyspnea: Secondary | ICD-10-CM | POA: Diagnosis not present

## 2019-03-07 DIAGNOSIS — J410 Simple chronic bronchitis: Secondary | ICD-10-CM

## 2019-03-07 DIAGNOSIS — R0600 Dyspnea, unspecified: Secondary | ICD-10-CM

## 2019-03-07 DIAGNOSIS — E785 Hyperlipidemia, unspecified: Secondary | ICD-10-CM

## 2019-03-07 DIAGNOSIS — I2510 Atherosclerotic heart disease of native coronary artery without angina pectoris: Secondary | ICD-10-CM

## 2019-03-07 DIAGNOSIS — I420 Dilated cardiomyopathy: Secondary | ICD-10-CM | POA: Diagnosis not present

## 2019-03-07 NOTE — Telephone Encounter (Signed)
Called patient and informed her she has been scheduled with Pulmonology in [place]. She is aware of date, time and location. No further questions.

## 2019-03-07 NOTE — Patient Instructions (Signed)
Medication Instructions:  Your physician recommends that you continue on your current medications as directed. Please refer to the Current Medication list given to you today.  If you need a refill on your cardiac medications before your next appointment, please call your pharmacy.   Lab work: Your physician recommends that you return for lab work today: cmp   If you have labs (blood work) drawn today and your tests are completely normal, you will receive your results only by: Marland Kitchen MyChart Message (if you have MyChart) OR . A paper copy in the mail If you have any lab test that is abnormal or we need to change your treatment, we will call you to review the results.  Testing/Procedures: None.   Follow-Up: At [facility], you and your health needs are our priority.  As part of our continuing mission to provide you with exceptional heart care, we have created designated Provider Care Teams.  These Care Teams include your primary Cardiologist (physician) and Advanced Practice Providers (APPs -  Physician Assistants and Nurse Practitioners) who all work together to provide you with the care you need, when you need it. You will need a follow up appointment in 6 weeks.  Please call our office 2 months in advance to schedule this appointment.  You may see Jenne Campus, MD or another member of our [place] Provider Team in [place]: Shirlee More, MD . Jyl Heinz, MD  Any Other Special Instructions Will Be Listed Below (If Applicable).

## 2019-03-07 NOTE — Progress Notes (Signed)
Cardiology Office Note:    Date:  03/07/2019   ID:  Katie Rasmussen, DOB 05-04-1971, MRN [id number]  PCP:  Paulina Fusi, MD  Cardiologist:  Gypsy Balsam, MD    Referring MD: Paulina Fusi, MD   Chief Complaint  Patient presents with   Follow-up   Edema    bilateral legs, and left arm, pain in left leg   Shortness of Breath    History of Present Illness:    Katie Rasmussen is a 48 y.o. female very complex past medical history.  She did have history of cardiomyopathy however with medications ejection fraction of has been normalized.  Last echocardiogram confirmed normal left ventricular ejection fraction.  She does have a chronic lung condition and she is chronically on steroids.  She is morbidly obese with swelling of lower extremities.  She is crying in my office today.  She cannot do anything she is a person that would like to go and do things however because of her condition cannot.  She is very weak and tired when she is trying to walk.  Denies have any chest pain tightness squeezing pressure burning chest strap to have palpitation in form of pounding in her chest when she is trying to do things.  Past Medical History:  Diagnosis Date   Acute combined systolic and diastolic CHF, NYHA class 3 (HCC)    Bronchitis due to tobacco use 08/2017   Chronic respiratory failure (HCC)    COPD (chronic obstructive pulmonary disease) (HCC)    Coronary artery disease    Diastolic CHF (HCC)    Obesity hypoventilation syndrome (HCC)    Pneumonia 08/2017   Systolic CHF with reduced left ventricular function, NYHA class 3 (HCC) 09/13/2017    Past Surgical History:  Procedure Laterality Date   CESAREAN SECTION     CORONARY STENT INTERVENTION N/A 09/18/2017   Procedure: CORONARY STENT INTERVENTION;  Surgeon: Tonny Bollman, MD;  Location: [facility] INVASIVE CV LAB;  Service: Cardiovascular;  Laterality: N/A;  prox LAD   LEFT HEART CATH AND CORONARY ANGIOGRAPHY N/A  09/18/2017   Procedure: LEFT HEART CATH AND CORONARY ANGIOGRAPHY;  Surgeon: Tonny Bollman, MD;  Location: [facility] INVASIVE CV LAB;  Service: Cardiovascular;  Laterality: N/A;   THYROID SURGERY      Current Medications: Current Meds  Medication Sig   albuterol (VENTOLIN HFA) 108 (90 Base) MCG/ACT inhaler Inhale 2 puffs into the lungs every 4 (four) hours as needed for wheezing or shortness of breath.   ALPRAZolam (XANAX) 0.25 MG tablet Take 1 tablet by mouth 3 (three) times daily as needed.   aspirin EC 81 MG EC tablet Take 1 tablet (81 mg total) by mouth daily.   atorvastatin (LIPITOR) 80 MG tablet Take 1 tablet (80 mg total) by mouth daily at 6 PM.   clopidogrel (PLAVIX) 75 MG tablet TAKE 1 TABLET BY MOUTH ONCE DAILY WITH BREAKFAST   fluticasone furoate-vilanterol (BREO ELLIPTA) 100-25 MCG/INH AEPB Inhale 1 puff into the lungs daily.   furosemide (LASIX) 40 MG tablet Take 60 mg in the morning and 40 mg in the evening.   ipratropium-albuterol (DUONEB) 0.5-2.5 (3) MG/3ML SOLN Take 3 mLs by nebulization every 6 (six) hours as needed (short of breath).    lisinopril (PRINIVIL,ZESTRIL) 2.5 MG tablet Take 1 tablet by mouth daily.   metoprolol succinate (TOPROL-XL) 25 MG 24 hr tablet Take 0.5 tablets (12.5 mg total) by mouth daily. Take with or immediately following a meal.   predniSONE (DELTASONE) 10 MG  tablet Take 1 tablet by mouth daily with breakfast.    sacubitril-valsartan (ENTRESTO) 24-26 MG Take 1 tablet by mouth 2 (two) times daily.   SYNTHROID 175 MCG tablet Take 1 tablet by mouth daily.      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  Social Designer, fashion/clothing strain:  Not on file   Food insecurity    Worry: Not on file    Inability: Not on file   Transportation needs    Medical: Not on file    Non-medical: Not on file  Tobacco Use   Smoking status: Former Smoker    Packs/day: 0.50    Years: 14.00    Pack years: 7.00    Types: Cigarettes    Quit date: 09/12/2017    Years since quitting: 1.4   Smokeless tobacco: Never Used  Substance and Sexual Activity   Alcohol use: No    Frequency: Never   Drug use: No   Sexual activity: Not on file  Lifestyle   Physical activity    Days per week: Not on file    Minutes per session: Not on file   Stress: Very much  Relationships   Social connections    Talks on phone: Not on file    Gets together: Not on file    Attends religious service: Not on file    Active member of club or organization: Not on file    Attends meetings of clubs or organizations: Not on file    Relationship status: Not on file  Other Topics Concern   Not on file  Social History Narrative   Not on file     Family History: The patient's family history includes Diabetes in her father; Heart disease in her father and mother; Hypertension in her mother. ROS:   Please see the history of present illness.    All 14 point review of systems negative except as described per history of present illness  EKGs/Labs/Other Studies Reviewed:      Recent Labs: 05/07/2018: TSH 0.066 12/04/2018: Hemoglobin 11.1; Platelets 332 02/20/2019: BUN 24; Creatinine, Ser 1.73; NT-Pro BNP 235; Potassium 3.7; Sodium 139  Recent Lipid Panel No results found for: CHOL, TRIG, HDL, CHOLHDL, VLDL, LDLCALC, LDLDIRECT  Physical Exam:    VS:  BP 120/62    Pulse 93    Ht 5\' 1"  (1.549 m)    Wt 275 lb (124.7 kg)    SpO2 98%    BMI 51.96 kg/m     Wt Readings from Last 3 Encounters:  03/07/19 275 lb (124.7 kg)  02/05/19 265 lb (120.2 kg)  01/01/19 258 lb (117 kg)     GEN:  Well nourished, well developed in no acute distress HEENT: Normal NECK:  No JVD; No carotid bruits LYMPHATICS: No lymphadenopathy CARDIAC: RRR, no murmurs, no rubs, no gallops RESPIRATORY:  Clear to auscultation without rales, wheezing or rhonchi poor air entry bilaterally. ABDOMEN: Soft, non-tender, non-distended MUSCULOSKELETAL:  No edema; No deformity  SKIN: Warm and dry LOWER EXTREMITIES: 2+ swelling NEUROLOGIC:  Alert and oriented x 3 PSYCHIATRIC:  Normal affect   ASSESSMENT:    1. Coronary artery  disease involving native coronary artery of native heart without angina pectoris   2. DCM (dilated cardiomyopathy) (HCC)   3. Simple chronic bronchitis (HCC)   4. Dyspnea on exertion   5. Dyslipidemia    PLAN:    In order of problems listed above:  1. Coronary disease stable denies have any recent issues. 2. History of dilated cardiomyopathy with normalization. 3. Bronchitis/COPD she is awaiting appointment with pulmonologist to address this issue. 4. Dyslipidemia continue with atorvastatin.  Overall she is a very sick lady.  I think she can benefit from seeing pulmonary team.  We will make arrangements for it.  She is also scheduled to see endocrinology which is appropriate.  I will ask her to have Chem-7 done today with intention to try small dose of Zaroxolyn to help with the swelling of lower extremities which is quite significant.  Obviously would just watch kidney function very carefully since she already does have elevation of creatinine.   Medication Adjustments/Labs and Tests Ordered: Current medicines are reviewed at length with the patient today.  Concerns regarding medicines are outlined above.  No orders of the defined types were placed in this encounter.  Medication changes: No orders of the defined types were placed in this encounter.   Signed, Georgeanna Leaobert J. Kunta Hilleary, MD, [facility] 03/07/2019 3:36 PM    [facility]

## 2019-03-08 LAB — COMPREHENSIVE METABOLIC PANEL
ALT: 17 IU/L (ref 0–32)
AST: 19 IU/L (ref 0–40)
Albumin/Globulin Ratio: 1.6 (ref 1.2–2.2)
Albumin: 4 g/dL (ref 3.8–4.8)
Alkaline Phosphatase: 86 IU/L (ref 39–117)
BUN/Creatinine Ratio: 12 (ref 9–23)
BUN: 22 mg/dL (ref 6–24)
Bilirubin Total: 0.2 mg/dL (ref 0.0–1.2)
CO2: 21 mmol/L (ref 20–29)
Calcium: 8.3 mg/dL — ABNORMAL LOW (ref 8.7–10.2)
Chloride: 99 mmol/L (ref 96–106)
Creatinine, Ser: 1.83 mg/dL — ABNORMAL HIGH (ref 0.57–1.00)
GFR calc Af Amer: 37 mL/min/{1.73_m2} — ABNORMAL LOW (ref >59)
GFR calc non Af Amer: 32 mL/min/{1.73_m2} — ABNORMAL LOW (ref >59)
Globulin, Total: 2.5 g/dL (ref 1.5–4.5)
Glucose: 212 mg/dL — ABNORMAL HIGH (ref 65–99)
Potassium: 4.4 mmol/L (ref 3.5–5.2)
Sodium: 136 mmol/L (ref 134–144)
Total Protein: 6.5 g/dL (ref 6.0–8.5)

## 2019-03-10 ENCOUNTER — Encounter: Payer: Self-pay | Admitting: Cardiology

## 2019-03-10 ENCOUNTER — Other Ambulatory Visit: Payer: Self-pay

## 2019-03-10 DIAGNOSIS — I872 Venous insufficiency (chronic) (peripheral): Secondary | ICD-10-CM

## 2019-03-11 ENCOUNTER — Other Ambulatory Visit: Payer: Self-pay

## 2019-03-11 ENCOUNTER — Encounter: Payer: Self-pay | Admitting: Critical Care Medicine

## 2019-03-11 ENCOUNTER — Ambulatory Visit (INDEPENDENT_AMBULATORY_CARE_PROVIDER_SITE_OTHER): Payer: BC Managed Care – PPO | Admitting: Critical Care Medicine

## 2019-03-11 VITALS — BP 132/72 | HR 95 | Temp 98.2°F | Ht 61.0 in | Wt 270.0 lb

## 2019-03-11 DIAGNOSIS — J45909 Unspecified asthma, uncomplicated: Secondary | ICD-10-CM

## 2019-03-11 DIAGNOSIS — R0609 Other forms of dyspnea: Secondary | ICD-10-CM | POA: Diagnosis not present

## 2019-03-11 DIAGNOSIS — Z7952 Long term (current) use of systemic steroids: Secondary | ICD-10-CM

## 2019-03-11 DIAGNOSIS — J42 Unspecified chronic bronchitis: Secondary | ICD-10-CM | POA: Diagnosis not present

## 2019-03-11 DIAGNOSIS — E8779 Other fluid overload: Secondary | ICD-10-CM

## 2019-03-11 DIAGNOSIS — J302 Other seasonal allergic rhinitis: Secondary | ICD-10-CM

## 2019-03-11 DIAGNOSIS — R0600 Dyspnea, unspecified: Secondary | ICD-10-CM

## 2019-03-11 DIAGNOSIS — I5032 Chronic diastolic (congestive) heart failure: Secondary | ICD-10-CM

## 2019-03-11 MED ORDER — ALBUTEROL SULFATE (2.5 MG/3ML) 0.083% IN NEBU: 2.5000 mg | INHALATION_SOLUTION | Freq: Four times a day (QID) | RESPIRATORY_TRACT | 12 refills | Status: AC | PRN

## 2019-03-11 MED ORDER — PREDNISONE 5 MG PO TABS: 7.5000 mg | ORAL_TABLET | Freq: Every day | ORAL | 0 refills | Status: DC

## 2019-03-11 MED ORDER — PREDNISONE 1 MG PO TABS: 4.0000 mg | ORAL_TABLET | Freq: Every day | ORAL | 1 refills | Status: DC

## 2019-03-11 MED ORDER — ALBUTEROL SULFATE HFA 108 (90 BASE) MCG/ACT IN AERS: 2.0000 | INHALATION_SPRAY | RESPIRATORY_TRACT | 11 refills | Status: DC | PRN

## 2019-03-11 MED ORDER — BREO ELLIPTA 200-25 MCG/INH IN AEPB: 1.0000 | INHALATION_SPRAY | Freq: Every day | RESPIRATORY_TRACT | 11 refills | Status: DC

## 2019-03-11 NOTE — Progress Notes (Signed)
Synopsis: Referred in August 2020 for COPD by Paulina FusiSchultz, Douglas E, MD  Subjective:   PATIENT ID: Katie Rasmussen GENDER: female DOB: [birth year], MRN: [id number]  Chief Complaint  Patient presents with  . Consult    referred by Dr Bing MatterKrasowski for evaluation of COPD    Katie Rasmussen is a 48 year old woman with a history of previous ischemic cardiomyopathy, now with recovery of her EF, and COPD/asthma overlap with chronic steroid use for >1 year. She presents today to transfer her care from Dr. Blenda [place]ri in [place].   Recently cardiology has been giving her escalating doses of diuretics to address lower extremity edema related to HFpEF.  She is lost 10 pounds in the last week, but has gained >50 pounds in the last few months.  She is up to 100 mg of Lasix daily, but reports edema up to her abdomen with abdominal distention. Following her MI 48 last year she has had multiple hospital admissions for acute hypoxic respiratory failure, requiring mechanical ventilation on 1 of these occasions.  She is unsure if these have all been related to hypervolemia decompensated heart failure.  During one such admission she was rapidly weaned off her steroids and developed symptomatic adrenal insufficiency, prompting another hospital admission.  She had an admission for sepsis after being treated with IM antibiotics and steroids as an outpatient first.  She has had asthma since childhood, but it was less severe at that time.  She has a strong family history of asthma including her mom and several members of her mother's family. She is prescribed high-dose Breo, but has not been taking it.  She has been on prednisone for greater than a year, which was supposed to be tapered off when she started Biologics. She was prescribed Fasenra in November 2019 but was never able to get insurance coverage.  She has been using albuterol several times per day for wheezing and dyspnea.  She wants to come off of the prednisone due to concerns  about side effects, including insomnia, constant hunger, and weight gain.   She has a history of allergic rhinitis, which worsens her asthma. She periodically takes Claritin, which helps her symptoms.  Perfumes and strong smells also worsen her asthma.  She infrequently has heartburn, about once every 2 weeks, for which she will occasionally take Tums.  OSA-polysomnogram last year.  Prescribed CPAP at 11 cm of water +3 L of supplemental oxygen.  She wears it nightly, seldom missing a night.  She can barely sleep without it now.  She also experiences daytime headaches without it.  Activity significantly limited due to dyspnea on exertion and wheezing.  She is barely able to walk around her own house; around 10 steps is significant exertion for her.  She is in constant fear of having an episode of respiratory failure.  He wants to know how to best tell if her dyspnea is due to her heart failure or asthma. Pain in her back and legs also limits her activity.   She quit smoking last year, as did her husband.  Previously she smoked about half a pack a day for 14 years.    Past Medical History:  Diagnosis Date  . Acute combined systolic and diastolic CHF, NYHA class 3 (HCC)   . Bronchitis due to tobacco use 08/2017  . Chronic respiratory failure (HCC)   . COPD (chronic obstructive pulmonary disease) (HCC)   . Coronary artery disease   . Diastolic CHF (HCC)   . Hypothyroidism  h/o Grave's, s/p thyroidectomy & RAI in 1990s  . Obesity hypoventilation syndrome ([place])   . Pneumonia 08/2017  . Systolic CHF with reduced left ventricular function, NYHA class 3 ([place]) 09/13/2017     Family History  Problem Relation Age of Onset  . Heart disease Mother   . Hypertension Mother   . Heart disease Father   . Diabetes Father      Past Surgical History:  Procedure Laterality Date  . CESAREAN SECTION    . CORONARY STENT INTERVENTION N/A 09/18/2017   Procedure: CORONARY STENT INTERVENTION;  Surgeon:  Sherren Mocha, MD;  Location: [facility] CV LAB;  Service: Cardiovascular;  Laterality: N/A;  prox LAD  . LEFT HEART CATH AND CORONARY ANGIOGRAPHY N/A 09/18/2017   Procedure: LEFT HEART CATH AND CORONARY ANGIOGRAPHY;  Surgeon: Sherren Mocha, MD;  Location: [facility] CV [facility];  Service: Cardiovascular;  Laterality: N/A;  . THYROID SURGERY     s/p RAI for Grave's    Social History   Socioeconomic History  . Marital status: Married    Spouse name: Not on file  . Number of children: 3  . Years of education: Not on file  . Highest education level: Not on file  Occupational History  . 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 Financial resource strain: Not on file  . Food insecurity    Worry: Not on file    Inability: Not on file  . Transportation needs    Medical: Not on file    Non-medical: Not on file  Tobacco Use  . Smoking status: Former Smoker    Packs/day: 0.50    Years: 14.00    Pack years: 7.00    Types: Cigarettes    Quit date: 09/12/2017    Years since quitting: 1.4  . Smokeless tobacco: Never Used  Substance and Sexual Activity  . Alcohol use: No    Frequency: Never  . Drug use: No  . Sexual activity: Not on file  Lifestyle  . Physical activity    Days per week: Not on file    Minutes per session: Not on file  . Stress: Very much  Relationships  . Social Herbalist on phone: Not on file    Gets together: Not on file    Attends religious service: Not on file    Active member of club or organization: Not on file    Attends meetings of clubs or organizations: Not on file    Relationship status: Not on file  . Intimate partner violence    Fear of current or ex partner: Not on file    Emotionally abused: Not on file    Physically abused: Not on file    Forced sexual activity: Not on file  Other Topics Concern  . Not on file  Social History Narrative  . Not on file     Allergies  Allergen Reactions  . Iodine Hives  and Itching  . Lovenox [Enoxaparin]   . Shellfish Allergy Hives and Itching     Immunization History  Administered Date(s) Administered  . Influenza Split 04/09/2018, 02/25/2019  . Pneumococcal Polysaccharide-23 09/07/2017    Outpatient Medications Prior to Visit  Medication Sig Dispense Refill  . ALPRAZolam (XANAX) 0.25 MG tablet Take 1 tablet by mouth 3 (three) times daily as needed.  0  . aspirin EC 81 MG EC tablet Take 1 tablet (81 mg total) by mouth daily.    Marland Kitchen  atorvastatin (LIPITOR) 80 MG tablet Take 1 tablet (80 mg total) by mouth daily at 6 PM. 90 tablet 3  . clopidogrel (PLAVIX) 75 MG tablet TAKE 1 TABLET BY MOUTH ONCE DAILY WITH BREAKFAST 90 tablet 0  . fluticasone furoate-vilanterol (BREO ELLIPTA) 100-25 MCG/INH AEPB Inhale 1 puff into the lungs daily.    . furosemide (LASIX) 40 MG tablet Take 60 mg in the morning and 40 mg in the evening. 60 tablet 1  . ipratropium-albuterol (DUONEB) 0.5-2.5 (3) MG/3ML SOLN Take 3 mLs by nebulization every 6 (six) hours as needed (short of breath).     Marland Kitchen lisinopril (PRINIVIL,ZESTRIL) 2.5 MG tablet Take 1 tablet by mouth daily.    . metoprolol succinate (TOPROL-XL) 25 MG 24 hr tablet Take 0.5 tablets (12.5 mg total) by mouth daily. Take with or immediately following a meal. 60 tablet 0  . SYNTHROID 175 MCG tablet Take 1 tablet by mouth daily.     Marland Kitchen albuterol (VENTOLIN HFA) 108 (90 Base) MCG/ACT inhaler Inhale 2 puffs into the lungs every 4 (four) hours as needed for wheezing or shortness of breath. 14 g 0  . predniSONE (DELTASONE) 10 MG tablet Take 1 tablet by mouth daily with breakfast.   0  . sacubitril-valsartan (ENTRESTO) 24-26 MG Take 1 tablet by mouth 2 (two) times daily.     No facility-administered medications prior to visit.     Review of Systems  Constitutional: Negative for chills, fever and weight loss.  HENT: Negative for sore throat.   Respiratory: Positive for shortness of breath and wheezing. Negative for cough, hemoptysis  and sputum production.   Cardiovascular: Positive for leg swelling. Negative for chest pain.  Gastrointestinal: Negative for abdominal pain, heartburn, nausea and vomiting.  Musculoskeletal: Positive for joint pain.  Endo/Heme/Allergies: Bruises/bleeds easily.     Objective:   Vitals:   03/11/19 1445  BP: 132/72  Pulse: 95  Temp: 98.2 F (36.8 C)  TempSrc: Oral  SpO2: 96%  Weight: 270 lb (122.5 kg)  Height: 5\' 1"  (1.549 m)   96% on RA BMI Readings from Last 3 Encounters:  03/11/19 51.02 kg/m  03/07/19 51.96 kg/m  02/05/19 50.07 kg/m   Wt Readings from Last 3 Encounters:  03/11/19 270 lb (122.5 kg)  03/07/19 275 lb (124.7 kg)  02/05/19 265 lb (120.2 kg)    Physical Exam Vitals signs reviewed.  Constitutional:      Appearance: Normal appearance. She is obese.     Comments: Chronically ill-appearing, sitting in a wheelchair in no acute distress  HENT:     Head: Normocephalic and atraumatic.     Nose:     Comments: Deferred due to masking requirement.    Mouth/Throat:     Comments: Deferred due to masking requirement. Eyes:     General: No scleral icterus. Neck:     Musculoskeletal: Neck supple.  Cardiovascular:     Rate and Rhythm: Normal rate and regular rhythm.     Heart sounds: No murmur.  Pulmonary:     Comments: Breathing comfortably on room air.  No witnessed coughing, no conversational dyspnea.  Clear to auscultation bilaterally.  No coughing with forced exhalation. Abdominal:     Comments: Obese, soft, nondistended.  Musculoskeletal:        General: No deformity.     Comments: Pitting edema to the thighs.  Lymphadenopathy:     Cervical: No cervical adenopathy.  Skin:    General: Skin is warm and dry.     Findings:  No rash.     Comments: Minimal bruising on arms.  Plethoric face.  Neurological:     Mental Status: She is alert.     Motor: Weakness present.     Coordination: Coordination normal.     Comments: Answering questions appropriately,  no focal deficits.  Psychiatric:        Thought Content: Thought content normal.     Comments: Tearful during encounter      CBC    Component Value Date/Time   WBC 17.8 (H) 12/04/2018 1643   WBC 11.3 (H) 09/19/2017 0346   RBC 3.78 12/04/2018 1643   RBC 4.55 09/19/2017 0346   HGB 11.1 12/04/2018 1643   HCT 32.5 (L) 12/04/2018 1643   PLT 332 12/04/2018 1643   MCV 86 12/04/2018 1643   MCH 29.4 12/04/2018 1643   MCH 28.4 09/19/2017 0346   MCHC 34.2 12/04/2018 1643   MCHC 31.0 09/19/2017 0346   RDW 15.2 12/04/2018 1643   LYMPHSABS 0.8 09/14/2017 1349   MONOABS 0.4 09/14/2017 1349   EOSABS 0.0 09/14/2017 1349   BASOSABS 0.0 09/14/2017 1349  No significant anemia, but 2 g drop in hemoglobin since early 2019.  Review of multiple recent BMPs demonstrates low normal bicarb NT-proBNP 504 --> 235  Chest Imaging- reports reviewed. (Images unavailable for personal review): Report from one view chest x-ray 07/04/2018 reviewed-intubated at that time with right lower lobe infiltrate. CT chest 06/2018 report reviewed- left upper lobe atelectasis, otherwise groundglass throughout the lungs.  Pulmonary Functions Testing Results: No flowsheet data found.  No PFTs included from outside Pulm office  Echocardiogram 02/10/2019: LVEF 60 to 65% with increased LV wall thickness but normal diastolic function. Normal RV, normal atria.  Normal valves  Heart Catheterization 06/2018: Severe CAD of the proximal LAD treated with DES, otherwise minimal disease.    Extensive review of outside records that were available.  Reports pulled from her CPAP machine indicate compliance with its use on 70 to 80% of nights with a low AHI.  On nights when it was used it was for significantly greater than 4 hours.  Assessment & Plan:     ICD-10-CM   1. Dyspnea on exertion  R06.09 Pulmonary function test    CBC w/Diff    IgE    IgE    CBC w/Diff    CANCELED: IgE    CANCELED: CBC w/Diff  2. Chronic bronchitis,  unspecified chronic bronchitis type (HCC)  J42 Pulmonary function test    CBC w/Diff    IgE    IgE    CBC w/Diff    CANCELED: IgE    CANCELED: CBC w/Diff  3. Asthma, unspecified asthma severity, unspecified whether complicated, unspecified whether persistent  J45.909 CBC w/Diff    IgE    IgE    CBC w/Diff    CANCELED: IgE    CANCELED: CBC w/Diff  4. Current chronic use of systemic steroids  Z79.52   5. Chronic heart failure with preserved ejection fraction (HCC)  I50.32   6. Other hypervolemia  E87.79   7. Seasonal allergic rhinitis, unspecified trigger  J30.2    COPD/asthma overlap.  There is a mention of bronchiectasis and outside charts, but I have no imaging to confirm this, and her symptoms are not classic. -PFTs ordered -Checking IgE and eosinophil levels, although being on prednisone will confound this - Restart high-dose Breo.  Inhaler training provided in the office today. -Taper prednisone slowly given her previous episode of adrenal insufficiency.  Will start 7.5 mg daily tomorrow for 2 weeks, followed by 5 mg daily for 2 weeks.  Following this we will decrease by 1 mg every 2 weeks.  She can stop it after she gets to 2 mg daily for 2 weeks. -Continue albuterol as needed -If she is unable to tolerate coming off steroids due to recurrence of her asthma, she would be a candidate for dupilumab.   Dyspnea on exertion- this is most certainly multifactorial due to pulmonary edema from chronic HFpEF, chronic asthma, and likely obesity.  Deconditioning is also likely playing a role. -Discussed the importance of physical activity as much as she is able, with cautions that she should always stop should she develop dizziness, faintness, or chest pain.  Individualized, regular activity is a mainstay in preventing further deconditioning. -Needs significant diuresis; appreciate cardiology's assistance with this. -Recommend healthy weight loss as a long-term goal.  Allergic rhinosinusitis  -Continue Singulair -Encouraged her to regularly use Claritin at the first sign that her symptoms are returning  Chronic steroids with history of adrenal insufficiency when quickly weaned off - Slow taper as described above - If she is intolerant to this regimen, we can continue an even slower taper  RTC in 1-2 months following PFTs.  Greater than 80 minutes minutes was spent on this encounter, with greater than 55 minutes spent face-to-face with the patient.   Current Outpatient Medications:  .  albuterol (VENTOLIN HFA) 108 (90 Base) MCG/ACT inhaler, Inhale 2 puffs into the lungs every 4 (four) hours as needed for wheezing or shortness of breath., Disp: 14 g, Rfl: 11 .  ALPRAZolam (XANAX) 0.25 MG tablet, Take 1 tablet by mouth 3 (three) times daily as needed., Disp: , Rfl: 0 .  aspirin EC 81 MG EC tablet, Take 1 tablet (81 mg total) by mouth daily., Disp: , Rfl:  .  atorvastatin (LIPITOR) 80 MG tablet, Take 1 tablet (80 mg total) by mouth daily at 6 PM., Disp: 90 tablet, Rfl: 3 .  clopidogrel (PLAVIX) 75 MG tablet, TAKE 1 TABLET BY MOUTH ONCE DAILY WITH BREAKFAST, Disp: 90 tablet, Rfl: 0 .  fluticasone furoate-vilanterol (BREO ELLIPTA) 100-25 MCG/INH AEPB, Inhale 1 puff into the lungs daily., Disp: , Rfl:  .  furosemide (LASIX) 40 MG tablet, Take 60 mg in the morning and 40 mg in the evening., Disp: 60 tablet, Rfl: 1 .  ipratropium-albuterol (DUONEB) 0.5-2.5 (3) MG/3ML SOLN, Take 3 mLs by nebulization every 6 (six) hours as needed (short of breath). , Disp: , Rfl:  .  lisinopril (PRINIVIL,ZESTRIL) 2.5 MG tablet, Take 1 tablet by mouth daily., Disp: , Rfl:  .  metoprolol succinate (TOPROL-XL) 25 MG 24 hr tablet, Take 0.5 tablets (12.5 mg total) by mouth daily. Take with or immediately following a meal., Disp: 60 tablet, Rfl: 0 .  SYNTHROID 175 MCG tablet, Take 1 tablet by mouth daily. , Disp: , Rfl:  .  albuterol (PROVENTIL) (2.5 MG/3ML) 0.083% nebulizer solution, Take 3 mLs (2.5 mg total)  by nebulization every 6 (six) hours as needed for wheezing or shortness of breath., Disp: 75 mL, Rfl: 12 .  fluticasone furoate-vilanterol (BREO ELLIPTA) 200-25 MCG/INH AEPB, Inhale 1 puff into the lungs daily., Disp: 1 each, Rfl: 11 .  [START ON 04/08/2019] predniSONE (DELTASONE) 1 MG tablet, Take 4 tablets (4 mg total) by mouth daily with breakfast. Take 4mg  daily for 2 weeks, then 3 mg daily for 2 weeks, then 2 mg daily for 2 weeks, then stop., Disp: 150  tablet, Rfl: 1 .  predniSONE (DELTASONE) 5 MG tablet, Take 1.5 tablets (7.5 mg total) by mouth daily with breakfast. Take 1.5 tablets every morning for 2 weeks. Then decrease to 1 tablet every morning for 2 weeks., Disp: 35 tablet, Rfl: 0 .  sacubitril-valsartan (ENTRESTO) 24-26 MG, Take 1 tablet by mouth 2 (two) times daily., Disp: , Rfl:   Steffanie Dunn, DO [facility] 03/11/2019 7:13 PM

## 2019-03-11 NOTE — Patient Instructions (Addendum)
Thank you for visiting Dr. Carlis Abbott at [facility]. We recommend the following: Orders Placed This Encounter  Procedures  . CBC w/Diff  . IgE  . Pulmonary function test   Orders Placed This Encounter  Procedures  . CBC w/Diff    Standing Status:   Future    Standing Expiration Date:   03/10/2020  . IgE    Standing Status:   Future    Standing Expiration Date:   03/10/2020  . Pulmonary function test    Standing Status:   Future    Standing Expiration Date:   03/10/2020    Order Specific Question:   Where should this test be performed?    Answer:   [facility]    Order Specific Question:   Full PFT: includes the following: basic spirometry, spirometry pre & post bronchodilator, diffusion capacity (DLCO), lung volumes    Answer:   Full PFT    Order Specific Question:   Diffusion capacity (DLCO)    Answer:   Yes    Order Specific Question:   Lung volumes    Answer:   Yes    Meds ordered this encounter  Medications  . predniSONE (DELTASONE) 5 MG tablet    Sig: Take 1.5 tablets (7.5 mg total) by mouth daily with breakfast. Take 1.5 tablets every morning for 2 weeks. Then decrease to 1 tablet every morning for 2 weeks.    Dispense:  35 tablet    Refill:  0  . albuterol (VENTOLIN HFA) 108 (90 Base) MCG/ACT inhaler    Sig: Inhale 2 puffs into the lungs every 4 (four) hours as needed for wheezing or shortness of breath.    Dispense:  14 g    Refill:  11  . fluticasone furoate-vilanterol (BREO ELLIPTA) 200-25 MCG/INH AEPB    Sig: Inhale 1 puff into the lungs daily.    Dispense:  1 each    Refill:  11  . albuterol (PROVENTIL) (2.5 MG/3ML) 0.083% nebulizer solution    Sig: Take 3 mLs (2.5 mg total) by nebulization every 6 (six) hours as needed for wheezing or shortness of breath.    Dispense:  75 mL    Refill:  12  . predniSONE (DELTASONE) 1 MG tablet    Sig: Take 4 tablets (4 mg total) by mouth daily with breakfast. Take 4mg  daily for 2 weeks, then 3 mg daily for 2 weeks, then  2 mg daily for 2 weeks, then stop.    Dispense:  150 tablet    Refill:  1    Take Breo every day, regardless of how you feel. Because you had trouble with coming off the prednisone in the past, we will slowly taper the steroids, decreasing the dose 2 weeks at a time: 7.5 mg  (1.5 pills) x 2 weeks, then 5mg  (1 pill) x 2 weeks, then  Then fill the new prescription 4mg  (4 pills) x 2 weeks, then 3mg  (3 pills) x 2 weeks, then 2mg  (2 pills) x 2 weeks Then stop.  Follow up in 1-2 months after PFTs.    Please do your part to reduce the spread of COVID-19.

## 2019-03-11 NOTE — Progress Notes (Signed)
   Subjective:    Patient ID: Katie Rasmussen, female    DOB: May 16, 1971, 48 y.o.   MRN: [id number]  HPI    Review of Systems  Constitutional: Negative for fever and unexpected weight change.  HENT: Negative for congestion, dental problem, ear pain, nosebleeds, postnasal drip, rhinorrhea, sinus pressure, sneezing, sore throat and trouble swallowing.   Eyes: Negative for redness and itching.  Respiratory: Negative for cough, chest tightness, shortness of breath and wheezing.   Cardiovascular: Negative for palpitations and leg swelling.  Gastrointestinal: Negative for nausea and vomiting.  Genitourinary: Negative for dysuria.  Musculoskeletal: Negative for joint swelling.  Skin: Negative for rash.  Allergic/Immunologic: Negative.  Negative for environmental allergies, food allergies and immunocompromised state.  Neurological: Negative for headaches.  Hematological: Does not bruise/bleed easily.  Psychiatric/Behavioral: Negative for dysphoric mood. The patient is not nervous/anxious.        Objective:   Physical Exam        Assessment & Plan:

## 2019-03-12 LAB — CBC WITH DIFFERENTIAL/PLATELET
Basophils Absolute: 0 10*3/uL (ref 0.0–0.1)
Basophils Relative: 0.4 % (ref 0.0–3.0)
Eosinophils Absolute: 0.1 10*3/uL (ref 0.0–0.7)
Eosinophils Relative: 0.6 % (ref 0.0–5.0)
HCT: 33.6 % — ABNORMAL LOW (ref 36.0–46.0)
Hemoglobin: 11 g/dL — ABNORMAL LOW (ref 12.0–15.0)
Lymphocytes Relative: 6.5 % — ABNORMAL LOW (ref 12.0–46.0)
Lymphs Abs: 0.9 10*3/uL (ref 0.7–4.0)
MCHC: 32.6 g/dL (ref 30.0–36.0)
MCV: 89.5 fl (ref 78.0–100.0)
Monocytes Absolute: 0.4 10*3/uL (ref 0.1–1.0)
Monocytes Relative: 2.8 % — ABNORMAL LOW (ref 3.0–12.0)
Neutro Abs: 12.7 10*3/uL — ABNORMAL HIGH (ref 1.4–7.7)
Neutrophils Relative %: 89.7 % — ABNORMAL HIGH (ref 43.0–77.0)
Platelets: 346 10*3/uL (ref 150.0–400.0)
RBC: 3.76 Mil/uL — ABNORMAL LOW (ref 3.87–5.11)
RDW: 16.5 % — ABNORMAL HIGH (ref 11.5–15.5)
WBC: 14.2 10*3/uL — ABNORMAL HIGH (ref 4.0–10.5)

## 2019-03-12 NOTE — Progress Notes (Addendum)
Patient seen in the office today and instructed on use of Breo.  Patient expressed understanding and demonstrated technique. Parke Poisson, [facility] 03/11/2019

## 2019-03-13 ENCOUNTER — Other Ambulatory Visit: Payer: Self-pay

## 2019-03-13 ENCOUNTER — Telehealth: Payer: Self-pay | Admitting: Emergency Medicine

## 2019-03-13 ENCOUNTER — Encounter: Payer: Self-pay | Admitting: Vascular Surgery

## 2019-03-13 ENCOUNTER — Ambulatory Visit (INDEPENDENT_AMBULATORY_CARE_PROVIDER_SITE_OTHER): Payer: BC Managed Care – PPO | Admitting: Vascular Surgery

## 2019-03-13 ENCOUNTER — Ambulatory Visit (HOSPITAL_COMMUNITY)
Admission: RE | Admit: 2019-03-13 | Discharge: 2019-03-13 | Disposition: A | Discharge status: Home / Self Care | Payer: BC Managed Care – PPO | Source: Ambulatory Visit | Attending: Vascular Surgery | Admitting: Vascular Surgery

## 2019-03-13 VITALS — BP 108/68 | HR 91 | Temp 98.1°F | Resp 18 | Ht 61.0 in | Wt 270.0 lb

## 2019-03-13 DIAGNOSIS — I872 Venous insufficiency (chronic) (peripheral): Secondary | ICD-10-CM | POA: Diagnosis not present

## 2019-03-13 DIAGNOSIS — I5041 Acute combined systolic (congestive) and diastolic (congestive) heart failure: Secondary | ICD-10-CM

## 2019-03-13 MED ORDER — METOLAZONE 2.5 MG PO TABS: 2.5000 mg | ORAL_TABLET | ORAL | 0 refills | Status: DC

## 2019-03-13 NOTE — Progress Notes (Signed)
REASON FOR CONSULT:    Venous insufficiency.  The consult is requested by Dr. Aretha Parrotouglas Schulz.  ASSESSMENT & PLAN:   CHRONIC VENOUS INSUFFICIENCY: This patient does have significant left lower extremity swelling.  The left leg is 2 inches bigger than the right.  The only significant finding on noninvasive study was some deep venous reflux involving the common femoral vein.  I discussed multiple measures that may help with her leg swelling.  We have discussed the importance of intermittent leg elevation and the proper positioning for this.  I have written her prescription for knee-high compression stockings with a gradient of 15 to 20 mmHg.  I have encouraged her to avoid prolonged sitting and standing.  We discussed the importance of exercise.  Because of back issues she has a difficult time walking.  I think she would be a perfect candidate for water aerobics as this will ease the pain in her back and at the same time help with compression of her veins and allow her to get some exercise.  She does have a local [employer] so I think this is a good option for her.  In addition we have discussed the importance of weight management.  Central obesity especially increases lower extremity venous pressure.  I plan on seeing her back in 3 months.  If her swelling has not improved significantly after the above measures and also the diuretics which she has recently been prescribed and I think we should obtain a CT venogram to look for evidence of compression of the left iliac vein or some other obstructive lesion that might explain her swelling.  I will plan on seeing her back in 3 months.  She knows to call sooner if she has problems.  Waverly Ferrarihristopher Mechele Kittleson, MD, FACS Beeper [phone] Office: [phone]   HPI:   Katie Rasmussen is a pleasant 48 y.o. female, who presents with the gradual onset of left leg swelling which began about 5 months ago.  She had no specific injury to the leg.  She has no history of DVT.  She  tells me that she has had multiple venous studies which showed no evidence of DVT.  She has had no abdominal surgery or inguinal surgery or radiation therapy which would suggest a cause for her lymphedema.  She does have a history of congestive heart failure.  She is had some right leg swelling but swelling is much more significant on the left side.    She does describe some achiness and heaviness in her legs which is aggravated by sitting and standing and relieved somewhat with elevation.  She does gets some shortness of breath when she is lying completely flat however.  She does not take ibuprofen.  She has tried some knee-high compression stockings which helps some.  Past Medical History:  Diagnosis Date  . Acute combined systolic and diastolic CHF, NYHA class 3 (HCC)   . Bronchitis due to tobacco use 08/2017  . Chronic respiratory failure (HCC)   . COPD (chronic obstructive pulmonary disease) (HCC)   . Coronary artery disease   . Diastolic CHF (HCC)   . Hypothyroidism    h/o Grave's, s/p thyroidectomy & RAI in 1990s  . Obesity hypoventilation syndrome (HCC)   . Pneumonia 08/2017  . Systolic CHF with reduced left ventricular function, NYHA class 3 (HCC) 09/13/2017    Family History  Problem Relation Age of Onset  . Heart disease Mother   . Hypertension Mother   . Heart disease Father   .  Diabetes Father     SOCIAL HISTORY: Social History   Socioeconomic History  . Marital status: Married    Spouse name: Not on file  . Number of children: 3  . Years of education: Not on file  . Highest education level: Not on file  Occupational History  . 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 Financial resource strain: Not on file  . Food insecurity    Worry: Not on file    Inability: Not on file  . Transportation needs    Medical: Not on file    Non-medical: Not on file  Tobacco Use  . Smoking status: Former Smoker    Packs/day: 0.50    Years: 14.00     Pack years: 7.00    Types: Cigarettes    Quit date: 09/12/2017    Years since quitting: 1.4  . Smokeless tobacco: Never Used  Substance and Sexual Activity  . Alcohol use: No    Frequency: Never  . Drug use: No  . Sexual activity: Not on file  Lifestyle  . Physical activity    Days per week: Not on file    Minutes per session: Not on file  . Stress: Very much  Relationships  . Social Musician on phone: Not on file    Gets together: Not on file    Attends religious service: Not on file    Active member of club or organization: Not on file    Attends meetings of clubs or organizations: Not on file    Relationship status: Not on file  . Intimate partner violence    Fear of current or ex partner: Not on file    Emotionally abused: Not on file    Physically abused: Not on file    Forced sexual activity: Not on file  Other Topics Concern  . Not on file  Social History Narrative  . Not on file    Allergies  Allergen Reactions  . Iodine Hives and Itching  . Lovenox [Enoxaparin]   . Shellfish Allergy Hives and Itching    Current Outpatient Medications  Medication Sig Dispense Refill  . albuterol (PROVENTIL) (2.5 MG/3ML) 0.083% nebulizer solution Take 3 mLs (2.5 mg total) by nebulization every 6 (six) hours as needed for wheezing or shortness of breath. 75 mL 12  . albuterol (VENTOLIN HFA) 108 (90 Base) MCG/ACT inhaler Inhale 2 puffs into the lungs every 4 (four) hours as needed for wheezing or shortness of breath. 14 g 11  . ALPRAZolam (XANAX) 0.25 MG tablet Take 1 tablet by mouth 3 (three) times daily as needed.  0  . aspirin EC 81 MG EC tablet Take 1 tablet (81 mg total) by mouth daily.    Marland Kitchen atorvastatin (LIPITOR) 80 MG tablet Take 1 tablet (80 mg total) by mouth daily at 6 PM. 90 tablet 3  . clopidogrel (PLAVIX) 75 MG tablet TAKE 1 TABLET BY MOUTH ONCE DAILY WITH BREAKFAST 90 tablet 0  . fluticasone furoate-vilanterol (BREO ELLIPTA) 100-25 MCG/INH AEPB Inhale 1  puff into the lungs daily.    . fluticasone furoate-vilanterol (BREO ELLIPTA) 200-25 MCG/INH AEPB Inhale 1 puff into the lungs daily. 1 each 11  . furosemide (LASIX) 40 MG tablet Take 60 mg in the morning and 40 mg in the evening. 60 tablet 1  . ipratropium-albuterol (DUONEB) 0.5-2.5 (3) MG/3ML SOLN Take 3 mLs by nebulization every 6 (six) hours as needed (short of breath).     Marland Kitchen  lisinopril (PRINIVIL,ZESTRIL) 2.5 MG tablet Take 1 tablet by mouth daily.    . metolazone (ZAROXOLYN) 2.5 MG tablet Take 1 tablet (2.5 mg total) by mouth once a week. 30 min before morning dose of lasix. 30 tablet 0  . metoprolol succinate (TOPROL-XL) 25 MG 24 hr tablet Take 0.5 tablets (12.5 mg total) by mouth daily. Take with or immediately following a meal. 60 tablet 0  . [START ON 04/08/2019] predniSONE (DELTASONE) 1 MG tablet Take 4 tablets (4 mg total) by mouth daily with breakfast. Take 4mg  daily for 2 weeks, then 3 mg daily for 2 weeks, then 2 mg daily for 2 weeks, then stop. 150 tablet 1  . predniSONE (DELTASONE) 5 MG tablet Take 1.5 tablets (7.5 mg total) by mouth daily with breakfast. Take 1.5 tablets every morning for 2 weeks. Then decrease to 1 tablet every morning for 2 weeks. 35 tablet 0  . SYNTHROID 175 MCG tablet Take 1 tablet by mouth daily.     . sacubitril-valsartan (ENTRESTO) 24-26 MG Take 1 tablet by mouth 2 (two) times daily.     No current facility-administered medications for this visit.     REVIEW OF SYSTEMS:  [X]  denotes positive finding, [ ]  denotes negative finding Cardiac  Comments:  Chest pain or chest pressure: x   Shortness of breath upon exertion: x   Short of breath when lying flat:    Irregular heart rhythm:        Vascular    Pain in calf, thigh, or hip brought on by ambulation:    Pain in feet at night that wakes you up from your sleep:     Blood clot in your veins:    Leg swelling:  x       Pulmonary    Oxygen at home: x   Productive cough:     Wheezing:          Neurologic    Sudden weakness in arms or legs:     Sudden numbness in arms or legs:     Sudden onset of difficulty speaking or slurred speech:    Temporary loss of vision in one eye:     Problems with dizziness:         Gastrointestinal    Blood in stool:     Vomited blood:         Genitourinary    Burning when urinating:     Blood in urine:        Psychiatric    Major depression:         Hematologic    Bleeding problems:    Problems with blood clotting too easily:        Skin    Rashes or ulcers:        Constitutional    Fever or chills:     PHYSICAL EXAM:   Vitals:   03/13/19 1416  BP: 108/68  Pulse: 91  Resp: 18  Temp: 98.1 F (36.7 C)  TempSrc: Temporal  SpO2: 98%  Weight: 270 lb (122.5 kg)  Height: 5\' 1"  (1.549 m)   Body mass index is 51.02 kg/m.  GENERAL: The patient is a well-nourished female, in no acute distress. The vital signs are documented above. CARDIAC: There is a regular rate and rhythm.  VASCULAR: I do not detect carotid bruits. Because of her leg swelling I cannot palpate pedal pulses.  However she has a biphasic dorsalis pedis and posterior tibial signal bilaterally with the Doppler. She  has significant bilateral lower extremity swelling.  The right calf measures 17 inches in diameter.  The left calf measures 19 inches in diameter. She does have some hyperpigmentation.    PULMONARY: There is good air exchange bilaterally without wheezing or rales. ABDOMEN: Soft and non-tender with normal pitched bowel sounds.  MUSCULOSKELETAL: There are no major deformities or cyanosis. NEUROLOGIC: No focal weakness or paresthesias are detected. SKIN: There are no ulcers or rashes noted. PSYCHIATRIC: The patient has a normal affect.  DATA:    VENOUS DUPLEX: I have independently interpreted her venous duplex scan today.  This was of the left lower extremity only.  She had no evidence of DVT.  She had deep venous reflux involving the common femoral  vein.  She did not have any significant superficial venous reflux.  I reviewed her previous venous duplex scan from 12/09/2018 which showed no evidence of DVT bilaterally.

## 2019-03-13 NOTE — Telephone Encounter (Signed)
Called patient informed her of lab results and advised her to start metolazone 2.5 mg once a week 30 min before her lasix. She was also advised to come in 2 weeks to have labs checked. Patient verbally understands. No further questions.

## 2019-03-14 LAB — IGE: IgE (Immunoglobulin E), Serum: 825 kU/L — ABNORMAL HIGH (ref <=114)

## 2019-03-14 LAB — TEST AUTHORIZATION

## 2019-03-14 LAB — ALLERGEN A FUMIGATUS M3
Aspergillus fumigatus, m3: <0.1 kU/L
Class: 0

## 2019-03-14 LAB — ASPERGILLUS FUMIGATUS (IGG) ANTIBODY: Aspergillus Fumigatus,IgG AB, Serum: 9.8 mg/L (ref <=102)

## 2019-03-14 LAB — INTERPRETATION:

## 2019-03-19 ENCOUNTER — Other Ambulatory Visit: Payer: Self-pay | Admitting: Critical Care Medicine

## 2019-03-19 DIAGNOSIS — J45909 Unspecified asthma, uncomplicated: Secondary | ICD-10-CM

## 2019-03-19 DIAGNOSIS — J302 Other seasonal allergic rhinitis: Secondary | ICD-10-CM

## 2019-03-19 DIAGNOSIS — Z7952 Long term (current) use of systemic steroids: Secondary | ICD-10-CM

## 2019-03-19 NOTE — Progress Notes (Signed)
IgE significantly elevated, but normal IgG & IgM for aspergillus. Need to obtain respiratory allergen panel to better understand her asthma.  Julian Hy, DO 03/19/19 9:54 AM [facility]

## 2019-03-19 NOTE — Progress Notes (Signed)
Please call Ms. Sorter to let her know that she has an elevated antibody level associated with allergies that needs additional follow up with allergy panel testing. Please have her check with her insurance prior to the test to ensure that she has coverage for this test. Thanks!

## 2019-04-02 ENCOUNTER — Encounter: Payer: Self-pay | Admitting: Endocrinology

## 2019-04-02 NOTE — Progress Notes (Signed)
Patient ID: Katie Rasmussen, female   DOB: March 30, 1971, 48 y.o.   MRN: [id number]            Referring Provider: Veatrice Kells  Reason for Appointment:  Hypothyroidism, new visit  Today's office visit was provided via telemedicine using video technique The patient was explained the limitations of evaluation and management by telemedicine and the availability of in person appointments.  The patient understood the limitations and agreed to proceed. Patient also understood that the telehealth visit is billable.  Location of the patient: Patient's home  Location of the provider: Physician office Only the patient and myself were participating in the encounter     History of Present Illness:   Hypothyroidism was first diagnosed in 1990  At the time of diagnosis patient had been treated with radioactive iodine for Graves' disease  She has generally been followed by her primary care physician and monitored with regular thyroid levels However she is now concerned that she is having marked variability in her thyroid levels and dosage This year her dose has ranged between 100 mcg and 225 mcg  She says that when she has a low thyroid function level she has symptoms of  fatigue, decreased motivation, dry skin, weight gain, hair loss and some cold sensitivity. Otherwise if her thyroid level is too high she will have palpitations and feels jittery      About 6 months or so ago she was on 225 mcg of Synthroid but subsequently this was reduced because of high thyroid levels and she was down to 100 mcg subsequently In 7/20 her TSH was 23.7 and the dose was increased from 125 mcg up to 175 mcg Today she was seen by her PCP and apparently because of abnormal thyroid levels was told to go up to 200 mcg daily     The patient has been treated with  brand-name Synthroid consistently  The patient takes the thyroid supplement about 3 hours before breakfast with water and does not take any iron or  calcium-containing supplements at the same time         Patient's weight history is as follows:  Wt Readings from Last 3 Encounters:  03/13/19 270 lb (122.5 kg)  03/11/19 270 lb (122.5 kg)  03/07/19 275 lb (124.7 kg)    Thyroid function results have been as follows:  Lab Results  Component Value Date   TSH 0.066 (L) 05/07/2018   TSH 1.278 09/14/2017     Past Medical History:  Diagnosis Date   Acute combined systolic and diastolic CHF, NYHA class 3 (HCC)    Bronchitis due to tobacco use 08/2017   Chronic respiratory failure (HCC)    COPD (chronic obstructive pulmonary disease) (HCC)    Coronary artery disease    Diastolic CHF (HCC)    Hypothyroidism    h/o Grave's, s/p thyroidectomy & RAI in 1990s   Obesity hypoventilation syndrome (HCC)    Pneumonia 08/2017   Systolic CHF with reduced left ventricular function, NYHA class 3 (HCC) 09/13/2017    Past Surgical History:  Procedure Laterality Date   CESAREAN SECTION     CORONARY STENT INTERVENTION N/A 09/18/2017   Procedure: CORONARY STENT INTERVENTION;  Surgeon: Tonny Bollman, MD;  Location: [facility] INVASIVE CV LAB;  Service: Cardiovascular;  Laterality: N/A;  prox LAD   LEFT HEART CATH AND CORONARY ANGIOGRAPHY N/A 09/18/2017   Procedure: LEFT HEART CATH AND CORONARY ANGIOGRAPHY;  Surgeon: Tonny Bollman, MD;  Location: [facility] INVASIVE CV LAB;  Service: Cardiovascular;  Laterality: N/A;   THYROID SURGERY     s/p RAI for Grave's    Family History  Problem Relation Age of Onset   Heart disease Mother    Hypertension Mother    Heart disease Father    Diabetes Father     Social History:  reports that she quit smoking about 18 months ago. Her smoking use included cigarettes. She has a 7.00 pack-year smoking history. She has never used smokeless tobacco. She reports that she does not drink alcohol or use drugs.  Allergies:  Allergies  Allergen Reactions   Iodine Hives and Itching   Lovenox [Enoxaparin]      Shellfish Allergy Hives and Itching    Allergies as of 04/03/2019      Reactions   Iodine Hives, Itching   Lovenox [enoxaparin]    Shellfish Allergy Hives, Itching      Medication List       Accurate as of April 03, 2019  3:26 PM. If you have any questions, ask your nurse or doctor.        albuterol 108 (90 Base) MCG/ACT inhaler Commonly known as: VENTOLIN HFA Inhale 2 puffs into the lungs every 4 (four) hours as needed for wheezing or shortness of breath.   albuterol (2.5 MG/3ML) 0.083% nebulizer solution Commonly known as: PROVENTIL Take 3 mLs (2.5 mg total) by nebulization every 6 (six) hours as needed for wheezing or shortness of breath.   ALPRAZolam 0.25 MG tablet Commonly known as: XANAX Take 1 tablet by mouth 3 (three) times daily as needed.   aspirin 81 MG EC tablet Take 1 tablet (81 mg total) by mouth daily.   atorvastatin 80 MG tablet Commonly known as: LIPITOR Take 1 tablet (80 mg total) by mouth daily at 6 PM.   clopidogrel 75 MG tablet Commonly known as: PLAVIX TAKE 1 TABLET BY MOUTH ONCE DAILY WITH BREAKFAST   Entresto 24-26 MG Generic drug: sacubitril-valsartan Take 1 tablet by mouth 2 (two) times daily.   fluticasone furoate-vilanterol 100-25 MCG/INH Aepb Commonly known as: BREO ELLIPTA Inhale 1 puff into the lungs daily.   Breo Ellipta 200-25 MCG/INH Aepb Generic drug: fluticasone furoate-vilanterol Inhale 1 puff into the lungs daily.   furosemide 40 MG tablet Commonly known as: LASIX Take 60 mg in the morning and 40 mg in the evening.   ipratropium-albuterol 0.5-2.5 (3) MG/3ML Soln Commonly known as: DUONEB Take 3 mLs by nebulization every 6 (six) hours as needed (short of breath).   lisinopril 2.5 MG tablet Commonly known as: ZESTRIL Take 1 tablet by mouth daily.   metolazone 2.5 MG tablet Commonly known as: ZAROXOLYN Take 1 tablet (2.5 mg total) by mouth once a week. 30 min before morning dose of lasix.   metoprolol  succinate 25 MG 24 hr tablet Commonly known as: TOPROL-XL Take 0.5 tablets (12.5 mg total) by mouth daily. Take with or immediately following a meal.   predniSONE 5 MG tablet Commonly known as: DELTASONE Take 1.5 tablets (7.5 mg total) by mouth daily with breakfast. Take 1.5 tablets every morning for 2 weeks. Then decrease to 1 tablet every morning for 2 weeks.   predniSONE 1 MG tablet Commonly known as: DELTASONE Take 4 tablets (4 mg total) by mouth daily with breakfast. Take 4mg  daily for 2 weeks, then 3 mg daily for 2 weeks, then 2 mg daily for 2 weeks, then stop. Start taking on: April 08, 2019   Synthroid 175 MCG tablet Generic drug: levothyroxine Take 1 tablet  by mouth daily.          Review of Systems  Constitutional: Positive for weight gain.  HENT: Negative for trouble swallowing.   Eyes: Negative for visual disturbance.  Respiratory: Positive for shortness of breath.   Cardiovascular: Positive for leg swelling.  Gastrointestinal: Negative for constipation.  Endocrine:       She is sensitive to both warm and cold temperatures Her last A1c was 6.6% and glucose level has been as high as 212.  She has been on variable dose of prednisone, currently not on any diabetes medications  Musculoskeletal: Negative for joint pain.  Skin: Positive for dry skin.  Neurological: Negative for weakness.  Psychiatric/Behavioral: Negative for insomnia.                Examination:    There were no vitals taken for this visit.    Assessment:  HYPOTHYROIDISM, post radioactive iodine treatment  Her replacement therapy has been with brand-name Synthroid with inconsistent dosages recently Considering her weight of at least 270 pounds she likely needs relatively high doses of supplements However complete records of her thyroid levels this year have not been available She was symptomatic when her TSH was about 23 in July while taking 125 mcg of levothyroxine  Most recently on  only 1 month therapy with 175 mcg her TSH is minimally higher at 6.5 done on 9/23; this result was obtained by telephone call today from the lab  Since she is not very symptomatic at this time Apparently has gained 60 pounds this year and this may be mostly from taking prednisone along with areas of hypothyroidism She does have a history of morbid obesity and mild diabetes Some of her difficulty with weight loss is because of inability to exercise with her COPD and chronic CHF  However she is apparently going to be tried on lower doses of prednisone in the near future and this may help her with weight loss  PLAN:    Although she may need continued increase in her thyroid dose will have her make changes only at least every 6 weeks and not more frequently.  Discussed that frequent changes may not allow enough equilibration of her TSH levels to determine the proper dose  For now would recommend that she stay on 175 mcg daily  Follow-up in about 6 weeks with repeat thyroid levels  Ideally with her history of cardiomyopathy and mild diabetes she should be a good candidate for an SGLT2 drug such as Jardiance with adjustment of her diuretic therapy but will defer treatment to PCP and cardiologist   Reather Littler 04/03/2019, 3:26 PM   Consultation note copy sent to the PCP  Duration of encounter including review of records, patient interview and decision making =40 minutes  Note: This office note was prepared with Dragon voice recognition system technology. Any transcriptional errors that result from this process are unintentional.

## 2019-04-03 ENCOUNTER — Ambulatory Visit (INDEPENDENT_AMBULATORY_CARE_PROVIDER_SITE_OTHER): Payer: BC Managed Care – PPO | Admitting: Endocrinology

## 2019-04-03 ENCOUNTER — Telehealth: Payer: Self-pay | Admitting: Endocrinology

## 2019-04-03 ENCOUNTER — Other Ambulatory Visit: Payer: Self-pay

## 2019-04-03 ENCOUNTER — Encounter: Payer: Self-pay | Admitting: Endocrinology

## 2019-04-03 DIAGNOSIS — I2510 Atherosclerotic heart disease of native coronary artery without angina pectoris: Secondary | ICD-10-CM

## 2019-04-03 DIAGNOSIS — I420 Dilated cardiomyopathy: Secondary | ICD-10-CM

## 2019-04-03 DIAGNOSIS — E890 Postprocedural hypothyroidism: Secondary | ICD-10-CM | POA: Insufficient documentation

## 2019-04-03 DIAGNOSIS — R000 Tachycardia, unspecified: Secondary | ICD-10-CM

## 2019-04-03 DIAGNOSIS — E876 Hypokalemia: Secondary | ICD-10-CM

## 2019-04-03 DIAGNOSIS — I130 Hypertensive heart and chronic kidney disease with heart failure and stage 1 through stage 4 chronic kidney disease, or unspecified chronic kidney disease: Secondary | ICD-10-CM | POA: Diagnosis not present

## 2019-04-03 DIAGNOSIS — I5023 Acute on chronic systolic (congestive) heart failure: Secondary | ICD-10-CM

## 2019-04-03 DIAGNOSIS — M7989 Other specified soft tissue disorders: Secondary | ICD-10-CM | POA: Diagnosis not present

## 2019-04-03 HISTORY — DX: Postprocedural hypothyroidism: E89.0

## 2019-04-03 NOTE — Telephone Encounter (Signed)
Please let the patient know that her TSH was only slightly above normal at 6.5 and would like to wait another month to recheck her thyroid before increasing her dose, she should stay on 175ug Synthroid for now

## 2019-04-04 DIAGNOSIS — R000 Tachycardia, unspecified: Secondary | ICD-10-CM | POA: Diagnosis not present

## 2019-04-04 DIAGNOSIS — I130 Hypertensive heart and chronic kidney disease with heart failure and stage 1 through stage 4 chronic kidney disease, or unspecified chronic kidney disease: Secondary | ICD-10-CM | POA: Diagnosis not present

## 2019-04-04 DIAGNOSIS — I5023 Acute on chronic systolic (congestive) heart failure: Secondary | ICD-10-CM | POA: Diagnosis not present

## 2019-04-04 DIAGNOSIS — M7989 Other specified soft tissue disorders: Secondary | ICD-10-CM | POA: Diagnosis not present

## 2019-04-04 NOTE — Telephone Encounter (Signed)
Called pt and gave her MD message. Pt verbalized understanding and stated that she would call back and schedule labs for 1 month.

## 2019-04-05 DIAGNOSIS — M7989 Other specified soft tissue disorders: Secondary | ICD-10-CM | POA: Diagnosis not present

## 2019-04-05 DIAGNOSIS — I130 Hypertensive heart and chronic kidney disease with heart failure and stage 1 through stage 4 chronic kidney disease, or unspecified chronic kidney disease: Secondary | ICD-10-CM | POA: Diagnosis not present

## 2019-04-05 DIAGNOSIS — R000 Tachycardia, unspecified: Secondary | ICD-10-CM | POA: Diagnosis not present

## 2019-04-05 DIAGNOSIS — I5023 Acute on chronic systolic (congestive) heart failure: Secondary | ICD-10-CM | POA: Diagnosis not present

## 2019-04-06 DIAGNOSIS — R000 Tachycardia, unspecified: Secondary | ICD-10-CM | POA: Diagnosis not present

## 2019-04-06 DIAGNOSIS — I130 Hypertensive heart and chronic kidney disease with heart failure and stage 1 through stage 4 chronic kidney disease, or unspecified chronic kidney disease: Secondary | ICD-10-CM | POA: Diagnosis not present

## 2019-04-06 DIAGNOSIS — M7989 Other specified soft tissue disorders: Secondary | ICD-10-CM | POA: Diagnosis not present

## 2019-04-06 DIAGNOSIS — I5023 Acute on chronic systolic (congestive) heart failure: Secondary | ICD-10-CM | POA: Diagnosis not present

## 2019-04-07 DIAGNOSIS — I130 Hypertensive heart and chronic kidney disease with heart failure and stage 1 through stage 4 chronic kidney disease, or unspecified chronic kidney disease: Secondary | ICD-10-CM | POA: Diagnosis not present

## 2019-04-07 DIAGNOSIS — I5023 Acute on chronic systolic (congestive) heart failure: Secondary | ICD-10-CM | POA: Diagnosis not present

## 2019-04-07 DIAGNOSIS — M7989 Other specified soft tissue disorders: Secondary | ICD-10-CM | POA: Diagnosis not present

## 2019-04-07 DIAGNOSIS — R000 Tachycardia, unspecified: Secondary | ICD-10-CM | POA: Diagnosis not present

## 2019-04-08 DIAGNOSIS — I5023 Acute on chronic systolic (congestive) heart failure: Secondary | ICD-10-CM | POA: Diagnosis not present

## 2019-04-08 DIAGNOSIS — I130 Hypertensive heart and chronic kidney disease with heart failure and stage 1 through stage 4 chronic kidney disease, or unspecified chronic kidney disease: Secondary | ICD-10-CM | POA: Diagnosis not present

## 2019-04-08 DIAGNOSIS — R000 Tachycardia, unspecified: Secondary | ICD-10-CM | POA: Diagnosis not present

## 2019-04-08 DIAGNOSIS — M7989 Other specified soft tissue disorders: Secondary | ICD-10-CM | POA: Diagnosis not present

## 2019-04-15 ENCOUNTER — Ambulatory Visit
Admission: RE | Admit: 2019-04-15 | Discharge: 2019-04-15 | Disposition: A | Discharge status: Home / Self Care | Payer: Self-pay | Source: Ambulatory Visit | Attending: Critical Care Medicine | Admitting: Critical Care Medicine

## 2019-04-15 ENCOUNTER — Other Ambulatory Visit: Payer: Self-pay

## 2019-04-15 DIAGNOSIS — J45909 Unspecified asthma, uncomplicated: Secondary | ICD-10-CM

## 2019-04-16 ENCOUNTER — Telehealth: Payer: Self-pay | Admitting: Cardiology

## 2019-04-16 NOTE — Telephone Encounter (Signed)
Wants to discuss recent [facility] visit and fluid retaining

## 2019-04-16 NOTE — Telephone Encounter (Signed)
Called [place] back with pulmonology. She recently saw the patient and wanted Dr. Agustin Cree to be aware that she has recently been in [facility]. Patient has appointment coming up with Dr. Agustin Cree I informed patient of this and informed her to call us with any questions in the mean time. She verbally understood no further questions. I will print records and have Dr. Geraldo Pitter review to see if there are any further recommendations until her appointment.

## 2019-04-17 ENCOUNTER — Telehealth: Payer: Self-pay | Admitting: Cardiology

## 2019-04-17 DIAGNOSIS — I5041 Acute combined systolic (congestive) and diastolic (congestive) heart failure: Secondary | ICD-10-CM

## 2019-04-17 MED ORDER — TORSEMIDE 20 MG PO TABS: 20.0000 mg | ORAL_TABLET | Freq: Two times a day (BID) | ORAL | 1 refills | Status: DC

## 2019-04-17 NOTE — Telephone Encounter (Signed)
Called patient she recently got out of [facility] and has possibly had some weight gain. She is unsure due to having the battery replaced in her scales. She is having shortness of breath still and swelling in lower extremities as she has for some time. She was taken off lasix in the hospital and put on torsemide 10 mg twice daily. Dr. Agustin Cree aware and he advises her to increase torsemide to 20 mg twice daily and have labs drawn tomorrow. Patient verbally understands no further questions.

## 2019-04-17 NOTE — Addendum Note (Signed)
Addended by: Ashok Norris on: 04/17/2019 05:17 PM   Modules accepted: Orders

## 2019-04-17 NOTE — Telephone Encounter (Signed)
Pt has gained 8 pounds in last week, pitted edema in both legs

## 2019-04-19 LAB — PRO B NATRIURETIC PEPTIDE: NT-Pro BNP: 717 pg/mL — ABNORMAL HIGH (ref 0–249)

## 2019-04-19 LAB — BASIC METABOLIC PANEL
BUN/Creatinine Ratio: 9 (ref 9–23)
BUN: 15 mg/dL (ref 6–24)
CO2: 23 mmol/L (ref 20–29)
Calcium: 9.7 mg/dL (ref 8.7–10.2)
Chloride: 103 mmol/L (ref 96–106)
Creatinine, Ser: 1.68 mg/dL — ABNORMAL HIGH (ref 0.57–1.00)
GFR calc Af Amer: 41 mL/min/{1.73_m2} — ABNORMAL LOW (ref >59)
GFR calc non Af Amer: 36 mL/min/{1.73_m2} — ABNORMAL LOW (ref >59)
Glucose: 125 mg/dL — ABNORMAL HIGH (ref 65–99)
Potassium: 4.3 mmol/L (ref 3.5–5.2)
Sodium: 142 mmol/L (ref 134–144)

## 2019-04-22 ENCOUNTER — Telehealth: Payer: Self-pay | Admitting: Emergency Medicine

## 2019-04-22 DIAGNOSIS — I5041 Acute combined systolic (congestive) and diastolic (congestive) heart failure: Secondary | ICD-10-CM

## 2019-04-22 NOTE — Telephone Encounter (Signed)
Called patient informed her of lab results and advised she continue current dose of diueretic and have labs drawn in one week. Patient verbally understands.

## 2019-04-24 ENCOUNTER — Ambulatory Visit: Payer: BC Managed Care – PPO | Admitting: Cardiology

## 2019-04-25 ENCOUNTER — Encounter: Payer: Self-pay | Admitting: Cardiology

## 2019-04-25 ENCOUNTER — Other Ambulatory Visit: Payer: Self-pay

## 2019-04-25 ENCOUNTER — Ambulatory Visit (INDEPENDENT_AMBULATORY_CARE_PROVIDER_SITE_OTHER): Payer: BC Managed Care – PPO | Admitting: Cardiology

## 2019-04-25 VITALS — BP 160/80 | HR 95 | Ht 61.0 in | Wt 277.0 lb

## 2019-04-25 DIAGNOSIS — E785 Hyperlipidemia, unspecified: Secondary | ICD-10-CM | POA: Diagnosis not present

## 2019-04-25 DIAGNOSIS — I2510 Atherosclerotic heart disease of native coronary artery without angina pectoris: Secondary | ICD-10-CM

## 2019-04-25 DIAGNOSIS — I420 Dilated cardiomyopathy: Secondary | ICD-10-CM | POA: Diagnosis not present

## 2019-04-25 DIAGNOSIS — J42 Unspecified chronic bronchitis: Secondary | ICD-10-CM | POA: Diagnosis not present

## 2019-04-25 NOTE — Patient Instructions (Signed)
Medication Instructions:  Your physician recommends that you continue on your current medications as directed. Please refer to the Current Medication list given to you today.  *If you need a refill on your cardiac medications before your next appointment, please call your pharmacy*  Lab Work: Your physician recommends that you return for lab work  Today  BMET  If you have labs (blood work) drawn today and your tests are completely normal, you will receive your results only by: Marland Kitchen MyChart Message (if you have MyChart) OR . A paper copy in the mail If you have any lab test that is abnormal or we need to change your treatment, we will call you to review the results.  Testing/Procedures: None Ordered  Follow-Up: At [facility], you and your health needs are our priority.  As part of our continuing mission to provide you with exceptional heart care, we have created designated Provider Care Teams.  These Care Teams include your primary Cardiologist (physician) and Advanced Practice Providers (APPs -  Physician Assistants and Nurse Practitioners) who all work together to provide you with the care you need, when you need it.  Your next appointment:   1 month  The format for your next appointment:   In Person  Provider:   You may see Jenne Campus, MD or the following Advanced Practice Provider on your designated Care Team:    Laurann Montana, FNP

## 2019-04-25 NOTE — Progress Notes (Signed)
Cardiology Office Note:    Date:  04/25/2019   ID:  Katie Rasmussen, DOB 1971/03/13, MRN [id number]  PCP:  Nicoletta Dress, MD  Cardiologist:  Jenne Campus, MD    Referring MD: Nicoletta Dress, MD   Chief Complaint  Patient presents with  . 6 week follow up  I have pain in my left foot  History of Present Illness:    Katie Rasmussen is a 48 y.o. female with history of cardiomyopathy however last ejection fraction was normal, coronary artery disease no recent issues, COPD which is quite advanced, morbid obesity.  Comes today to my office in follow-up.  The biggest complaint today is the fact that she cast pain in her left big toe.  Overall her legs are always swollen swelling is the same we did do DVT study in September for the swelling and there was no evidence of DVT.  There was some reflux but otherwise seems to be fine.  More she may be having gout.  I will ask her to have Chem-7 checked she does have appoint with her primary care physician on Tuesday and hopefully by the time will have results of the blood test back that will help [place] to make a decision how to treat this.  She is taking chronically steroids and the dose is gradually being decreased she may require temporary increase of the dose of this medication.  Because of her kidney dysfunction nonsteroidal anti-inflammatory medication is probably not the best choice for treating her pain in the left big toe.  I told her Tylenol she might try but Tylenol does not help usually.  Colchicine will be option but again kidney function to be checked first.  Likely cardiac wise and respiratory wise she looks quite good today.  Her breathing is good and she is feeling well from that aspect.  Past Medical History:  Diagnosis Date  . Acute combined systolic and diastolic CHF, NYHA class 3 ([place])   . Bronchitis due to tobacco use 08/2017  . Chronic respiratory failure ([place])   . COPD (chronic obstructive pulmonary disease) ([place])   .  Coronary artery disease   . Diastolic CHF ([place])   . Hypothyroidism    h/o Grave's, s/p thyroidectomy & RAI in 1990s  . Obesity hypoventilation syndrome ([place])   . Pneumonia 08/2017  . Systolic CHF with reduced left ventricular function, NYHA class 3 ([place]) 09/13/2017    Past Surgical History:  Procedure Laterality Date  . CESAREAN SECTION    . CORONARY STENT INTERVENTION N/A 09/18/2017   Procedure: CORONARY STENT INTERVENTION;  Surgeon: Sherren Mocha, MD;  Location: [place] [facility];  Service: Cardiovascular;  Laterality: N/A;  prox LAD  . LEFT HEART CATH AND CORONARY ANGIOGRAPHY N/A 09/18/2017   Procedure: LEFT HEART CATH AND CORONARY ANGIOGRAPHY;  Surgeon: Sherren Mocha, MD;  Location: [facility];  Service: Cardiovascular;  Laterality: N/A;  . THYROID SURGERY     s/p RAI for Grave's    Current Medications: Current Meds  Medication Sig  . albuterol (PROVENTIL) (2.5 MG/3ML) 0.083% nebulizer solution Take 3 mLs (2.5 mg total) by nebulization every 6 (six) hours as needed for wheezing or shortness of breath.  Marland Kitchen albuterol (VENTOLIN HFA) 108 (90 Base) MCG/ACT inhaler Inhale 2 puffs into the lungs every 4 (four) hours as needed for wheezing or shortness of breath.  . ALPRAZolam (XANAX) 0.25 MG tablet Take 1 tablet by mouth 3 (three) times daily as needed.  Marland Kitchen aspirin EC 81  MG EC tablet Take 1 tablet (81 mg total) by mouth daily.  Marland Kitchen atorvastatin (LIPITOR) 80 MG tablet Take 1 tablet (80 mg total) by mouth daily at 6 PM.  . clopidogrel (PLAVIX) 75 MG tablet TAKE 1 TABLET BY MOUTH ONCE DAILY WITH BREAKFAST  . fluticasone furoate-vilanterol (BREO ELLIPTA) 100-25 MCG/INH AEPB Inhale 1 puff into the lungs daily.  . fluticasone furoate-vilanterol (BREO ELLIPTA) 200-25 MCG/INH AEPB Inhale 1 puff into the lungs daily.  Marland Kitchen ipratropium-albuterol (DUONEB) 0.5-2.5 (3) MG/3ML SOLN Take 3 mLs by nebulization every 6 (six) hours as needed (short of breath).   Marland Kitchen lisinopril (PRINIVIL,ZESTRIL) 2.5 MG  tablet Take 1 tablet by mouth daily.  . metolazone (ZAROXOLYN) 2.5 MG tablet Take 1 tablet (2.5 mg total) by mouth once a week. 30 min before morning dose of lasix.  Marland Kitchen metoprolol succinate (TOPROL-XL) 25 MG 24 hr tablet Take 0.5 tablets (12.5 mg total) by mouth daily. Take with or immediately following a meal.  . predniSONE (DELTASONE) 1 MG tablet Take 4 tablets (4 mg total) by mouth daily with breakfast. Take 4mg  daily for 2 weeks, then 3 mg daily for 2 weeks, then 2 mg daily for 2 weeks, then stop.  . sacubitril-valsartan (ENTRESTO) 24-26 MG Take 1 tablet by mouth 2 (two) times daily.  SYNTHROID 175 MCG tablet Take 1 tablet by mouth daily.   Marland Kitchen torsemide (DEMADEX) 20 MG tablet Take 1 tablet (20 mg total) by mouth 2 (two) times daily.  . [DISCONTINUED] predniSONE (DELTASONE) 5 MG tablet Take 1.5 tablets (7.5 mg total) by mouth daily with breakfast. Take 1.5 tablets every morning for 2 weeks. Then decrease to 1 tablet every morning for 2 weeks.     Allergies:   Iodine, Lovenox [enoxaparin], and Shellfish allergy   Social History   Socioeconomic History  . Marital status: Married    Spouse name: Not on file  . Number of children: 3  . Years of education: Not on file  . Highest education level: Not on file  Occupational History  . 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 Financial resource strain: Not on file  . Food insecurity    Worry: Not on file    Inability: Not on file  . Transportation needs    Medical: Not on file    Non-medical: Not on file  Tobacco Use  . Smoking status: Former Smoker    Packs/day: 0.50    Years: 14.00    Pack years: 7.00    Types: Cigarettes    Quit date: 09/12/2017    Years since quitting: 1.6  . Smokeless tobacco: Never Used  Substance and Sexual Activity  . Alcohol use: No    Frequency: Never  . Drug use: No  . Sexual activity: Not on file  Lifestyle  . Physical activity    Days per week: Not on file    Minutes per  session: Not on file  . Stress: Very much  Relationships  . Social 11/12/2017 on phone: Not on file    Gets together: Not on file    Attends religious service: Not on file    Active member of club or organization: Not on file    Attends meetings of clubs or organizations: Not on file    Relationship status: Not on file  Other Topics Concern  . Not on file  Social History Narrative  . Not on file     Family  History: The patient's family history includes Diabetes in her father; Heart disease in her father and mother; Hypertension in her mother. ROS:   Please see the history of present illness.    All 14 point review of systems negative except as described per history of present illness  EKGs/Labs/Other Studies Reviewed:      Recent Labs: 05/07/2018: TSH 0.066 03/07/2019: ALT 17 03/11/2019: Hemoglobin 11.0; Platelets 346.0 04/18/2019: BUN 15; Creatinine, Ser 1.68; NT-Pro BNP 717; Potassium 4.3; Sodium 142  Recent Lipid Panel No results found for: CHOL, TRIG, HDL, CHOLHDL, VLDL, LDLCALC, LDLDIRECT  Physical Exam:    VS:  BP (!) 160/80   Pulse 95   Ht 5\' 1"  (1.549 m)   Wt 277 lb (125.6 kg)   SpO2 96%   BMI 52.34 kg/m     Wt Readings from Last 3 Encounters:  04/25/19 277 lb (125.6 kg)  03/13/19 270 lb (122.5 kg)  03/11/19 270 lb (122.5 kg)     GEN:  Well nourished, well developed in no acute distress HEENT: Normal NECK: No JVD; No carotid bruits LYMPHATICS: No lymphadenopathy CARDIAC: RRR, no murmurs, no rubs, no gallops RESPIRATORY:  Clear to auscultation without rales, wheezing or rhonchi  ABDOMEN: Soft, non-tender, non-distended MUSCULOSKELETAL:  No edema; No deformity  SKIN: Warm and dry LOWER EXTREMITIES: 1-2+ swelling both sides NEUROLOGIC:  Alert and oriented x 3 PSYCHIATRIC:  Normal affect   ASSESSMENT:    1. DCM (dilated cardiomyopathy) (HCC)   2. Coronary artery disease involving native coronary artery of native heart without angina pectoris    3. Chronic bronchitis, unspecified chronic bronchitis type (HCC)   4. Dyslipidemia    PLAN:    In order of problems listed above:  1. History of dilated cardiomyopathy latest ejection fraction normal. 2. Coronary disease no recent problem. 3. Chronic bronchitis.  Stable on small dose of steroids right now.  Doing well. 4. Dyslipidemia.  She is taking pravastatin which I will continue. 5. Pain in the left big toe I suspect gout.  She is to follow-up with her primary care physician will check Chem-7 to help with choice of therapy.  We may be forced to increase dose of steroids we may be forced also to cut down some diuretics.  Asked her not to take Sunday dose of Zaroxolyn.   Medication Adjustments/Labs and Tests Ordered: Current medicines are reviewed at length with the patient today.  Concerns regarding medicines are outlined above.  No orders of the defined types were placed in this encounter.  Medication changes: No orders of the defined types were placed in this encounter.   Signed, Georgeanna Lea, MD, [facility] 04/25/2019 2:20 PM    [facility]

## 2019-04-25 NOTE — Addendum Note (Signed)
Addended by: Polly Cobia A on: 04/25/2019 02:55 PM   Modules accepted: Orders

## 2019-04-26 LAB — BASIC METABOLIC PANEL
BUN/Creatinine Ratio: 9 (ref 9–23)
BUN: 13 mg/dL (ref 6–24)
CO2: 24 mmol/L (ref 20–29)
Calcium: 8.9 mg/dL (ref 8.7–10.2)
Chloride: 100 mmol/L (ref 96–106)
Creatinine, Ser: 1.52 mg/dL — ABNORMAL HIGH (ref 0.57–1.00)
GFR calc Af Amer: 46 mL/min/{1.73_m2} — ABNORMAL LOW (ref >59)
GFR calc non Af Amer: 40 mL/min/{1.73_m2} — ABNORMAL LOW (ref >59)
Glucose: 132 mg/dL — ABNORMAL HIGH (ref 65–99)
Potassium: 3.8 mmol/L (ref 3.5–5.2)
Sodium: 140 mmol/L (ref 134–144)

## 2019-04-28 ENCOUNTER — Other Ambulatory Visit: Payer: Self-pay | Admitting: Cardiology

## 2019-05-02 ENCOUNTER — Other Ambulatory Visit: Payer: Self-pay

## 2019-05-05 NOTE — Progress Notes (Addendum)
Patient ID: Katie Rasmussen, female   DOB: [birth year], 48 y.o.   MRN: [id number]            Referring Provider: Veatrice Kells  Reason for Appointment:  Hypothyroidism, follow-up visit    History of Present Illness:   Hypothyroidism was first diagnosed in 1990  At the time of diagnosis patient had been treated with radioactive iodine for Graves' disease  History at initial visit: She has generally been followed by her primary care physician and monitored with regular thyroid levels However she is now concerned that she is having marked variability in her thyroid levels and dosage This year her dose has ranged between 100 mcg and 225 mcg  She says that when she has a low thyroid function level she has symptoms of  fatigue, decreased motivation, dry skin, weight gain, hair loss and some cold sensitivity. Otherwise if her thyroid level is too high she will have palpitations and feels jittery      About 6 months or so ago she was on 225 mcg of Synthroid but subsequently this was reduced because of high thyroid levels and she was down to 100 mcg subsequently In 7/20 her TSH was 23.7 and the dose was increased from 125 mcg up to 175 mcg  RECENT HISTORY  The patient has been treated with  brand-name Synthroid consistently She is now taking 175 mcg since 01/2019  She has had symptoms of fatigue and cold intolerance but this is not any worse or new recently She had previously been concerned about weight gain but recently appears to be losing weight especially with reducing prednisone  The patient takes the thyroid supplement at 6 AM about 3 hours before breakfast with water and does not take any iron or calcium-containing supplements at the same time         Patient's weight history is as follows:  Wt Readings from Last 3 Encounters:  05/06/19 271 lb 6.4 oz (123.1 kg)  04/25/19 277 lb (125.6 kg)  03/13/19 270 lb (122.5 kg)    Thyroid function results have been as follows:  Most recent  TSH was 6.48 done on 04/02/2019  Lab Results  Component Value Date   TSH 0.066 (L) 05/07/2018   TSH 1.278 09/14/2017     Past Medical History:  Diagnosis Date  . Acute combined systolic and diastolic CHF, NYHA class 3 (HCC)   . Bronchitis due to tobacco use 08/2017  . Chronic respiratory failure (HCC)   . COPD (chronic obstructive pulmonary disease) (HCC)   . Coronary artery disease   . Diastolic CHF (HCC)   . Hypothyroidism    h/o Grave's, s/p thyroidectomy & RAI in 1990s  . Obesity hypoventilation syndrome (HCC)   . Pneumonia 08/2017  . Systolic CHF with reduced left ventricular function, NYHA class 3 (HCC) 09/13/2017    Past Surgical History:  Procedure Laterality Date  . CESAREAN SECTION    . CORONARY STENT INTERVENTION N/A 09/18/2017   Procedure: CORONARY STENT INTERVENTION;  Surgeon: Tonny Bollman, MD;  Location: [facility] INVASIVE CV LAB;  Service: Cardiovascular;  Laterality: N/A;  prox LAD  . LEFT HEART CATH AND CORONARY ANGIOGRAPHY N/A 09/18/2017   Procedure: LEFT HEART CATH AND CORONARY ANGIOGRAPHY;  Surgeon: Tonny Bollman, MD;  Location: [facility] INVASIVE CV LAB;  Service: Cardiovascular;  Laterality: N/A;  . THYROID SURGERY     s/p RAI for Grave's    Family History  Problem Relation Age of Onset  . Heart disease Mother   .  Hypertension Mother   . Heart disease Father   . Diabetes Father     Social History:  reports that she quit smoking about 19 months ago. Her smoking use included cigarettes. She has a 7.00 pack-year smoking history. She has never used smokeless tobacco. She reports that she does not drink alcohol or use drugs.  Allergies:  Allergies  Allergen Reactions  . Iodine Hives and Itching  . Lovenox [Enoxaparin]   . Shellfish Allergy Hives and Itching    Allergies as of 05/06/2019      Reactions   Iodine Hives, Itching   Lovenox [enoxaparin]    Shellfish Allergy Hives, Itching      Medication List       Accurate as of May 06, 2019  1:50  PM. If you have any questions, ask your nurse or doctor.        albuterol 108 (90 Base) MCG/ACT inhaler Commonly known as: VENTOLIN HFA Inhale 2 puffs into the lungs every 4 (four) hours as needed for wheezing or shortness of breath.   albuterol (2.5 MG/3ML) 0.083% nebulizer solution Commonly known as: PROVENTIL Take 3 mLs (2.5 mg total) by nebulization every 6 (six) hours as needed for wheezing or shortness of breath.   ALPRAZolam 0.25 MG tablet Commonly known as: XANAX Take 1 tablet by mouth 3 (three) times daily as needed.   aspirin 81 MG EC tablet Take 1 tablet (81 mg total) by mouth daily.   atorvastatin 80 MG tablet Commonly known as: LIPITOR Take 1 tablet (80 mg total) by mouth daily at 6 PM.   clopidogrel 75 MG tablet Commonly known as: PLAVIX TAKE 1 TABLET BY MOUTH ONCE DAILY WITH BREAKFAST   Entresto 24-26 MG Generic drug: sacubitril-valsartan Take 1 tablet by mouth 2 (two) times daily.   fluticasone furoate-vilanterol 100-25 MCG/INH Aepb Commonly known as: BREO ELLIPTA Inhale 1 puff into the lungs daily.   Breo Ellipta 200-25 MCG/INH Aepb Generic drug: fluticasone furoate-vilanterol Inhale 1 puff into the lungs daily.   ipratropium-albuterol 0.5-2.5 (3) MG/3ML Soln Commonly known as: DUONEB Take 3 mLs by nebulization every 6 (six) hours as needed (short of breath).   lisinopril 2.5 MG tablet Commonly known as: ZESTRIL Take 1 tablet by mouth daily.   metolazone 2.5 MG tablet Commonly known as: ZAROXOLYN Take 1 tablet (2.5 mg total) by mouth once a week. 30 min before morning dose of lasix.   metoprolol succinate 25 MG 24 hr tablet Commonly known as: TOPROL-XL TAKE 1/2 TABLET BY MOUTH ONCE DAILY. *TAKE WITH OR IMMEDIATELY FOLLOWING A MEAL*   predniSONE 1 MG tablet Commonly known as: DELTASONE Take 4 tablets (4 mg total) by mouth daily with breakfast. Take 4mg  daily for 2 weeks, then 3 mg daily for 2 weeks, then 2 mg daily for 2 weeks, then stop.    Synthroid 175 MCG tablet Generic drug: levothyroxine Take 1 tablet by mouth daily.   torsemide 20 MG tablet Commonly known as: DEMADEX Take 1 tablet (20 mg total) by mouth 2 (two) times daily.          Review of Systems  Cardiovascular: Positive for leg swelling.  Endocrine: Positive for fatigue and cold intolerance.       She has diabetes with A1c 6.6, has been recommended follow-up with PCP                Examination:    BP 138/86 (BP Location: Left Arm, Patient Position: Sitting, Cuff Size: Large)   Pulse  78   Temp 98.2 F (36.8 C) (Oral)   Ht 5\' 1"  (1.549 m)   Wt 271 lb 6.4 oz (123.1 kg)   SpO2 98%   BMI 51.28 kg/m   No mass felt in the thyroid area Does appear to have lower leg edema Biceps reflexes difficult to elicit  Assessment:  HYPOTHYROIDISM, post radioactive iodine treatment  She has been on 175 mcg brand-name Synthroid Difficult to assess her symptoms because she has had fatigue and cold intolerance but also has reportedly issues with anemia She is very consistent with her Synthroid supplement before breakfast daily  Mild diabetes with A1c 6.6: Followed by PCP As mentioned before because of her cardiomyopathy she is a good candidate for SGLT2 drug like Invokana  PLAN:   Check thyroid levels today and decide on appropriate dosage Again discussed that we should adjust her Synthroid dose only every 6 weeks or longer to let the dose change equilibrate She will follow-up with her PCP regarding diabetes follow-up and management   05/06/2019, 1:50 PM        Note: This office note was prepared with Dragon voice recognition system technology. Any transcriptional errors that result from this process are unintentional.

## 2019-05-06 ENCOUNTER — Other Ambulatory Visit: Payer: Self-pay

## 2019-05-06 ENCOUNTER — Telehealth: Payer: Self-pay | Admitting: Cardiology

## 2019-05-06 ENCOUNTER — Ambulatory Visit (INDEPENDENT_AMBULATORY_CARE_PROVIDER_SITE_OTHER): Payer: BC Managed Care – PPO | Admitting: Endocrinology

## 2019-05-06 ENCOUNTER — Encounter: Payer: Self-pay | Admitting: Endocrinology

## 2019-05-06 VITALS — BP 138/86 | HR 78 | Temp 98.2°F | Ht 61.0 in | Wt 271.4 lb

## 2019-05-06 DIAGNOSIS — E890 Postprocedural hypothyroidism: Secondary | ICD-10-CM | POA: Diagnosis not present

## 2019-05-06 NOTE — Telephone Encounter (Signed)
Patient would like a call to discuss items

## 2019-05-07 LAB — T4, FREE: Free T4: 1.31 ng/dL (ref 0.82–1.77)

## 2019-05-07 LAB — TSH: TSH: 1.95 u[IU]/mL (ref 0.450–4.500)

## 2019-05-07 NOTE — Progress Notes (Signed)
Please call to let patient know that the thyroid results are normal and no change in dosage needed

## 2019-05-07 NOTE — Telephone Encounter (Signed)
Let's call her in 2 days and see what BP is doing

## 2019-05-07 NOTE — Telephone Encounter (Signed)
Called patient she is worried because when she was in the hospital she was taken off of her lisinopril 2.5 mg daily and forgot to mention it to Dr. Agustin Cree at last appointment. Patient reports blood pressure today was 180/90, 173/90 and her most recent one today was 164/91. She does have a headache at the back of her head, no blurred vision. She wants to know if she should be taking something for her blood pressure, will consult with Dr. Agustin Cree about this and follow up with patient.

## 2019-05-08 NOTE — Telephone Encounter (Signed)
Called patient, informed her Dr. Agustin Cree advises we hold off on starting back the lisinopril. We asked her to monitor blood pressure and see how it does. Patient reports her blood pressure is much better today. We will call her tomorrow and see how she is.

## 2019-05-09 NOTE — Telephone Encounter (Signed)
Called patient to follow up, she states that she is feeling fine. Her current blood pressure is 165/90. She is still off the lisinopril. I let her know that we would give her a call on Monday.

## 2019-05-12 ENCOUNTER — Other Ambulatory Visit: Payer: Self-pay | Admitting: Cardiology

## 2019-05-12 NOTE — Telephone Encounter (Signed)
Left message for patient to return call.

## 2019-05-14 NOTE — Telephone Encounter (Signed)
Left message for patient to return call.

## 2019-05-16 NOTE — Telephone Encounter (Signed)
Left message for patient to return call.

## 2019-05-16 NOTE — Telephone Encounter (Signed)
Patient called back. She reports she is doing much better and has no concerns at this time. Confirmed her appointment next week. Advised her to call us with any changes. She verbally understood.

## 2019-05-20 DIAGNOSIS — D649 Anemia, unspecified: Secondary | ICD-10-CM

## 2019-05-20 DIAGNOSIS — D72829 Elevated white blood cell count, unspecified: Secondary | ICD-10-CM

## 2019-05-20 DIAGNOSIS — Z7952 Long term (current) use of systemic steroids: Secondary | ICD-10-CM

## 2019-05-23 ENCOUNTER — Other Ambulatory Visit: Payer: Self-pay

## 2019-05-23 ENCOUNTER — Ambulatory Visit (INDEPENDENT_AMBULATORY_CARE_PROVIDER_SITE_OTHER): Payer: BC Managed Care – PPO | Admitting: Cardiology

## 2019-05-23 ENCOUNTER — Encounter: Payer: Self-pay | Admitting: Cardiology

## 2019-05-23 VITALS — BP 130/70 | HR 86 | Ht 61.0 in | Wt 276.0 lb

## 2019-05-23 DIAGNOSIS — J42 Unspecified chronic bronchitis: Secondary | ICD-10-CM | POA: Diagnosis not present

## 2019-05-23 DIAGNOSIS — I420 Dilated cardiomyopathy: Secondary | ICD-10-CM | POA: Diagnosis not present

## 2019-05-23 DIAGNOSIS — R0600 Dyspnea, unspecified: Secondary | ICD-10-CM | POA: Diagnosis not present

## 2019-05-23 DIAGNOSIS — R0609 Other forms of dyspnea: Secondary | ICD-10-CM

## 2019-05-23 DIAGNOSIS — E785 Hyperlipidemia, unspecified: Secondary | ICD-10-CM

## 2019-05-23 DIAGNOSIS — I2510 Atherosclerotic heart disease of native coronary artery without angina pectoris: Secondary | ICD-10-CM | POA: Diagnosis not present

## 2019-05-23 NOTE — Progress Notes (Signed)
Cardiology Office Note:    Date:  05/23/2019   ID:  Katie Rasmussen, DOB 1971-03-02, MRN [id number]  PCP:  Paulina Fusi, MD  Cardiologist:  Gypsy Balsam, MD    Referring MD: Paulina Fusi, MD   Chief Complaint  Patient presents with  . Follow-up  Doing better  History of Present Illness:    Katie Rasmussen is a 48 y.o. female complex past medical history which include mild diastolic congestive heart failure, advanced COPD, chronic swelling of lower extremities, recently recognized gout, diabetes she comes today to my office for follow-up will doing better gout is under control she does not have any pain anymore.  Shortness of breath is still a problem but overall she seems to be doing better she is only on 3 mg of prednisone daily as well has 20 mg of Lasix twice daily.  I think the way she looks with this dosages of medical [place] is a great achievement.  We talked in length about what to do in the situation recently she visited her hematologist who identified she is anemic I think if we will be able to improve her blood counts she will feel better as well.  Past Medical History:  Diagnosis Date  . Acute combined systolic and diastolic CHF, NYHA class 3 (HCC)   . Bronchitis due to tobacco use 08/2017  . Chronic respiratory failure (HCC)   . COPD (chronic obstructive pulmonary disease) (HCC)   . Coronary artery disease   . Diastolic CHF (HCC)   . Hypothyroidism    h/o Grave's, s/p thyroidectomy & RAI in 1990s  . Obesity hypoventilation syndrome (HCC)   . Pneumonia 08/2017  . Systolic CHF with reduced left ventricular function, NYHA class 3 (HCC) 09/13/2017    Past Surgical History:  Procedure Laterality Date  . CESAREAN SECTION    . CORONARY STENT INTERVENTION N/A 09/18/2017   Procedure: CORONARY STENT INTERVENTION;  Surgeon: Tonny Bollman, MD;  Location: [facility] INVASIVE CV LAB;  Service: Cardiovascular;  Laterality: N/A;  prox LAD  . LEFT HEART CATH AND  CORONARY ANGIOGRAPHY N/A 09/18/2017   Procedure: LEFT HEART CATH AND CORONARY ANGIOGRAPHY;  Surgeon: Tonny Bollman, MD;  Location: [facility] INVASIVE CV LAB;  Service: Cardiovascular;  Laterality: N/A;  . THYROID SURGERY     s/p RAI for Grave's    Current Medications: Current Meds  Medication Sig  . albuterol (PROVENTIL) (2.5 MG/3ML) 0.083% nebulizer solution Take 3 mLs (2.5 mg total) by nebulization every 6 (six) hours as needed for wheezing or shortness of breath.  Marland Kitchen albuterol (VENTOLIN HFA) 108 (90 Base) MCG/ACT inhaler Inhale 2 puffs into the lungs every 4 (four) hours as needed for wheezing or shortness of breath.  . allopurinol (ZYLOPRIM) 100 MG tablet Take 50 mg by mouth daily.  Marland Kitchen ALPRAZolam (XANAX) 0.25 MG tablet Take 1 tablet by mouth 3 (three) times daily as needed.  Marland Kitchen aspirin EC 81 MG EC tablet Take 1 tablet (81 mg total) by mouth daily.  Marland Kitchen atorvastatin (LIPITOR) 80 MG tablet Take 1 tablet (80 mg total) by mouth daily at 6 PM.  . clopidogrel (PLAVIX) 75 MG tablet TAKE 1 TABLET BY MOUTH ONCE DAILY WITH BREAKFAST.  . fluticasone furoate-vilanterol (BREO ELLIPTA) 100-25 MCG/INH AEPB Inhale 1 puff into the lungs daily.  . fluticasone furoate-vilanterol (BREO ELLIPTA) 200-25 MCG/INH AEPB Inhale 1 puff into the lungs daily.  Marland Kitchen ipratropium-albuterol (DUONEB) 0.5-2.5 (3) MG/3ML SOLN Take 3 mLs by nebulization every 6 (six) hours as needed (short  of breath).   Marland Kitchen lisinopril (PRINIVIL,ZESTRIL) 2.5 MG tablet Take 1 tablet by mouth daily.  . metolazone (ZAROXOLYN) 2.5 MG tablet Take 1 tablet (2.5 mg total) by mouth once a week. 30 min before morning dose of lasix.  Marland Kitchen metoprolol succinate (TOPROL-XL) 25 MG 24 hr tablet TAKE 1/2 TABLET BY MOUTH ONCE DAILY. *TAKE WITH OR IMMEDIATELY FOLLOWING A MEAL*  . predniSONE (DELTASONE) 1 MG tablet Take 4 tablets (4 mg total) by mouth daily with breakfast. Take 4mg  daily for 2 weeks, then 3 mg daily for 2 weeks, then 2 mg daily for 2 weeks, then stop.  .  sacubitril-valsartan (ENTRESTO) 24-26 MG Take 1 tablet by mouth 2 (two) times daily.  Marland Kitchen SYNTHROID 175 MCG tablet Take 1 tablet by mouth daily.   Marland Kitchen torsemide (DEMADEX) 20 MG tablet Take 1 tablet (20 mg total) by mouth 2 (two) times daily.     Allergies:   Iodine, Lovenox [enoxaparin], and Shellfish allergy   Social History   Socioeconomic History  . Marital status: Married    Spouse name: Not on file  . Number of children: 3  . Years of education: Not on file  . Highest education level: Not on file  Occupational History  . 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 Financial resource strain: Not on file  . Food insecurity    Worry: Not on file    Inability: Not on file  . Transportation needs    Medical: Not on file    Non-medical: Not on file  Tobacco Use  . Smoking status: Former Smoker    Packs/day: 0.50    Years: 14.00    Pack years: 7.00    Types: Cigarettes    Quit date: 09/12/2017    Years since quitting: 1.6  . Smokeless tobacco: Never Used  Substance and Sexual Activity  . Alcohol use: No    Frequency: Never  . Drug use: No  . Sexual activity: Not on file  Lifestyle  . Physical activity    Days per week: Not on file    Minutes per session: Not on file  . Stress: Very much  Relationships  . Social Herbalist on phone: Not on file    Gets together: Not on file    Attends religious service: Not on file    Active member of club or organization: Not on file    Attends meetings of clubs or organizations: Not on file    Relationship status: Not on file  Other Topics Concern  . Not on file  Social History Narrative  . Not on file     Family History: The patient's family history includes Diabetes in her father; Heart disease in her father and mother; Hypertension in her mother. ROS:   Please see the history of present illness.    All 14 point review of systems negative except as described per history of present illness   EKGs/Labs/Other Studies Reviewed:      Recent Labs: 03/07/2019: ALT 17 03/11/2019: Hemoglobin 11.0; Platelets 346.0 04/18/2019: NT-Pro BNP 717 04/25/2019: BUN 13; Creatinine, Ser 1.52; Potassium 3.8; Sodium 140 05/06/2019: TSH 1.950  Recent Lipid Panel No results found for: CHOL, TRIG, HDL, CHOLHDL, VLDL, LDLCALC, LDLDIRECT  Physical Exam:    VS:  BP 130/70   Pulse 86   Ht 5\' 1"  (1.549 m)   Wt 276 lb (125.2 kg)   SpO2 98%   BMI 52.15 kg/m  Wt Readings from Last 3 Encounters:  05/23/19 276 lb (125.2 kg)  05/06/19 271 lb 6.4 oz (123.1 kg)  04/25/19 277 lb (125.6 kg)     GEN:  Well nourished, well developed in no acute distress HEENT: Normal NECK: No JVD; No carotid bruits LYMPHATICS: No lymphadenopathy CARDIAC: RRR, no murmurs, no rubs, no gallops RESPIRATORY:  Clear to auscultation without rales, wheezing or rhonchi  ABDOMEN: Soft, non-tender, non-distended MUSCULOSKELETAL:  No edema; No deformity  SKIN: Warm and dry LOWER EXTREMITIES: no swelling NEUROLOGIC:  Alert and oriented x 3 PSYCHIATRIC:  Normal affect   ASSESSMENT:    1. DCM (dilated cardiomyopathy) (HCC)   2. Coronary artery disease involving native coronary artery of native heart without angina pectoris   3. Chronic bronchitis, unspecified chronic bronchitis type (HCC)   4. Dyspnea on exertion   5. Dyslipidemia    PLAN:    In order of problems listed above:  1. Cardiomyopathy but latest echocardiogram showed normal left ventricle ejection fraction will continue present management. 2. Coronary disease stable from that point review continue present management 3. Chronic bronchitis management per medicine team pulmonary 4. Dyspnea on exertion multifactorial however pulmonary problem undoubtedly play the most significant role here 5. Dyslipidemia last LDL 64 HDL 79 we will continue present management.   Medication Adjustments/Labs and Tests Ordered: Current medicines are reviewed at length with the  patient today.  Concerns regarding medicines are outlined above.  No orders of the defined types were placed in this encounter.  Medication changes: No orders of the defined types were placed in this encounter.   Signed, Georgeanna Lea, MD, [facility] 05/23/2019 3:15 PM    [facility]

## 2019-05-23 NOTE — Patient Instructions (Signed)

## 2019-05-27 DIAGNOSIS — E538 Deficiency of other specified B group vitamins: Secondary | ICD-10-CM

## 2019-05-27 DIAGNOSIS — D509 Iron deficiency anemia, unspecified: Secondary | ICD-10-CM

## 2019-06-10 ENCOUNTER — Other Ambulatory Visit (HOSPITAL_COMMUNITY)
Admission: RE | Admit: 2019-06-10 | Discharge: 2019-06-10 | Disposition: A | Discharge status: Home / Self Care | Payer: BC Managed Care – PPO | Source: Ambulatory Visit | Attending: Critical Care Medicine | Admitting: Critical Care Medicine

## 2019-06-10 DIAGNOSIS — Z20828 Contact with and (suspected) exposure to other viral communicable diseases: Secondary | ICD-10-CM | POA: Diagnosis not present

## 2019-06-10 DIAGNOSIS — Z01812 Encounter for preprocedural laboratory examination: Secondary | ICD-10-CM | POA: Diagnosis present

## 2019-06-11 LAB — NOVEL CORONAVIRUS, NAA (HOSP ORDER, SEND-OUT TO REF LAB; TAT 18-24 HRS): SARS-CoV-2, NAA: NOT DETECTED

## 2019-06-12 ENCOUNTER — Encounter: Payer: Self-pay | Admitting: Vascular Surgery

## 2019-06-12 ENCOUNTER — Other Ambulatory Visit: Payer: Self-pay

## 2019-06-12 ENCOUNTER — Ambulatory Visit (INDEPENDENT_AMBULATORY_CARE_PROVIDER_SITE_OTHER): Payer: BC Managed Care – PPO | Admitting: Vascular Surgery

## 2019-06-12 VITALS — BP 154/78 | HR 93 | Temp 98.4°F | Resp 22 | Ht 61.0 in | Wt 274.0 lb

## 2019-06-12 DIAGNOSIS — I872 Venous insufficiency (chronic) (peripheral): Secondary | ICD-10-CM | POA: Diagnosis not present

## 2019-06-12 NOTE — Progress Notes (Signed)
Patient name: Katie Rasmussen MRN: [id number] DOB: 06-17-1971 Sex: female  REASON FOR VISIT:   Follow-up of chronic venous insufficiency.  HPI:   Katie Rasmussen is a pleasant 48 y.o. female who I last saw on 03/13/2019.  The patient presented with gradual onset of left leg swelling which be great began in April 2020.  She has no previous history of DVT.  She had multiple venous studies which showed no evidence of DVT.  She described aching and heaviness in her legs which was aggravated by sitting and standing and relieved somewhat with elevation.  She has been wearing knee-high compression stockings which helped some.  I felt that she had evidence of significant chronic venous insufficiency with the left leg being 2 inches bigger than the right.  The only significant finding on her noninvasive study was some deep venous reflux involving the common femoral vein.  I discussed with her conservative treatment including the importance of intermittent leg elevation, knee-high compression stockings with a gradient of 15-20, avoiding prolonged sitting and standing, exercise, including water aerobics, and the importance of weight management.  I set her up for a 35-month follow-up visit.  My feeling was that if her symptoms and swelling did not improve with conservative treatment and also the diuretics which she was recently prescribed it might be worth considering a CT venogram to look for compression of the iliac vein or some other obstructive process that might explain her swelling.  This patient has multiple medical comorbidities including combined systolic and diastolic congestive heart failure (NYHA class III), dilated cardiomyopathy, ischemic cardiomyopathy, coronary artery disease, history of respiratory failure, asthma, and obesity.  SYMPTOMS: Currently her main complaint is swelling in both legs and she is very concerned about her weight.  She understands that her morbid obesity is  affecting all of her other issues including her pulmonary status and leg swelling.  She also describes that her left leg feels like "a bag of potatoes.".  She describes some back issues and back pain and I suspect that her weakness in the left leg is related to her lumbar disc disease.  CONSERVATIVE TREATMENT: The patient has been elevating her legs effectively and this has helped with her swelling.  She has been wearing her knee-high compression stockings with a gradient of 15 to 20 mmHg.  She has a difficult time getting these on so I do not think she would tolerate tighter stockings.  We have again discussed the importance of avoiding prolonged sitting and standing.  We discussed potentially considering water aerobics for exercise.  In addition she would like a referral for evaluation for possible surgery for her morbid obesity and I have given her this information.  VENOUS INTERVENTIONS: No previous venous interventions  Past Medical History:  Diagnosis Date  . Acute combined systolic and diastolic CHF, NYHA class 3 ([place])   . Bronchitis due to tobacco use 08/2017  . Chronic respiratory failure ([place])   . COPD (chronic obstructive pulmonary disease) ([place])   . Coronary artery disease   . Diastolic CHF ([place])   . Hypothyroidism    h/o Grave's, s/p thyroidectomy & RAI in 1990s  . Obesity hypoventilation syndrome ([place])   . Pneumonia 08/2017  . Systolic CHF with reduced left ventricular function, NYHA class 3 ([place]) 09/13/2017    Family History  Problem Relation Age of Onset  . Heart disease Mother   . Hypertension Mother   . Heart disease Father   . Diabetes Father  SOCIAL HISTORY: Social History   Tobacco Use  . Smoking status: Former Smoker    Packs/day: 0.50    Years: 14.00    Pack years: 7.00    Types: Cigarettes    Quit date: 09/12/2017    Years since quitting: 1.7  . Smokeless tobacco: Never Used  Substance Use Topics  . Alcohol use: No    Frequency: Never     Allergies  Allergen Reactions  . Iodine Hives and Itching  . Lovenox [Enoxaparin]   . Shellfish Allergy Hives and Itching    Current Outpatient Medications  Medication Sig Dispense Refill  . albuterol (PROVENTIL) (2.5 MG/3ML) 0.083% nebulizer solution Take 3 mLs (2.5 mg total) by nebulization every 6 (six) hours as needed for wheezing or shortness of breath. 75 mL 12  . albuterol (VENTOLIN HFA) 108 (90 Base) MCG/ACT inhaler Inhale 2 puffs into the lungs every 4 (four) hours as needed for wheezing or shortness of breath. 14 g 11  . allopurinol (ZYLOPRIM) 100 MG tablet Take 50 mg by mouth daily.    Marland Kitchen ALPRAZolam (XANAX) 0.25 MG tablet Take 1 tablet by mouth 3 (three) times daily as needed.  0  . aspirin EC 81 MG EC tablet Take 1 tablet (81 mg total) by mouth daily.    Marland Kitchen atorvastatin (LIPITOR) 80 MG tablet Take 1 tablet (80 mg total) by mouth daily at 6 PM. 90 tablet 3  . clopidogrel (PLAVIX) 75 MG tablet TAKE 1 TABLET BY MOUTH ONCE DAILY WITH BREAKFAST. 90 tablet 1  . fluticasone furoate-vilanterol (BREO ELLIPTA) 100-25 MCG/INH AEPB Inhale 1 puff into the lungs daily.    . fluticasone furoate-vilanterol (BREO ELLIPTA) 200-25 MCG/INH AEPB Inhale 1 puff into the lungs daily. 1 each 11  . ipratropium-albuterol (DUONEB) 0.5-2.5 (3) MG/3ML SOLN Take 3 mLs by nebulization every 6 (six) hours as needed (short of breath).     . metoprolol succinate (TOPROL-XL) 25 MG 24 hr tablet TAKE 1/2 TABLET BY MOUTH ONCE DAILY. *TAKE WITH OR IMMEDIATELY FOLLOWING A MEAL* 60 tablet 0  . predniSONE (DELTASONE) 1 MG tablet Take 4 tablets (4 mg total) by mouth daily with breakfast. Take 4mg  daily for 2 weeks, then 3 mg daily for 2 weeks, then 2 mg daily for 2 weeks, then stop. 150 tablet 1  . SYNTHROID 175 MCG tablet Take 1 tablet by mouth daily.     Marland Kitchen torsemide (DEMADEX) 20 MG tablet Take 1 tablet (20 mg total) by mouth 2 (two) times daily. 180 tablet 1  . lisinopril (PRINIVIL,ZESTRIL) 2.5 MG tablet Take 1 tablet by  mouth daily.    . metolazone (ZAROXOLYN) 2.5 MG tablet Take 1 tablet (2.5 mg total) by mouth once a week. 30 min before morning dose of lasix. 30 tablet 0  . sacubitril-valsartan (ENTRESTO) 24-26 MG Take 1 tablet by mouth 2 (two) times daily.     No current facility-administered medications for this visit.     REVIEW OF SYSTEMS:  [X]  denotes positive finding, [ ]  denotes negative finding Cardiac  Comments:  Chest pain or chest pressure:    Shortness of breath upon exertion: x   Short of breath when lying flat: x   Irregular heart rhythm:        Vascular    Pain in calf, thigh, or hip brought on by ambulation:    Pain in feet at night that wakes you up from your sleep:     Blood clot in your veins:    Leg  swelling:  x       Pulmonary    Oxygen at home:    Productive cough:     Wheezing:         Neurologic    Sudden weakness in arms or legs:     Sudden numbness in arms or legs:     Sudden onset of difficulty speaking or slurred speech:    Temporary loss of vision in one eye:     Problems with dizziness:         Gastrointestinal    Blood in stool:     Vomited blood:         Genitourinary    Burning when urinating:     Blood in urine:        Psychiatric    Major depression:         Hematologic    Bleeding problems:    Problems with blood clotting too easily:        Skin    Rashes or ulcers:        Constitutional    Fever or chills:     PHYSICAL EXAM:   Vitals:   06/12/19 1429  BP: (!) 154/78  Pulse: 93  Resp: (!) 22  Temp: 98.4 F (36.9 C)  TempSrc: Temporal  SpO2: 97%  Weight: 274 lb (124.3 kg)  Height: 5\' 1"  (1.549 m)   Body mass index is 51.77 kg/m.   GENERAL: The patient is a well-nourished female, in no acute distress. The vital signs are documented above. CARDIAC: There is a regular rate and rhythm.  VASCULAR: I do not detect carotid bruits. On the right side she has a biphasic posterior tibial signal and a brisk lateral tarsal signal. On  the left side she has a biphasic posterior tibial signal and a brisk monophasic dorsalis pedis signal She has bilateral lower extremity swelling. The right calf measures 17-1/4 inches in diameter which is essentially unchanged compared to her measurements in September The left calf measures 18-3/4 inches which is slightly improved from her measurements in September PULMONARY: There is good air exchange bilaterally without wheezing or rales. ABDOMEN: Soft and non-tender with normal pitched bowel sounds.  MUSCULOSKELETAL: There are no major deformities or cyanosis. NEUROLOGIC: No focal weakness or paresthesias are detected. SKIN: There are no ulcers or rashes noted. PSYCHIATRIC: The patient has a normal affect.  DATA:    I reviewed her most recent labs.  Her GFR was 40  MEDICAL ISSUES:   BILATERAL LOWER EXTREMITY SWELLING: This patient has bilateral lower extremity swelling related to some chronic venous insufficiency, congestive heart failure, and chronic kidney disease.  In addition certainly this is affected by her morbid obesity.  Her BMI is 51.  I have encouraged her to continue to elevate her legs as we have previously discussed and she has been using her leg rest.  She will continue to wear knee-high compression stockings with a gradient of 15-20.  She would like referral for consideration for morbid obesity surgery and have given her that information.  She understands that central obesity certainly does increase lower extremity venous pressure and contribute to her leg swelling.  I plan on seeing her back in 1 year but she knows to call sooner if she has any problems.  STAGE III CHRONIC KIDNEY DISEASE: She has stage III chronic kidney disease and for this reason I would not recommend an aggressive approach to her left leg swelling in terms of a CT venogram given  the risk of contrast.  MORBID OBESITY: We have referred her to [place] [facility] for evaluation for morbid obesity  surgery.  Waverly Ferrarihristopher Javid Kemler [facility] [phone]

## 2019-06-13 ENCOUNTER — Ambulatory Visit (INDEPENDENT_AMBULATORY_CARE_PROVIDER_SITE_OTHER): Payer: BC Managed Care – PPO | Admitting: Critical Care Medicine

## 2019-06-13 ENCOUNTER — Encounter: Payer: Self-pay | Admitting: Critical Care Medicine

## 2019-06-13 VITALS — BP 124/66 | HR 83 | Temp 97.5°F | Ht 61.0 in | Wt 277.0 lb

## 2019-06-13 DIAGNOSIS — J4550 Severe persistent asthma, uncomplicated: Secondary | ICD-10-CM | POA: Diagnosis not present

## 2019-06-13 DIAGNOSIS — J302 Other seasonal allergic rhinitis: Secondary | ICD-10-CM

## 2019-06-13 DIAGNOSIS — R0609 Other forms of dyspnea: Secondary | ICD-10-CM

## 2019-06-13 DIAGNOSIS — Z7952 Long term (current) use of systemic steroids: Secondary | ICD-10-CM

## 2019-06-13 DIAGNOSIS — R0600 Dyspnea, unspecified: Secondary | ICD-10-CM | POA: Diagnosis not present

## 2019-06-13 DIAGNOSIS — J42 Unspecified chronic bronchitis: Secondary | ICD-10-CM

## 2019-06-13 LAB — PULMONARY FUNCTION TEST
DL/VA % pred: 117 %
DL/VA: 5.21 ml/min/mmHg/L
DLCO unc % pred: 91 %
DLCO unc: 17.51 ml/min/mmHg
FEF 25-75 Post: 1.57 L/sec
FEF 25-75 Pre: 1.09 L/sec
FEF2575-%Change-Post: 44 %
FEF2575-%Pred-Post: 58 %
FEF2575-%Pred-Pre: 40 %
FEV1-%Change-Post: 11 %
FEV1-%Pred-Post: 66 %
FEV1-%Pred-Pre: 59 %
FEV1-Post: 1.7 L
FEV1-Pre: 1.52 L
FEV1FVC-%Change-Post: 2 %
FEV1FVC-%Pred-Pre: 90 %
FEV6-%Change-Post: 9 %
FEV6-%Pred-Post: 72 %
FEV6-%Pred-Pre: 66 %
FEV6-Post: 2.27 L
FEV6-Pre: 2.08 L
FEV6FVC-%Pred-Post: 102 %
FEV6FVC-%Pred-Pre: 102 %
FVC-%Change-Post: 9 %
FVC-%Pred-Post: 70 %
FVC-%Pred-Pre: 64 %
FVC-Post: 2.27 L
FVC-Pre: 2.08 L
Post FEV1/FVC ratio: 75 %
Post FEV6/FVC ratio: 100 %
Pre FEV1/FVC ratio: 73 %
Pre FEV6/FVC Ratio: 100 %
RV % pred: 128 %
RV: 2.05 L
TLC % pred: 90 %
TLC: 4.15 L

## 2019-06-13 MED ORDER — MONTELUKAST SODIUM 10 MG PO TABS: 10.0000 mg | ORAL_TABLET | Freq: Every day | ORAL | 11 refills | Status: DC

## 2019-06-13 MED ORDER — TIOTROPIUM BROMIDE MONOHYDRATE 18 MCG IN CAPS: 18.0000 ug | ORAL_CAPSULE | Freq: Every day | RESPIRATORY_TRACT | 11 refills | Status: DC

## 2019-06-13 MED ORDER — PREDNISONE 1 MG PO TABS: 1.0000 mg | ORAL_TABLET | Freq: Every day | ORAL | 0 refills | Status: DC

## 2019-06-13 NOTE — Patient Instructions (Signed)
Thank you for visiting Dr. Carlis Abbott at [facility]. We recommend the following: Orders Placed This Encounter  Procedures  . Aspergillus IgE Panel  . Allergen, A fumigatus IgG  . Aspergillus Precipitins  . Aspergillus Precipitins   Orders Placed This Encounter  Procedures  . Aspergillus IgE Panel  . Allergen, A fumigatus IgG  . Aspergillus Precipitins  . Aspergillus Precipitins    Meds ordered this encounter  Medications  . tiotropium (SPIRIVA) 18 MCG inhalation capsule    Sig: Place 1 capsule (18 mcg total) into inhaler and inhale daily.    Dispense:  30 capsule    Refill:  11  . montelukast (SINGULAIR) 10 MG tablet    Sig: Take 1 tablet (10 mg total) by mouth at bedtime.    Dispense:  30 tablet    Refill:  11  . predniSONE (DELTASONE) 1 MG tablet    Sig: Take 1 tablet (1 mg total) by mouth daily with breakfast.    Dispense:  14 tablet    Refill:  0    Return in about 3 months (around 09/11/2019). with Dr. Carlis Abbott.   Dupixent information [email or web address]   Please do your part to reduce the spread of COVID-19.

## 2019-06-13 NOTE — Progress Notes (Signed)
Synopsis: Referred in August 2020 for asthma by Paulina Fusi, MD.  Subjective:   PATIENT ID: Katie Rasmussen GENDER: female DOB: [birth year], MRN: [id number]  Chief Complaint  Patient presents with   Follow-up    Patient is here for PFT and ROV. Patient is getting B-12 injections and iron infusions and has noticed a significant improvment with her breathing.     Katie Rasmussen is a 48 year old woman with a history of severe steroid-dependent asthma, ischemic cardiomyopathy due to previous MI, obesity, and chronic anemia who presents for follow-up.  Her breathing is improved since restarting Breo.  She denies nighttime symptoms, coughing, and wheezing.  She is using her albuterol less, but still requiring 4-5 times per week PRN in addition to using it every morning.  She still has dyspnea on exertion, but this is improved since getting B12 and iron infusions, as has her energy level.  She is concerned because she has black mold in her house, and has been working on getting this resolved.  She follows with vascular surgery for chronic venous insufficiency.  She has been using her compression stockings, but has quickly recurrent symptoms.  They are concerned that her weight is the biggest precipitator of her leg edema.  She recently saw her cardiologist 3 weeks ago and has had recovery in her EF since her MI.  She was not able to remain on her ACE inhibitor due to hypotension.      OV 03/11/2019: Katie Rasmussen is a 48 year old woman with a history of previous ischemic cardiomyopathy, now with recovery of her EF, and COPD/asthma overlap with chronic steroid use for >1 year. She presents today to transfer her care from Dr. Blenda Nicely in [place].   Recently cardiology has been giving her escalating doses of diuretics to address lower extremity edema related to HFpEF.  She is lost 10 pounds in the last week, but has gained >50 pounds in the last few months.  She is up to 100 mg of Lasix daily,  but reports edema up to her abdomen with abdominal distention. Following her MI last year she has had multiple hospital admissions for acute hypoxic respiratory failure, requiring mechanical ventilation on 1 of these occasions.  She is unsure if these have all been related to hypervolemia decompensated heart failure.  During one such admission she was rapidly weaned off her steroids and developed symptomatic adrenal insufficiency, prompting another hospital admission.  She had an admission for sepsis after being treated with IM antibiotics and steroids as an outpatient first.  She has had asthma since childhood, but it was less severe at that time.  She has a strong family history of asthma including her mom and several members of her mother's family. She is prescribed high-dose Breo, but has not been taking it.  She has been on prednisone for greater than a year, which was supposed to be tapered off when she started [employer]olog[employer]. She was prescribed Fasenra in November 2019 but was never able to get insurance coverage.  She has been using albuterol several times per day for wheezing and dyspnea.  She wants to come off of the prednisone due to concerns about side effects, including insomnia, constant hunger, and weight gain.   She has a history of allergic rhinitis, which worsens her asthma. She periodically takes Claritin, which helps her symptoms.  Perfumes and strong smells also worsen her asthma.  She infrequently has heartburn, about once every 2 weeks, for which she will occasionally take Tums.  OSA-polysomnogram last year.  Prescribed CPAP at 11 cm of water +3 L of supplemental oxygen.  She wears it nightly, seldom missing a night.  She can barely sleep without it now.  She also experiences daytime headaches without it.  Activity significantly limited due to dyspnea on exertion and wheezing.  She is barely able to walk around her own house; around 10 steps is significant exertion for her.  She is in  constant fear of having an episode of respiratory failure.  He wants to know how to best tell if her dyspnea is due to her heart failure or asthma. Pain in her back and legs also limits her activity.   She quit smoking last year, as did her husband.  Previously she smoked about half a pack a day for 14 years.    Past Medical History:  Diagnosis Date   Acute combined systolic and diastolic CHF, NYHA class 3 (HCC)    Bronchitis due to tobacco use 08/2017   Chronic respiratory failure (HCC)    CKD (chronic kidney disease), stage III    COPD (chronic obstructive pulmonary disease) (HCC)    Coronary artery disease    Diastolic CHF (HCC)    Hypothyroidism    h/o Grave's, s/p thyroidectomy & RAI in 1990s   Obesity hypoventilation syndrome ([facility])    Pneumonia 06/4579   Systolic CHF with reduced left ventricular function, NYHA class 3 ([facility]) 09/13/2017     Family History  Problem Relation Age of Onset   Heart disease Mother    Hypertension Mother    Heart disease Father    Diabetes Father      Past Surgical History:  Procedure Laterality Date   CESAREAN SECTION     CORONARY STENT INTERVENTION N/A 09/18/2017   Procedure: CORONARY STENT INTERVENTION;  Surgeon: Sherren Mocha, MD;  Location: [facility];  Service: Cardiovascular;  Laterality: N/A;  prox LAD   LEFT HEART CATH AND CORONARY ANGIOGRAPHY N/A 09/18/2017   Procedure: LEFT HEART CATH AND CORONARY ANGIOGRAPHY;  Surgeon: Sherren Mocha, MD;  Location: [facility];  Service: Cardiovascular;  Laterality: N/A;   THYROID SURGERY     s/p RAI for Grave's    Social History   Socioeconomic History   Marital status: Married    Spouse name: Not on file   Number of children: 3   Years of education: Not on file   Highest education level: Not on file  Occupational History   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  Social Designer, fashion/clothing strain: Not on file   Food  insecurity    Worry: Not on file    Inability: Not on file   Transportation needs    Medical: Not on file    Non-medical: Not on file  Tobacco Use   Smoking status: Former Smoker    Packs/day: 0.50    Years: 14.00    Pack years: 7.00    Types: Cigarettes    Quit date: 09/12/2017    Years since quitting: 1.7   Smokeless tobacco: Never Used  Substance and Sexual Activity   Alcohol use: No    Frequency: Never   Drug use: No   Sexual activity: Not on file  Lifestyle   Physical activity    Days per week: Not on file    Minutes per session: Not on file   Stress: Very much  Relationships   Social connections    Talks on phone: Not  on file    Gets together: Not on file    Attends religious service: Not on file    Active member of club or organization: Not on file    Attends meetings of clubs or organizations: Not on file    Relationship status: Not on file   Intimate partner violence    Fear of current or ex partner: Not on file    Emotionally abused: Not on file    Physically abused: Not on file    Forced sexual activity: Not on file  Other Topics Concern   Not on file  Social History Narrative   Not on file     Allergies  Allergen Reactions   Iodine Hives and Itching   Lovenox [Enoxaparin]    Shellfish Allergy Hives and Itching     Immunization History  Administered Date(s) Administered   Influenza Split 04/09/2018, 02/25/2019   Pneumococcal Polysaccharide-23 09/07/2017    Outpatient Medications Prior to Visit  Medication Sig Dispense Refill   albuterol (PROVENTIL) (2.5 MG/3ML) 0.083% nebulizer solution Take 3 mLs (2.5 mg total) by nebulization every 6 (six) hours as needed for wheezing or shortness of breath. 75 mL 12   albuterol (VENTOLIN HFA) 108 (90 Base) MCG/ACT inhaler Inhale 2 puffs into the lungs every 4 (four) hours as needed for wheezing or shortness of breath. 14 g 11   allopurinol (ZYLOPRIM) 100 MG tablet Take 50 mg by mouth daily.      ALPRAZolam (XANAX) 0.25 MG tablet Take 1 tablet by mouth 3 (three) times daily as needed.  0   aspirin EC 81 MG EC tablet Take 1 tablet (81 mg total) by mouth daily.     atorvastatin (LIPITOR) 80 MG tablet Take 1 tablet (80 mg total) by mouth daily at 6 PM. 90 tablet 3   clopidogrel (PLAVIX) 75 MG tablet TAKE 1 TABLET BY MOUTH ONCE DAILY WITH BREAKFAST. 90 tablet 1   fluticasone furoate-vilanterol (BREO ELLIPTA) 100-25 MCG/INH AEPB Inhale 1 puff into the lungs daily.     fluticasone furoate-vilanterol (BREO ELLIPTA) 200-25 MCG/INH AEPB Inhale 1 puff into the lungs daily. 1 each 11   ipratropium-albuterol (DUONEB) 0.5-2.5 (3) MG/3ML SOLN Take 3 mLs by nebulization every 6 (six) hours as needed (short of breath).      metolazone (ZAROXOLYN) 2.5 MG tablet Take 1 tablet (2.5 mg total) by mouth once a week. 30 min before morning dose of lasix. 30 tablet 0   metoprolol succinate (TOPROL-XL) 25 MG 24 hr tablet TAKE 1/2 TABLET BY MOUTH ONCE DAILY. *TAKE WITH OR IMMEDIATELY FOLLOWING A MEAL* 60 tablet 0   predniSONE (DELTASONE) 1 MG tablet Take 4 tablets (4 mg total) by mouth daily with breakfast. Take 4mg  daily for 2 weeks, then 3 mg daily for 2 weeks, then 2 mg daily for 2 weeks, then stop. 150 tablet 1   SYNTHROID 175 MCG tablet Take 1 tablet by mouth daily.      torsemide (DEMADEX) 20 MG tablet Take 1 tablet (20 mg total) by mouth 2 (two) times daily. 180 tablet 1   lisinopril (PRINIVIL,ZESTRIL) 2.5 MG tablet Take 1 tablet by mouth daily.     sacubitril-valsartan (ENTRESTO) 24-26 MG Take 1 tablet by mouth 2 (two) times daily.     No facility-administered medications prior to visit.     Review of Systems  Constitutional: Negative for chills, fever and weight loss.  HENT: Negative for sore throat.   Respiratory: Positive for shortness of breath and wheezing. Negative  for cough, hemoptysis and sputum production.   Cardiovascular: Positive for leg swelling. Negative for chest pain.    Gastrointestinal: Negative for abdominal pain, heartburn, nausea and vomiting.  Musculoskeletal: Positive for joint pain.  Endo/Heme/Allergies: Bruises/bleeds easily.     Objective:   Vitals:   06/13/19 1510  BP: 124/66  Pulse: 83  Temp: (!) 97.5 F (36.4 C)  TempSrc: Temporal  SpO2: 97%  Weight: 277 lb (125.6 kg)  Height: 5\' 1"  (1.549 m)   97% on RA BMI Readings from Last 3 Encounters:  06/13/19 52.34 kg/m  06/12/19 51.77 kg/m  05/23/19 52.15 kg/m   Wt Readings from Last 3 Encounters:  06/13/19 277 lb (125.6 kg)  06/12/19 274 lb (124.3 kg)  05/23/19 276 lb (125.2 kg)    Physical Exam Vitals signs reviewed.  HENT:     Head: Normocephalic and atraumatic.     Nose:     Comments: Deferred due to masking requirement.    Mouth/Throat:     Comments: Deferred due to masking requirement. Eyes:     General: No scleral icterus. Neck:     Musculoskeletal: Neck supple.  Cardiovascular:     Rate and Rhythm: Normal rate and regular rhythm.     Heart sounds: No murmur.  Pulmonary:     Comments: Breathing comfortably on room air, no conversational dyspnea.  CTAB. Abdominal:     General: There is distension.     Palpations: Abdomen is soft.     Tenderness: There is no abdominal tenderness.  Musculoskeletal:     Comments: BLE edema  Lymphadenopathy:     Cervical: No cervical adenopathy.  Skin:    General: Skin is warm and dry.     Findings: No rash.  Neurological:     General: No focal deficit present.     Mental Status: She is alert.     Motor: No weakness.     Coordination: Coordination normal.      CBC    Component Value Date/Time   WBC 14.2 (H) 03/11/2019 1600   RBC 3.76 (L) 03/11/2019 1600   HGB 11.0 (L) 03/11/2019 1600   HGB 11.1 12/04/2018 1643   HCT 33.6 (L) 03/11/2019 1600   HCT 32.5 (L) 12/04/2018 1643   PLT 346.0 03/11/2019 1600   PLT 332 12/04/2018 1643   MCV 89.5 03/11/2019 1600   MCV 86 12/04/2018 1643   MCH 29.4 12/04/2018 1643   MCH  28.4 09/19/2017 0346   MCHC 32.6 03/11/2019 1600   RDW 16.5 (H) 03/11/2019 1600   RDW 15.2 12/04/2018 1643   LYMPHSABS 0.9 03/11/2019 1600   MONOABS 0.4 03/11/2019 1600   EOSABS 0.1 03/11/2019 1600   BASOSABS 0.0 03/11/2019 1600  No significant anemia, but 2 g drop in hemoglobin since early 2019.  Review of multiple recent BMPs demonstrates low normal bicarb NT-proBNP 504 --> 235--> 717  IgE 825 (on 03/11/2019)   Chest Imaging- reports reviewed. (Images unavailable for personal review): Report from one view chest x-ray 07/04/2018 reviewed-intubated at that time with right lower lobe infiltrate. CT chest 06/2018 report reviewed- left upper lobe atelectasis, otherwise groundglass throughout the lungs.  CT chest 01/26/2019 (report only): no PE or signifciant adenopathy, HH is present. Bilateral UL scarring.  Residual atelectasis in the paramediastinal portion of the LUL, but overall improved atelectasis throughout the LUL.  Mild central proximal heel thickening.  CT chest 03/14/2019, images reviewed - airway thickening, borderline proximal bronchiectasis with normal peripheral tapering. Scars in the lingula and  right middle lobe.  Mild mucous plugging in the medial RML with distal atelectasis.  LLL nodule.  No significant air trapping.  Severe bronchomalacia, especially on the right with near total occlusion of right mainstem bronchus.  No mediastinal or hilar adenopathy.  Pulmonary Functions Testing Results: PFT Results Latest Ref Rng & Units 06/13/2019  FVC-Pre L 2.08  FVC-Predicted Pre % 64  FVC-Post L 2.27  FVC-Predicted Post % 70  Pre FEV1/FVC % % 73  Post FEV1/FCV % % 75  FEV1-Pre L 1.52  FEV1-Predicted Pre % 59  FEV1-Post L 1.70  DLCO UNC% % 91  DLCO COR %Predicted % 117  TLC L 4.15  TLC % Predicted % 90  RV % Predicted % 128     No significant obstruction with air trapping but no hyperinflation.  Mildly reduced FEV1 and FVC.  Normal diffusion.  High airway resistance.  No  significant bronchodilator response.  Flow volume loop suggests mixed obstruction and restriction.    Per outside records, reported reported spirometry 08/21/2018-no obstruction, possible restriction, FEV1 1L  Echocardiogram 02/10/2019: LVEF 60 to 65% with increased LV wall thickness but normal diastolic function. Normal RV, normal atria.  Normal valves.  Heart Catheterization 06/2018: Severe CAD of the proximal LAD treated with DES, otherwise minimal disease.    Extensive review of outside records that were available.  Reports pulled from her CPAP machine indicate compliance with its use on 70 to 80% of nights with a low AHI.  On nights when it was used it was for significantly greater than 4 hours.  Assessment & Plan:     ICD-10-CM   1. Seasonal allergic rhinitis, unspecified trigger  J30.2 Aspergillus IgE Panel    Allergen, A fumigatus IgG    Aspergillus Precipitins    CANCELED: Aspergillus Precipitins  2. Current chronic use of systemic steroids  Z79.52   3. Severe persistent asthma, unspecified whether complicated  J45.50 Aspergillus IgE Panel    Allergen, A fumigatus IgG    Aspergillus Precipitins    predniSONE (DELTASONE) 1 MG tablet    CANCELED: Aspergillus Precipitins     Severe steroid- dependent allergic asthma, question ABPA. Elevated IgE; normal eos (on prednisone).  PFTs with incomplete control. -Aspergillus IgE and IgG levels, Aspergillus precipitants -Continue attempting to taper prednisone -Continue high-dose Breo. -Start Spiriva once daily. -Restart Singulair once daily -Recommend restarting Claritin daily. -Patient qualifies for Dupixent with steroid-dependent asthma.  Despite being on tapering doses of steroids, she is still using her inhalers multiple days per week and has air trapping and partial reversibility with bronchodilators.  We discussed that these are given in the infusion clinic, and these medications are safer than frequently needing prednisone, which  she has had significant side effects from. -Continue albuterol as needed  Dyspnea on exertion- this is most certainly multifactorial due to anemia, asthma, and likely obesity.  Deconditioning is also potentially playing a role. -Agree with her vascular surgeon that a bariatric surgery consultation is appropriate.  Her asthma should be optimized prior to undergoing surgery. -Appreciate her hematologist managing her chronic anemia.  Allergic rhinosinusitis -Restart Singulair and Claritin  Chronic steroids with history of adrenal insufficiency when quickly weaned off -Slow taper as described above-we will transition from 2 mg daily to 1 mg daily for 2 weeks before stopping  RTC in 3 months.   Current Outpatient Medications:    albuterol (PROVENTIL) (2.5 MG/3ML) 0.083% nebulizer solution, Take 3 mLs (2.5 mg total) by nebulization every 6 (six) hours as  needed for wheezing or shortness of breath., Disp: 75 mL, Rfl: 12   albuterol (VENTOLIN HFA) 108 (90 Base) MCG/ACT inhaler, Inhale 2 puffs into the lungs every 4 (four) hours as needed for wheezing or shortness of breath., Disp: 14 g, Rfl: 11   allopurinol (ZYLOPRIM) 100 MG tablet, Take 50 mg by mouth daily., Disp: , Rfl:    ALPRAZolam (XANAX) 0.25 MG tablet, Take 1 tablet by mouth 3 (three) times daily as needed., Disp: , Rfl: 0   aspirin EC 81 MG EC tablet, Take 1 tablet (81 mg total) by mouth daily., Disp: , Rfl:    atorvastatin (LIPITOR) 80 MG tablet, Take 1 tablet (80 mg total) by mouth daily at 6 PM., Disp: 90 tablet, Rfl: 3   clopidogrel (PLAVIX) 75 MG tablet, TAKE 1 TABLET BY MOUTH ONCE DAILY WITH BREAKFAST., Disp: 90 tablet, Rfl: 1   fluticasone furoate-vilanterol (BREO ELLIPTA) 100-25 MCG/INH AEPB, Inhale 1 puff into the lungs daily., Disp: , Rfl:    fluticasone furoate-vilanterol (BREO ELLIPTA) 200-25 MCG/INH AEPB, Inhale 1 puff into the lungs daily., Disp: 1 each, Rfl: 11   ipratropium-albuterol (DUONEB) 0.5-2.5 (3) MG/3ML  SOLN, Take 3 mLs by nebulization every 6 (six) hours as needed (short of breath). , Disp: , Rfl:    metolazone (ZAROXOLYN) 2.5 MG tablet, Take 1 tablet (2.5 mg total) by mouth once a week. 30 min before morning dose of lasix., Disp: 30 tablet, Rfl: 0   metoprolol succinate (TOPROL-XL) 25 MG 24 hr tablet, TAKE 1/2 TABLET BY MOUTH ONCE DAILY. *TAKE WITH OR IMMEDIATELY FOLLOWING A MEAL*, Disp: 60 tablet, Rfl: 0   predniSONE (DELTASONE) 1 MG tablet, Take 4 tablets (4 mg total) by mouth daily with breakfast. Take 4mg  daily for 2 weeks, then 3 mg daily for 2 weeks, then 2 mg daily for 2 weeks, then stop., Disp: 150 tablet, Rfl: 1   SYNTHROID 175 MCG tablet, Take 1 tablet by mouth daily. , Disp: , Rfl:    torsemide (DEMADEX) 20 MG tablet, Take 1 tablet (20 mg total) by mouth 2 (two) times daily., Disp: 180 tablet, Rfl: 1   lisinopril (PRINIVIL,ZESTRIL) 2.5 MG tablet, Take 1 tablet by mouth daily., Disp: , Rfl:    sacubitril-valsartan (ENTRESTO) 24-26 MG, Take 1 tablet by mouth 2 (two) times daily., Disp: , Rfl:   Steffanie DunnLaura P Sakai Wolford, DO [facility] 06/13/2019 3:30 PM

## 2019-06-13 NOTE — Progress Notes (Signed)
Full PFT performed today. °

## 2019-06-16 ENCOUNTER — Telehealth: Payer: Self-pay | Admitting: Critical Care Medicine

## 2019-06-16 LAB — ALLERGEN A FUMIGATUS IGG: Aspergillus fumigatus (m3)IgG: 9.3 ug/mL — ABNORMAL HIGH (ref <2.0)

## 2019-06-16 NOTE — Telephone Encounter (Addendum)
I called Katie Rasmussen to let her know that her Aspergillus Ab level is high and likely her asthma being uncontrolled is due to the mold exposure in her home. She has had her entire HVAC system professionally cleaned in the past with the mold returning quickly. She reports that the mold is everywhere and comes out of the vents. She has aggressively cleaned her counters/ cabinets and everything she can to try to help with this. She has a humidifier running all the time.   Her breathing has been improving on ICS and she is tolerating decreasing doses of systemic steroids. Her steroid-related comorbidities necessitate attempts to get her off systemic steroids.   She has noticed wheezing the past 2 days with the addition of the Spiriva and wanted to know if that is what caused it. She has continued to use her Breo.  I instructed her to try stopping Spiriva for a few days to determine if there is an improvement in her symptoms.  If there is she should stop the Spiriva altogether.  IgE (on steroids) 825 on 03/11/2019 Aspergillus fumigatus IgG 9.3  Fungal precipitins pending  I let her know that the [facility] paperwork that she needs to fill out will be coming in the mail and should be subsequently mailed back to [place] to be submitted.  Julian Hy, DO 06/17/19 12:53 PM [facility]

## 2019-06-21 LAB — ASPERGILLUS PRECIPITINS
A.Fumigatus #1 Abs: NEGATIVE
Aspergillus Flavus Antibodies: NEGATIVE
Aspergillus Niger Antibodies: NEGATIVE
Aspergillus glaucus IgG: NEGATIVE
Aspergillus nidulans IgG: NEGATIVE
Aspergillus terreus IgG: NEGATIVE

## 2019-06-21 LAB — ASPERGILLUS IGE PANEL
A. Amstel/Glaucu Class Interp: 0
A. Flavus Class Interp: 0
A. Fumigatus Class Interp: 0
A. Nidulans Class Interp: 0
A. Niger Class Interp: 0
A. Versicolor Class Interp: 0
Aspergillus amstel/glaucu IgE*: <0.35 kU/L (ref <0.35)
Aspergillus flavus IgE: <0.35 kU/L (ref <0.35)
Aspergillus fumigatus IgE: <0.1 kU/L (ref <0.35)
Aspergillus nidulans IgE: <0.35 kU/L (ref <0.35)
Aspergillus niger IgE: <0.1 kU/L (ref <0.35)
Aspergillus versicolor IgE: <0.1 kU/L (ref <0.35)

## 2019-07-23 ENCOUNTER — Other Ambulatory Visit: Payer: Self-pay | Admitting: Critical Care Medicine

## 2019-07-23 DIAGNOSIS — J4550 Severe persistent asthma, uncomplicated: Secondary | ICD-10-CM

## 2019-08-22 ENCOUNTER — Other Ambulatory Visit: Payer: Self-pay

## 2019-08-22 ENCOUNTER — Encounter: Payer: Self-pay | Admitting: Cardiology

## 2019-08-22 ENCOUNTER — Ambulatory Visit (INDEPENDENT_AMBULATORY_CARE_PROVIDER_SITE_OTHER): Payer: BC Managed Care – PPO | Admitting: Cardiology

## 2019-08-22 VITALS — BP 132/80 | HR 80 | Temp 97.5°F | Ht 61.0 in | Wt 280.6 lb

## 2019-08-22 DIAGNOSIS — J42 Unspecified chronic bronchitis: Secondary | ICD-10-CM

## 2019-08-22 DIAGNOSIS — I255 Ischemic cardiomyopathy: Secondary | ICD-10-CM

## 2019-08-22 DIAGNOSIS — R0600 Dyspnea, unspecified: Secondary | ICD-10-CM | POA: Diagnosis not present

## 2019-08-22 DIAGNOSIS — R0609 Other forms of dyspnea: Secondary | ICD-10-CM

## 2019-08-22 DIAGNOSIS — I2510 Atherosclerotic heart disease of native coronary artery without angina pectoris: Secondary | ICD-10-CM

## 2019-08-22 DIAGNOSIS — E785 Hyperlipidemia, unspecified: Secondary | ICD-10-CM | POA: Diagnosis not present

## 2019-08-22 DIAGNOSIS — E662 Morbid (severe) obesity with alveolar hypoventilation: Secondary | ICD-10-CM
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
## 2019-08-22 NOTE — Patient Instructions (Signed)
Medication Instructions:  No medication changes *If you need a refill on your cardiac medications before your next appointment, please call your pharmacy*  Lab Work: None ordered If you have labs (blood work) drawn today and your tests are completely normal, you will receive your results only by: Marland Kitchen MyChart Message (if you have MyChart) OR . A paper copy in the mail If you have any lab test that is abnormal or we need to change your treatment, we will call you to review the results.  Testing/Procedures: None ordered  Follow-Up: At [facility], you and your health needs are our priority.  As part of our continuing mission to provide you with exceptional heart care, we have created designated Provider Care Teams.  These Care Teams include your primary Cardiologist (physician) and Advanced Practice Providers (APPs -  Physician Assistants and Nurse Practitioners) who all work together to provide you with the care you need, when you need it.  Your next appointment:   6 week(s)  The format for your next appointment:   In Person  Provider:   Gypsy Balsam, MD  Other Instructions NA

## 2019-08-26 DIAGNOSIS — D509 Iron deficiency anemia, unspecified: Secondary | ICD-10-CM

## 2019-08-28 ENCOUNTER — Other Ambulatory Visit: Payer: Self-pay | Admitting: Cardiology

## 2019-09-05 ENCOUNTER — Telehealth: Payer: Self-pay | Admitting: Cardiology

## 2019-09-05 NOTE — Telephone Encounter (Signed)
Left message for patient to return call.

## 2019-09-05 NOTE — Telephone Encounter (Signed)
Pt c/o BP issue: STAT if pt c/o blurred vision, one-sided weakness or slurred speech 1. What are your last 5 BP readings? 168/97  2. Are you having any other symptoms (ex. Dizziness, headache, blurred vision, passed out)? No   3. What is your BP issue? Patient BP going on

## 2019-09-08 NOTE — Telephone Encounter (Signed)
Called patient. She reports that she went to physical therapy on Friday and they told her she couldn't go to her appointment that day due to her blood pressure being high. It was 168/97 after walking across the parking lot which is hard for her to do. She reports her blood pressure today is 142/86 and she is still taking blood pressure medication as documented in chart. I have asked her to continue to monitor for now and let us know if she gets anymore consistent high readings. I also encouraged her to ask physical therapy office to allow her to sit for 5 minutes after coming into the building that way her blood pressure is accurate. She will take recommendations. I will also consult with Dr. Bing Matter if he has further recommendations.

## 2019-09-10 ENCOUNTER — Encounter: Payer: BC Managed Care – PPO | Admitting: Endocrinology

## 2019-09-10 ENCOUNTER — Encounter: Payer: Self-pay | Admitting: Endocrinology

## 2019-09-10 ENCOUNTER — Other Ambulatory Visit: Payer: Self-pay

## 2019-09-10 NOTE — Progress Notes (Signed)
   Review of Systems   This encounter was created in error - please disregard. 

## 2019-10-08 IMAGING — DX DG CHEST 1V PORT
1 series · 1 of 1 positions shown · non-contrast
Comparison: None.

CLINICAL DATA: Shortness of breath.  CHF.

EXAM:
PORTABLE CHEST 1 VIEW

[chest]
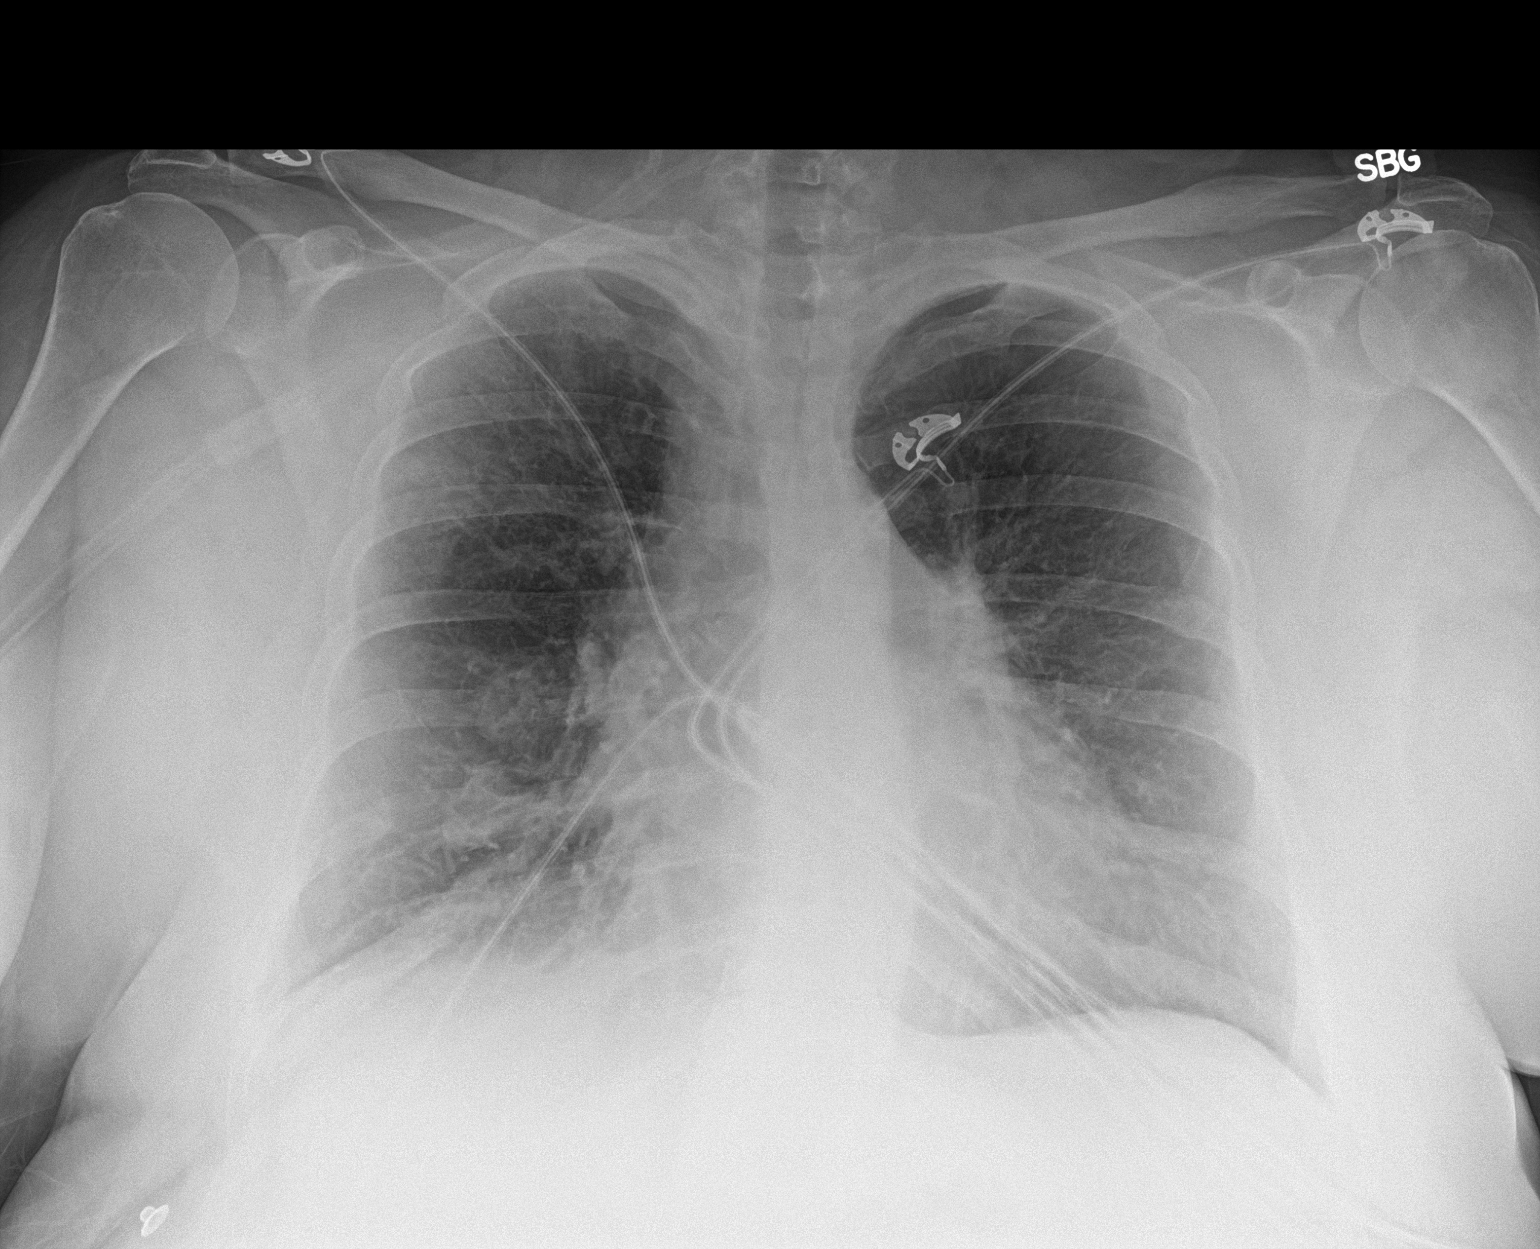

[1 of 1 positions shown; findings below may reference images not displayed]

FINDINGS: The heart size borderline. The hila and mediastinum are normal. No
pneumothorax. No nodules or masses. No overt edema. Mild atelectasis
in the bases.
IMPRESSION: No active disease.

## 2019-10-10 ENCOUNTER — Encounter: Payer: Self-pay | Admitting: Cardiology

## 2019-10-10 ENCOUNTER — Other Ambulatory Visit: Payer: Self-pay

## 2019-10-10 ENCOUNTER — Ambulatory Visit (INDEPENDENT_AMBULATORY_CARE_PROVIDER_SITE_OTHER): Payer: BC Managed Care – PPO | Admitting: Cardiology

## 2019-10-10 VITALS — BP 126/80 | HR 105 | Temp 97.8°F | Ht 61.0 in | Wt 274.0 lb

## 2019-10-10 DIAGNOSIS — I2510 Atherosclerotic heart disease of native coronary artery without angina pectoris: Secondary | ICD-10-CM

## 2019-10-10 DIAGNOSIS — E785 Hyperlipidemia, unspecified: Secondary | ICD-10-CM | POA: Diagnosis not present

## 2019-10-10 DIAGNOSIS — J42 Unspecified chronic bronchitis: Secondary | ICD-10-CM

## 2019-10-10 DIAGNOSIS — I255 Ischemic cardiomyopathy: Secondary | ICD-10-CM | POA: Diagnosis not present

## 2019-10-10 IMAGING — CT CT HEAD W/O CM
3 series · 16 of 47 positions shown, 19 images · non-contrast
Comparison: None.

CLINICAL DATA: 47-year-old female with history of dizziness and
headache for 2 days.

EXAM:
CT HEAD WITHOUT CONTRAST
TECHNIQUE: Contiguous axial images were obtained from the base of the skull
through the vertex without intravenous contrast.

[Series 3: head 5.0 h30s · axial · 0.39mm/px · z∈[-187,-57]mm · 10 of 32 slices shown, 13 images]
[im 3/32  brain]
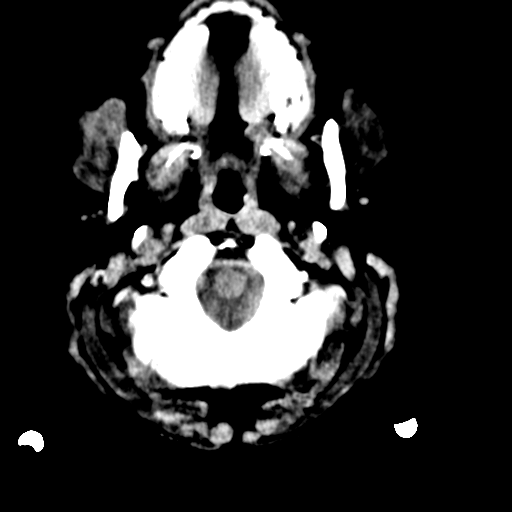
[im 3/32  bone]
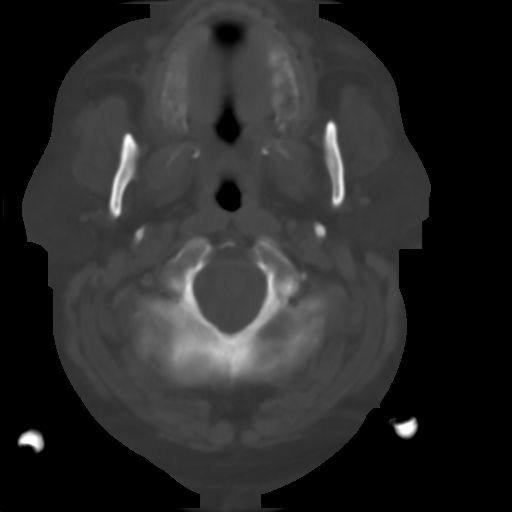
[im 6/32  brain]
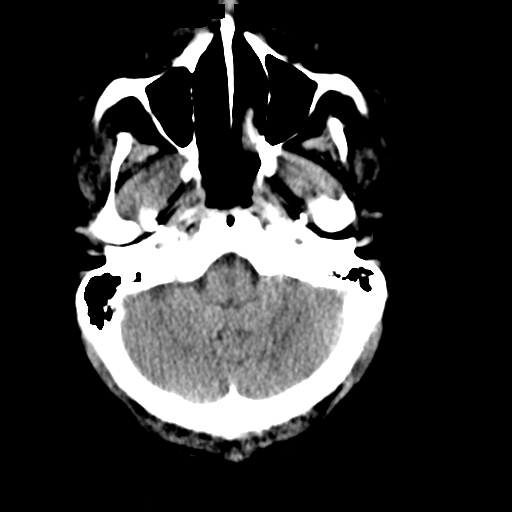
[im 9/32  brain]
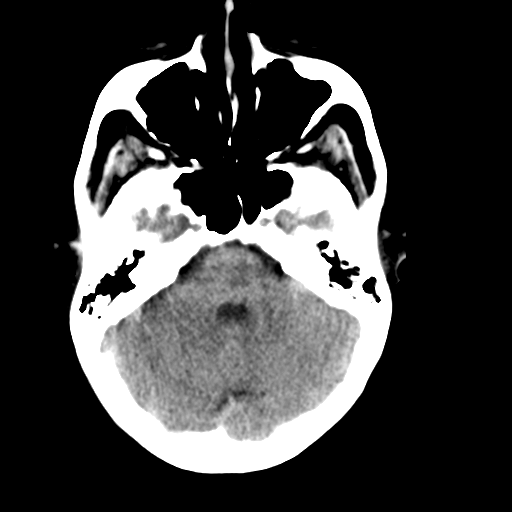
[im 11/32  brain]
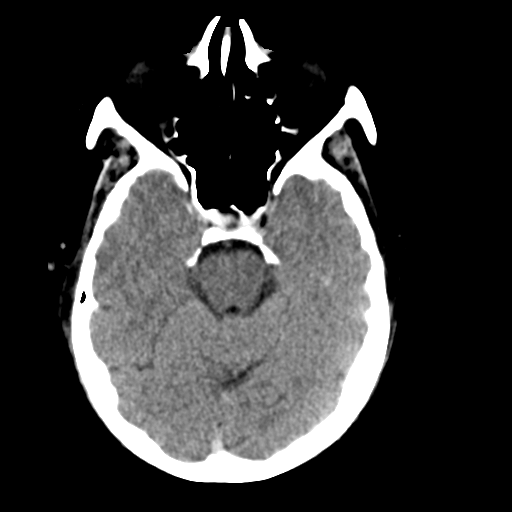
[im 14/32  brain]
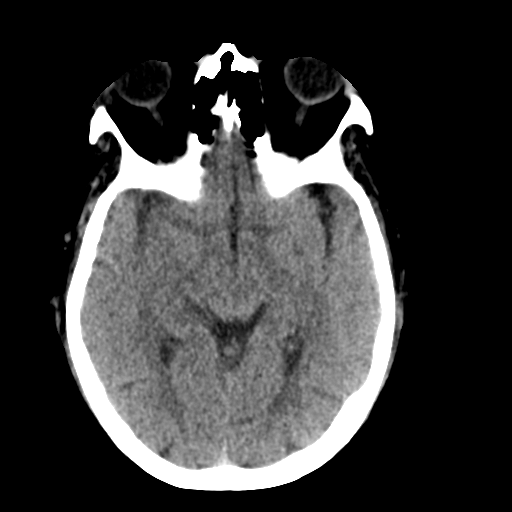
[im 14/32  bone]
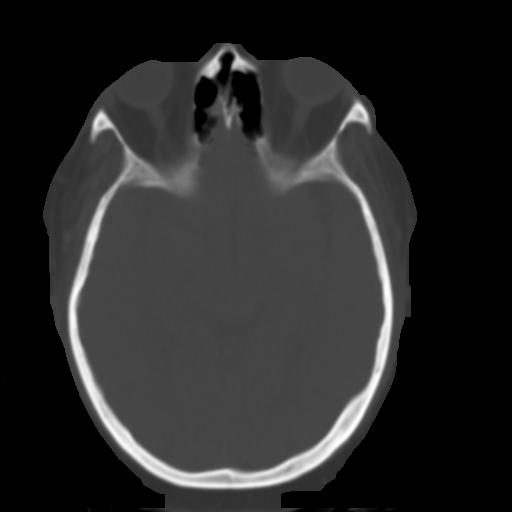
[im 18/32  brain]
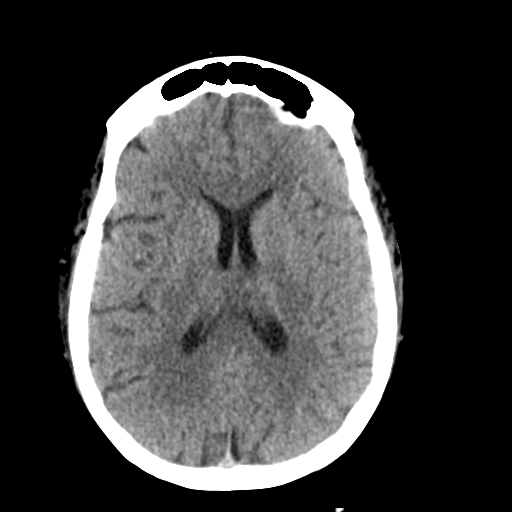
[im 21/32  brain]
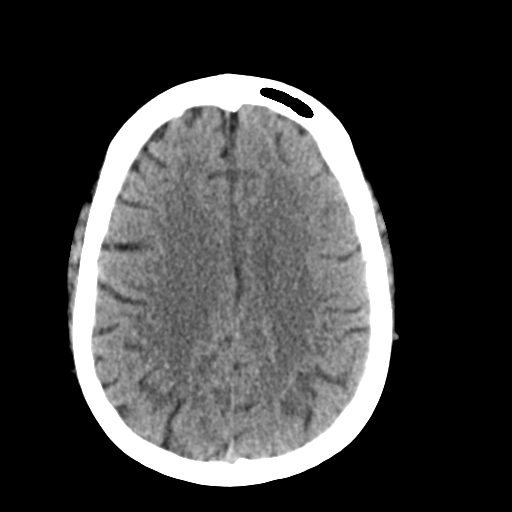
[im 24/32  brain]
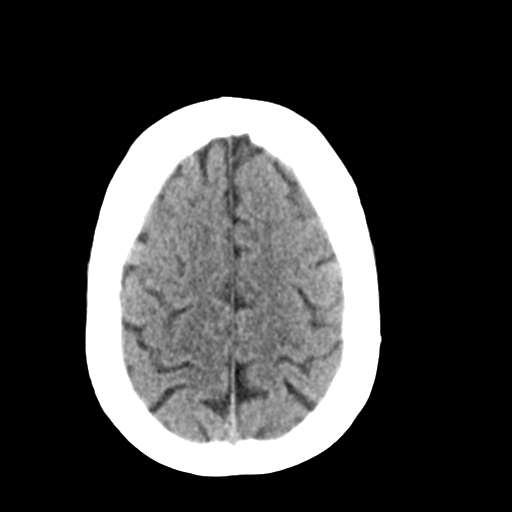
[im 26/32  brain]
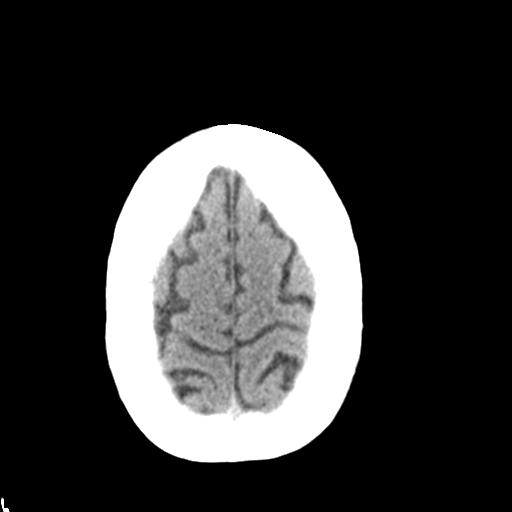
[im 26/32  bone]
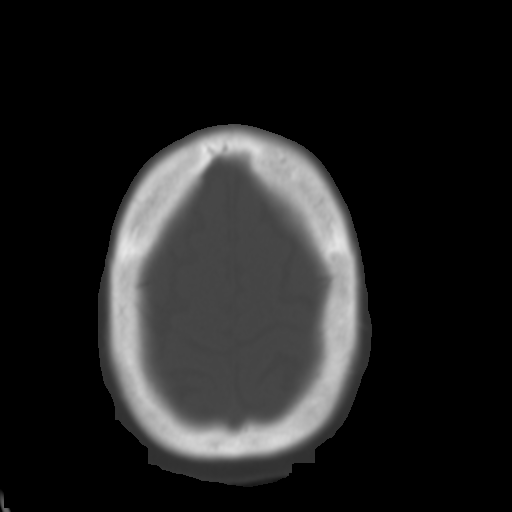
[im 29/32  brain]
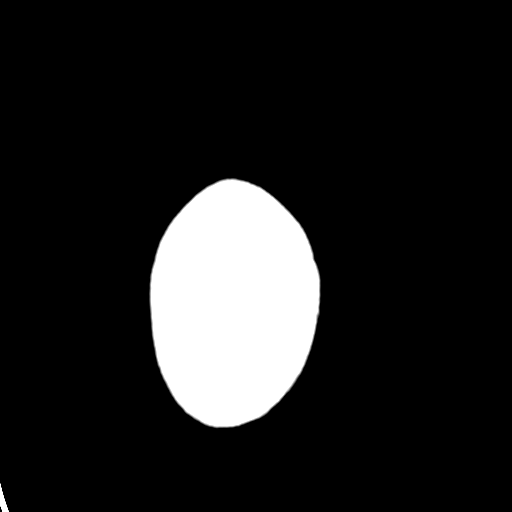

[Series 5: head 3.0 mpr cor · coronal · 0.29mm/px · 3 of 67 slices shown]
[im 23/67  brain]
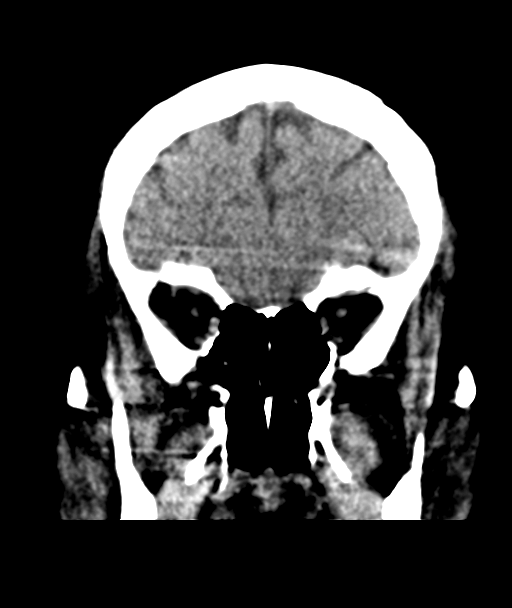
[im 30/67  brain]
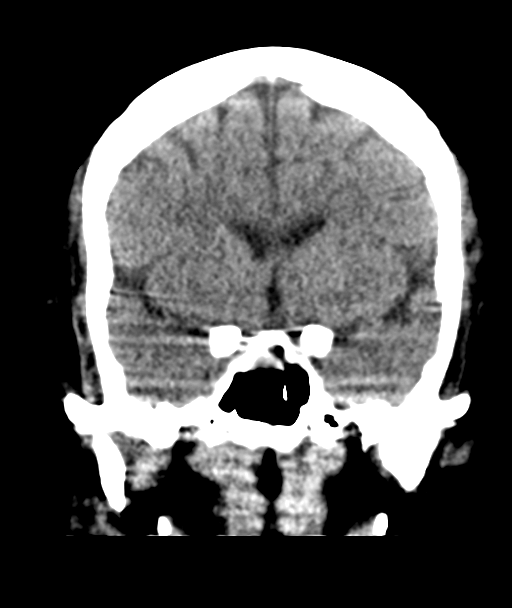
[im 37/67  brain]
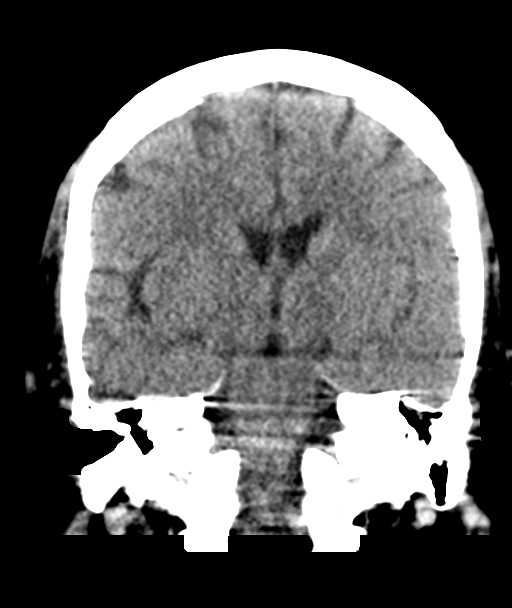

[Series 6: head 3.0 mpr sag · sagittal · 0.36mm/px · 3 of 67 slices shown]
[im 23/67  brain]
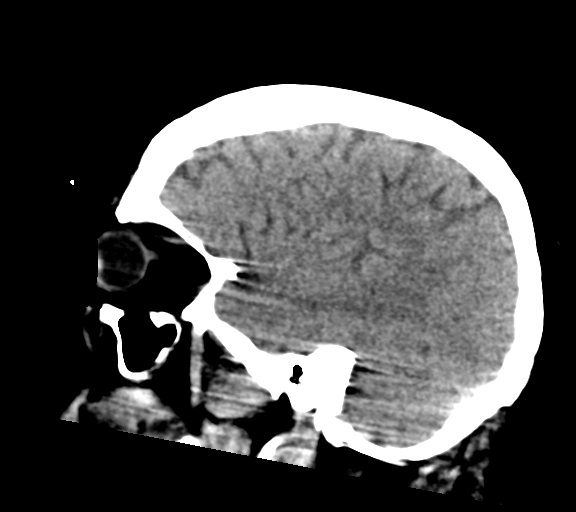
[im 34/67  brain]
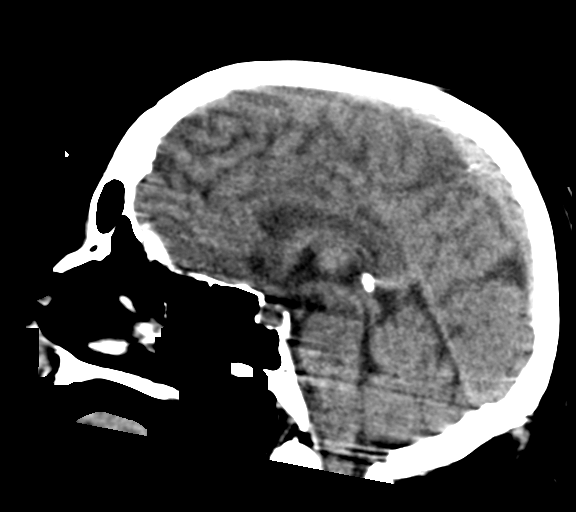
[im 45/67  brain]
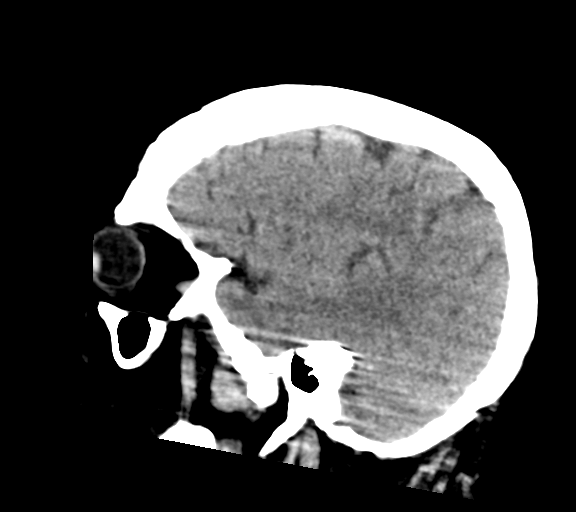

[16 of 47 positions shown; findings below may reference images not displayed]

FINDINGS: Brain: No evidence of acute infarction, hemorrhage, hydrocephalus,
extra-axial collection or mass lesion/mass effect.

Vascular: No hyperdense vessel or unexpected calcification.

Skull: Normal. Negative for fracture or focal lesion.

Sinuses/Orbits: No acute finding. Small mucosal retention cyst
versus polyp in the lateral aspect of the right maxillary sinus
incidentally noted.

Other: None.
IMPRESSION: 1. No acute intracranial abnormalities. The appearance of the brain
is normal.

## 2019-10-10 MED ORDER — ALPRAZOLAM 0.25 MG PO TABS: 0.2500 mg | ORAL_TABLET | Freq: Three times a day (TID) | ORAL | 0 refills | Status: DC

## 2019-10-10 NOTE — Progress Notes (Signed)
Cardiology Office Note:    Date:  10/10/2019   ID:  Katie Rasmussen, DOB [birth year], MRN [id number]  PCP:  Clancy Gourd, NP  Cardiologist:  Jenne Campus, MD    Referring MD: Clancy Gourd, NP   No chief complaint on file. I have called  History of Present Illness:    Katie Rasmussen is a 49 y.o. female with past medical history significant for coronary artery disease in March 2019 she had PTCA and stenting done to proximal LAD with drug-eluting stent at that time her ejection fraction was diminished 35 to 40% since that time she improved quite significantly with latest estimation of ejection fraction being normal from 2020.  She does have chronic lung disease requiring multiple steroids.  Now being managed excellently by pulmonary team and doing quite well from respiratory point review.  She comes today to my office and she cries when she cries because she is concluded after she has been normal.  She complained of chronic cough but nothing done about it.  She would like to come back to Dr. Delena Bali who has been her doctor for last 20 years.  Past Medical History:  Diagnosis Date  . Acute combined systolic and diastolic CHF, NYHA class 3 ([place])   . Bronchitis due to tobacco use 08/2017  . Chronic respiratory failure ([place])   . CKD (chronic kidney disease), stage III   . COPD (chronic obstructive pulmonary disease) ([place])   . Coronary artery disease   . Diastolic CHF ([place])   . Gout   . Hypothyroidism    h/o Grave's, s/p thyroidectomy & RAI in 1990s  . Obesity hypoventilation syndrome ([place])   . Pneumonia 08/2017  . Systolic CHF with reduced left ventricular function, NYHA class 3 ([place]) 09/13/2017    Past Surgical History:  Procedure Laterality Date  . CESAREAN SECTION    . CORONARY STENT INTERVENTION N/A 09/18/2017   Procedure: CORONARY STENT INTERVENTION;  Surgeon: Sherren Mocha, MD;  Location: [place] CV LAB;  Service: Cardiovascular;   Laterality: N/A;  prox LAD  . LEFT HEART CATH AND CORONARY ANGIOGRAPHY N/A 09/18/2017   Procedure: LEFT HEART CATH AND CORONARY ANGIOGRAPHY;  Surgeon: Sherren Mocha, MD;  Location: [place] CV LAB;  Service: Cardiovascular;  Laterality: N/A;  . THYROID SURGERY     s/p RAI for Grave's    Current Medications: Current Meds  Medication Sig  . albuterol (PROVENTIL) (2.5 MG/3ML) 0.083% nebulizer solution Take 3 mLs (2.5 mg total) by nebulization every 6 (six) hours as needed for wheezing or shortness of breath.  Marland Kitchen albuterol (VENTOLIN HFA) 108 (90 Base) MCG/ACT inhaler Inhale 2 puffs into the lungs every 4 (four) hours as needed for wheezing or shortness of breath.  . allopurinol (ZYLOPRIM) 100 MG tablet Take 50 mg by mouth daily.  Marland Kitchen ALPRAZolam (XANAX) 0.25 MG tablet Take 1 tablet by mouth 3 (three) times daily as needed.  Marland Kitchen aspirin EC 81 MG EC tablet Take 1 tablet (81 mg total) by mouth daily.  Marland Kitchen atorvastatin (LIPITOR) 80 MG tablet Take 1 tablet (80 mg total) by mouth daily at 6 PM.  . clopidogrel (PLAVIX) 75 MG tablet TAKE 1 TABLET BY MOUTH ONCE DAILY WITH BREAKFAST.  . fluticasone furoate-vilanterol (BREO ELLIPTA) 100-25 MCG/INH AEPB Inhale 1 puff into the lungs daily.  . fluticasone furoate-vilanterol (BREO ELLIPTA) 200-25 MCG/INH AEPB Inhale 1 puff into the lungs daily.  Marland Kitchen ipratropium-albuterol (DUONEB) 0.5-2.5 (3) MG/3ML SOLN Take 3 mLs by nebulization every  6 (six) hours as needed (short of breath).   Marland Kitchen lisinopril (PRINIVIL,ZESTRIL) 2.5 MG tablet Take 1 tablet by mouth daily.  . metoprolol succinate (TOPROL-XL) 25 MG 24 hr tablet TAKE 1/2 TABLET BY MOUTH ONCE DAILY, TAKE WITH OR IMMEDIATELY FOLLOWING A MEAL.  . montelukast (SINGULAIR) 10 MG tablet Take 1 tablet (10 mg total) by mouth at bedtime.  . sacubitril-valsartan (ENTRESTO) 24-26 MG Take 1 tablet by mouth 2 (two) times daily.  Marland Kitchen SYNTHROID 175 MCG tablet Take 1 tablet by mouth daily.   Marland Kitchen tiotropium (SPIRIVA) 18 MCG inhalation  capsule Place 1 capsule (18 mcg total) into inhaler and inhale daily.     Allergies:   Iodine, Lovenox [enoxaparin], and Shellfish allergy   Social History   Socioeconomic History  . Marital status: Married    Spouse name: Not on file  . Number of children: 3  . Years of education: Not on file  . Highest education level: Not on file  Occupational History  . 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 Smoking status: Former Smoker    Packs/day: 0.50    Years: 14.00    Pack years: 7.00    Types: Cigarettes    Quit date: 09/12/2017    Years since quitting: 2.0  . Smokeless tobacco: Never Used  Substance and Sexual Activity  . Alcohol use: No  . Drug use: No  . Sexual activity: Not on file  Other Topics Concern  . Not on file  Social History Narrative  . Not on file   Social Determinants of Health   Financial Resource Strain:   . Difficulty of Paying Living Expenses:   Food Insecurity:   . Worried About Programme researcher, broadcasting/film/video in the Last Year:   . Barista in the Last Year:   Transportation Needs:   . Freight forwarder (Medical):   Marland Kitchen Lack of Transportation (Non-Medical):   Physical Activity:   . Days of Exercise per Week:   . Minutes of Exercise per Session:   Stress:   . Feeling of Stress :   Social Connections:   . Frequency of Communication with Friends and Family:   . Frequency of Social Gatherings with Friends and Family:   . Attends Religious Services:   . Active Member of Clubs or Organizations:   . Attends Banker Meetings:   Marland Kitchen Marital Status:      Family History: The patient's family history includes Diabetes in her father; Heart disease in her father and mother; Hypertension in her mother. ROS:   Please see the history of present illness.    All 14 point review of systems negative except as described per history of present illness  EKGs/Labs/Other Studies Reviewed:      Recent Labs: 03/07/2019: ALT  17 03/11/2019: Hemoglobin 11.0; Platelets 346.0 04/18/2019: NT-Pro BNP 717 04/25/2019: BUN 13; Creatinine, Ser 1.52; Potassium 3.8; Sodium 140 05/06/2019: TSH 1.950  Recent Lipid Panel No results found for: CHOL, TRIG, HDL, CHOLHDL, VLDL, LDLCALC, LDLDIRECT  Physical Exam:    VS:  BP 126/80   Pulse (!) 105   Temp 97.8 F (36.6 C)   Ht 5\' 1"  (1.549 m)   Wt 274 lb (124.3 kg)   SpO2 97%   BMI 51.77 kg/m     Wt Readings from Last 3 Encounters:  10/10/19 274 lb (124.3 kg)  08/22/19 280 lb 9.6 oz (127.3 kg)  06/13/19 277 lb (125.6 kg)  GEN:  Well nourished, well developed in no acute distress HEENT: Normal NECK: No JVD; No carotid bruits LYMPHATICS: No lymphadenopathy CARDIAC: RRR, no murmurs, no rubs, no gallops RESPIRATORY:  Clear to auscultation without rales, few wheezes ABDOMEN: Soft, non-tender, non-distended MUSCULOSKELETAL:  No edema; No deformity  SKIN: Warm and dry LOWER EXTREMITIES: no swelling NEUROLOGIC:  Alert and oriented x 3 PSYCHIATRIC:  Normal affect   ASSESSMENT:    1. Coronary artery disease involving native coronary artery of native heart without angina pectoris   2. Ischemic cardiomyopathy initial ejection fraction 35 to 40% in March 2019, normalization of ejection fraction based on echocardiogram from 2020   3. Chronic bronchitis, unspecified chronic bronchitis type (HCC)   4. Dyslipidemia    PLAN:    In order of problems listed above:  1. Coronary disease seems to be stable from that point review denies having any chest pain tightness squeezing pressure burning chest. 2. Ischemic cardiomyopathy last estimation of ejection fraction was normal.  She is on numerous medication which I will continue.  I will do complete metabolic panel CBC probably A1c today. 3. Chronic bronchitis: Not being managed by pulmonary team.  Good improvement. 4. Dyslipidemia: We will check her fasting lipid profile  I will contact her primary physician and ask him to  accept her back as outpatient.   Medication Adjustments/Labs and Tests Ordered: Current medicines are reviewed at length with the patient today.  Concerns regarding medicines are outlined above.  No orders of the defined types were placed in this encounter.  Medication changes: No orders of the defined types were placed in this encounter.   Signed, Georgeanna Lea, MD, [facility] 10/10/2019 3:20 PM    [facility]

## 2019-10-10 NOTE — Patient Instructions (Addendum)
Medication Instructions:  We have sent in a prescription for Xanax every 8 hours as needed   *If you need a refill on your cardiac medications before your next appointment, please call your pharmacy*   Lab Work: Cmp, Cbc, Pro Bnp, HgB A1C  If you have labs (blood work) drawn today and your tests are completely normal, you will receive your results only by: Marland Kitchen MyChart Message (if you have MyChart) OR . A paper copy in the mail If you have any lab test that is abnormal or we need to change your treatment, we will call you to review the results.   Testing/Procedures: None ordered    Follow-Up: At [facility], you and your health needs are our priority.  As part of our continuing mission to provide you with exceptional heart care, we have created designated Provider Care Teams.  These Care Teams include your primary Cardiologist (physician) and Advanced Practice Providers (APPs -  Physician Assistants and Nurse Practitioners) who all work together to provide you with the care you need, when you need it.  We recommend signing up for the patient portal called "MyChart".  Sign up information is provided on this After Visit Summary.  MyChart is used to connect with patients for Virtual Visits (Telemedicine).  Patients are able to view lab/test results, encounter notes, upcoming appointments, etc.  Non-urgent messages can be sent to your provider as well.   To learn more about what you can do with MyChart, go to [email or web address].    Your next appointment:   2 month(s)  The format for your next appointment:   In Person  Provider:   Gypsy Balsam, MD   Other Instructions None

## 2019-10-11 LAB — COMPREHENSIVE METABOLIC PANEL
ALT: 14 IU/L (ref 0–32)
AST: 24 IU/L (ref 0–40)
Albumin/Globulin Ratio: 1.5 (ref 1.2–2.2)
Albumin: 4 g/dL (ref 3.8–4.8)
Alkaline Phosphatase: 113 IU/L (ref 39–117)
BUN/Creatinine Ratio: 10 (ref 9–23)
BUN: 15 mg/dL (ref 6–24)
Bilirubin Total: 0.2 mg/dL (ref 0.0–1.2)
CO2: 24 mmol/L (ref 20–29)
Calcium: 9.8 mg/dL (ref 8.7–10.2)
Chloride: 97 mmol/L (ref 96–106)
Creatinine, Ser: 1.55 mg/dL — ABNORMAL HIGH (ref 0.57–1.00)
GFR calc Af Amer: 45 mL/min/{1.73_m2} — ABNORMAL LOW (ref >59)
GFR calc non Af Amer: 39 mL/min/{1.73_m2} — ABNORMAL LOW (ref >59)
Globulin, Total: 2.7 g/dL (ref 1.5–4.5)
Glucose: 132 mg/dL — ABNORMAL HIGH (ref 65–99)
Potassium: 4.2 mmol/L (ref 3.5–5.2)
Sodium: 140 mmol/L (ref 134–144)
Total Protein: 6.7 g/dL (ref 6.0–8.5)

## 2019-10-11 LAB — CBC
Hematocrit: 36.2 % (ref 34.0–46.6)
Hemoglobin: 12.2 g/dL (ref 11.1–15.9)
MCH: 31 pg (ref 26.6–33.0)
MCHC: 33.7 g/dL (ref 31.5–35.7)
MCV: 92 fL (ref 79–97)
Platelets: 351 10*3/uL (ref 150–450)
RBC: 3.94 x10E6/uL (ref 3.77–5.28)
RDW: 12.2 % (ref 11.7–15.4)
WBC: 11.4 10*3/uL — ABNORMAL HIGH (ref 3.4–10.8)

## 2019-10-11 LAB — HEMOGLOBIN A1C
Est. average glucose Bld gHb Est-mCnc: 120 mg/dL
Hgb A1c MFr Bld: 5.8 % — ABNORMAL HIGH (ref 4.8–5.6)

## 2019-10-11 LAB — PRO B NATRIURETIC PEPTIDE: NT-Pro BNP: 536 pg/mL — ABNORMAL HIGH (ref 0–249)

## 2019-10-13 ENCOUNTER — Encounter: Payer: Self-pay | Admitting: Endocrinology

## 2019-10-13 ENCOUNTER — Encounter: Payer: BC Managed Care – PPO | Admitting: Endocrinology

## 2019-10-13 ENCOUNTER — Other Ambulatory Visit: Payer: Self-pay

## 2019-10-13 NOTE — Progress Notes (Signed)
This encounter was created in error - please disregard.

## 2019-11-10 ENCOUNTER — Other Ambulatory Visit: Payer: Self-pay | Admitting: Cardiology

## 2019-12-10 ENCOUNTER — Encounter: Payer: Self-pay | Admitting: Cardiology

## 2019-12-10 ENCOUNTER — Other Ambulatory Visit: Payer: Self-pay

## 2019-12-10 ENCOUNTER — Ambulatory Visit (INDEPENDENT_AMBULATORY_CARE_PROVIDER_SITE_OTHER): Payer: BC Managed Care – PPO | Admitting: Cardiology

## 2019-12-10 VITALS — BP 142/72 | HR 104 | Ht 61.0 in | Wt 267.0 lb

## 2019-12-10 DIAGNOSIS — J42 Unspecified chronic bronchitis: Secondary | ICD-10-CM

## 2019-12-10 DIAGNOSIS — I2510 Atherosclerotic heart disease of native coronary artery without angina pectoris: Secondary | ICD-10-CM | POA: Diagnosis not present

## 2019-12-10 DIAGNOSIS — E785 Hyperlipidemia, unspecified: Secondary | ICD-10-CM

## 2019-12-10 DIAGNOSIS — I255 Ischemic cardiomyopathy: Secondary | ICD-10-CM | POA: Diagnosis not present

## 2019-12-10 NOTE — Patient Instructions (Signed)

## 2019-12-10 NOTE — Progress Notes (Signed)
Cardiology Office Note:    Date:  12/10/2019   ID:  Katie Rasmussen, DOB 04/29/1971, MRN [id number] Doing much better PCP:  Tamsen Snider, NP  Cardiologist:  Gypsy Balsam, MD    Referring MD: Tamsen Snider, NP   Chief Complaint  Patient presents with  . Follow-up    2 MO FU   Doing much better  History of Present Illness:    Katie Rasmussen is a 49 y.o. female with past medical history significant for coronary artery disease.  In March 2019 she had stent to proximal LAD which was drug-eluting at that time her ejection fraction was diminished 35 to 40% however after that she improved quite significantly with latest echocardiogram from 2020 showed ejection fraction of 55%.  She does have chronic respiratory problem which is multifactorial.  She was recently discovered to have multiple arteries and she is under excellent care by pulmonary team.  She was find to have black mold in her home.  She actually moved to different place and she is feeling dramatically better she feels better.  Described to have some exertional shortness of breath but have more energy is able to do more.  Denies have any chest pain tightness squeezing pressure burning chest   Past Medical History:  Diagnosis Date  . Acute combined systolic and diastolic CHF, NYHA class 3 (HCC)   . Bronchitis due to tobacco use 08/2017  . Chronic respiratory failure (HCC)   . CKD (chronic kidney disease), stage III   . COPD (chronic obstructive pulmonary disease) (HCC)   . Coronary artery disease   . Diastolic CHF (HCC)   . Gout   . Hypothyroidism    h/o Grave's, s/p thyroidectomy & RAI in 1990s  . Obesity hypoventilation syndrome (HCC)   . Pneumonia 08/2017  . Systolic CHF with reduced left ventricular function, NYHA class 3 (HCC) 09/13/2017    Past Surgical History:  Procedure Laterality Date  . CESAREAN SECTION    . CORONARY STENT INTERVENTION N/A 09/18/2017   Procedure: CORONARY STENT  INTERVENTION;  Surgeon: Tonny Bollman, MD;  Location: [facility] INVASIVE CV LAB;  Service: Cardiovascular;  Laterality: N/A;  prox LAD  . LEFT HEART CATH AND CORONARY ANGIOGRAPHY N/A 09/18/2017   Procedure: LEFT HEART CATH AND CORONARY ANGIOGRAPHY;  Surgeon: Tonny Bollman, MD;  Location: [facility] INVASIVE CV LAB;  Service: Cardiovascular;  Laterality: N/A;  . THYROID SURGERY     s/p RAI for Grave's    Current Medications: Current Meds  Medication Sig  . albuterol (PROVENTIL) (2.5 MG/3ML) 0.083% nebulizer solution Take 3 mLs (2.5 mg total) by nebulization every 6 (six) hours as needed for wheezing or shortness of breath.  Marland Kitchen albuterol (VENTOLIN HFA) 108 (90 Base) MCG/ACT inhaler Inhale 2 puffs into the lungs every 4 (four) hours as needed for wheezing or shortness of breath.  . allopurinol (ZYLOPRIM) 100 MG tablet Take 50 mg by mouth daily.  Marland Kitchen ALPRAZolam (XANAX) 0.25 MG tablet Take 1 tablet (0.25 mg total) by mouth every 8 (eight) hours. (Patient taking differently: Take 0.25 mg by mouth as needed. )  . aspirin EC 81 MG EC tablet Take 1 tablet (81 mg total) by mouth daily.  . clopidogrel (PLAVIX) 75 MG tablet TAKE 1 TABLET BY MOUTH ONCE DAILY WITH BREAKFAST.  . fluticasone furoate-vilanterol (BREO ELLIPTA) 100-25 MCG/INH AEPB Inhale 1 puff into the lungs daily.  . fluticasone furoate-vilanterol (BREO ELLIPTA) 200-25 MCG/INH AEPB Inhale 1 puff into the lungs daily.  Marland Kitchen  ipratropium-albuterol (DUONEB) 0.5-2.5 (3) MG/3ML SOLN Take 3 mLs by nebulization every 6 (six) hours as needed (short of breath).   . metoprolol succinate (TOPROL-XL) 25 MG 24 hr tablet TAKE 1/2 TABLET BY MOUTH ONCE DAILY, TAKE WITH OR IMMEDIATELY FOLLOWING A MEAL.  Marland Kitchen SYNTHROID 175 MCG tablet Take 1 tablet by mouth daily.   Marland Kitchen tiotropium (SPIRIVA) 18 MCG inhalation capsule Place 1 capsule (18 mcg total) into inhaler and inhale daily.  Marland Kitchen torsemide (DEMADEX) 20 MG tablet Take 1 tablet (20 mg total) by mouth 2 (two) times daily.     Allergies:    Iodine, Lovenox [enoxaparin], and Shellfish allergy   Social History   Socioeconomic History  . Marital status: Married    Spouse name: Not on file  . Number of children: 3  . Years of education: Not on file  . Highest education level: Not on file  Occupational History  . 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 Smoking status: Former Smoker    Packs/day: 0.50    Years: 14.00    Pack years: 7.00    Types: Cigarettes    Quit date: 09/12/2017    Years since quitting: 2.2  . Smokeless tobacco: Never Used  Substance and Sexual Activity  . Alcohol use: No  . Drug use: No  . Sexual activity: Not on file  Other Topics Concern  . Not on file  Social History Narrative  . Not on file   Social Determinants of Health   Financial Resource Strain:   . Difficulty of Paying Living Expenses:   Food Insecurity:   . Worried About Programme researcher, broadcasting/film/video in the Last Year:   . Barista in the Last Year:   Transportation Needs:   . Freight forwarder (Medical):   Marland Kitchen Lack of Transportation (Non-Medical):   Physical Activity:   . Days of Exercise per Week:   . Minutes of Exercise per Session:   Stress:   . Feeling of Stress :   Social Connections:   . Frequency of Communication with Friends and Family:   . Frequency of Social Gatherings with Friends and Family:   . Attends Religious Services:   . Active Member of Clubs or Organizations:   . Attends Banker Meetings:   Marland Kitchen Marital Status:      Family History: The patient's family history includes Diabetes in her father; Heart disease in her father and mother; Hypertension in her mother. ROS:   Please see the history of present illness.    All 14 point review of systems negative except as described per history of present illness  EKGs/Labs/Other Studies Reviewed:    EKG today showed normal sinus rhythm, right axis, low voltage QRS, cannot rule out anteroseptal wall MI.  Recent  Labs: 05/06/2019: TSH 1.950 10/10/2019: ALT 14; BUN 15; Creatinine, Ser 1.55; Hemoglobin 12.2; NT-Pro BNP 536; Platelets 351; Potassium 4.2; Sodium 140  Recent Lipid Panel No results found for: CHOL, TRIG, HDL, CHOLHDL, VLDL, LDLCALC, LDLDIRECT  Physical Exam:    VS:  BP (!) 142/72   Pulse (!) 104   Ht 5\' 1"  (1.549 m)   Wt 267 lb (121.1 kg)   SpO2 95%   BMI 50.45 kg/m     Wt Readings from Last 3 Encounters:  12/10/19 267 lb (121.1 kg)  10/10/19 274 lb (124.3 kg)  08/22/19 280 lb 9.6 oz (127.3 kg)     GEN:  Well nourished,  well developed in no acute distress HEENT: Normal NECK: No JVD; No carotid bruits LYMPHATICS: No lymphadenopathy CARDIAC: RRR, no murmurs, no rubs, no gallops RESPIRATORY:  Clear to auscultation without rales, wheezing or rhonchi  ABDOMEN: Soft, non-tender, non-distended MUSCULOSKELETAL:  No edema; No deformity  SKIN: Warm and dry LOWER EXTREMITIES: no swelling NEUROLOGIC:  Alert and oriented x 3 PSYCHIATRIC:  Normal affect   ASSESSMENT:    1. Ischemic cardiomyopathy initial ejection fraction 35 to 40% in March 2019, normalization of ejection fraction based on echocardiogram from 2020   2. Coronary artery disease involving native coronary artery of native heart without angina pectoris   3. Chronic bronchitis, unspecified chronic bronchitis type ([place])   4. Dyslipidemia    PLAN:    In order of problems listed above:  1. Ischemic cardiomyopathy with normalization.  We will continue present management.  Her echocardiogram will be repeated in August.  She still does some shortness of breath but overall significantly improved. 2. History of cardiomyopathy again with normalization on appropriate medication which I will continue. 3. Chronic bronchitis followed by pulmonary team. 4. Dyslipidemia: She is on Lipitor 80 which I will continue.  I do have her last fasting lipid profile and K PN from year ago.  We will make arrangements for test to be  redone.   Medication Adjustments/Labs and Tests Ordered: Current medicines are reviewed at length with the patient today.  Concerns regarding medicines are outlined above.  No orders of the defined types were placed in this encounter.  Medication changes: No orders of the defined types were placed in this encounter.   Signed, Park Liter, MD, [facility] 12/10/2019 3:38 PM    [place]

## 2020-02-13 ENCOUNTER — Other Ambulatory Visit: Payer: Self-pay | Admitting: Sports Medicine

## 2020-02-13 MED ORDER — SULFAMETHOXAZOLE-TRIMETHOPRIM 400-80 MG PO TABS: 1.0000 | ORAL_TABLET | Freq: Two times a day (BID) | ORAL | 0 refills | Status: DC

## 2020-02-13 NOTE — Progress Notes (Signed)
Sent Bactrim for redness and swelling , was dx with Gout by ER and PCP but has not improved with Prednisone. Patient to see me in office next week for follow up

## 2020-02-18 ENCOUNTER — Other Ambulatory Visit: Payer: Self-pay

## 2020-02-18 ENCOUNTER — Ambulatory Visit (INDEPENDENT_AMBULATORY_CARE_PROVIDER_SITE_OTHER): Payer: BC Managed Care – PPO | Admitting: Sports Medicine

## 2020-02-18 ENCOUNTER — Encounter: Payer: Self-pay | Admitting: Sports Medicine

## 2020-02-18 ENCOUNTER — Other Ambulatory Visit: Payer: Self-pay | Admitting: Sports Medicine

## 2020-02-18 ENCOUNTER — Ambulatory Visit (INDEPENDENT_AMBULATORY_CARE_PROVIDER_SITE_OTHER): Payer: BC Managed Care – PPO

## 2020-02-18 DIAGNOSIS — M779 Enthesopathy, unspecified: Secondary | ICD-10-CM

## 2020-02-18 DIAGNOSIS — M069 Rheumatoid arthritis, unspecified: Secondary | ICD-10-CM

## 2020-02-18 DIAGNOSIS — E8729 Other acidosis: Secondary | ICD-10-CM | POA: Insufficient documentation

## 2020-02-18 DIAGNOSIS — J45901 Unspecified asthma with (acute) exacerbation: Secondary | ICD-10-CM | POA: Insufficient documentation

## 2020-02-18 DIAGNOSIS — D72829 Elevated white blood cell count, unspecified: Secondary | ICD-10-CM | POA: Insufficient documentation

## 2020-02-18 DIAGNOSIS — J069 Acute upper respiratory infection, unspecified: Secondary | ICD-10-CM | POA: Insufficient documentation

## 2020-02-18 DIAGNOSIS — G4733 Obstructive sleep apnea (adult) (pediatric): Secondary | ICD-10-CM | POA: Insufficient documentation

## 2020-02-18 DIAGNOSIS — E872 Acidosis: Secondary | ICD-10-CM | POA: Insufficient documentation

## 2020-02-18 DIAGNOSIS — M79672 Pain in left foot: Secondary | ICD-10-CM

## 2020-02-18 DIAGNOSIS — R600 Localized edema: Secondary | ICD-10-CM | POA: Insufficient documentation

## 2020-02-18 DIAGNOSIS — N183 Chronic kidney disease, stage 3 unspecified: Secondary | ICD-10-CM | POA: Insufficient documentation

## 2020-02-18 DIAGNOSIS — S93409A Sprain of unspecified ligament of unspecified ankle, initial encounter: Secondary | ICD-10-CM | POA: Insufficient documentation

## 2020-02-18 DIAGNOSIS — R609 Edema, unspecified: Secondary | ICD-10-CM

## 2020-02-18 DIAGNOSIS — I10 Essential (primary) hypertension: Secondary | ICD-10-CM | POA: Insufficient documentation

## 2020-02-18 DIAGNOSIS — R531 Weakness: Secondary | ICD-10-CM

## 2020-02-18 DIAGNOSIS — Z8739 Personal history of other diseases of the musculoskeletal system and connective tissue: Secondary | ICD-10-CM | POA: Diagnosis not present

## 2020-02-18 DIAGNOSIS — A419 Sepsis, unspecified organism: Secondary | ICD-10-CM

## 2020-02-18 DIAGNOSIS — R079 Chest pain, unspecified: Secondary | ICD-10-CM

## 2020-02-18 DIAGNOSIS — I951 Orthostatic hypotension: Secondary | ICD-10-CM | POA: Insufficient documentation

## 2020-02-18 DIAGNOSIS — E039 Hypothyroidism, unspecified: Secondary | ICD-10-CM | POA: Insufficient documentation

## 2020-02-18 HISTORY — DX: Chest pain, unspecified: R07.9

## 2020-02-18 HISTORY — DX: Unspecified asthma with (acute) exacerbation: J45.901

## 2020-02-18 HISTORY — DX: Sprain of unspecified ligament of unspecified ankle, initial encounter: S93.409A

## 2020-02-18 HISTORY — DX: Orthostatic hypotension: I95.1

## 2020-02-18 HISTORY — DX: Sepsis, unspecified organism: A41.9

## 2020-02-18 HISTORY — DX: Obstructive sleep apnea (adult) (pediatric): G47.33

## 2020-02-18 HISTORY — DX: Other acidosis: E87.29

## 2020-02-18 HISTORY — DX: Edema, unspecified: R60.9

## 2020-02-18 HISTORY — DX: Elevated white blood cell count, unspecified: D72.829

## 2020-02-18 HISTORY — DX: Essential (primary) hypertension: I10

## 2020-02-18 HISTORY — DX: Weakness: R53.1

## 2020-02-18 HISTORY — DX: Acute upper respiratory infection, unspecified: J06.9

## 2020-02-18 HISTORY — DX: Localized edema: R60.0

## 2020-02-18 HISTORY — DX: Acidosis: E87.2

## 2020-02-18 MED ORDER — TRIAMCINOLONE ACETONIDE 10 MG/ML IJ SUSP
10.0000 mg | Freq: Once | INTRAMUSCULAR | Status: AC
  Administered 2020-02-18: 10 mg

## 2020-02-18 NOTE — Progress Notes (Signed)
Subjective: Katie Rasmussen is a 49 y.o. female patient who presents to office for evaluation of left  foot pain. Patient complains of progressive pain especially over the last 3 weeks in the left foot at the midfoot with swelling and redness up the leg that's better after completing antibiotic. Reports she was told several times that her pain is due to gout. Ranks pain 6/10 and is now interferring with daily activities, pain is sharp in nature. Patient has tried brace with no relief in symptoms. Repot that it feels like a bone is coming thru her foot. Patient denies any other pedal complaints. Denies injury/trip/fall/sprain/any causative factors.   Patient is assisted by husband this visit.   Review of Systems  All other systems reviewed and are negative.    Patient Active Problem List   Diagnosis Date Noted  . Ankle sprain 02/18/2020  . Asthma exacerbation 02/18/2020  . Chest pain 02/18/2020  . CKD (chronic kidney disease), stage III 02/18/2020  . Hypertension 02/18/2020  . Hypothyroidism 02/18/2020  . Leukocytosis 02/18/2020  . Orthostatic hypotension 02/18/2020  . OSA (obstructive sleep apnea) 02/18/2020  . Peripheral edema 02/18/2020  . Respiratory acidosis 02/18/2020  . Sepsis ([place]) 02/18/2020  . Upper respiratory infection 02/18/2020  . Weakness 02/18/2020  . Postablative hypothyroidism 04/03/2019  . Dyspnea on exertion 05/07/2018  . Ischemic cardiomyopathy initial ejection fraction 35 to 40% in March 2019, normalization of ejection fraction based on echocardiogram from 2020 10/08/2017  . Coronary artery disease drug-eluting stent implanted to proximal LAD in March 2019 in face of acute myocardial infarction 10/08/2017  . Dyslipidemia 10/08/2017  . DCM (dilated cardiomyopathy) ([place])   . Elevated troponin   . Acute on chronic respiratory failure with hypoxia and hypercapnia ([place]) 09/14/2017  . Obesity hypoventilation syndrome ([place])   . Acute combined systolic and  diastolic CHF, NYHA class 3 ([place])   . COPD (chronic obstructive pulmonary disease) ([place])   . Pneumonia 08/10/2017  . Bronchitis due to tobacco use 08/10/2017    Current Outpatient Medications on File Prior to Visit  Medication Sig Dispense Refill  . ALPRAZolam (XANAX) 0.25 MG tablet Take 0.25 mg by mouth at bedtime as needed for anxiety.    Marland Kitchen albuterol (PROVENTIL) (2.5 MG/3ML) 0.083% nebulizer solution Take 3 mLs (2.5 mg total) by nebulization every 6 (six) hours as needed for wheezing or shortness of breath. 75 mL 12  . albuterol (VENTOLIN HFA) 108 (90 Base) MCG/ACT inhaler Inhale 2 puffs into the lungs every 4 (four) hours as needed for wheezing or shortness of breath. 14 g 11  . allopurinol (ZYLOPRIM) 100 MG tablet Take 50 mg by mouth daily.    Marland Kitchen aspirin EC 81 MG EC tablet Take 1 tablet (81 mg total) by mouth daily.    . clopidogrel (PLAVIX) 75 MG tablet TAKE 1 TABLET BY MOUTH ONCE DAILY WITH BREAKFAST. 90 tablet 1  . fluticasone furoate-vilanterol (BREO ELLIPTA) 200-25 MCG/INH AEPB Inhale 1 puff into the lungs daily. 1 each 11  . metolazone (ZAROXOLYN) 2.5 MG tablet Take 1 tablet (2.5 mg total) by mouth once a week. 30 min before morning dose of lasix. 30 tablet 0  . metoprolol succinate (TOPROL-XL) 25 MG 24 hr tablet TAKE 1/2 TABLET BY MOUTH ONCE DAILY, TAKE WITH OR IMMEDIATELY FOLLOWING A MEAL. 45 tablet 3  . sulfamethoxazole-trimethoprim (BACTRIM) 400-80 MG tablet Take 1 tablet by mouth 2 (two) times daily. 28 tablet 0  . SYNTHROID 175 MCG tablet Take 1 tablet by mouth daily.     Marland Kitchen  torsemide (DEMADEX) 20 MG tablet Take 1 tablet (20 mg total) by mouth 2 (two) times daily. 180 tablet 1   No current facility-administered medications on file prior to visit.    Allergies  Allergen Reactions  . Enoxaparin Sodium Other (See Comments)  . Iodine Hives and Itching  . Lovenox [Enoxaparin]   . No Known Allergies Hives  . Sulfa Antibiotics Other (See Comments)  . Shellfish Allergy Hives and  Itching    Objective:  General: Alert and oriented x3 in no acute distress  Dermatology: No open lesions bilateral lower extremities, no webspace macerations, no ecchymosis bilateral, all nails x 10 are well manicured.  Vascular: Dorsalis Pedis and Posterior Tibial pedal pulses palpable, faintly, Capillary Fill Time 3 seconds,(+) pedal hair growth bilateral, +1 pitting edema bilateral lower extremities worse on left, Temperature gradient within normal limits. Venous skin pigmentation on left.   Neurology: Johney Maine sensation intact via light touch bilateral.   Musculoskeletal: Mild to moderate tenderness with palpation at left foot at dorsal lateral aspect and along extensor tendons on left,No pain with calf compression bilateral. There is decreased ankle rom with knee extending  vs flexed resembling gastroc equnius bilateral, pain on left foot and with ankle range of motion.  Gait: Antalgic gait  Xrays  Left Foot   Impression: no acute fracture, mild midtarsal breach, very early bunion deformity. No other issues noted.   Assessment and Plan: Problem List Items Addressed This Visit    None    Visit Diagnoses    Rheumatoid arthritis of left foot, unspecified whether rheumatoid factor present ([place])    -  Primary   Relevant Orders   Uric acid   Sedimentation rate   C-reactive protein   Rheumatoid factor   ANA, IFA Comprehensive Panel   HLA-B27 antigen   CBC with Differential/Platelet   Tendonitis       Capsulitis       Left foot pain       History of gout          -Complete examination performed -Xrays reviewed -Discussed treatement options -Rx Arthritic panel -After oral consent and aseptic prep, injected a mixture containing 1 ml of 2%  plain lidocaine, 1 ml 0.5% plain marcaine, 0.5 ml of kenalog 10 and 0.5 ml of dexamethasone phosphate into left midfoot without complication. Post-injection care discussed with patient.  -Applied unna boot to keep intact for 5 days and then  use surgigrip compression sleeve -Recommend rest, ice, elevation daily until symptoms improve -Patient to return to office 10-14 days or sooner if condition worsens.  Landis Martins, DPM

## 2020-02-23 DIAGNOSIS — D509 Iron deficiency anemia, unspecified: Secondary | ICD-10-CM

## 2020-02-24 LAB — ANTINUCLEAR ANTIBODIES, IFA: ANA Titer 1: NEGATIVE

## 2020-02-24 LAB — CBC WITH DIFFERENTIAL/PLATELET
Basophils Absolute: 0 10*3/uL (ref 0.0–0.2)
Basos: 0 %
EOS (ABSOLUTE): 0 10*3/uL (ref 0.0–0.4)
Eos: 0 %
Hematocrit: 36.7 % (ref 34.0–46.6)
Hemoglobin: 12.4 g/dL (ref 11.1–15.9)
Immature Grans (Abs): 0.3 10*3/uL — ABNORMAL HIGH (ref 0.0–0.1)
Immature Granulocytes: 2 %
Lymphocytes Absolute: 1.2 10*3/uL (ref 0.7–3.1)
Lymphs: 7 %
MCH: 31.1 pg (ref 26.6–33.0)
MCHC: 33.8 g/dL (ref 31.5–35.7)
MCV: 92 fL (ref 79–97)
Monocytes Absolute: 0.9 10*3/uL (ref 0.1–0.9)
Monocytes: 5 %
Neutrophils Absolute: 13.9 10*3/uL — ABNORMAL HIGH (ref 1.4–7.0)
Neutrophils: 86 %
Platelets: 331 10*3/uL (ref 150–450)
RBC: 3.99 x10E6/uL (ref 3.77–5.28)
RDW: 13.7 % (ref 11.7–15.4)
WBC: 16.2 10*3/uL — ABNORMAL HIGH (ref 3.4–10.8)

## 2020-02-24 LAB — URIC ACID: Uric Acid: 7.6 mg/dL — ABNORMAL HIGH (ref 2.6–6.2)

## 2020-02-24 LAB — HLA-B27 ANTIGEN: HLA B27: NEGATIVE

## 2020-02-24 LAB — SEDIMENTATION RATE: Sed Rate: 27 mm/hr (ref 0–32)

## 2020-02-24 LAB — RHEUMATOID FACTOR: Rheumatoid fact SerPl-aCnc: <10 IU/mL (ref 0.0–13.9)

## 2020-02-24 LAB — C-REACTIVE PROTEIN: CRP: 40 mg/L — ABNORMAL HIGH (ref 0–10)

## 2020-02-27 ENCOUNTER — Telehealth: Payer: Self-pay

## 2020-02-27 NOTE — Telephone Encounter (Signed)
-----   Message from [place], [place] sent at 02/27/2020  7:58 AM EDT ----- Uric acid and inflammation marker is elevated likely related to gout. We can further discuss the results if she has any ?s when she sees me on next week

## 2020-02-27 NOTE — Telephone Encounter (Signed)
Tried contacting pt with lab results but the Mail box is full and cannot accept any messages at this time

## 2020-03-03 ENCOUNTER — Ambulatory Visit (INDEPENDENT_AMBULATORY_CARE_PROVIDER_SITE_OTHER): Payer: BC Managed Care – PPO | Admitting: Sports Medicine

## 2020-03-03 ENCOUNTER — Encounter: Payer: Self-pay | Admitting: Sports Medicine

## 2020-03-03 ENCOUNTER — Other Ambulatory Visit: Payer: Self-pay

## 2020-03-03 DIAGNOSIS — Z8739 Personal history of other diseases of the musculoskeletal system and connective tissue: Secondary | ICD-10-CM | POA: Diagnosis not present

## 2020-03-03 DIAGNOSIS — M779 Enthesopathy, unspecified: Secondary | ICD-10-CM

## 2020-03-03 DIAGNOSIS — M79672 Pain in left foot: Secondary | ICD-10-CM

## 2020-03-03 NOTE — Progress Notes (Signed)
Subjective: Katie Rasmussen is a 49 y.o. female patient who returns to office for follow up evaluation of left foot pain.  Patient reports that her pain is doing much better at most 1-2 out of 10 states that when she removes the Unna boot her foot and ankle feel a little weak and that there is very little swelling states that the most tenderness that she has now is at the medial aspect of the foot and reports that she was able to take all of her medications without any problems or issues.  Patient admits to a little soreness at the right hallux toenail and reports that since her foot has been bothering her has not been able to get pedicures to have it appropriately trimmed.  Patient denies redness warmth swelling drainage at right hallux nail.  Denies nausea vomiting fever chills or any other constitutional symptoms at this time.  Patient Active Problem List   Diagnosis Date Noted   Ankle sprain 02/18/2020   Asthma exacerbation 02/18/2020   Chest pain 02/18/2020   CKD (chronic kidney disease), stage III 02/18/2020   Hypertension 02/18/2020   Hypothyroidism 02/18/2020   Leukocytosis 02/18/2020   Orthostatic hypotension 02/18/2020   OSA (obstructive sleep apnea) 02/18/2020   Peripheral edema 02/18/2020   Respiratory acidosis 02/18/2020   Sepsis (HCC) 02/18/2020   Upper respiratory infection 02/18/2020   Weakness 02/18/2020   Postablative hypothyroidism 04/03/2019   Dyspnea on exertion 05/07/2018   Ischemic cardiomyopathy initial ejection fraction 35 to 40% in March 2019, normalization of ejection fraction based on echocardiogram from 2020 10/08/2017   Coronary artery disease drug-eluting stent implanted to proximal LAD in March 2019 in face of acute myocardial infarction 10/08/2017   Dyslipidemia 10/08/2017   DCM (dilated cardiomyopathy) (HCC)    Elevated troponin    Acute on chronic respiratory failure with hypoxia and hypercapnia (HCC) 09/14/2017   Obesity  hypoventilation syndrome (HCC)    Acute combined systolic and diastolic CHF, NYHA class 3 (HCC)    COPD (chronic obstructive pulmonary disease) (HCC)    Pneumonia 08/10/2017   Bronchitis due to tobacco use 08/10/2017   Current Outpatient Medications on File Prior to Visit  Medication Sig Dispense Refill   albuterol (PROVENTIL) (2.5 MG/3ML) 0.083% nebulizer solution Take 3 mLs (2.5 mg total) by nebulization every 6 (six) hours as needed for wheezing or shortness of breath. 75 mL 12   albuterol (VENTOLIN HFA) 108 (90 Base) MCG/ACT inhaler Inhale 2 puffs into the lungs every 4 (four) hours as needed for wheezing or shortness of breath. 14 g 11   allopurinol (ZYLOPRIM) 100 MG tablet Take 50 mg by mouth daily.     ALPRAZolam (XANAX) 0.25 MG tablet Take 0.25 mg by mouth at bedtime as needed for anxiety.     aspirin EC 81 MG EC tablet Take 1 tablet (81 mg total) by mouth daily.     clopidogrel (PLAVIX) 75 MG tablet TAKE 1 TABLET BY MOUTH ONCE DAILY WITH BREAKFAST. 90 tablet 1   fluticasone furoate-vilanterol (BREO ELLIPTA) 200-25 MCG/INH AEPB Inhale 1 puff into the lungs daily. 1 each 11   metolazone (ZAROXOLYN) 2.5 MG tablet Take 1 tablet (2.5 mg total) by mouth once a week. 30 min before morning dose of lasix. 30 tablet 0   metoprolol succinate (TOPROL-XL) 25 MG 24 hr tablet TAKE 1/2 TABLET BY MOUTH ONCE DAILY, TAKE WITH OR IMMEDIATELY FOLLOWING A MEAL. 45 tablet 3   sulfamethoxazole-trimethoprim (BACTRIM) 400-80 MG tablet Take 1 tablet by mouth  2 (two) times daily. 28 tablet 0   SYNTHROID 175 MCG tablet Take 1 tablet by mouth daily.      torsemide (DEMADEX) 20 MG tablet Take 1 tablet (20 mg total) by mouth 2 (two) times daily. 180 tablet 1   No current facility-administered medications on file prior to visit.   Allergies  Allergen Reactions   Enoxaparin Sodium Other (See Comments)   Iodine Hives and Itching   Lovenox [Enoxaparin]    No Known Allergies Hives   Sulfa  Antibiotics Other (See Comments)   Shellfish Allergy Hives and Itching    Objective:  General: Alert and oriented x3 in no acute distress  Dermatology: No open lesions bilateral lower extremities, no webspace macerations, no ecchymosis bilateral, all nails x 10 polished and thickened especially at the hallux with incurvation noted without any acute signs of infection at right hallux nail.  Neurovascular: Intact.  Minimal lower extremity edema on the left.  Musculoskeletal: No pain with palpation to left dorsal foot however there is still some residual pain to palpation at the posterior tibial tendon course especially at the navicular insertion on the left.  There is mild guarding due to pain at medial arch however strength is acceptable with subjective weakness of left ankle and no frank instability noted upon manipulation on the left ankle.  Assessment and Plan: Problem List Items Addressed This Visit    None    Visit Diagnoses    Tendonitis    -  Primary   Left foot pain       History of gout           -Complete examination performed -Discussed treatement options for tendinitis with gout -Rx Tri-Lock ankle support for patient to use to the left and also applied additional arch padding to her tennis shoe and advised patient to continue with both as long as comfortable -Advised patient if her symptoms fail to continue to improve to call office may benefit from additional anti-inflammatories versus injection in the medial foot on the left -At no additional charge mechanically debrided using a sterile nail nipper the right hallux nail -Advised patient to follow-up with PCP regarding gout for long-term management -Patient to return to office as needed or sooner if condition worsens.  Asencion Islam, DPM

## 2020-03-08 ENCOUNTER — Other Ambulatory Visit: Payer: Self-pay | Admitting: Sports Medicine

## 2020-03-08 DIAGNOSIS — M779 Enthesopathy, unspecified: Secondary | ICD-10-CM

## 2020-03-18 ENCOUNTER — Other Ambulatory Visit: Payer: Self-pay | Admitting: Cardiology

## 2020-03-22 ENCOUNTER — Telehealth: Payer: Self-pay

## 2020-03-22 NOTE — Telephone Encounter (Signed)
She will need to make an appointment, and now can get 15 tablets.  Labs can be done the same day

## 2020-03-23 ENCOUNTER — Other Ambulatory Visit: Payer: Self-pay | Admitting: Critical Care Medicine

## 2020-03-23 MED ORDER — SYNTHROID 175 MCG PO TABS: 175.0000 ug | ORAL_TABLET | Freq: Every day | ORAL | 0 refills | Status: DC

## 2020-04-02 ENCOUNTER — Encounter: Payer: Self-pay | Admitting: Cardiology

## 2020-04-02 ENCOUNTER — Other Ambulatory Visit: Payer: Self-pay

## 2020-04-02 ENCOUNTER — Ambulatory Visit (INDEPENDENT_AMBULATORY_CARE_PROVIDER_SITE_OTHER): Payer: BC Managed Care – PPO | Admitting: Cardiology

## 2020-04-02 VITALS — BP 160/86 | HR 88 | Ht 61.0 in | Wt 260.0 lb

## 2020-04-02 DIAGNOSIS — I420 Dilated cardiomyopathy: Secondary | ICD-10-CM

## 2020-04-02 DIAGNOSIS — I5041 Acute combined systolic (congestive) and diastolic (congestive) heart failure: Secondary | ICD-10-CM

## 2020-04-02 DIAGNOSIS — R0600 Dyspnea, unspecified: Secondary | ICD-10-CM | POA: Diagnosis not present

## 2020-04-02 DIAGNOSIS — R0609 Other forms of dyspnea: Secondary | ICD-10-CM

## 2020-04-02 MED ORDER — ALPRAZOLAM 0.25 MG PO TABS: 0.2500 mg | ORAL_TABLET | Freq: Every evening | ORAL | 0 refills | Status: DC | PRN

## 2020-04-02 NOTE — Patient Instructions (Signed)
Medication Instructions:  Your physician recommends that you continue on your current medications as directed. Please refer to the Current Medication list given to you today.  *If you need a refill on your cardiac medications before your next appointment, please call your pharmacy*   Lab Work: None. If you have labs (blood work) drawn today and your tests are completely normal, you will receive your results only by: . MyChart Message (if you have MyChart) OR . A paper copy in the mail If you have any lab test that is abnormal or we need to change your treatment, we will call you to review the results.   Testing/Procedures: Your physician has requested that you have an echocardiogram. Echocardiography is a painless test that uses sound waves to create images of your heart. It provides your doctor with information about the size and shape of your heart and how well your heart's chambers and valves are working. This procedure takes approximately one hour. There are no restrictions for this procedure.     Follow-Up: At [facility], you and your health needs are our priority.  As part of our continuing mission to provide you with exceptional heart care, we have created designated Provider Care Teams.  These Care Teams include your primary Cardiologist (physician) and Advanced Practice Providers (APPs -  Physician Assistants and Nurse Practitioners) who all work together to provide you with the care you need, when you need it.  We recommend signing up for the patient portal called "MyChart".  Sign up information is provided on this After Visit Summary.  MyChart is used to connect with patients for Virtual Visits (Telemedicine).  Patients are able to view lab/test results, encounter notes, upcoming appointments, etc.  Non-urgent messages can be sent to your provider as well.   To learn more about what you can do with MyChart, go to [email or web address].    Your next appointment:   3  month(s)  The format for your next appointment:   In Person  Provider:   Robert Krasowski, MD   Other Instructions   Echocardiogram An echocardiogram is a procedure that uses painless sound waves (ultrasound) to produce an image of the heart. Images from an echocardiogram can provide important information about:  Signs of coronary artery disease (CAD).  Aneurysm detection. An aneurysm is a weak or damaged part of an artery wall that bulges out from the normal force of blood pumping through the body.  Heart size and shape. Changes in the size or shape of the heart can be associated with certain conditions, including heart failure, aneurysm, and CAD.  Heart muscle function.  Heart valve function.  Signs of a past heart attack.  Fluid buildup around the heart.  Thickening of the heart muscle.  A tumor or infectious growth around the heart valves. Tell a health care provider about:  Any allergies you have.  All medicines you are taking, including vitamins, herbs, eye drops, creams, and over-the-counter medicines.  Any blood disorders you have.  Any surgeries you have had.  Any medical conditions you have.  Whether you are pregnant or may be pregnant. What are the risks? Generally, this is a safe procedure. However, problems may occur, including:  Allergic reaction to dye (contrast) that may be used during the procedure. What happens before the procedure? No specific preparation is needed. You may eat and drink normally. What happens during the procedure?   An IV tube may be inserted into one of your veins.  You may receive   contrast through this tube. A contrast is an injection that improves the quality of the pictures from your heart.  A gel will be applied to your chest.  A wand-like tool (transducer) will be moved over your chest. The gel will help to transmit the sound waves from the transducer.  The sound waves will harmlessly bounce off of your heart to  allow the heart images to be captured in real-time motion. The images will be recorded on a computer. The procedure may vary among health care providers and hospitals. What happens after the procedure?  You may return to your normal, everyday life, including diet, activities, and medicines, unless your health care provider tells you not to do that. Summary  An echocardiogram is a procedure that uses painless sound waves (ultrasound) to produce an image of the heart.  Images from an echocardiogram can provide important information about the size and shape of your heart, heart muscle function, heart valve function, and fluid buildup around your heart.  You do not need to do anything to prepare before this procedure. You may eat and drink normally.  After the echocardiogram is completed, you may return to your normal, everyday life, unless your health care provider tells you not to do that. This information is not intended to replace advice given to you by your health care provider. Make sure you discuss any questions you have with your health care provider. Document Revised: 10/17/2018 Document Reviewed: 07/29/2016 Elsevier Patient Education  2020 Elsevier Inc.   

## 2020-04-02 NOTE — Progress Notes (Signed)
Cardiology Office Note:    Date:  04/02/2020   ID:  Katie Rasmussen, DOB 20-May-1971, MRN [id number]  PCP:  Tamsen Snider, NP  Cardiologist:  Gypsy Balsam, MD    Referring MD: Tamsen Snider, NP   Chief Complaint  Patient presents with  . Follow-up  , Doing fair I was told to have pneumonia  History of Present Illness:    Katie Rasmussen is a 49 y.o. female with past medical history significant for diastolic congestive heart failure, COPD, chronic respiratory failure, hypothyroidism, obesity.  She comes today 2 months of follow-up.  Apparently recently she was diagnosed with pneumonia treated with Zithromax however does not feel much better.  Apparently chest x-ray showed pneumonia, she did not have any fever did not have any chills.  She also complained of having situation that her heart speeds up with no particular reason.  I would this is gradual onset and gradual offset.  Past Medical History:  Diagnosis Date  . Acute combined systolic and diastolic CHF, NYHA class 3 (HCC)   . Bronchitis due to tobacco use 08/2017  . Chronic respiratory failure (HCC)   . CKD (chronic kidney disease), stage III   . COPD (chronic obstructive pulmonary disease) (HCC)   . Coronary artery disease   . Diastolic CHF (HCC)   . Gout   . Hypothyroidism    h/o Grave's, s/p thyroidectomy & RAI in 1990s  . Obesity hypoventilation syndrome (HCC)   . Pneumonia 08/2017  . Systolic CHF with reduced left ventricular function, NYHA class 3 (HCC) 09/13/2017    Past Surgical History:  Procedure Laterality Date  . CESAREAN SECTION    . CORONARY STENT INTERVENTION N/A 09/18/2017   Procedure: CORONARY STENT INTERVENTION;  Surgeon: Tonny Bollman, MD;  [facility];  Service: Cardiovascular;  Laterality: N/A;  prox LAD  . LEFT HEART CATH AND CORONARY ANGIOGRAPHY N/A 09/18/2017   Procedure: LEFT HEART CATH AND CORONARY ANGIOGRAPHY;  Surgeon: Tonny Bollman, MD;   Location: [facility] INVASIVE CV LAB;  Service: Cardiovascular;  Laterality: N/A;  . THYROID SURGERY     s/p RAI for Grave's    Current Medications: Current Meds  Medication Sig  . albuterol (PROVENTIL) (2.5 MG/3ML) 0.083% nebulizer solution Take 3 mLs (2.5 mg total) by nebulization every 6 (six) hours as needed for wheezing or shortness of breath.  . allopurinol (ZYLOPRIM) 100 MG tablet Take 50 mg by mouth daily.  Marland Kitchen ALPRAZolam (XANAX) 0.25 MG tablet Take 1 tablet (0.25 mg total) by mouth at bedtime as needed for anxiety.  Marland Kitchen aspirin EC 81 MG EC tablet Take 1 tablet (81 mg total) by mouth daily.  . clopidogrel (PLAVIX) 75 MG tablet TAKE 1 TABLET BY MOUTH ONCE DAILY WITH BREAKFAST.  . fluticasone furoate-vilanterol (BREO ELLIPTA) 200-25 MCG/INH AEPB Inhale 1 puff into the lungs daily.  . metolazone (ZAROXOLYN) 2.5 MG tablet Take 1 tablet (2.5 mg total) by mouth once a week. 30 min before morning dose of lasix.  Marland Kitchen metoprolol succinate (TOPROL-XL) 25 MG 24 hr tablet TAKE 1/2 TABLET BY MOUTH ONCE DAILY, TAKE WITH OR IMMEDIATELY FOLLOWING A MEAL.  Marland Kitchen SYNTHROID 175 MCG tablet Take 1 tablet (175 mcg total) by mouth daily.  Marland Kitchen torsemide (DEMADEX) 20 MG tablet TAKE 1 TABLET BY MOUTH TWICE DAILY.  . VENTOLIN HFA 108 (90 Base) MCG/ACT inhaler INHALE 2 PUFFS BY MOUTH EVERY 4 HOURS ASNEEDED FOR SHORTNESS OF BREATH.  . [DISCONTINUED] ALPRAZolam (XANAX) 0.25 MG tablet Take  0.25 mg by mouth at bedtime as needed for anxiety.     Allergies:   Enoxaparin sodium, Iodine, Lovenox [enoxaparin], No known allergies, Sulfa antibiotics, and Shellfish allergy   Social History   Socioeconomic History  . Marital status: Married    Spouse name: Not on file  . Number of children: 3  . Years of education: Not on file  . Highest education level: Not on file  Occupational History  . 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 Smoking status: Former Smoker    Packs/day: 0.50    Years: 14.00    Pack  years: 7.00    Types: Cigarettes    Quit date: 09/12/2017    Years since quitting: 2.5  . Smokeless tobacco: Never Used  Vaping Use  . Vaping Use: Never used  Substance and Sexual Activity  . Alcohol use: No  . Drug use: No  . Sexual activity: Not on file  Other Topics Concern  . Not on file  Social History Narrative  . Not on file   Social Determinants of Health   Financial Resource Strain:   . Difficulty of Paying Living Expenses: Not on file  Food Insecurity:   . Worried About Programme researcher, broadcasting/film/video in the Last Year: Not on file  . Ran Out of Food in the Last Year: Not on file  Transportation Needs:   . Lack of Transportation (Medical): Not on file  . Lack of Transportation (Non-Medical): Not on file  Physical Activity:   . Days of Exercise per Week: Not on file  . Minutes of Exercise per Session: Not on file  Stress:   . Feeling of Stress : Not on file  Social Connections:   . Frequency of Communication with Friends and Family: Not on file  . Frequency of Social Gatherings with Friends and Family: Not on file  . Attends Religious Services: Not on file  . Active Member of Clubs or Organizations: Not on file  . Attends Banker Meetings: Not on file  . Marital Status: Not on file     Family History: The patient's family history includes Diabetes in her father; Heart disease in her father and mother; Hypertension in her mother. ROS:   Please see the history of present illness.    All 14 point review of systems negative except as described per history of present illness  EKGs/Labs/Other Studies Reviewed:      Recent Labs: 05/06/2019: TSH 1.950 10/10/2019: ALT 14; BUN 15; Creatinine, Ser 1.55; NT-Pro BNP 536; Potassium 4.2; Sodium 140 02/19/2020: Hemoglobin 12.4; Platelets 331  Recent Lipid Panel No results found for: CHOL, TRIG, HDL, CHOLHDL, VLDL, LDLCALC, LDLDIRECT  Physical Exam:    VS:  BP (!) 160/86 (BP Location: Left Arm, Patient Position: Sitting,  Cuff Size: Large)   Pulse 88   Ht 5\' 1"  (1.549 m)   Wt 260 lb (117.9 kg)   SpO2 97%   BMI 49.13 kg/m     Wt Readings from Last 3 Encounters:  04/02/20 260 lb (117.9 kg)  12/10/19 267 lb (121.1 kg)  10/10/19 274 lb (124.3 kg)     GEN:  Well nourished, well developed in no acute distress HEENT: Normal NECK: No JVD; No carotid bruits LYMPHATICS: No lymphadenopathy CARDIAC: RRR, no murmurs, no rubs, no gallops RESPIRATORY:  Clear to auscultation without rales, wheezing or rhonchi  ABDOMEN: Soft, non-tender, non-distended MUSCULOSKELETAL:  No edema; No deformity  SKIN: Warm and  dry LOWER EXTREMITIES: no swelling NEUROLOGIC:  Alert and oriented x 3 PSYCHIATRIC:  Normal affect   ASSESSMENT:    1. Acute combined systolic and diastolic CHF, NYHA class 3 (HCC)   2. DCM (dilated cardiomyopathy) (HCC)   3. Dyspnea on exertion    PLAN:    In order of problems listed above:  1. Diastolic congestive heart failure.  She is appears to be compensated today.  On appropriate medications which I will continue. 2. History of dilated cardiomyopathy we will do echocardiogram to recheck left ventricle ejection fraction to see if there is a need to adjust her medications. 3. Dyspnea on exertion: Probably related to pneumonia and is happening right now.  She is scheduled to see her primary care physician on Monday. She cries in my office, she is with her 1 child in my office.  She said she is very nervous she will ask me to give her a small dose of Xanax which I will.  She uses Xanax very sporadically.  0.25 mg only 10 tablets given  Medication Adjustments/Labs and Tests Ordered: Current medicines are reviewed at length with the patient today.  Concerns regarding medicines are outlined above.  Orders Placed This Encounter  Procedures  . ECHOCARDIOGRAM COMPLETE   Medication changes:  Meds ordered this encounter  Medications  . ALPRAZolam (XANAX) 0.25 MG tablet    Sig: Take 1 tablet (0.25 mg  total) by mouth at bedtime as needed for anxiety.    Dispense:  10 tablet    Refill:  0    Signed, Georgeanna Lea, MD, [facility] 04/02/2020 3:46 PM    [facility]

## 2020-04-05 ENCOUNTER — Other Ambulatory Visit: Payer: Self-pay | Admitting: Endocrinology

## 2020-04-27 ENCOUNTER — Ambulatory Visit (INDEPENDENT_AMBULATORY_CARE_PROVIDER_SITE_OTHER): Payer: BC Managed Care – PPO

## 2020-04-27 ENCOUNTER — Other Ambulatory Visit: Payer: Self-pay

## 2020-04-27 DIAGNOSIS — I5041 Acute combined systolic (congestive) and diastolic (congestive) heart failure: Secondary | ICD-10-CM | POA: Diagnosis not present

## 2020-04-27 LAB — ECHOCARDIOGRAM COMPLETE
Area-P 1/2: 5.84 cm2
S' Lateral: 2.5 cm

## 2020-04-27 NOTE — Progress Notes (Signed)
Complete echocardiogram performed.  Jimmy Halea Lieb RDCS, RVT  

## 2020-05-05 ENCOUNTER — Telehealth: Payer: Self-pay | Admitting: Sports Medicine

## 2020-05-05 ENCOUNTER — Other Ambulatory Visit: Payer: Self-pay | Admitting: Sports Medicine

## 2020-05-05 MED ORDER — PREDNISONE 10 MG (21) PO TBPK: ORAL_TABLET | ORAL | 0 refills | Status: DC

## 2020-05-05 MED ORDER — COLCHICINE 0.6 MG PO TABS: 0.6000 mg | ORAL_TABLET | Freq: Every day | ORAL | 0 refills | Status: DC

## 2020-05-05 NOTE — Telephone Encounter (Signed)
Pt is requesting rx for gout left foot [facility] [facility]

## 2020-05-05 NOTE — Telephone Encounter (Signed)
Thanks Meds were sent

## 2020-05-05 NOTE — Progress Notes (Signed)
Rx prednisone and colchicine for gout. Patient to get f/u as scheduled on next week

## 2020-05-14 ENCOUNTER — Encounter: Payer: Self-pay | Admitting: Sports Medicine

## 2020-05-14 ENCOUNTER — Other Ambulatory Visit: Payer: Self-pay

## 2020-05-14 ENCOUNTER — Ambulatory Visit (INDEPENDENT_AMBULATORY_CARE_PROVIDER_SITE_OTHER): Payer: BC Managed Care – PPO | Admitting: Sports Medicine

## 2020-05-14 DIAGNOSIS — M79672 Pain in left foot: Secondary | ICD-10-CM | POA: Diagnosis not present

## 2020-05-14 DIAGNOSIS — M069 Rheumatoid arthritis, unspecified: Secondary | ICD-10-CM

## 2020-05-14 DIAGNOSIS — R609 Edema, unspecified: Secondary | ICD-10-CM | POA: Diagnosis not present

## 2020-05-14 DIAGNOSIS — Z8739 Personal history of other diseases of the musculoskeletal system and connective tissue: Secondary | ICD-10-CM

## 2020-05-14 DIAGNOSIS — M779 Enthesopathy, unspecified: Secondary | ICD-10-CM

## 2020-05-14 NOTE — Progress Notes (Signed)
Subjective: Katie Rasmussen is a 49 y.o. female patient who returns to office for follow up evaluation of left foot pain.  Patient reports that her pain is doing better.  Reports that the gout medicine seemed to help but did not take the prednisone because of her new diagnosis of SVT and was worried that it would mess up her heart.  Patient reports that she is wondering why she keeps having flareups versus reports that she has pain and flareups at least monthly.  Patient states that she has not seen a rheumatologist at this point and will also discuss this with her PCP for the appropriate referral.  No other pedal complaints noted.  Patient Active Problem List   Diagnosis Date Noted  . Ankle sprain 02/18/2020  . Asthma exacerbation 02/18/2020  . Chest pain 02/18/2020  . CKD (chronic kidney disease), stage III (HCC) 02/18/2020  . Hypertension 02/18/2020  . Hypothyroidism 02/18/2020  . Leukocytosis 02/18/2020  . Orthostatic hypotension 02/18/2020  . OSA (obstructive sleep apnea) 02/18/2020  . Peripheral edema 02/18/2020  . Respiratory acidosis 02/18/2020  . Sepsis (HCC) 02/18/2020  . Upper respiratory infection 02/18/2020  . Weakness 02/18/2020  . Postablative hypothyroidism 04/03/2019  . Dyspnea on exertion 05/07/2018  . Ischemic cardiomyopathy initial ejection fraction 35 to 40% in March 2019, normalization of ejection fraction based on echocardiogram from 2020 10/08/2017  . Coronary artery disease drug-eluting stent implanted to proximal LAD in March 2019 in face of acute myocardial infarction 10/08/2017  . Dyslipidemia 10/08/2017  . DCM (dilated cardiomyopathy) (HCC)   . Elevated troponin   . Acute on chronic respiratory failure with hypoxia and hypercapnia (HCC) 09/14/2017  . Obesity hypoventilation syndrome (HCC)   . Acute combined systolic and diastolic CHF, NYHA class 3 (HCC)   . COPD (chronic obstructive pulmonary disease) (HCC)   . Pneumonia 08/10/2017  . Bronchitis  due to tobacco use 08/10/2017   Current Outpatient Medications on File Prior to Visit  Medication Sig Dispense Refill  . albuterol (PROVENTIL) (2.5 MG/3ML) 0.083% nebulizer solution Take 3 mLs (2.5 mg total) by nebulization every 6 (six) hours as needed for wheezing or shortness of breath. 75 mL 12  . allopurinol (ZYLOPRIM) 100 MG tablet Take 50 mg by mouth daily.    Marland Kitchen ALPRAZolam (XANAX) 0.25 MG tablet Take 1 tablet (0.25 mg total) by mouth at bedtime as needed for anxiety. 10 tablet 0  . aspirin EC 81 MG EC tablet Take 1 tablet (81 mg total) by mouth daily.    . clopidogrel (PLAVIX) 75 MG tablet TAKE 1 TABLET BY MOUTH ONCE DAILY WITH BREAKFAST. 90 tablet 1  . colchicine 0.6 MG tablet Take 1 tablet (0.6 mg total) by mouth daily. 7 tablet 0  . fluticasone furoate-vilanterol (BREO ELLIPTA) 200-25 MCG/INH AEPB Inhale 1 puff into the lungs daily. 1 each 11  . metolazone (ZAROXOLYN) 2.5 MG tablet Take 1 tablet (2.5 mg total) by mouth once a week. 30 min before morning dose of lasix. 30 tablet 0  . metoprolol succinate (TOPROL-XL) 25 MG 24 hr tablet TAKE 1/2 TABLET BY MOUTH ONCE DAILY, TAKE WITH OR IMMEDIATELY FOLLOWING A MEAL. 45 tablet 3  . predniSONE (STERAPRED UNI-PAK 21 TAB) 10 MG (21) TBPK tablet Take as directed 21 tablet 0  . SYNTHROID 175 MCG tablet TAKE 1 TABLET BY MOUTH DAILY. *NEEDS APPOINTMENT* 15 tablet 0  . torsemide (DEMADEX) 20 MG tablet TAKE 1 TABLET BY MOUTH TWICE DAILY. 180 tablet 1  . VENTOLIN HFA 108 (  90 Base) MCG/ACT inhaler INHALE 2 PUFFS BY MOUTH EVERY 4 HOURS ASNEEDED FOR SHORTNESS OF BREATH. 18 g 3   No current facility-administered medications on file prior to visit.   Allergies  Allergen Reactions  . Enoxaparin Sodium Other (See Comments)  . Iodine Hives and Itching  . Lovenox [Enoxaparin]   . No Known Allergies Hives  . Sulfa Antibiotics Other (See Comments)  . Shellfish Allergy Hives and Itching    Objective:  General: Alert and oriented x3 in no acute  distress  Dermatology: No open lesions bilateral lower extremities, no webspace macerations, no ecchymosis bilateral, all nails x 10 polished and thickened especially at the hallux with incurvation noted without any acute signs of infection at right hallux nail like previous.  Neurovascular: Intact.  1+ pitting edema to the left foot.  Musculoskeletal: Decreased pain to palpation to left foot especially over the posterior tibial tendon course.  There is mild ankle pain with dorsiflexion otherwise is doing much better than last week per patient.  Strength 4 out of 5 left over right.  Assessment and Plan: Problem List Items Addressed This Visit    None    Visit Diagnoses    Tendonitis    -  Primary   Swelling       Left foot pain       History of gout       Capsulitis       Rheumatoid arthritis of left foot, unspecified whether rheumatoid factor present (HCC)           -Complete examination performed -Discussed treatement options for tendinitis with gout with history of recurrent flares -Advised patient to discuss with PCP referral to rheumatology if the are not successfully managing her gout -Patient may continue with Tri-Lock for support to left ankle however when she is swollen advised her to use Surgigrip compression sleeve as dispensed this visit and cam boot as dispensed this visit for any increased episodes of pain -Educated patient on avoiding foods that could cause gouty flareup and worsening of her foot pain -Patient to return to office as needed or sooner if condition worsens.  Asencion Islam, DPM

## 2020-05-18 ENCOUNTER — Other Ambulatory Visit: Payer: Self-pay | Admitting: Cardiology

## 2020-06-22 ENCOUNTER — Other Ambulatory Visit: Payer: Self-pay | Admitting: Critical Care Medicine

## 2020-06-30 ENCOUNTER — Other Ambulatory Visit: Payer: Self-pay

## 2020-06-30 DIAGNOSIS — M109 Gout, unspecified: Secondary | ICD-10-CM | POA: Insufficient documentation

## 2020-06-30 DIAGNOSIS — J9610 Chronic respiratory failure, unspecified whether with hypoxia or hypercapnia: Secondary | ICD-10-CM | POA: Insufficient documentation

## 2020-06-30 DIAGNOSIS — I5030 Unspecified diastolic (congestive) heart failure: Secondary | ICD-10-CM | POA: Insufficient documentation

## 2020-07-08 ENCOUNTER — Other Ambulatory Visit: Payer: Self-pay

## 2020-07-08 ENCOUNTER — Ambulatory Visit (INDEPENDENT_AMBULATORY_CARE_PROVIDER_SITE_OTHER): Payer: BC Managed Care – PPO | Admitting: Cardiology

## 2020-07-08 ENCOUNTER — Encounter: Payer: Self-pay | Admitting: Cardiology

## 2020-07-08 VITALS — BP 158/82 | HR 91 | Ht 61.0 in | Wt 252.0 lb

## 2020-07-08 DIAGNOSIS — I471 Supraventricular tachycardia, unspecified: Secondary | ICD-10-CM

## 2020-07-08 DIAGNOSIS — I5032 Chronic diastolic (congestive) heart failure: Secondary | ICD-10-CM | POA: Diagnosis not present

## 2020-07-08 DIAGNOSIS — I2510 Atherosclerotic heart disease of native coronary artery without angina pectoris: Secondary | ICD-10-CM

## 2020-07-08 DIAGNOSIS — J42 Unspecified chronic bronchitis: Secondary | ICD-10-CM

## 2020-07-08 DIAGNOSIS — I255 Ischemic cardiomyopathy: Secondary | ICD-10-CM

## 2020-07-08 DIAGNOSIS — I5020 Unspecified systolic (congestive) heart failure: Secondary | ICD-10-CM

## 2020-07-08 HISTORY — DX: Supraventricular tachycardia: I47.1

## 2020-07-08 HISTORY — DX: Supraventricular tachycardia, unspecified: I47.10

## 2020-07-08 NOTE — Patient Instructions (Signed)

## 2020-07-08 NOTE — Progress Notes (Signed)
Cardiology Office Note:    Date:  07/08/2020   ID:  Brissa Solis, DOB 11/05/1970, MRN [id number]  PCP:  Tamsen Snider, NP  Cardiologist:  Gypsy Balsam, MD    Referring MD: Tamsen Snider, NP   Chief Complaint  Patient presents with  . Tachycardia    History of Present Illness:    Aneeka Lusia Rasmussen is a 49 y.o. female complex past medical history which include cardiomyopathy with normalization, last echocardiogram checked in October 2021 showing normal left ventricle ejection fraction, diastolic dysfunctions, COPD with chronic lung issues, chronic respiratory failure, hypothyroidism, obesity, coronary artery disease with PTCA and stenting of proximal LAD in March 2019.  She comes today to my office is since her primary care physician put monitor on her.  She was found to have supraventricular tachycardia.  What triggered this investigation was palpitations.  Interestingly since that time patient by herself reduce dose of Synthroid she takes Synthroid only 5-6 times a week rather than every day.  Since that time she noticed significant decrease in frequency of palpitations actually she denies having any she feels well overall.  Still complaining of having some shortness of breath which is ongoing problem.  She does have a child with multiple medical problems and she actually see some nurse who comes to see her children that nurse make some very bad comments about her being lazy and fat.  That really depressed.  However at the same time she started working on her weight.  She lost 11 pounds and she feels significantly better.  Past Medical History:  Diagnosis Date  . Acute combined systolic and diastolic CHF, NYHA class 3 (HCC)   . Acute on chronic respiratory failure with hypoxia and hypercapnia (HCC) 09/14/2017  . Ankle sprain 02/18/2020  . Asthma exacerbation 02/18/2020  . Bronchitis due to tobacco use 08/2017  . Chest pain 02/18/2020  . Chronic respiratory  failure (HCC)   . CKD (chronic kidney disease), stage III (HCC)   . COPD (chronic obstructive pulmonary disease) (HCC)   . Coronary artery disease   . DCM (dilated cardiomyopathy) (HCC)   . Diastolic CHF (HCC)   . Dyslipidemia 10/08/2017  . Dyspnea on exertion 05/07/2018  . Elevated troponin   . Gout   . Hypertension 02/18/2020  . Hypothyroidism    h/o Grave's, s/p thyroidectomy & RAI in 1990s  . Ischemic cardiomyopathy initial ejection fraction 35 to 40% in March 2019, normalization of ejection fraction based on echocardiogram from 2020 10/08/2017   Ejection fraction 35-40% in March 2019  . Leukocytosis 02/18/2020  . Obesity hypoventilation syndrome (HCC)   . Orthostatic hypotension 02/18/2020  . OSA (obstructive sleep apnea) 02/18/2020  . Peripheral edema 02/18/2020  . Pneumonia 08/2017  . Postablative hypothyroidism 04/03/2019  . Respiratory acidosis 02/18/2020  . Sepsis (HCC) 02/18/2020  . Systolic CHF with reduced left ventricular function, NYHA class 3 (HCC) 09/13/2017  . Upper respiratory infection 02/18/2020  . Weakness 02/18/2020    Past Surgical History:  Procedure Laterality Date  . CESAREAN SECTION    . CORONARY STENT INTERVENTION N/A 09/18/2017   Procedure: CORONARY STENT INTERVENTION;  Surgeon: Tonny Bollman, MD;  Location: [facility] INVASIVE CV LAB;  Service: Cardiovascular;  Laterality: N/A;  prox LAD  . LEFT HEART CATH AND CORONARY ANGIOGRAPHY N/A 09/18/2017   Procedure: LEFT HEART CATH AND CORONARY ANGIOGRAPHY;  Surgeon: Tonny Bollman, MD;  Location: [facility] INVASIVE CV LAB;  Service: Cardiovascular;  Laterality: N/A;  . THYROID SURGERY  s/p RAI for Grave's    Current Medications: Current Meds  Medication Sig  . albuterol (PROVENTIL) (2.5 MG/3ML) 0.083% nebulizer solution Take 3 mLs (2.5 mg total) by nebulization every 6 (six) hours as needed for wheezing or shortness of breath.  . allopurinol (ZYLOPRIM) 100 MG tablet Take 50 mg by mouth daily.  Marland Kitchen ALPRAZolam (XANAX) 0.25 MG  tablet Take 1 tablet (0.25 mg total) by mouth at bedtime as needed for anxiety.  Marland Kitchen aspirin EC 81 MG EC tablet Take 1 tablet (81 mg total) by mouth daily.  . clopidogrel (PLAVIX) 75 MG tablet TAKE 1 TABLET BY MOUTH ONCE DAILY WITH BREAKFAST.  . cyanocobalamin (,VITAMIN B-12,) 1000 MCG/ML injection Inject 1,000 mcg into the muscle every 30 (thirty) days.  . fluticasone furoate-vilanterol (BREO ELLIPTA) 200-25 MCG/INH AEPB Inhale 1 puff into the lungs daily.  . metolazone (ZAROXOLYN) 2.5 MG tablet Take 1 tablet (2.5 mg total) by mouth once a week. 30 min before morning dose of lasix.  Marland Kitchen metoprolol succinate (TOPROL-XL) 25 MG 24 hr tablet TAKE 1/2 TABLET BY MOUTH ONCE DAILY, TAKE WITH OR IMMEDIATELY FOLLOWING A MEAL.  Marland Kitchen SYNTHROID 175 MCG tablet TAKE 1 TABLET BY MOUTH DAILY. *NEEDS APPOINTMENT*  . torsemide (DEMADEX) 20 MG tablet TAKE 1 TABLET BY MOUTH TWICE DAILY.  . VENTOLIN HFA 108 (90 Base) MCG/ACT inhaler INHALE 2 PUFFS BY MOUTH EVERY 4 HOURS ASNEEDED FOR SHORTNESS OF BREATH.     Allergies:   Enoxaparin sodium, Iodine, Lovenox [enoxaparin], No known allergies, Sulfa antibiotics, and Shellfish allergy   Social History   Socioeconomic History  . Marital status: Married    Spouse name: Not on file  . Number of children: 3  . Years of education: Not on file  . Highest education level: Not on file  Occupational History  . 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 Smoking status: Former Smoker    Packs/day: 0.50    Years: 14.00    Pack years: 7.00    Types: Cigarettes    Quit date: 09/12/2017    Years since quitting: 2.8  . Smokeless tobacco: Never Used  Vaping Use  . Vaping Use: Never used  Substance and Sexual Activity  . Alcohol use: No  . Drug use: No  . Sexual activity: Not on file  Other Topics Concern  . Not on file  Social History Narrative  . Not on file   Social Determinants of Health   Financial Resource Strain: Not on file  Food Insecurity:  Not on file  Transportation Needs: Not on file  Physical Activity: Not on file  Stress: Not on file  Social Connections: Not on file     Family History: The patient's family history includes Diabetes in her father; Heart disease in her father and mother; Hypertension in her mother. ROS:   Please see the history of present illness.    All 14 point review of systems negative except as described per history of present illness  EKGs/Labs/Other Studies Reviewed:      Recent Labs: 10/10/2019: ALT 14; BUN 15; Creatinine, Ser 1.55; NT-Pro BNP 536; Potassium 4.2; Sodium 140 02/19/2020: Hemoglobin 12.4; Platelets 331  Recent Lipid Panel No results found for: CHOL, TRIG, HDL, CHOLHDL, VLDL, LDLCALC, LDLDIRECT  Physical Exam:    VS:  BP (!) 158/82 (BP Location: Left Arm, Patient Position: Sitting)   Pulse 91   Ht 5\' 1"  (1.549 m)   Wt 252 lb (114.3 kg)  SpO2 97%   BMI 47.61 kg/m     Wt Readings from Last 3 Encounters:  07/08/20 252 lb (114.3 kg)  04/02/20 260 lb (117.9 kg)  12/10/19 267 lb (121.1 kg)     GEN:  Well nourished, well developed in no acute distress HEENT: Normal NECK: No JVD; No carotid bruits LYMPHATICS: No lymphadenopathy CARDIAC: RRR, no murmurs, no rubs, no gallops RESPIRATORY:  Clear to auscultation without rales, wheezing or rhonchi  ABDOMEN: Soft, non-tender, non-distended MUSCULOSKELETAL:  No edema; No deformity  SKIN: Warm and dry LOWER EXTREMITIES: no swelling NEUROLOGIC:  Alert and oriented x 3 PSYCHIATRIC:  Normal affect   ASSESSMENT:    1. Systolic CHF with reduced left ventricular function, NYHA class 3 (HCC)   2. Ischemic cardiomyopathy initial ejection fraction 35 to 40% in March 2019, normalization of ejection fraction based on echocardiogram from 2020   3. Chronic diastolic congestive heart failure (HCC)   4. Coronary artery disease involving native coronary artery of native heart without angina pectoris   5. Chronic bronchitis, unspecified  chronic bronchitis type (HCC)   6. Supraventricular tachycardia (HCC)    PLAN:    In order of problems listed above:  1. Systolic congestive heart failure however left ventricle ejection fraction last assessment normal.  She does have diastolic dysfunction however overall at least clinically she seems to be compensated. 2. Coronary disease with PTCA and stenting 2019 stable from that point review denies having any issues. 3. Shortness of breath which is chronic and ongoing problem.  Overall she lost some weight and she is feeling better.  We will continue present management. 4. Palpitations she apparently had monitor placed when she was told to have SVT.  She takes very small dose of beta-blocker secondary to her chronic lung condition and bronchospasm however herself she reduce the dose of Synthroid since that time she denies having any palpitations.  I will check her thyroid today.  I will not increase dose of metoprolol, in the future if she required some augmentation of therapy for her arrhythmia we may try to go up with beta-blocker may initiate some calcium channel blocker. 5. Chronic bronchitis/COPD that being followed by internal medicine team and pulmonary.   Medication Adjustments/Labs and Tests Ordered: Current medicines are reviewed at length with the patient today.  Concerns regarding medicines are outlined above.  Orders Placed This Encounter  Procedures  . Thyroid Profile  . TSH  . Basic metabolic panel   Medication changes: No orders of the defined types were placed in this encounter.   Signed, Georgeanna Lea, MD, [facility] 07/08/2020 2:36 PM    [facility]

## 2020-07-09 LAB — THYROID PANEL
Free Thyroxine Index: 1.4 (ref 1.2–4.9)
T3 Uptake Ratio: 19 % — ABNORMAL LOW (ref 24–39)
T4, Total: 7.5 ug/dL (ref 4.5–12.0)

## 2020-07-09 LAB — BASIC METABOLIC PANEL
BUN/Creatinine Ratio: 10 (ref 9–23)
BUN: 12 mg/dL (ref 6–24)
CO2: 22 mmol/L (ref 20–29)
Calcium: 9.9 mg/dL (ref 8.7–10.2)
Chloride: 99 mmol/L (ref 96–106)
Creatinine, Ser: 1.26 mg/dL — ABNORMAL HIGH (ref 0.57–1.00)
GFR calc Af Amer: 58 mL/min/{1.73_m2} — ABNORMAL LOW (ref >59)
GFR calc non Af Amer: 50 mL/min/{1.73_m2} — ABNORMAL LOW (ref >59)
Glucose: 96 mg/dL (ref 65–99)
Potassium: 4.2 mmol/L (ref 3.5–5.2)
Sodium: 139 mmol/L (ref 134–144)

## 2020-07-09 LAB — TSH: TSH: 3.47 u[IU]/mL (ref 0.450–4.500)

## 2020-07-12 ENCOUNTER — Other Ambulatory Visit: Payer: Self-pay | Admitting: Cardiology

## 2020-07-13 ENCOUNTER — Telehealth: Payer: Self-pay | Admitting: Emergency Medicine

## 2020-07-13 ENCOUNTER — Other Ambulatory Visit: Payer: Self-pay | Admitting: Critical Care Medicine

## 2020-07-13 NOTE — Telephone Encounter (Signed)
Called patient. She said her last episode was Sunday and it lasted about 30 minutes her heart rate was up to 168. She hasn't had a episode since. She thinks it could be panic attacks as well. Xanax seems to help she wants to know if Dr. Bing Matter can refill that for her. Furthermore if he wants to adjust medications due to her monitor and svt she would like instruction on that as well. No further questions.

## 2020-07-14 ENCOUNTER — Other Ambulatory Visit: Payer: Self-pay | Admitting: Hematology and Oncology

## 2020-07-14 DIAGNOSIS — E538 Deficiency of other specified B group vitamins: Secondary | ICD-10-CM

## 2020-07-14 MED ORDER — ALPRAZOLAM 0.25 MG PO TABS: 0.2500 mg | ORAL_TABLET | Freq: Every evening | ORAL | 0 refills | Status: DC | PRN

## 2020-07-14 NOTE — Addendum Note (Signed)
Addended by: Hazle Quant on: 07/14/2020 10:57 AM   Modules accepted: Orders

## 2020-07-14 NOTE — Telephone Encounter (Signed)
Refill Xanax 0.25 mg 10 tablets as needed every 8 hours

## 2020-07-15 MED ORDER — ALPRAZOLAM 0.25 MG PO TABS: 0.2500 mg | ORAL_TABLET | Freq: Every evening | ORAL | 0 refills | Status: DC | PRN

## 2020-07-15 NOTE — Addendum Note (Signed)
Addended by: Hazle Quant on: 07/15/2020 11:10 AM   Modules accepted: Orders

## 2020-07-15 NOTE — Telephone Encounter (Signed)
Called spoke to patient. Informed her that I will have Dr. Bing Matter sign the RX and I will fax to [facility] she verbally understood no further questions.

## 2020-07-15 NOTE — Telephone Encounter (Signed)
Signed rx faxed to [facility]

## 2020-07-16 ENCOUNTER — Other Ambulatory Visit: Payer: Self-pay

## 2020-07-19 NOTE — Telephone Encounter (Signed)
sent 

## 2020-07-26 ENCOUNTER — Ambulatory Visit: Payer: BC Managed Care – PPO | Admitting: Adult Health

## 2020-07-29 ENCOUNTER — Ambulatory Visit: Payer: BC Managed Care – PPO | Admitting: Adult Health

## 2020-08-02 ENCOUNTER — Other Ambulatory Visit: Payer: Self-pay | Admitting: Critical Care Medicine

## 2020-08-11 ENCOUNTER — Other Ambulatory Visit: Payer: Self-pay | Admitting: Cardiology

## 2020-08-25 ENCOUNTER — Inpatient Hospital Stay: Payer: BC Managed Care – PPO | Attending: Oncology

## 2020-08-25 ENCOUNTER — Inpatient Hospital Stay: Payer: BC Managed Care – PPO | Admitting: Oncology

## 2020-08-25 ENCOUNTER — Other Ambulatory Visit: Payer: Self-pay | Admitting: Oncology

## 2020-08-25 DIAGNOSIS — D508 Other iron deficiency anemias: Secondary | ICD-10-CM

## 2020-08-25 NOTE — Progress Notes (Incomplete)
  [facility]  [address] [place],  [place]  [address] [phone]  Clinic Day:  08/25/2020  Referring physician: Tamsen Snider, NP   HISTORY OF PRESENT ILLNESS:  The patient is a 50 y.o. female with anemia secondary to both iron and vitamin B12 deficiency.  IV Feraheme replenishing her iron stores.  She continues to take vitamin B12 injections on a monthly basis to ensure adequate fortification of her cobalamin stores.  Overall, the patient is has been doing well.  She denies having increased fatigue or any overt forms of blood loss which concern her for worsening anemia.  PHYSICAL EXAM:  There were no vitals taken for this visit. Wt Readings from Last 3 Encounters:  07/08/20 252 lb (114.3 kg)  04/02/20 260 lb (117.9 kg)  12/10/19 267 lb (121.1 kg)   There is no height or weight on file to calculate BMI. Performance status (ECOG): {CHL ONC [id number] Physical Exam  LABS:   CBC Latest Ref Rng & Units 02/19/2020 10/10/2019 03/11/2019  WBC 3.4 - 10.8 x10E3/uL 16.2(H) 11.4(H) 14.2(H)  Hemoglobin 11.1 - 15.9 g/dL 13.0 86.5 11.0(L)  Hematocrit 34.0 - 46.6 % 36.7 36.2 33.6(L)  Platelets 150 - 450 x10E3/uL 331 351 346.0   CMP Latest Ref Rng & Units 07/08/2020 10/10/2019 04/25/2019  Glucose 65 - 99 mg/dL 96 784(O) 962(X)  BUN 6 - 24 mg/dL 12 15 13   Creatinine 0.57 - 1.00 mg/dL ) 5.28(U) 1.32(G)  Sodium 134 - 144 mmol/L 139 140 140  Potassium 3.5 - 5.2 mmol/L 4.2 4.2 3.8  Chloride 96 - 106 mmol/L 99 97 100  CO2 20 - 29 mmol/L 22 24 24   Calcium 8.7 - 10.2 mg/dL 9.9 9.8 8.9  Total Protein 6.0 - 8.5 g/dL - 6.7 -  Total Bilirubin 0.0 - 1.2 mg/dL - 0.2 -  Alkaline Phos 39 - 117 IU/L - 113 -  AST 0 - 40 IU/L - 24 -  ALT 0 - 32 IU/L - 14 -     ASSESSMENT & PLAN:   Assessment/Plan:  A 50 y.o. female with anemia secondary to iron and vitamin B12 deficiency.  I am pleased as her hemoglobin remains at an ideal level.  Furthermore, her iron  and vitamin B12 parameters remain normal.  She knows to continue taking her B12 injections on a monthly basis.  Overall, the patient continues to do well.  I will see her back in 6 months for repeat clinical assessment.  If all of her hematologic parameters remain normal at that time, I will turn her care back over to her other physicians after her next visit.   .The patient understands all the plans discussed today and is in agreement with them.      Brentley Horrell , MD

## 2020-10-01 ENCOUNTER — Encounter: Payer: Self-pay | Admitting: Sports Medicine

## 2020-10-01 ENCOUNTER — Other Ambulatory Visit: Payer: Self-pay

## 2020-10-01 ENCOUNTER — Ambulatory Visit (INDEPENDENT_AMBULATORY_CARE_PROVIDER_SITE_OTHER): Payer: BC Managed Care – PPO | Admitting: Sports Medicine

## 2020-10-01 DIAGNOSIS — R609 Edema, unspecified: Secondary | ICD-10-CM | POA: Diagnosis not present

## 2020-10-01 DIAGNOSIS — Z8739 Personal history of other diseases of the musculoskeletal system and connective tissue: Secondary | ICD-10-CM | POA: Diagnosis not present

## 2020-10-01 DIAGNOSIS — M79671 Pain in right foot: Secondary | ICD-10-CM

## 2020-10-01 DIAGNOSIS — M779 Enthesopathy, unspecified: Secondary | ICD-10-CM | POA: Diagnosis not present

## 2020-10-01 MED ORDER — TRIAMCINOLONE ACETONIDE 10 MG/ML IJ SUSP
10.0000 mg | Freq: Once | INTRAMUSCULAR | Status: AC
  Administered 2020-10-01: 10 mg

## 2020-10-01 NOTE — Progress Notes (Signed)
Subjective: Katie Rasmussen is a 50 y.o. female patient who presents to office for evaluation of right foot pain reports that the pain and swelling has gotten significantly worse over the last 3 days and reports that it is very tender barely walk on it or let a sheet touch the foot due to the pain.  Patient reports that she had to pull out her cane and thinks that she is dealing with another gout flareup and reports that this is the second gout flareup for the year of which she thinks was caused by her eating a bunch of hamburgers.  Patient reports that he was diagnosed with Covid and had a hard time recovering.  Patient Active Problem List   Diagnosis Date Noted  . Supraventricular tachycardia (HCC) 07/08/2020  . Gout   . Diastolic CHF (HCC)   . Chronic respiratory failure (HCC)   . Ankle sprain 02/18/2020  . Asthma exacerbation 02/18/2020  . Chest pain 02/18/2020  . CKD (chronic kidney disease), stage III (HCC) 02/18/2020  . Hypertension 02/18/2020  . Hypothyroidism 02/18/2020  . Leukocytosis 02/18/2020  . OSA (obstructive sleep apnea) 02/18/2020  . Peripheral edema 02/18/2020  . Respiratory acidosis 02/18/2020  . Sepsis (HCC) 02/18/2020  . Upper respiratory infection 02/18/2020  . Weakness 02/18/2020  . Postablative hypothyroidism 04/03/2019  . Dyspnea on exertion 05/07/2018  . Ischemic cardiomyopathy initial ejection fraction 35 to 40% in March 2019, normalization of ejection fraction based on echocardiogram from 2020 10/08/2017  . Coronary artery disease drug-eluting stent implanted to proximal LAD in March 2019 in face of acute myocardial infarction 10/08/2017  . Dyslipidemia 10/08/2017  . DCM (dilated cardiomyopathy) (HCC)   . Elevated troponin   . Acute on chronic respiratory failure with hypoxia and hypercapnia (HCC) 09/14/2017  . Obesity hypoventilation syndrome (HCC)   . Acute combined systolic and diastolic CHF, NYHA class 3 (HCC)   . COPD (chronic obstructive  pulmonary disease) (HCC)   . Systolic CHF with reduced left ventricular function, NYHA class 3 (HCC) 09/13/2017  . Pneumonia 08/10/2017  . Bronchitis due to tobacco use 08/10/2017    Current Outpatient Medications on File Prior to Visit  Medication Sig Dispense Refill  . albuterol (PROVENTIL) (2.5 MG/3ML) 0.083% nebulizer solution Take 3 mLs (2.5 mg total) by nebulization every 6 (six) hours as needed for wheezing or shortness of breath. 75 mL 12  . allopurinol (ZYLOPRIM) 100 MG tablet Take 50 mg by mouth daily.    Marland Kitchen ALPRAZolam (XANAX) 0.25 MG tablet Take 1 tablet (0.25 mg total) by mouth at bedtime as needed for anxiety. 10 tablet 0  . aspirin EC 81 MG EC tablet Take 1 tablet (81 mg total) by mouth daily.    Marland Kitchen atorvastatin (LIPITOR) 20 MG tablet Take 20 mg by mouth daily.    . clopidogrel (PLAVIX) 75 MG tablet TAKE 1 TABLET BY MOUTH ONCE DAILY WITH BREAKFAST. 90 tablet 2  . cyanocobalamin (,VITAMIN B-12,) 1000 MCG/ML injection INJECT SUBQ ONCE MONTHLY. 3 mL 4  . fluticasone furoate-vilanterol (BREO ELLIPTA) 200-25 MCG/INH AEPB Inhale 1 puff into the lungs daily. 1 each 11  . gabapentin (NEURONTIN) 100 MG capsule Take 100 mg by mouth 3 (three) times daily.    Marland Kitchen levofloxacin (LEVAQUIN) 500 MG tablet Take 500 mg by mouth daily.    . metolazone (ZAROXOLYN) 2.5 MG tablet Take 1 tablet (2.5 mg total) by mouth once a week. 30 min before morning dose of lasix. 30 tablet 0  . metoprolol succinate (  TOPROL-XL) 25 MG 24 hr tablet TAKE 1/2 TABLET BY MOUTH ONCE DAILY, TAKE WITH OR IMMEDIATELY FOLLOWING A MEAL. 45 tablet 3  . nystatin ointment (MYCOSTATIN) Apply topically.    . ondansetron (ZOFRAN-ODT) 4 MG disintegrating tablet Take 2 mg by mouth every 6 (six) hours as needed.    . promethazine (PHENERGAN) 25 MG tablet Take by mouth.    . SYNTHROID 175 MCG tablet TAKE 1 TABLET BY MOUTH DAILY. *NEEDS APPOINTMENT* 15 tablet 0  . torsemide (DEMADEX) 20 MG tablet TAKE 1 TABLET BY MOUTH TWICE DAILY. 180  tablet 1  . VENTOLIN HFA 108 (90 Base) MCG/ACT inhaler INHALE 2 PUFFS BY MOUTH EVERY 4 HOURS ASNEEDED FOR SHORTNESS OF BREATH. 18 g 0  . Vitamin D, Ergocalciferol, (DRISDOL) 1.25 MG (50000 UNIT) CAPS capsule Take 50,000 Units by mouth once a week.     No current facility-administered medications on file prior to visit.    Allergies  Allergen Reactions  . Enoxaparin Sodium Other (See Comments)  . Iodine Hives and Itching  . Lovenox [Enoxaparin]   . No Known Allergies Hives  . Sulfa Antibiotics Other (See Comments)  . Shellfish Allergy Hives and Itching    Objective:  General: Alert and oriented x3 in no acute distress  Dermatology: Focal Swelling, warmth, redness present on the Right midfoot likely consistent with her history of gout, no open lesions bilateral lower extremities, no webspace macerations, no ecchymosis bilateral, all nails x 10 are well manicured.  Vascular: Dorsalis Pedis and Posterior Tibial pedal pulses 1/4, Capillary Fill Time 3 seconds,(+) pedal hair growth bilateral,Temperature gradient increased over the right midfoot.  Neurology: Gross sensation intact via light touch bilateral.  Patient is hypersensitive to everything even light touch.  Musculoskeletal: There is tenderness with palpation at dorsal midfoot on the right,No pain with calf compression bilateral. Strength within normal limits in all groups bilateral.         Assessment and Plan: Problem List Items Addressed This Visit   None   Visit Diagnoses    Capsulitis    -  Primary   Tendonitis       Swelling       Right foot pain       History of gout           -Complete examination performed -Patient declined x-rays at this time -Discussed treatement options for gouty arthritis and gout education provided - After oral consent, injected right dorsal midfoot with 1cc lidocaine and marcaine plain mixed with 0.25cc Kenalog-10 and Dexmethasone phosphate without complication; post injection care  explained. -Advised patient to continue with her long-term gout medication she has allopurinol and colchicine -Advised patient states she has a history of gout she should follow-up with her PCP or be referred to a rheumatologist for further management -Advised patient to call if symptoms are not improved within 1 week -Patient to return to office as needed or sooner if condition worsens.  Asencion Islam, DPM

## 2020-10-20 ENCOUNTER — Ambulatory Visit (INDEPENDENT_AMBULATORY_CARE_PROVIDER_SITE_OTHER): Payer: BC Managed Care – PPO | Admitting: Cardiology

## 2020-10-20 ENCOUNTER — Other Ambulatory Visit: Payer: Self-pay

## 2020-10-20 ENCOUNTER — Encounter: Payer: Self-pay | Admitting: Cardiology

## 2020-10-20 VITALS — BP 140/62 | HR 91 | Ht 61.0 in | Wt 234.0 lb

## 2020-10-20 DIAGNOSIS — I255 Ischemic cardiomyopathy: Secondary | ICD-10-CM | POA: Diagnosis not present

## 2020-10-20 DIAGNOSIS — E785 Hyperlipidemia, unspecified: Secondary | ICD-10-CM | POA: Diagnosis not present

## 2020-10-20 DIAGNOSIS — I2510 Atherosclerotic heart disease of native coronary artery without angina pectoris: Secondary | ICD-10-CM

## 2020-10-20 DIAGNOSIS — J42 Unspecified chronic bronchitis: Secondary | ICD-10-CM | POA: Diagnosis not present

## 2020-10-20 MED ORDER — DILTIAZEM HCL ER COATED BEADS 120 MG PO CP24: 120.0000 mg | ORAL_CAPSULE | Freq: Every day | ORAL | 1 refills | Status: DC

## 2020-10-20 NOTE — Patient Instructions (Signed)
Medication Instructions:  Your physician has recommended you make the following change in your medication:   START: Cardizem 120 mg daily  *If you need a refill on your cardiac medications before your next appointment, please call your pharmacy*   Lab Work: None If you have labs (blood work) drawn today and your tests are completely normal, you will receive your results only by: Marland Kitchen MyChart Message (if you have MyChart) OR . A paper copy in the mail If you have any lab test that is abnormal or we need to change your treatment, we will call you to review the results.   Testing/Procedures: None   Follow-Up: At [facility], you and your health needs are our priority.  As part of our continuing mission to provide you with exceptional heart care, we have created designated Provider Care Teams.  These Care Teams include your primary Cardiologist (physician) and Advanced Practice Providers (APPs -  Physician Assistants and Nurse Practitioners) who all work together to provide you with the care you need, when you need it.  We recommend signing up for the patient portal called "MyChart".  Sign up information is provided on this After Visit Summary.  MyChart is used to connect with patients for Virtual Visits (Telemedicine).  Patients are able to view lab/test results, encounter notes, upcoming appointments, etc.  Non-urgent messages can be sent to your provider as well.   To learn more about what you can do with MyChart, go to [email or web address].    Your next appointment:   4 month(s)  The format for your next appointment:   In Person  Provider:   Gypsy Balsam, MD   Other Instructions  Diltiazem Extended-Release Oral Capsules or Tablets What is this medicine? DILTIAZEM (dil TYE a zem) is a calcium channel blocker. It relaxes your blood vessels and decreases the amount of work the heart has to do. It treats high blood pressure and/or prevents chest pain (also called  angina). This medicine may be used for other purposes; ask your health care provider or pharmacist if you have questions. COMMON BRAND NAME(S): Cardizem CD, Cardizem LA, Cardizem SR, Cartia XT, Dilacor XR, Dilt-CD, Diltia XT, Diltzac, Matzim LA, Verna Czech, TIADYLT ER, Tiamate, Tiazac What should I tell my health care provider before I take this medicine? They need to know if you have any of these conditions:  heart attack  heart disease  irregular heartbeat or rhythm  low blood pressure  an unusual or allergic reaction to diltiazem, other drugs, foods, dyes, or preservatives  pregnant or trying to get pregnant  breast-feeding How should I use this medicine? Take this drug by mouth. Take it as directed on the prescription label at the same time every day. Do not cut, crush or chew this drug. Swallow the capsules whole. You can take it with or without food. If it upsets your stomach, take it with food. Keep taking it unless your health care provider tells you to stop. Talk to your health care provider about the use of this drug in children. Special care may be needed. Overdosage: If you think you have taken too much of this medicine contact a poison control center or emergency room at once. NOTE: This medicine is only for you. Do not share this medicine with others. What if I miss a dose? If you miss a dose, take it as soon as you can. If it is almost time for your next dose, take only that dose. Do not take double or extra  doses. What may interact with this medicine? Do not take this medicine with any of the following medications:  cisapride  hawthorn  pimozide  ranolazine  red yeast rice This medicine may also interact with the following medications:  buspirone  carbamazepine  cimetidine  cyclosporine  digoxin  local anesthetics or general anesthetics  lovastatin  medicines for anxiety or difficulty sleeping like midazolam and triazolam  medicines for high  blood pressure or heart problems  quinidine  rifampin, rifabutin, or rifapentine This list may not describe all possible interactions. Give your health care provider a list of all the medicines, herbs, non-prescription drugs, or dietary supplements you use. Also tell them if you smoke, drink alcohol, or use illegal drugs. Some items may interact with your medicine. What should I watch for while using this medicine? Visit your health care provider for regular checks on your progress. Check your blood pressure as directed. Ask your health care provider what your blood pressure should be. Also, find out when you should contact him or her. Do not treat yourself for coughs, colds, or pain while you are using this drug without asking your health care provider for advice. Some drugs may increase your blood pressure. This drug may cause serious skin reactions. They can happen weeks to months after starting the drug. Contact your health care provider right away if you notice fevers or flu-like symptoms with a rash. The rash may be red or purple and then turn into blisters or peeling of the skin. Or, you might notice a red rash with swelling of the face, lips or lymph nodes in your neck or under your arms. You may get drowsy or dizzy. Do not drive, use machinery, or do anything that needs mental alertness until you know how this drug affects you. Do not stand up or sit up quickly, especially if you are an older patient. This reduces the risk of dizzy or fainting spells. What side effects may I notice from receiving this medicine? Side effects that you should report to your doctor or health care provider as soon as possible:  allergic reactions (skin rash, itching or hives; swelling of the face, lips, or tongue)  heart failure (trouble breathing; fast, irregular heartbeat; sudden weight gain; swelling of the ankles, feet, hands; unusually weak or tired)  heartbeat rhythm changes (trouble breathing; chest  pain; dizziness; fast, irregular heartbeat; feeling faint or lightheaded, falls)  liver injury (dark yellow or brown urine; general ill feeling or flu-like symptoms; loss of appetite, right upper belly pain; unusually weak or tired, yellowing of the eyes or skin)  redness, blistering, peeling, or loosening of the skin, including inside the mouth Side effects that usually do not require medical attention (report to your doctor or health care provider if they continue or are bothersome):  changes in sex drive or performance  changes in vision  cough  depressed mood  headache  nasal congestion (like runny or stuffy nose)  sudden weight gain  trouble sleeping This list may not describe all possible side effects. Call your doctor for medical advice about side effects. You may report side effects to FDA at 1-800-FDA-1088. Where should I keep my medicine? Keep out of the reach of children and pets. Store at room temperature between 20 and 25 degrees C (68 and 77 degrees F). Protect from moisture. Keep the container tightly closed. Throw away any unused drug after the expiration date. NOTE: This sheet is a summary. It may not cover all possible information.  If you have questions about this medicine, talk to your doctor, pharmacist, or health care provider.  2021 Elsevier/Gold Standard (2019-03-18 14:48:13)

## 2020-10-20 NOTE — Addendum Note (Signed)
Addended by: Hazle Quant on: 10/20/2020 10:09 AM   Modules accepted: Orders

## 2020-10-20 NOTE — Progress Notes (Signed)
Cardiology Office Note:    Date:  10/20/2020   ID:  Katie Rasmussen, DOB [birth year], MRN [id number]  PCP:  Tamsen Snider, NP  Cardiologist:  Gypsy Balsam, MD    Referring MD: Tamsen Snider, NP   Chief Complaint  Patient presents with  . Shortness of Breath  . elevated heart rate    History of Present Illness:    Katie Rasmussen is a 50 y.o. female with very complex past medical history which include history of cardiomyopathy with normalization, latest echocardiogram in October 2021 showing normal left ventricle ejection fraction, she does have diastolic dysfunction, advanced COPD with chronic lung issues, chronic respiratory failure, hypothyroidism, obesity, coronary artery disease with PTCA and stenting of the proximal LAD done in March 2019.  Recently in February she suffered from COVID-19 infection she ended going to the hospital required steroids.  Likely recovered reasonably well from it she said she 75% back to what she was before but she said she was very sick.  She described to have some palpitations.  She did wear monitor placed by primary care physician which showed some SVTs.  She said since the time of Covid this is what is much worse it can happen when she sits when she was treated for which she does do nothing and simply fast heartbeats which bothers her a lot.  There is no chest pain tightness squeezing pressure burning in the chest associated with this sensation.  Past Medical History:  Diagnosis Date  . Acute combined systolic and diastolic CHF, NYHA class 3 (HCC)   . Acute on chronic respiratory failure with hypoxia and hypercapnia (HCC) 09/14/2017  . Ankle sprain 02/18/2020  . Asthma exacerbation 02/18/2020  . Bronchitis due to tobacco use 08/2017  . Chest pain 02/18/2020  . Chronic respiratory failure (HCC)   . CKD (chronic kidney disease), stage III (HCC)   . COPD (chronic obstructive pulmonary disease) (HCC)   . Coronary artery disease    . DCM (dilated cardiomyopathy) (HCC)   . Diastolic CHF (HCC)   . Dyslipidemia 10/08/2017  . Dyspnea on exertion 05/07/2018  . Elevated troponin   . Gout   . Hypertension 02/18/2020  . Hypothyroidism    h/o Grave's, s/p thyroidectomy & RAI in 1990s  . Ischemic cardiomyopathy initial ejection fraction 35 to 40% in March 2019, normalization of ejection fraction based on echocardiogram from 2020 10/08/2017   Ejection fraction 35-40% in March 2019  . Leukocytosis 02/18/2020  . Obesity hypoventilation syndrome (HCC)   . Orthostatic hypotension 02/18/2020  . OSA (obstructive sleep apnea) 02/18/2020  . Peripheral edema 02/18/2020  . Pneumonia 08/2017  . Postablative hypothyroidism 04/03/2019  . Respiratory acidosis 02/18/2020  . Sepsis (HCC) 02/18/2020  . Supraventricular tachycardia (HCC) 07/08/2020  . Systolic CHF with reduced left ventricular function, NYHA class 3 (HCC) 09/13/2017  . Upper respiratory infection 02/18/2020  . Weakness 02/18/2020    Past Surgical History:  Procedure Laterality Date  . CESAREAN SECTION    . CORONARY STENT INTERVENTION N/A 09/18/2017   Procedure: CORONARY STENT INTERVENTION;  Surgeon: Tonny Bollman, MD;  Location: [facility] INVASIVE CV LAB;  Service: Cardiovascular;  Laterality: N/A;  prox LAD  . LEFT HEART CATH AND CORONARY ANGIOGRAPHY N/A 09/18/2017   Procedure: LEFT HEART CATH AND CORONARY ANGIOGRAPHY;  Surgeon: Tonny Bollman, MD;  Location[facility] INVASIVE CV LAB;  Service: Cardiovascular;  Laterality: N/A;  . THYROID SURGERY     s/p RAI for Grave's    Current Medications:  Current Meds  Medication Sig  . albuterol (PROVENTIL) (2.5 MG/3ML) 0.083% nebulizer solution Take 3 mLs (2.5 mg total) by nebulization every 6 (six) hours as needed for wheezing or shortness of breath.  . allopurinol (ZYLOPRIM) 100 MG tablet Take 50 mg by mouth daily.  Marland Kitchen ALPRAZolam (XANAX) 0.25 MG tablet Take 1 tablet (0.25 mg total) by mouth at bedtime as needed for anxiety.  Marland Kitchen aspirin EC 81 MG  EC tablet Take 1 tablet (81 mg total) by mouth daily.  Marland Kitchen atorvastatin (LIPITOR) 20 MG tablet Take 20 mg by mouth daily.  . clopidogrel (PLAVIX) 75 MG tablet TAKE 1 TABLET BY MOUTH ONCE DAILY WITH BREAKFAST. (Patient taking differently: Take 75 mg by mouth once.)  . cyanocobalamin (,VITAMIN B-12,) 1000 MCG/ML injection INJECT SUBQ ONCE MONTHLY. (Patient taking differently: Inject 100 mcg into the muscle every 30 (thirty) days.)  . fluticasone furoate-vilanterol (BREO ELLIPTA) 200-25 MCG/INH AEPB Inhale 1 puff into the lungs daily.  . metolazone (ZAROXOLYN) 2.5 MG tablet Take 1 tablet (2.5 mg total) by mouth once a week. 30 min before morning dose of lasix.  Marland Kitchen metoprolol succinate (TOPROL-XL) 25 MG 24 hr tablet TAKE 1/2 TABLET BY MOUTH ONCE DAILY, TAKE WITH OR IMMEDIATELY FOLLOWING A MEAL. (Patient taking differently: Take 12.5 mg by mouth daily.)  . SYNTHROID 175 MCG tablet TAKE 1 TABLET BY MOUTH DAILY. *NEEDS APPOINTMENT* (Patient taking differently: Take 175 mcg by mouth daily before breakfast.)  . torsemide (DEMADEX) 20 MG tablet TAKE 1 TABLET BY MOUTH TWICE DAILY. (Patient taking differently: Take 20 mg by mouth daily.)  . VENTOLIN HFA 108 (90 Base) MCG/ACT inhaler INHALE 2 PUFFS BY MOUTH EVERY 4 HOURS ASNEEDED FOR SHORTNESS OF BREATH. (Patient taking differently: Inhale 2 puffs into the lungs every 4 (four) hours as needed for wheezing or shortness of breath.)  . Vitamin D, Ergocalciferol, (DRISDOL) 1.25 MG (50000 UNIT) CAPS capsule Take 50,000 Units by mouth once a week.  . [DISCONTINUED] gabapentin (NEURONTIN) 100 MG capsule Take 100 mg by mouth 3 (three) times daily.     Allergies:   Enoxaparin sodium, Iodine, Lovenox [enoxaparin], No known allergies, Sulfa antibiotics, and Shellfish allergy   Social History   Socioeconomic History  . Marital status: Married    Spouse name: Not on file  . Number of children: 3  . Years of education: Not on file  . Highest education level: Not on  file  Occupational History  . 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 Smoking status: Former Smoker    Packs/day: 0.50    Years: 14.00    Pack years: 7.00    Types: Cigarettes    Quit date: 09/12/2017    Years since quitting: 3.1  . Smokeless tobacco: Never Used  Vaping Use  . Vaping Use: Never used  Substance and Sexual Activity  . Alcohol use: No  . Drug use: No  . Sexual activity: Not on file  Other Topics Concern  . Not on file  Social History Narrative  . Not on file   Social Determinants of Health   Financial Resource Strain: Not on file  Food Insecurity: Not on file  Transportation Needs: Not on file  Physical Activity: Not on file  Stress: Not on file  Social Connections: Not on file     Family History: The patient's family history includes Diabetes in her father; Heart disease in her father and mother; Hypertension in her mother. ROS:   Please  see the history of present illness.    All 14 point review of systems negative except as described per history of present illness  EKGs/Labs/Other Studies Reviewed:      Recent Labs: 02/19/2020: Hemoglobin 12.4; Platelets 331 07/08/2020: BUN 12; Creatinine, Ser 1.26; Potassium 4.2; Sodium 139; TSH 3.470  Recent Lipid Panel No results found for: CHOL, TRIG, HDL, CHOLHDL, VLDL, LDLCALC, LDLDIRECT  Physical Exam:    VS:  BP 140/62 (BP Location: Left Arm, Patient Position: Sitting)   Pulse 91   Ht 5\' 1"  (1.549 m)   Wt 234 lb (106.1 kg)   SpO2 96%   BMI 44.21 kg/m     Wt Readings from Last 3 Encounters:  10/20/20 234 lb (106.1 kg)  07/08/20 252 lb (114.3 kg)  04/02/20 260 lb (117.9 kg)     GEN:  Well nourished, well developed in no acute distress HEENT: Normal NECK: No JVD; No carotid bruits LYMPHATICS: No lymphadenopathy CARDIAC: RRR, no murmurs, no rubs, no gallops RESPIRATORY:  Clear to auscultation without rales, wheezing or rhonchi  ABDOMEN: Soft, non-tender,  non-distended MUSCULOSKELETAL:  No edema; No deformity  SKIN: Warm and dry LOWER EXTREMITIES: no swelling NEUROLOGIC:  Alert and oriented x 3 PSYCHIATRIC:  Normal affect   ASSESSMENT:    1. Ischemic cardiomyopathy initial ejection fraction 35 to 40% in March 2019, normalization of ejection fraction based on echocardiogram from 2020   2. Coronary artery disease involving native coronary artery of native heart without angina pectoris   3. Chronic bronchitis, unspecified chronic bronchitis type (HCC)   4. Dyslipidemia    PLAN:    In order of problems listed above:  1. History of ischemic cardiomyopathy with normalization.  We will continue present management. 2. Coronary artery disease denies have any symptoms that would suggest reactivation of the problem, 3. SVT.  Seems to be uncontrolled.  I will add Cardizem CD 120 to her medical regimen.  She is already on very small dose of beta-blocker which I prefer not to increase because of her chronic lung condition. 4. Dyslipidemia I do have data from 2020 from [facility] showing LDL of 67.  I will not recheck fasting lipid profile right now will wait for full recovery before doing it.   Medication Adjustments/Labs and Tests Ordered: Current medicines are reviewed at length with the patient today.  Concerns regarding medicines are outlined above.  No orders of the defined types were placed in this encounter.  Medication changes: No orders of the defined types were placed in this encounter.   Signed, 2021, MD, [facility] 10/20/2020 10:04 AM    [facility]

## 2020-11-09 ENCOUNTER — Ambulatory Visit: Payer: BC Managed Care – PPO | Admitting: Cardiology

## 2021-02-11 ENCOUNTER — Ambulatory Visit (INDEPENDENT_AMBULATORY_CARE_PROVIDER_SITE_OTHER): Payer: BC Managed Care – PPO | Admitting: Sports Medicine

## 2021-02-11 ENCOUNTER — Other Ambulatory Visit: Payer: Self-pay

## 2021-02-11 ENCOUNTER — Encounter: Payer: Self-pay | Admitting: Sports Medicine

## 2021-02-11 DIAGNOSIS — M79674 Pain in right toe(s): Secondary | ICD-10-CM | POA: Diagnosis not present

## 2021-02-11 DIAGNOSIS — M79675 Pain in left toe(s): Secondary | ICD-10-CM

## 2021-02-11 DIAGNOSIS — B351 Tinea unguium: Secondary | ICD-10-CM

## 2021-02-11 MED ORDER — CLOTRIMAZOLE 1 % EX SOLN: CUTANEOUS | 5 refills | Status: DC

## 2021-02-11 NOTE — Progress Notes (Signed)
Subjective: Katie Rasmussen is a 50 y.o. female patient seen today in office with complaint of bilateral hallux nails getting very brittle with lifting and coming off worse on the left states that she went for a pedicure and they would not even trim her toenails or do them due to the changes at her big toenail.  Patient does admit that she was getting gel polish put on her nails and after this she started noticing her nails changing.  Patient has no other pedal complaints at this time.  Patient denies any flareup of gout since her last visit in March.  Patient Active Problem List   Diagnosis Date Noted   Gout    Diastolic CHF (HCC)    Chronic respiratory failure (HCC)    Ankle sprain 02/18/2020   Asthma exacerbation 02/18/2020   Chest pain 02/18/2020   CKD (chronic kidney disease), stage III (HCC) 02/18/2020   Hypertension 02/18/2020   Hypothyroidism 02/18/2020   Leukocytosis 02/18/2020   OSA (obstructive sleep apnea) 02/18/2020   Peripheral edema 02/18/2020   Respiratory acidosis 02/18/2020   Sepsis (HCC) 02/18/2020   Upper respiratory infection 02/18/2020   Weakness 02/18/2020   Orthostatic hypotension 02/18/2020   Postablative hypothyroidism 04/03/2019   Dyspnea on exertion 05/07/2018   Ischemic cardiomyopathy initial ejection fraction 35 to 40% in March 2019, normalization of ejection fraction based on echocardiogram from 2020 10/08/2017   Coronary artery disease drug-eluting stent implanted to proximal LAD in March 2019 in face of acute myocardial infarction 10/08/2017   Dyslipidemia 10/08/2017   DCM (dilated cardiomyopathy) (HCC)    Elevated troponin    Acute on chronic respiratory failure with hypoxia and hypercapnia (HCC) 09/14/2017   Obesity hypoventilation syndrome (HCC)    Acute combined systolic and diastolic CHF, NYHA class 3 (HCC)    COPD (chronic obstructive pulmonary disease) (HCC)    Systolic CHF with reduced left ventricular function, NYHA class 3 (HCC)  09/13/2017   Pneumonia 08/10/2017   Bronchitis due to tobacco use 08/10/2017    Current Outpatient Medications on File Prior to Visit  Medication Sig Dispense Refill   albuterol (PROVENTIL) (2.5 MG/3ML) 0.083% nebulizer solution Take 3 mLs (2.5 mg total) by nebulization every 6 (six) hours as needed for wheezing or shortness of breath. 75 mL 12   allopurinol (ZYLOPRIM) 100 MG tablet Take 50 mg by mouth daily.     ALPRAZolam (XANAX) 0.25 MG tablet Take 1 tablet (0.25 mg total) by mouth at bedtime as needed for anxiety. 10 tablet 0   aspirin EC 81 MG EC tablet Take 1 tablet (81 mg total) by mouth daily.     atorvastatin (LIPITOR) 20 MG tablet Take 20 mg by mouth daily.     clopidogrel (PLAVIX) 75 MG tablet TAKE 1 TABLET BY MOUTH ONCE DAILY WITH BREAKFAST. (Patient taking differently: Take 75 mg by mouth once.) 90 tablet 2   cyanocobalamin (,VITAMIN B-12,) 1000 MCG/ML injection INJECT SUBQ ONCE MONTHLY. (Patient taking differently: Inject 100 mcg into the muscle every 30 (thirty) days.) 3 mL 4   diltiazem (CARDIZEM CD) 120 MG 24 hr capsule Take 1 capsule (120 mg total) by mouth daily. 90 capsule 1   fluticasone furoate-vilanterol (BREO ELLIPTA) 200-25 MCG/INH AEPB Inhale 1 puff into the lungs daily. 1 each 11   metolazone (ZAROXOLYN) 2.5 MG tablet Take 1 tablet (2.5 mg total) by mouth once a week. 30 min before morning dose of lasix. 30 tablet 0   metoprolol succinate (TOPROL-XL) 25 MG 24  hr tablet TAKE 1/2 TABLET BY MOUTH ONCE DAILY, TAKE WITH OR IMMEDIATELY FOLLOWING A MEAL. (Patient taking differently: Take 12.5 mg by mouth daily.) 45 tablet 3   SYNTHROID 175 MCG tablet TAKE 1 TABLET BY MOUTH DAILY. *NEEDS APPOINTMENT* (Patient taking differently: Take 175 mcg by mouth daily before breakfast.) 15 tablet 0   torsemide (DEMADEX) 20 MG tablet TAKE 1 TABLET BY MOUTH TWICE DAILY. (Patient taking differently: Take 20 mg by mouth daily.) 180 tablet 1   VENTOLIN HFA 108 (90 Base) MCG/ACT inhaler  INHALE 2 PUFFS BY MOUTH EVERY 4 HOURS ASNEEDED FOR SHORTNESS OF BREATH. (Patient taking differently: Inhale 2 puffs into the lungs every 4 (four) hours as needed for wheezing or shortness of breath.) 18 g 0   Vitamin D, Ergocalciferol, (DRISDOL) 1.25 MG (50000 UNIT) CAPS capsule Take 50,000 Units by mouth once a week.     No current facility-administered medications on file prior to visit.    Allergies  Allergen Reactions   Enoxaparin Sodium Other (See Comments)   Iodine Hives and Itching   Lovenox [Enoxaparin]    No Known Allergies Hives   Sulfa Antibiotics Other (See Comments)   Shellfish Allergy Hives and Itching    Objective: Physical Exam  General: Well developed, nourished, no acute distress, awake, alert and oriented x 3  Vascular: Dorsalis pedis artery 1/4 bilateral, Posterior tibial artery 1/4 bilateral, skin temperature warm to warm proximal to distal bilateral lower extremities, no varicosities, pedal hair present bilateral.  Neurological: Gross sensation present via light touch bilateral.   Dermatological: Skin is warm, dry, and supple bilateral, bilateral hallux nails are tender, short thick, and discolored with mild subungal debris, left hallux nail appears to be worse with splitting noted centrally, no webspace macerations present bilateral, no open lesions present bilateral, no callus/corns/hyperkeratotic tissue present bilateral. No signs of infection bilateral.  Musculoskeletal: No symptomatic boney deformities noted bilateral. Muscular strength within normal limits without painon range of motion. No pain with calf compression bilateral.  Assessment and Plan:  Problem List Items Addressed This Visit   None Visit Diagnoses     Nail fungus    -  Primary   Relevant Medications   clotrimazole (LOTRIMIN) 1 % external solution   Toe pain, bilateral           -Examined patient -Discussed treatment options for painful dystrophic nails  -Fungal culture was  obtained by removing a portion of the hard nail itself from each of the involved toenails using a sterile nail nipper and sent to [facility] lab. Patient tolerated the biopsy procedure well without discomfort or need for anesthesia.  -Advised patient to stop getting gel polish on her toes because this could be adding to her nail issue -Meanwhile prescribed clotrimazole solution for patient to use to her toenails until we get her culture results back  -Patient to return in 4 weeks for follow up evaluation and discussion of fungal culture results or sooner if symptoms worsen.  Asencion Islam, DPM

## 2021-02-14 ENCOUNTER — Other Ambulatory Visit: Payer: Self-pay

## 2021-02-14 ENCOUNTER — Telehealth: Payer: Self-pay | Admitting: Cardiology

## 2021-02-14 MED ORDER — CLOPIDOGREL BISULFATE 75 MG PO TABS: 75.0000 mg | ORAL_TABLET | Freq: Every day | ORAL | 1 refills | Status: DC

## 2021-02-14 MED ORDER — CLOPIDOGREL BISULFATE 75 MG PO TABS: 75.0000 mg | ORAL_TABLET | Freq: Every day | ORAL | 3 refills | Status: DC

## 2021-02-14 NOTE — Telephone Encounter (Signed)
Refill sent in per request.  

## 2021-02-14 NOTE — Telephone Encounter (Signed)
*  STAT* If patient is at the pharmacy, call can be transferred to refill team.   1. Which medications need to be refilled? (please list name of each medication and dose if known) clopidogrel (PLAVIX) 75 MG tablet  2. Which pharmacy/location (including street and city if local pharmacy) is medication to be sent to? [facility] - [place], [place] - [address]  3. Do they need a 30 day or 90 day supply? 90

## 2021-02-24 ENCOUNTER — Other Ambulatory Visit: Payer: Self-pay

## 2021-02-25 ENCOUNTER — Ambulatory Visit (INDEPENDENT_AMBULATORY_CARE_PROVIDER_SITE_OTHER): Payer: BC Managed Care – PPO | Admitting: Cardiology

## 2021-02-25 ENCOUNTER — Encounter: Payer: Self-pay | Admitting: Cardiology

## 2021-02-25 ENCOUNTER — Other Ambulatory Visit: Payer: Self-pay

## 2021-02-25 VITALS — BP 148/82 | HR 85 | Ht 61.0 in | Wt 251.0 lb

## 2021-02-25 DIAGNOSIS — J9611 Chronic respiratory failure with hypoxia: Secondary | ICD-10-CM

## 2021-02-25 DIAGNOSIS — I2510 Atherosclerotic heart disease of native coronary artery without angina pectoris: Secondary | ICD-10-CM

## 2021-02-25 DIAGNOSIS — I255 Ischemic cardiomyopathy: Secondary | ICD-10-CM | POA: Diagnosis not present

## 2021-02-25 DIAGNOSIS — I10 Essential (primary) hypertension: Secondary | ICD-10-CM

## 2021-02-25 DIAGNOSIS — E785 Hyperlipidemia, unspecified: Secondary | ICD-10-CM

## 2021-02-25 MED ORDER — DILTIAZEM HCL 30 MG PO TABS: 30.0000 mg | ORAL_TABLET | Freq: Four times a day (QID) | ORAL | 3 refills | Status: DC | PRN

## 2021-02-25 MED ORDER — METOPROLOL SUCCINATE ER 25 MG PO TB24: 25.0000 mg | ORAL_TABLET | Freq: Every day | ORAL | 3 refills | Status: DC

## 2021-02-25 NOTE — Patient Instructions (Signed)
Medication Instructions:  Your physician has recommended you make the following change in your medication:  INCREASE: Metoprolol succinate 25 mg by mouth once daily.  START: Cardizem 30 mg take one tablet by mouth every 6 hours as needed for palpitations.  *If you need a refill on your cardiac medications before your next appointment, please call your pharmacy*   Lab Work: Your physician recommends that you return for lab work in: TODAY Lipids, CBC If you have labs (blood work) drawn today and your tests are completely normal, you will receive your results only by: MyChart Message (if you have MyChart) OR A paper copy in the mail If you have any lab test that is abnormal or we need to change your treatment, we will call you to review the results.   Testing/Procedures: None   Follow-Up: At [facility], you and your health needs are our priority.  As part of our continuing mission to provide you with exceptional heart care, we have created designated Provider Care Teams.  These Care Teams include your primary Cardiologist (physician) and Advanced Practice Providers (APPs -  Physician Assistants and Nurse Practitioners) who all work together to provide you with the care you need, when you need it.  We recommend signing up for the patient portal called "MyChart".  Sign up information is provided on this After Visit Summary.  MyChart is used to connect with patients for Virtual Visits (Telemedicine).  Patients are able to view lab/test results, encounter notes, upcoming appointments, etc.  Non-urgent messages can be sent to your provider as well.   To learn more about what you can do with MyChart, go to [email or web address].    Your next appointment:   3 month(s)  The format for your next appointment:   In Person  Provider:   Gypsy Balsam, MD   Other Instructions

## 2021-02-25 NOTE — Progress Notes (Signed)
Cardiology Office Note:    Date:  02/25/2021   ID:  Katie Rasmussen, DOB Oct 03, 1970, MRN [id number]  PCP:  Tamsen Snider, NP  Cardiologist:  Gypsy Balsam, MD    Referring MD: Tamsen Snider, NP   No chief complaint on file. I still have some episode of palpitations  History of Present Illness:    Katie Rasmussen is a 50 y.o. female   with very complex past medical history which include history of cardiomyopathy with normalization, latest echocardiogram in October 2021 showing normal left ventricle ejection fraction, she does have diastolic dysfunction, advanced COPD with chronic lung issues, chronic respiratory failure, hypothyroidism, obesity, coronary artery disease with PTCA and stenting of the proximal LAD done in March 2019.  Recently in February she suffered from COVID-19 infection she ended going to the hospital required steroids.  Likely recovered reasonably well from it she said she 50% back to what she was before but she said she was very sick.  She described to have some palpitations.  She did wear monitor placed by primary care physician which showed some SVTs Overall she seems to be doing well recently she did have some issue with her lungs and that being in pulmonary office he was she was given steroids as well as antibiotic.  Started doing better.  What bothers her and scared her a lot palpitations we will gently try to increase the dose of metoprolol see if we help with that also give her prescription for Cardizem 30 mg ask her to take when she got a lot of palpitations.  If that will not work she may require long-acting Cardizem.  Past Medical History:  Diagnosis Date   Acute combined systolic and diastolic CHF, NYHA class 3 (HCC)    Acute on chronic respiratory failure with hypoxia and hypercapnia (HCC) 09/14/2017   Ankle sprain 02/18/2020   Asthma exacerbation 02/18/2020   Bronchitis due to tobacco use 08/2017   Chest pain 02/18/2020   Chronic  respiratory failure (HCC)    CKD (chronic kidney disease), stage III (HCC)    COPD (chronic obstructive pulmonary disease) (HCC)    Coronary artery disease    DCM (dilated cardiomyopathy) (HCC)    Diastolic CHF (HCC)    Dyslipidemia 10/08/2017   Dyspnea on exertion 05/07/2018   Elevated troponin    Gout    Hypertension 02/18/2020   Hypothyroidism    h/o Grave's, s/p thyroidectomy & RAI in 1990s   Ischemic cardiomyopathy initial ejection fraction 35 to 40% in March 2019, normalization of ejection fraction based on echocardiogram from 2020 10/08/2017   Ejection fraction 35-40% in March 2019   Leukocytosis 02/18/2020   Obesity hypoventilation syndrome (HCC)    Orthostatic hypotension 02/18/2020   OSA (obstructive sleep apnea) 02/18/2020   Peripheral edema 02/18/2020   Pneumonia 08/2017   Postablative hypothyroidism 04/03/2019   Respiratory acidosis 02/18/2020   Sepsis (HCC) 02/18/2020   Supraventricular tachycardia (HCC) 07/08/2020   Systolic CHF with reduced left ventricular function, NYHA class 3 (HCC) 09/13/2017   Upper respiratory infection 02/18/2020   Weakness 02/18/2020    Past Surgical History:  Procedure Laterality Date   CESAREAN SECTION     CORONARY STENT INTERVENTION N/A 09/18/2017   Procedure: CORONARY STENT INTERVENTION;  Surgeon: Tonny Bollman, MD;  Location: [facility] INVASIVE CV LAB;  Service: Cardiovascular;  Laterality: N/A;  prox LAD   LEFT HEART CATH AND CORONARY ANGIOGRAPHY N/A 09/18/2017   Procedure: LEFT HEART CATH AND CORONARY ANGIOGRAPHY;  Surgeon:  Tonny Bollman, MD;  Location: [facility] INVASIVE CV LAB;  Service: Cardiovascular;  Laterality: N/A;   THYROID SURGERY     s/p RAI for Grave's    Current Medications: No outpatient medications have been marked as taking for the 02/25/21 encounter (Appointment) with Georgeanna Lea, MD.     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  Tobacco Use   Smoking status: Former    Packs/day: 0.50    Years: 14.00    Pack years: 7.00    Types: Cigarettes    Quit date: 09/12/2017    Years since quitting: 3.4   Smokeless tobacco: Never  Vaping Use   Vaping Use: Never used  Substance and Sexual Activity   Alcohol use: No   Drug use: No   Sexual activity: Not on file  Other Topics Concern   Not on file  Social History Narrative   Not on file   Social Determinants of Health   Financial Resource Strain: Not on file  Food Insecurity: Not on file  Transportation Needs: Not on file  Physical Activity: Not on file  Stress: Not on file  Social Connections: Not on file     Family History: The patient's family history includes Diabetes in her father; Heart disease in her father and mother; Hypertension in her mother. ROS:   Please see the history of present illness.    All 14 point review of systems negative except as described per history of present illness  EKGs/Labs/Other Studies Reviewed:      Recent Labs: 07/08/2020: BUN 12; Creatinine, Ser 1.26; Potassium 4.2; Sodium 139; TSH 3.470  Recent Lipid Panel No results found for: CHOL, TRIG, HDL, CHOLHDL, VLDL, LDLCALC, LDLDIRECT  Physical Exam:    VS:  There were no vitals taken for this visit.    Wt Readings from Last 3 Encounters:  10/20/20 234 lb (106.1 kg)  07/08/20 252 lb (114.3 kg)  04/02/20 260 lb (117.9 kg)     GEN:  Well nourished, well developed in no acute distress HEENT: Normal NECK: No JVD; No carotid bruits LYMPHATICS: No lymphadenopathy CARDIAC: RRR, no murmurs, no rubs, no gallops RESPIRATORY:  Clear to auscultation without rales, wheezing or rhonchi  ABDOMEN: Soft, non-tender,  non-distended MUSCULOSKELETAL:  No edema; No deformity  SKIN: Warm and dry LOWER EXTREMITIES: no swelling NEUROLOGIC:  Alert and oriented x 3 PSYCHIATRIC:  Normal affect   ASSESSMENT:    1. Ischemic cardiomyopathy initial ejection fraction 35 to 40% in March 2019, normalization of ejection fraction based on echocardiogram from 2020   2. Coronary artery disease involving native coronary artery of native heart without angina pectoris   3. Primary hypertension   4. Dyslipidemia   5. Chronic respiratory failure with hypoxia (HCC)    PLAN:    In order of problems listed above:  Ischemic cardiomyopathy last echocardiogram showed improvement left ventricle ejection fraction we will try to get copy from hospital history of recently done Coronary disease stable from that point review. Supraventricular tachycardia still not well controlled.  We will increase dose of beta-blocker carefully because of her chronic lung condition.  Dyslipidemia we will check her fasting lipid profile today   Medication Adjustments/Labs and Tests Ordered: Current medicines are reviewed at length with the patient today.  Concerns regarding medicines are outlined above.  No orders of the defined types were placed in this encounter.  Medication changes: No orders of the defined types were placed in this encounter.   Signed, Georgeanna Lea, MD, [facility] 02/25/2021 12:43 PM    [facility]

## 2021-02-26 LAB — CBC
Hematocrit: 40.4 % (ref 34.0–46.6)
Hemoglobin: 13.5 g/dL (ref 11.1–15.9)
MCH: 31 pg (ref 26.6–33.0)
MCHC: 33.4 g/dL (ref 31.5–35.7)
MCV: 93 fL (ref 79–97)
Platelets: 300 10*3/uL (ref 150–450)
RBC: 4.35 x10E6/uL (ref 3.77–5.28)
RDW: 13.4 % (ref 11.7–15.4)
WBC: 11.9 10*3/uL — ABNORMAL HIGH (ref 3.4–10.8)

## 2021-02-26 LAB — LIPID PANEL
Chol/HDL Ratio: 3.5 ratio (ref 0.0–4.4)
Cholesterol, Total: 224 mg/dL — ABNORMAL HIGH (ref 100–199)
HDL: 64 mg/dL (ref >39)
LDL Chol Calc (NIH): 104 mg/dL — ABNORMAL HIGH (ref 0–99)
Triglycerides: 335 mg/dL — ABNORMAL HIGH (ref 0–149)
VLDL Cholesterol Cal: 56 mg/dL — ABNORMAL HIGH (ref 5–40)

## 2021-03-01 MED ORDER — ATORVASTATIN CALCIUM 40 MG PO TABS: 40.0000 mg | ORAL_TABLET | Freq: Every day | ORAL | 3 refills | Status: DC

## 2021-03-01 NOTE — Addendum Note (Signed)
Addended by: Eleonore Chiquito on: 03/01/2021 10:15 AM   Modules accepted: Orders

## 2021-03-18 ENCOUNTER — Ambulatory Visit (INDEPENDENT_AMBULATORY_CARE_PROVIDER_SITE_OTHER): Payer: BC Managed Care – PPO | Admitting: Sports Medicine

## 2021-03-18 ENCOUNTER — Other Ambulatory Visit: Payer: Self-pay

## 2021-03-18 DIAGNOSIS — B351 Tinea unguium: Secondary | ICD-10-CM

## 2021-03-18 DIAGNOSIS — M79675 Pain in left toe(s): Secondary | ICD-10-CM

## 2021-03-18 DIAGNOSIS — M79674 Pain in right toe(s): Secondary | ICD-10-CM | POA: Diagnosis not present

## 2021-03-18 NOTE — Progress Notes (Signed)
Subjective: Katie Rasmussen is a 50 y.o. female patient seen today in office for fungal culture results. Patient has no other pedal complaints at this time.   Patient Active Problem List   Diagnosis Date Noted   Gout    Diastolic CHF (HCC)    Chronic respiratory failure (HCC)    Ankle sprain 02/18/2020   Asthma exacerbation 02/18/2020   Chest pain 02/18/2020   CKD (chronic kidney disease), stage III (HCC) 02/18/2020   Hypertension 02/18/2020   Hypothyroidism 02/18/2020   Leukocytosis 02/18/2020   OSA (obstructive sleep apnea) 02/18/2020   Peripheral edema 02/18/2020   Respiratory acidosis 02/18/2020   Sepsis (HCC) 02/18/2020   Upper respiratory infection 02/18/2020   Weakness 02/18/2020   Orthostatic hypotension 02/18/2020   Postablative hypothyroidism 04/03/2019   Dyspnea on exertion 05/07/2018   Ischemic cardiomyopathy initial ejection fraction 35 to 40% in March 2019, normalization of ejection fraction based on echocardiogram from 2020 10/08/2017   Coronary artery disease drug-eluting stent implanted to proximal LAD in March 2019 in face of acute myocardial infarction 10/08/2017   Dyslipidemia 10/08/2017   DCM (dilated cardiomyopathy) (HCC)    Elevated troponin    Acute on chronic respiratory failure with hypoxia and hypercapnia (HCC) 09/14/2017   Obesity hypoventilation syndrome (HCC)    Acute combined systolic and diastolic CHF, NYHA class 3 (HCC)    COPD (chronic obstructive pulmonary disease) (HCC)    Systolic CHF with reduced left ventricular function, NYHA class 3 (HCC) 09/13/2017   Pneumonia 08/10/2017   Bronchitis due to tobacco use 08/10/2017    Current Outpatient Medications on File Prior to Visit  Medication Sig Dispense Refill   albuterol (PROVENTIL) (2.5 MG/3ML) 0.083% nebulizer solution Take 3 mLs (2.5 mg total) by nebulization every 6 (six) hours as needed for wheezing or shortness of breath. 75 mL 12   allopurinol (ZYLOPRIM) 100 MG tablet Take 50  mg by mouth daily.     ALPRAZolam (XANAX) 0.25 MG tablet Take 1 tablet (0.25 mg total) by mouth at bedtime as needed for anxiety. 10 tablet 0   aspirin EC 81 MG EC tablet Take 1 tablet (81 mg total) by mouth daily.     atorvastatin (LIPITOR) 40 MG tablet Take 1 tablet (40 mg total) by mouth daily. 90 tablet 3   clopidogrel (PLAVIX) 75 MG tablet Take 1 tablet (75 mg total) by mouth daily with breakfast. 90 tablet 3   clotrimazole (LOTRIMIN) 1 % external solution Once daily to toenails after shower (Patient taking differently: Apply 1 application topically daily. Once daily to toenails after shower) 60 mL 5   cyanocobalamin (,VITAMIN B-12,) 1000 MCG/ML injection INJECT SUBQ ONCE MONTHLY. (Patient taking differently: Inject 100 mcg into the muscle every 30 (thirty) days.) 3 mL 4   diltiazem (CARDIZEM CD) 120 MG 24 hr capsule Take 1 capsule (120 mg total) by mouth daily. (Patient not taking: Reported on 02/25/2021) 90 capsule 1   diltiazem (CARDIZEM) 30 MG tablet Take 1 tablet (30 mg total) by mouth every 6 (six) hours as needed (For palpitations). 30 tablet 3   fluticasone furoate-vilanterol (BREO ELLIPTA) 200-25 MCG/INH AEPB Inhale 1 puff into the lungs daily. 1 each 11   levothyroxine (SYNTHROID) 100 MCG tablet Take 100 mcg by mouth daily before breakfast.     metolazone (ZAROXOLYN) 2.5 MG tablet Take 1 tablet (2.5 mg total) by mouth once a week. 30 min before morning dose of lasix. 30 tablet 0   metoprolol succinate (TOPROL XL) 25  MG 24 hr tablet Take 1 tablet (25 mg total) by mouth daily. 90 tablet 3   torsemide (DEMADEX) 20 MG tablet TAKE 1 TABLET BY MOUTH TWICE DAILY. (Patient taking differently: Take 20 mg by mouth daily.) 180 tablet 1   VENTOLIN HFA 108 (90 Base) MCG/ACT inhaler INHALE 2 PUFFS BY MOUTH EVERY 4 HOURS ASNEEDED FOR SHORTNESS OF BREATH. (Patient taking differently: Inhale 2 puffs into the lungs every 4 (four) hours as needed for wheezing or shortness of breath.) 18 g 0   Vitamin  D, Ergocalciferol, (DRISDOL) 1.25 MG (50000 UNIT) CAPS capsule Take 50,000 Units by mouth once a week.     No current facility-administered medications on file prior to visit.    Allergies  Allergen Reactions   Enoxaparin Sodium Other (See Comments)   Iodine Hives and Itching   Lovenox [Enoxaparin]    No Known Allergies Hives   Sulfa Antibiotics Other (See Comments)   Shellfish Allergy Hives and Itching    Objective: Physical Exam  General: Well developed, nourished, no acute distress, awake, alert and oriented x 3  Vascular: Dorsalis pedis artery 1/4 bilateral, Posterior tibial artery 1/4 bilateral, skin temperature warm to warm proximal to distal bilateral lower extremities, no varicosities, pedal hair present bilateral.   Neurological: Gross sensation present via light touch bilateral.    Dermatological: Skin is warm, dry, and supple bilateral, bilateral hallux nails are tender, short thick, and discolored with mild subungal debris, left hallux nail appears to be worse with splitting noted centrally, no webspace macerations present bilateral, no open lesions present bilateral, no callus/corns/hyperkeratotic tissue present bilateral. No signs of infection bilateral.   Musculoskeletal: Asymptomatic left bunion boney deformities noted. Muscular strength within normal limits without painon range of motion. No pain with calf compression bilateral.    Fungal culture + fungus saprophytic fungi and microtrauma  Assessment and Plan:  Problem List Items Addressed This Visit   None Visit Diagnoses     Nail fungus    -  Primary   Toe pain, bilateral           -Examined patient -Discussed treatment options for painful mycotic nails and trauma and advised patient that likely the traumatized component could be from her gait or from excessive and aggressive pedicures  -Patient opt for laser -Advised good hygiene habits -Patient to return laser for nail fungus or sooner if symptoms  worsen.  Asencion Islam, DPM

## 2021-03-28 ENCOUNTER — Other Ambulatory Visit: Payer: BC Managed Care – PPO

## 2021-04-20 ENCOUNTER — Other Ambulatory Visit: Payer: Self-pay | Admitting: Cardiology

## 2021-04-27 ENCOUNTER — Ambulatory Visit: Payer: BC Managed Care – PPO | Admitting: Endocrinology

## 2021-04-27 ENCOUNTER — Other Ambulatory Visit: Payer: Self-pay

## 2021-04-27 VITALS — BP 130/72 | HR 80 | Ht 61.0 in | Wt 265.0 lb

## 2021-04-27 DIAGNOSIS — E669 Obesity, unspecified: Secondary | ICD-10-CM | POA: Diagnosis not present

## 2021-04-27 DIAGNOSIS — E1169 Type 2 diabetes mellitus with other specified complication: Secondary | ICD-10-CM | POA: Diagnosis not present

## 2021-04-27 DIAGNOSIS — E890 Postprocedural hypothyroidism: Secondary | ICD-10-CM | POA: Diagnosis not present

## 2021-04-27 MED ORDER — LEVOTHYROXINE SODIUM 137 MCG PO TABS: 137.0000 ug | ORAL_TABLET | Freq: Every day | ORAL | 2 refills | Status: DC

## 2021-04-27 NOTE — Progress Notes (Signed)
Patient ID: Katie Rasmussen, female   DOB: [birth year], 50 y.o.   MRN: [id number]            Reason for Appointment:  Hypothyroidism, follow-up visit    History of Present Illness:   Hypothyroidism was first diagnosed in 1990  At the time of diagnosis patient had been treated with radioactive iodine for Graves' disease  History at initial visit: She has generally been followed by her primary care physician and monitored with regular thyroid levels However she is now concerned that she is having marked variability in her thyroid levels and dosage This year her dose has ranged between 100 mcg and 225 mcg  She says that when she has a low thyroid function level she has symptoms of  fatigue, decreased motivation, dry skin, weight gain, hair loss and some cold sensitivity. Otherwise if her thyroid level is too high she will have palpitations and feels jittery      About 6 months or so ago she was on 225 mcg of Synthroid but subsequently this was reduced because of high thyroid levels and she was down to 100 mcg subsequently In 7/20 her TSH was 23.7 and the dose was increased from 125 mcg up to 175 mcg  RECENT HISTORY  The patient has been treated with  brand-name Synthroid consistently She was previously taking 175 mcg since 01/2019 and this was continued on her last visit in 04/2019  She apparently had significant COVID infection in February 2022 resulting in significant weight loss Her PCP has been following her thyroid levels Recent history of the thyroid levels are as follows TSH in 3/22 was 13.7 but in April was below normal twice  She apparently was told to stop her levothyroxine temporarily and then only start with half of 25 mcg which was only gradually increased However her TSH went up significantly in 5/22 up to 69 but her doses have been increased only gradually up to 75 mcg until about 6 weeks ago when she was given additional 25 mcg Most recent TSH was 19.5 as of  04/12/2021 and currently she is taking 200 mcg levothyroxine  She has had symptoms of significant fatigue and depression as well as weight gain  She has been losing hair She has chronic cold intolerance  The patient takes the thyroid supplement at 6 AM as before very regularly without any food or coffee at that time Also does not take any iron or calcium-containing supplements at the same time         Patient's weight history is as follows:  Wt Readings from Last 3 Encounters:  04/27/21 265 lb (120.2 kg)  02/25/21 251 lb (113.9 kg)  10/20/20 234 lb (106.1 kg)    Thyroid function results in the chart as below  Lab Results  Component Value Date   TSH 3.470 07/08/2020   TSH 1.950 05/06/2019   TSH 0.066 (L) 05/07/2018   TSH 1.278 09/14/2017   FREET4 1.31 05/06/2019     Past Medical History:  Diagnosis Date   Acute combined systolic and diastolic CHF, NYHA class 3 (HCC)    Acute on chronic respiratory failure with hypoxia and hypercapnia (HCC) 09/14/2017   Ankle sprain 02/18/2020   Asthma exacerbation 02/18/2020   Bronchitis due to tobacco use 08/2017   Chest pain 02/18/2020   Chronic respiratory failure (HCC)    CKD (chronic kidney disease), stage III (HCC)    COPD (chronic obstructive pulmonary disease) (HCC)    Coronary artery disease  DCM (dilated cardiomyopathy) (HCC)    Diastolic CHF (HCC)    Dyslipidemia 10/08/2017   Dyspnea on exertion 05/07/2018   Elevated troponin    Gout    Hypertension 02/18/2020   Hypothyroidism    h/o Grave's, s/p thyroidectomy & RAI in 1990s   Ischemic cardiomyopathy initial ejection fraction 35 to 40% in March 2019, normalization of ejection fraction based on echocardiogram from 2020 10/08/2017   Ejection fraction 35-40% in March 2019   Leukocytosis 02/18/2020   Obesity hypoventilation syndrome (HCC)    Orthostatic hypotension 02/18/2020   OSA (obstructive sleep apnea) 02/18/2020   Peripheral edema 02/18/2020   Pneumonia 08/2017   Postablative  hypothyroidism 04/03/2019   Respiratory acidosis 02/18/2020   Sepsis (HCC) 02/18/2020   Supraventricular tachycardia (HCC) 07/08/2020   Systolic CHF with reduced left ventricular function, NYHA class 3 (HCC) 09/13/2017   Upper respiratory infection 02/18/2020   Weakness 02/18/2020    Past Surgical History:  Procedure Laterality Date   CESAREAN SECTION     CORONARY STENT INTERVENTION N/A 09/18/2017   Procedure: CORONARY STENT INTERVENTION;  Surgeon: Tonny Bollman, MD;  Location[facility]VASIVE CV LAB;  Service: Cardiovascular;  Laterality: N/A;  prox LAD   LEFT HEART CATH AND CORONARY ANGIOGRAPHY N/A 09/18/2017   Procedure: LEFT HEART CATH AND CORONARY ANGIOGRAPHY;  Surgeon: Tonny Bollman, MD;  Location: [facility] INVASIVE CV LAB;  Service: Cardiovascular;  Laterality: N/A;   THYROID SURGERY     s/p RAI for Grave's    Family History  Problem Relation Age of Onset   Heart disease Mother    Hypertension Mother    Heart disease Father    Diabetes Father     Social History:  reports that she quit smoking about 3 years ago. Her smoking use included cigarettes. She has a 7.00 pack-year smoking history. She has never used smokeless tobacco. She reports that she does not drink alcohol and does not use drugs.  Allergies:  Allergies  Allergen Reactions   Enoxaparin Sodium Other (See Comments)   Iodine Hives and Itching   Lovenox [Enoxaparin]    No Known Allergies Hives   Sulfa Antibiotics Other (See Comments)   Shellfish Allergy Hives and Itching    Allergies as of 04/27/2021       Reactions   Enoxaparin Sodium Other (See Comments)   Iodine Hives, Itching   Lovenox [enoxaparin]    No Known Allergies Hives   Sulfa Antibiotics Other (See Comments)   Shellfish Allergy Hives, Itching        Medication List        Accurate as of April 27, 2021 10:15 AM. If you have any questions, ask your nurse or doctor.          albuterol (2.5 MG/3ML) 0.083% nebulizer solution Commonly known  as: PROVENTIL Take 3 mLs (2.5 mg total) by nebulization every 6 (six) hours as needed for wheezing or shortness of breath. What changed: Another medication with the same name was changed. Make sure you understand how and when to take each.   Ventolin HFA 108 (90 Base) MCG/ACT inhaler Generic drug: albuterol INHALE 2 PUFFS BY MOUTH EVERY 4 HOURS ASNEEDED FOR SHORTNESS OF BREATH. What changed: See the new instructions.   allopurinol 100 MG tablet Commonly known as: ZYLOPRIM Take 50 mg by mouth daily.   ALPRAZolam 0.25 MG tablet Commonly known as: XANAX Take 1 tablet (0.25 mg total) by mouth at bedtime as needed for anxiety.   aspirin 81 MG EC tablet  Take 1 tablet (81 mg total) by mouth daily.   atorvastatin 40 MG tablet Commonly known as: LIPITOR Take 1 tablet (40 mg total) by mouth daily.   Breo Ellipta 200-25 MCG/ACT Aepb Generic drug: fluticasone furoate-vilanterol Inhale 1 puff into the lungs daily.   clopidogrel 75 MG tablet Commonly known as: PLAVIX Take 1 tablet (75 mg total) by mouth daily with breakfast.   clotrimazole 1 % external solution Commonly known as: LOTRIMIN Once daily to toenails after shower   cyanocobalamin 1000 MCG/ML injection Commonly known as: (VITAMIN B-12) INJECT SUBQ ONCE MONTHLY. What changed: See the new instructions.   diltiazem 120 MG 24 hr capsule Commonly known as: CARDIZEM CD Take 1 capsule (120 mg total) by mouth daily.   diltiazem 30 MG tablet Commonly known as: Cardizem Take 1 tablet (30 mg total) by mouth every 6 (six) hours as needed (For palpitations).   levothyroxine 100 MCG tablet Commonly known as: SYNTHROID Take 100 mcg by mouth daily before breakfast.   metolazone 2.5 MG tablet Commonly known as: ZAROXOLYN Take 1 tablet (2.5 mg total) by mouth once a week. 30 min before morning dose of lasix.   metoprolol succinate 25 MG 24 hr tablet Commonly known as: Toprol XL Take 1 tablet (25 mg total) by mouth daily.    torsemide 20 MG tablet Commonly known as: DEMADEX TAKE 1 TABLET BY MOUTH TWICE DAILY.   Vitamin D (Ergocalciferol) 1.25 MG (50000 UNIT) Caps capsule Commonly known as: DRISDOL Take 50,000 Units by mouth once a week.           Review of Systems       She is having complaints of palpitations but this started earlier this year when she had COVID infection        Examination:    BP 130/72   Pulse 80   Ht 5\' 1"  (1.549 m)   Wt 265 lb (120.2 kg)   SpO2 97% Comment: 2L  BMI 50.07 kg/m   No ankle edema present Skin appears mildly dry  Assessment:  HYPOTHYROIDISM, post radioactive iodine treatment  She previously had been on 175 mcg brand-name Synthroid with stable levels  Although she appeared to be requiring less levothyroxine and in April of this year she is only taking 100 mcg now compared to 175 as above Most recent TSH is about 19 indicating need to increase her medication She is still symptomatic with fatigue and hair loss as well as depression and weight gain Currently taking generic levothyroxine  She is very consistent with her thyroid supplement before breakfast daily  Mild diabetes with A1c 5.8: Followed by PCP  Also has a history of ischemic cardiomyopathy Discussed that her palpitations are unlikely to be from hypothyroidism, to follow-up with cardiologist  PLAN:   She will be started on 137 mcg of levothyroxine instead of 100 Follow-up labs will be in about 6 weeks  We will also discuss her diabetes management at that time and may consider adding Jardiance because of her history of CHF  Filbert Craze 04/27/2021, 10:15 AM        Note: This office note was prepared with Dragon voice recognition system technology. Any transcriptional errors that result from this process are unintentional.

## 2021-06-01 ENCOUNTER — Other Ambulatory Visit: Payer: Self-pay

## 2021-06-01 ENCOUNTER — Ambulatory Visit: Payer: BC Managed Care – PPO | Admitting: Cardiology

## 2021-06-01 ENCOUNTER — Encounter: Payer: Self-pay | Admitting: Cardiology

## 2021-06-01 VITALS — BP 166/82 | HR 98 | Ht 61.0 in | Wt 264.4 lb

## 2021-06-01 DIAGNOSIS — I2510 Atherosclerotic heart disease of native coronary artery without angina pectoris: Secondary | ICD-10-CM | POA: Diagnosis not present

## 2021-06-01 DIAGNOSIS — J42 Unspecified chronic bronchitis: Secondary | ICD-10-CM | POA: Diagnosis not present

## 2021-06-01 DIAGNOSIS — E785 Hyperlipidemia, unspecified: Secondary | ICD-10-CM

## 2021-06-01 DIAGNOSIS — I5032 Chronic diastolic (congestive) heart failure: Secondary | ICD-10-CM | POA: Diagnosis not present

## 2021-06-01 NOTE — Patient Instructions (Signed)
Medication Instructions:  Your physician has recommended you make the following change in your medication:  TAKE: Cardizem 120 mg daily  *If you need a refill on your cardiac medications before your next appointment, please call your pharmacy*   Lab Work: None If you have labs (blood work) drawn today and your tests are completely normal, you will receive your results only by: MyChart Message (if you have MyChart) OR A paper copy in the mail If you have any lab test that is abnormal or we need to change your treatment, we will call you to review the results.   Testing/Procedures: None   Follow-Up: At [facility], you and your health needs are our priority.  As part of our continuing mission to provide you with exceptional heart care, we have created designated Provider Care Teams.  These Care Teams include your primary Cardiologist (physician) and Advanced Practice Providers (APPs -  Physician Assistants and Nurse Practitioners) who all work together to provide you with the care you need, when you need it.  We recommend signing up for the patient portal called "MyChart".  Sign up information is provided on this After Visit Summary.  MyChart is used to connect with patients for Virtual Visits (Telemedicine).  Patients are able to view lab/test results, encounter notes, upcoming appointments, etc.  Non-urgent messages can be sent to your provider as well.   To learn more about what you can do with MyChart, go to [email or web address].    Your next appointment:   3 month(s)  The format for your next appointment:   In Person  Provider:   Gypsy Balsam, MD    Other Instructions

## 2021-06-01 NOTE — Progress Notes (Signed)
Cardiology Office Note:    Date:  06/01/2021   ID:  [name], DOB [birth year], MRN [id number]  PCP:  Tamsen Snider, NP  Cardiologist:  Gypsy Balsam, MD    Referring MD: Tamsen Snider, NP   Chief Complaint  Patient presents with   Shortness of Breath   svt    History of Present Illness:    Katie Rasmussen is a 50 y.o. female     with very complex past medical history which include history of cardiomyopathy with normalization, latest echocardiogram in October 2021 showing normal left ventricle ejection fraction, she does have diastolic dysfunction, advanced COPD with chronic lung issues, chronic respiratory failure, hypothyroidism, obesity, coronary artery disease with PTCA and stenting of the proximal LAD done in March 2019.  Recently in February she suffered from COVID-19 infection she ended going to the hospital required steroids.  Likely recovered reasonably well from it she said she 75% back to what she was before but she said she was very sick.  She described to have some palpitations.  She did wear monitor placed by primary care physician which showed some SVTs. She comes today to my office for follow-up overall she seems to be doing well.  What bothers her is tachycardia she said she got episode of tachyarrhythmia previously she was diagnosed with SVT.  She said it happened about twice within the last 2 weeks.  She was also find to have probably thyroiditis being corrected she gradually started feeling better.  Shortness of breath is still there.  Past Medical History:  Diagnosis Date   Acute combined systolic and diastolic CHF, NYHA class 3 (HCC)    Acute on chronic respiratory failure with hypoxia and hypercapnia (HCC) 09/14/2017   Ankle sprain 02/18/2020   Asthma exacerbation 02/18/2020   Bronchitis due to tobacco use 08/2017   Chest pain 02/18/2020   Chronic respiratory failure (HCC)    CKD (chronic kidney disease), stage III (HCC)    COPD  (chronic obstructive pulmonary disease) (HCC)    Coronary artery disease    DCM (dilated cardiomyopathy) (HCC)    Diastolic CHF (HCC)    Dyslipidemia 10/08/2017   Dyspnea on exertion 05/07/2018   Elevated troponin    Gout    Hypertension 02/18/2020   Hypothyroidism    h/o Grave's, s/p thyroidectomy & RAI in 1990s   Ischemic cardiomyopathy initial ejection fraction 35 to 40% in March 2019, normalization of ejection fraction based on echocardiogram from 2020 10/08/2017   Ejection fraction 35-40% in March 2019   Leukocytosis 02/18/2020   Obesity hypoventilation syndrome (HCC)    Orthostatic hypotension 02/18/2020   OSA (obstructive sleep apnea) 02/18/2020   Peripheral edema 02/18/2020   Pneumonia 08/2017   Postablative hypothyroidism 04/03/2019   Respiratory acidosis 02/18/2020   Sepsis (HCC) 02/18/2020   Supraventricular tachycardia (HCC) 07/08/2020   Systolic CHF with reduced left ventricular function, NYHA class 3 (HCC) 09/13/2017   Upper respiratory infection 02/18/2020   Weakness 02/18/2020    Past Surgical History:  Procedure Laterality Date   CESAREAN SECTION     CORONARY STENT INTERVENTION N/A 09/18/2017   Procedure: CORONARY STENT INTERVENTION;  Surgeon: Tonny Bollman, MD;  Location: [facility] INVASIVE CV LAB;  Service: Cardiovascular;  Laterality: N/A;  prox LAD   LEFT HEART CATH AND CORONARY ANGIOGRAPHY N/A 09/18/2017   Procedure: LEFT HEART CATH AND CORONARY ANGIOGRAPHY;  Surgeon: Tonny Bollman, MD;  Location: [facility] INVASIVE CV LAB;  Service: Cardiovascular;  Laterality: N/A;  THYROID SURGERY     s/p RAI for Grave's    Current Medications: Current Meds  Medication Sig   albuterol (PROVENTIL) (2.5 MG/3ML) 0.083% nebulizer solution Take 3 mLs (2.5 mg total) by nebulization every 6 (six) hours as needed for wheezing or shortness of breath.   allopurinol (ZYLOPRIM) 100 MG tablet Take 50 mg by mouth daily.   ALPRAZolam (XANAX) 0.25 MG tablet Take 1 tablet (0.25 mg total) by mouth at  bedtime as needed for anxiety.   aspirin EC 81 MG EC tablet Take 1 tablet (81 mg total) by mouth daily.   atorvastatin (LIPITOR) 40 MG tablet Take 1 tablet (40 mg total) by mouth daily.   clopidogrel (PLAVIX) 75 MG tablet Take 1 tablet (75 mg total) by mouth daily with breakfast.   cyanocobalamin (,VITAMIN B-12,) 1000 MCG/ML injection INJECT 1ML SUBQ ONCE MONTHLY. (Patient taking differently: Inject 100 mcg into the muscle every 30 (thirty) days.)   diltiazem (CARDIZEM CD) 120 MG 24 hr capsule Take 1 capsule (120 mg total) by mouth daily. (Patient taking differently: Take 120 mg by mouth as needed (For elevated HR).)   diltiazem (CARDIZEM) 30 MG tablet Take 1 tablet (30 mg total) by mouth every 6 (six) hours as needed (For palpitations).   fluticasone furoate-vilanterol (BREO ELLIPTA) 200-25 MCG/INH AEPB Inhale 1 puff into the lungs daily.   levothyroxine (SYNTHROID) 137 MCG tablet Take 1 tablet (137 mcg total) by mouth daily before breakfast.   metolazone (ZAROXOLYN) 2.5 MG tablet Take 1 tablet (2.5 mg total) by mouth once a week. 30 min before morning dose of lasix.   metoprolol succinate (TOPROL XL) 25 MG 24 hr tablet Take 1 tablet (25 mg total) by mouth daily.   torsemide (DEMADEX) 20 MG tablet TAKE 1 TABLET BY MOUTH TWICE DAILY. (Patient taking differently: Take 20 mg by mouth 2 (two) times daily.)   VENTOLIN HFA 108 (90 Base) MCG/ACT inhaler INHALE 2 PUFFS BY MOUTH EVERY 4 HOURS ASNEEDED FOR SHORTNESS OF BREATH. (Patient taking differently: Inhale 2 puffs into the lungs every 4 (four) hours as needed for wheezing or shortness of breath.)   Vitamin D, Ergocalciferol, (DRISDOL) 1.25 MG (50000 UNIT) CAPS capsule Take 50,000 Units by mouth once a week.     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  Tobacco Use   Smoking status: Former    Packs/day: 0.50    Years: 14.00    Pack years: 7.00    Types: Cigarettes    Quit date: 09/12/2017    Years since quitting: 3.7   Smokeless tobacco: Never  Vaping Use   Vaping Use: Never used  Substance and Sexual Activity   Alcohol use: No   Drug use: No   Sexual activity: Not on file  Other Topics Concern   Not on file  Social History Narrative   Not on file   Social Determinants of Health   Financial Resource Strain: Not on file  Food Insecurity: Not on file  Transportation Needs: Not on file  Physical Activity: Not on file  Stress: Not on file  Social Connections: Not on file  Family History: The patient's family history includes Diabetes in her father; Heart disease in her father and mother; Hypertension in her mother. ROS:   Please see the history of present illness.    All 14 point review of systems negative except as described per history of present illness  EKGs/Labs/Other Studies Reviewed:      Recent Labs: 07/08/2020: BUN 12; Creatinine, Ser 1.26; Potassium 4.2; Sodium 139; TSH 3.470 02/25/2021: Hemoglobin 13.5; Platelets 300  Recent Lipid Panel    Component Value Date/Time   CHOL 224 (H) 02/25/2021 1322   TRIG 335 (H) 02/25/2021 1322   HDL 64 02/25/2021 1322   CHOLHDL 3.5 02/25/2021 1322   LDLCALC 104 (H) 02/25/2021 1322    Physical Exam:    VS:  BP (!) 166/82 (BP Location: Left Arm, Patient Position: Sitting)   Pulse 98   Ht 5\' 1"  (1.549 m)   Wt 264 lb 6.4 oz (119.9 kg)   SpO2 94%   BMI 49.96 kg/m     Wt Readings from Last 3 Encounters:  06/01/21 264 lb 6.4 oz (119.9 kg)  04/27/21 265 lb (120.2 kg)  02/25/21 251 lb (113.9 kg)     GEN:  Well nourished, well developed in no acute distress HEENT: Normal NECK: No JVD; No carotid  bruits LYMPHATICS: No lymphadenopathy CARDIAC: RRR, no murmurs, no rubs, no gallops RESPIRATORY:  Clear to auscultation without rales, wheezing or rhonchi  ABDOMEN: Soft, non-tender, non-distended MUSCULOSKELETAL:  No edema; No deformity  SKIN: Warm and dry LOWER EXTREMITIES: no swelling NEUROLOGIC:  Alert and oriented x 3 PSYCHIATRIC:  Normal affect   ASSESSMENT:    1. Coronary artery disease involving native coronary artery of native heart without angina pectoris   2. Chronic diastolic congestive heart failure ([place])   3. Chronic bronchitis, unspecified chronic bronchitis type ([place])   4. Dyslipidemia    PLAN:    In order of problems listed above:  Coronary disease stable from that point review without any problems Supraventricular tachycardia: Uncontrolled still.  I will ask her to keep taking metoprolol however it looks like she does not take Cardizem.  I asked her to start taking Cardizem CD on the regular basis.  She also got Cardizem 30 mg to use on an as-needed basis.  If that will not be sufficient we will probably put another monitor on her. Chronic diastolic congestive heart failure: Appears to be compensated continue present management.   Dyslipidemia I did review K PN which show me LDL of 104 HDL 64.  I will wish to increase the dose of her atorvastatin however she was find to have severe hypothyroidism I would wait for her thyroid to stabilize before making some changes.    Medication Adjustments/Labs and Tests Ordered: Current medicines are reviewed at length with the patient today.  Concerns regarding medicines are outlined above.  No orders of the defined types were placed in this encounter.  Medication changes: No orders of the defined types were placed in this encounter.   Signed, Park Liter, MD, [facility] 06/01/2021 1:05 PM    [facility]

## 2021-06-07 ENCOUNTER — Other Ambulatory Visit: Payer: Self-pay

## 2021-06-07 ENCOUNTER — Ambulatory Visit (INDEPENDENT_AMBULATORY_CARE_PROVIDER_SITE_OTHER): Payer: BC Managed Care – PPO | Admitting: Endocrinology

## 2021-06-07 ENCOUNTER — Encounter: Payer: Self-pay | Admitting: Endocrinology

## 2021-06-07 VITALS — BP 138/82 | HR 79 | Ht 61.0 in

## 2021-06-07 DIAGNOSIS — E1169 Type 2 diabetes mellitus with other specified complication: Secondary | ICD-10-CM

## 2021-06-07 DIAGNOSIS — E890 Postprocedural hypothyroidism: Secondary | ICD-10-CM | POA: Diagnosis not present

## 2021-06-07 DIAGNOSIS — M1000 Idiopathic gout, unspecified site: Secondary | ICD-10-CM | POA: Diagnosis not present

## 2021-06-07 DIAGNOSIS — E669 Obesity, unspecified: Secondary | ICD-10-CM | POA: Diagnosis not present

## 2021-06-07 LAB — BASIC METABOLIC PANEL
BUN: 15 mg/dL (ref 6–23)
CO2: 27 mEq/L (ref 19–32)
Calcium: 9.5 mg/dL (ref 8.4–10.5)
Chloride: 104 mEq/L (ref 96–112)
Creatinine, Ser: 1.26 mg/dL — ABNORMAL HIGH (ref 0.40–1.20)
GFR: 49.66 mL/min — ABNORMAL LOW (ref >60.00)
Glucose, Bld: 145 mg/dL — ABNORMAL HIGH (ref 70–99)
Potassium: 4.1 mEq/L (ref 3.5–5.1)
Sodium: 140 mEq/L (ref 135–145)

## 2021-06-07 LAB — TSH: TSH: 3.31 u[IU]/mL (ref 0.35–5.50)

## 2021-06-07 LAB — MICROALBUMIN / CREATININE URINE RATIO
Creatinine,U: 201.8 mg/dL
Microalb Creat Ratio: 22.4 mg/g (ref 0.0–30.0)
Microalb, Ur: 45.2 mg/dL — ABNORMAL HIGH (ref 0.0–1.9)

## 2021-06-07 LAB — T4, FREE: Free T4: 1.04 ng/dL (ref 0.60–1.60)

## 2021-06-07 LAB — URIC ACID: Uric Acid, Serum: 9.8 mg/dL — ABNORMAL HIGH (ref 2.4–7.0)

## 2021-06-07 MED ORDER — COLCHICINE 0.6 MG PO TABS: ORAL_TABLET | ORAL | 0 refills | Status: DC

## 2021-06-07 NOTE — Progress Notes (Signed)
Patient ID: Katie Rasmussen, female   DOB: 1970-12-02, 50 y.o.   MRN: [id number]            Reason for Appointment:  Hypothyroidism, follow-up visit    History of Present Illness:   Hypothyroidism was first diagnosed in 1990  At the time of diagnosis patient had been treated with radioactive iodine for Graves' disease  History at initial visit: She has generally been followed by her primary care physician and monitored with regular thyroid levels However she is now concerned that she is having marked variability in her thyroid levels and dosage This year her dose has ranged between 100 mcg and 225 mcg  She says that when she has a low thyroid function level she has symptoms of  fatigue, decreased motivation, dry skin, weight gain, hair loss and some cold sensitivity. Otherwise if her thyroid level is too high she will have palpitations and feels jittery      About 6 months or so ago she was on 225 mcg of Synthroid but subsequently this was reduced because of high thyroid levels and she was down to 100 mcg subsequently In 7/20 her TSH was 23.7 and the dose was increased from 125 mcg up to 175 mcg  RECENT HISTORY  The patient has been treated with  brand-name Synthroid previously but now now on generic  Recent history of the thyroid levels are as follows TSH in 3/22 was 13.7 but in April was below normal twice She apparently was told to stop her levothyroxine temporarily and then only start with half of 25 mcg which was only gradually increased However her TSH went up significantly in 5/22 up to 69 but her doses have been increased only gradually up to 75 mcg until about 6 weeks ago when she was given additional 25 mcg Most recent TSH was 19.5 as of 04/12/2021  Her dose of Synthroid was increased from 100 mcg up to 137 mcg on her last visit in 04/2021  She previously had symptoms of significant fatigue and depression as well as weight gain and the symptoms are better  although her weight is about the same She had been losing hair and this is improving She has chronic cold intolerance  The patient takes the thyroid supplement at 6 AM as before very regularly without any food or coffee at that time She does not take any iron or calcium-containing supplements at the same time         Patient's weight history is as follows:  Wt Readings from Last 3 Encounters:  06/01/21 264 lb 6.4 oz (119.9 kg)  04/27/21 265 lb (120.2 kg)  02/25/21 251 lb (113.9 kg)    Thyroid function results in the chart as below  Lab Results  Component Value Date   TSH 3.470 07/08/2020   TSH 1.950 05/06/2019   TSH 0.066 (L) 05/07/2018   TSH 1.278 09/14/2017   FREET4 1.31 05/06/2019     Past Medical History:  Diagnosis Date   Acute combined systolic and diastolic CHF, NYHA class 3 (HCC)    Acute on chronic respiratory failure with hypoxia and hypercapnia (HCC) 09/14/2017   Ankle sprain 02/18/2020   Asthma exacerbation 02/18/2020   Bronchitis due to tobacco use 08/2017   Chest pain 02/18/2020   Chronic respiratory failure (HCC)    CKD (chronic kidney disease), stage III (HCC)    COPD (chronic obstructive pulmonary disease) (HCC)    Coronary artery disease    DCM (dilated cardiomyopathy) (HCC)  Diastolic CHF ([place])    Dyslipidemia 10/08/2017   Dyspnea on exertion 05/07/2018   Elevated troponin    Gout    Hypertension 02/18/2020   Hypothyroidism    h/o Grave's, s/p thyroidectomy & RAI in 1990s   Ischemic cardiomyopathy initial ejection fraction 35 to 40% in March 2019, normalization of ejection fraction based on echocardiogram from 2020 10/08/2017   Ejection fraction 35-40% in March 2019   Leukocytosis 02/18/2020   Obesity hypoventilation syndrome (HCC)    Orthostatic hypotension 02/18/2020   OSA (obstructive sleep apnea) 02/18/2020   Peripheral edema 02/18/2020   Pneumonia 08/2017   Postablative hypothyroidism 04/03/2019   Respiratory acidosis 02/18/2020   Sepsis ([place])  02/18/2020   Supraventricular tachycardia ([place]) [id number]   Systolic CHF with reduced left ventricular function, NYHA class 3 ([place]) 09/13/2017   Upper respiratory infection 02/18/2020   Weakness 02/18/2020    Past Surgical History:  Procedure Laterality Date   CESAREAN SECTION     CORONARY STENT INTERVENTION N/A 09/18/2017   Procedure: CORONARY STENT INTERVENTION;  Surgeon: Sherren Mocha, MD;  Location: [facility];  Service: Cardiovascular;  Laterality: N/A;  prox LAD   LEFT HEART CATH AND CORONARY ANGIOGRAPHY N/A 09/18/2017   Procedure: LEFT HEART CATH AND CORONARY ANGIOGRAPHY;  Surgeon: Sherren Mocha, MD;  Location: [facility];  Service: Cardiovascular;  Laterality: N/A;   THYROID SURGERY     s/p RAI for Grave's    Family History  Problem Relation Age of Onset   Heart disease Mother    Hypertension Mother    Heart disease Father    Diabetes Father     Social History:  reports that she quit smoking about 3 years ago. Her smoking use included cigarettes. She has a 7.00 pack-year smoking history. She has never used smokeless tobacco. She reports that she does not drink alcohol and does not use drugs.  Allergies:  Allergies  Allergen Reactions   Enoxaparin Sodium Other (See Comments)   Iodine Hives and Itching   Lovenox [Enoxaparin]    No Known Allergies Hives   Sulfa Antibiotics Other (See Comments)   Shellfish Allergy Hives and Itching    Allergies as of 06/07/2021       Reactions   Enoxaparin Sodium Other (See Comments)   Iodine Hives, Itching   Lovenox [enoxaparin]    No Known Allergies Hives   Sulfa Antibiotics Other (See Comments)   Shellfish Allergy Hives, Itching        Medication List        Accurate as of June 07, 2021  9:42 AM. If you have any questions, ask your nurse or doctor.          albuterol (2.5 MG/3ML) 0.083% nebulizer solution Commonly known as: PROVENTIL Take 3 mLs (2.5 mg total) by nebulization every 6 (six)  hours as needed for wheezing or shortness of breath. What changed: Another medication with the same name was changed. Make sure you understand how and when to take each.   Ventolin HFA 108 (90 Base) MCG/ACT inhaler Generic drug: albuterol INHALE 2 PUFFS BY MOUTH EVERY 4 HOURS ASNEEDED FOR SHORTNESS OF BREATH. What changed: See the new instructions.   allopurinol 100 MG tablet Commonly known as: ZYLOPRIM Take 50 mg by mouth daily.   ALPRAZolam 0.25 MG tablet Commonly known as: XANAX Take 1 tablet (0.25 mg total) by mouth at bedtime as needed for anxiety.   aspirin 81 MG EC tablet Take 1 tablet (81 mg total)  by mouth daily.   atorvastatin 40 MG tablet Commonly known as: LIPITOR Take 1 tablet (40 mg total) by mouth daily.   Breo Ellipta 200-25 MCG/ACT Aepb Generic drug: fluticasone furoate-vilanterol Inhale 1 puff into the lungs daily.   clopidogrel 75 MG tablet Commonly known as: PLAVIX Take 1 tablet (75 mg total) by mouth daily with breakfast.   cyanocobalamin 1000 MCG/ML injection Commonly known as: (VITAMIN B-12) INJECT SUBQ ONCE MONTHLY. What changed: See the new instructions.   diltiazem 120 MG 24 hr capsule Commonly known as: CARDIZEM CD Take 1 capsule (120 mg total) by mouth daily. What changed:  when to take this reasons to take this   diltiazem 30 MG tablet Commonly known as: Cardizem Take 1 tablet (30 mg total) by mouth every 6 (six) hours as needed (For palpitations).   levothyroxine 137 MCG tablet Commonly known as: Synthroid Take 1 tablet (137 mcg total) by mouth daily before breakfast.   metolazone 2.5 MG tablet Commonly known as: ZAROXOLYN Take 1 tablet (2.5 mg total) by mouth once a week. 30 min before morning dose of lasix.   metoprolol succinate 25 MG 24 hr tablet Commonly known as: Toprol XL Take 1 tablet (25 mg total) by mouth daily.   torsemide 20 MG tablet Commonly known as: DEMADEX TAKE 1 TABLET BY MOUTH TWICE DAILY. What changed:  when to take this   TYLENOL PO Take by mouth.   Vitamin D (Ergocalciferol) 1.25 MG (50000 UNIT) Caps capsule Commonly known as: DRISDOL Take 50,000 Units by mouth once a week.           Review of Systems  GOUT: She said that she has had recurrent episodes of gout in the past She again started having gout in her left toe spreading to her ankle and having severe pain and tenderness Previously she was given prednisone for treatment but she has not discussed this episode with her PCP She is taking 5 mg prednisone on her own the last couple of days without any relief Her last uric acid level was 7.6 and no recent labs available  She is supposed to be taking only 50 mg of allopurinol which she does not take regularly, she was told not to take more because of kidney toxicity      Lab Results  Component Value Date   LABURIC 9.8 (H) 06/07/2021   LABURIC 7.6 (H) 02/19/2020    HYPERCHOLESTEROLEMIA with CAD  About a week ago was increased from 20 up to 40 mg of atorvastatin  Lab Results  Component Value Date   CHOL 224 (H) 02/25/2021   Lab Results  Component Value Date   HDL 64 02/25/2021   Lab Results  Component Value Date   LDLCALC 104 (H) 02/25/2021   Lab Results  Component Value Date   TRIG 335 (H) 02/25/2021   Lab Results  Component Value Date   CHOLHDL 3.5 02/25/2021   No results found for: LDLDIRECT      Examination:    BP 138/82   Pulse 79   Ht 5\' 1"  (1.549 m)   SpO2 97%   BMI 49.96 kg/m   Moderate swelling of the left foot present with warmth and mild redness She has marked tenderness of the base of the first toe and ankle  Assessment:  HYPOTHYROIDISM, post radioactive iodine treatment  She has been requiring very variable doses of replacement with Synthroid lately Now with increasing her dose and taking 137 mcg daily she feels subjectively  better  Currently taking generic levothyroxine regularly every day   Mild diabetes fasting glucose  was 105 and she does not think blood sugars are higher  LIPIDS: Her LDL in October was 117 and needs more aggressive treatment because of history of CAD  PLAN:   She will have thyroid levels checked  She will be getting acute treatment for her gout with colchicine, 1.2 mg followed by 0.6 mg an hour later and she will take at least 1 tablet daily for at least another 2 days She can take 100 mg of allopurinol but will need follow-up uric acid  Recheck renal function, may consider adding Jardiance because of her history of CHF and cardiomyopathy  Will recommend adding Zetia to her atorvastatin to her cardiologist  Elayne Snare 06/07/2021, 9:42 AM   Addendum: Uric acid 9.8, she should at least take 100 mg of allopurinol and consider higher dose Thyroid level normal Creatinine 1.26    Note: This office note was prepared with Estate agent. Any transcriptional errors that result from this process are unintentional.

## 2021-06-07 NOTE — Patient Instructions (Signed)
Take 100mg  allourinol

## 2021-06-15 ENCOUNTER — Telehealth: Payer: Self-pay | Admitting: Endocrinology

## 2021-06-15 MED ORDER — EZETIMIBE 10 MG PO TABS: 10.0000 mg | ORAL_TABLET | Freq: Every day | ORAL | 3 refills | Status: DC

## 2021-06-15 NOTE — Telephone Encounter (Signed)
Have discussed her persistently high cholesterol with cardiologist and recommend adding ezetimibe to help atorvastatin control her cholesterol adequately.  She needs to start the prescription which she is going to be sent today along with atorvastatin

## 2021-06-21 NOTE — Telephone Encounter (Signed)
Attempted to call patient to let her know about ezetimibe medication. No answer, no voicemail left voicemail full.

## 2021-07-12 ENCOUNTER — Other Ambulatory Visit: Payer: Self-pay | Admitting: Endocrinology

## 2021-08-12 ENCOUNTER — Other Ambulatory Visit: Payer: Self-pay | Admitting: Sports Medicine

## 2021-08-12 ENCOUNTER — Ambulatory Visit (INDEPENDENT_AMBULATORY_CARE_PROVIDER_SITE_OTHER): Payer: BC Managed Care – PPO

## 2021-08-12 ENCOUNTER — Ambulatory Visit: Payer: BC Managed Care – PPO | Admitting: Sports Medicine

## 2021-08-12 ENCOUNTER — Encounter: Payer: Self-pay | Admitting: Sports Medicine

## 2021-08-12 DIAGNOSIS — M21619 Bunion of unspecified foot: Secondary | ICD-10-CM

## 2021-08-12 DIAGNOSIS — M778 Other enthesopathies, not elsewhere classified: Secondary | ICD-10-CM | POA: Diagnosis not present

## 2021-08-12 DIAGNOSIS — M2062 Acquired deformities of toe(s), unspecified, left foot: Secondary | ICD-10-CM

## 2021-08-12 DIAGNOSIS — M84378A Stress fracture, left toe(s), initial encounter for fracture: Secondary | ICD-10-CM | POA: Diagnosis not present

## 2021-08-12 DIAGNOSIS — M779 Enthesopathy, unspecified: Secondary | ICD-10-CM

## 2021-08-12 DIAGNOSIS — M79672 Pain in left foot: Secondary | ICD-10-CM

## 2021-08-12 NOTE — Progress Notes (Addendum)
Subjective: Katie Rasmussen is a 51 y.o. female patient who presents to office for evaluation of left foot pain.  States that her big toe turned black and blue last week is difficult to move it and he also had an episode where became very bright red is very sore with burning pain and numb occasionally does not recall recall any type of trauma or injury and states that the toe hurts with shoes.  Patient denies any other pedal complaints at this time.  Patient Active Problem List   Diagnosis Date Noted   Gout    Diastolic CHF ([place])    Chronic respiratory failure (HCC)    Ankle sprain 02/18/2020   Asthma exacerbation 02/18/2020   Chest pain 02/18/2020   CKD (chronic kidney disease), stage III ([place]) 02/18/2020   Hypertension 02/18/2020   Hypothyroidism 02/18/2020   Leukocytosis 02/18/2020   OSA (obstructive sleep apnea) 02/18/2020   Peripheral edema 02/18/2020   Respiratory acidosis 02/18/2020   Sepsis ([place]) 02/18/2020   Upper respiratory infection 02/18/2020   Weakness 02/18/2020   Orthostatic hypotension 02/18/2020   Postablative hypothyroidism 04/03/2019   Dyspnea on exertion 05/07/2018   Ischemic cardiomyopathy initial ejection fraction 35 to 40% in March 2019, normalization of ejection fraction based on echocardiogram from 2020 10/08/2017   Coronary artery disease drug-eluting stent implanted to proximal LAD in March 2019 in face of acute myocardial infarction 10/08/2017   Dyslipidemia 10/08/2017   DCM (dilated cardiomyopathy) (HCC)    Elevated troponin    Acute on chronic respiratory failure with hypoxia and hypercapnia (HCC) 09/14/2017   Obesity hypoventilation syndrome (HCC)    Acute combined systolic and diastolic CHF, NYHA class 3 (HCC)    COPD (chronic obstructive pulmonary disease) ([place])    Systolic CHF with reduced left ventricular function, NYHA class 3 ([place]) 09/13/2017   Pneumonia 08/10/2017   Bronchitis due to tobacco use 08/10/2017    Current Outpatient  Medications on File Prior to Visit  Medication Sig Dispense Refill   Acetaminophen (TYLENOL PO) Take by mouth.     albuterol (PROVENTIL) (2.5 MG/3ML) 0.083% nebulizer solution Take 3 mLs (2.5 mg total) by nebulization every 6 (six) hours as needed for wheezing or shortness of breath. 75 mL 12   allopurinol (ZYLOPRIM) 100 MG tablet Take 50 mg by mouth daily.     ALPRAZolam (XANAX) 0.25 MG tablet Take 1 tablet (0.25 mg total) by mouth at bedtime as needed for anxiety. 10 tablet 0   amoxicillin-clavulanate (AUGMENTIN) 875-125 MG tablet Take 1 tablet by mouth 2 (two) times daily.     aspirin EC 81 MG EC tablet Take 1 tablet (81 mg total) by mouth daily.     atorvastatin (LIPITOR) 40 MG tablet Take 1 tablet (40 mg total) by mouth daily. 90 tablet 3   clopidogrel (PLAVIX) 75 MG tablet Take 1 tablet (75 mg total) by mouth daily with breakfast. 90 tablet 3   colchicine 0.6 MG tablet Take 2 tablets now, third tablet in 1 hour and then 1 tablet daily for 2 days 10 tablet 0   cyanocobalamin (,VITAMIN B-12,) 1000 MCG/ML injection INJECT 1ML SUBQ ONCE MONTHLY. (Patient taking differently: Inject 100 mcg into the muscle every 30 (thirty) days.) 3 mL 4   diltiazem (CARDIZEM CD) 120 MG 24 hr capsule Take 1 capsule (120 mg total) by mouth daily. (Patient taking differently: Take 120 mg by mouth as needed (For elevated HR).) 90 capsule 1   diltiazem (CARDIZEM) 30 MG tablet Take 1 tablet (  30 mg total) by mouth every 6 (six) hours as needed (For palpitations). 30 tablet 3   ezetimibe (ZETIA) 10 MG tablet Take 1 tablet (10 mg total) by mouth daily. 30 tablet 3   fluticasone furoate-vilanterol (BREO ELLIPTA) 200-25 MCG/INH AEPB Inhale 1 puff into the lungs daily. 1 each 11   levothyroxine (SYNTHROID) 137 MCG tablet TAKE 1 TABLET BY MOUTH EVERY MORNING ON AN EMPTY STOMACH. 30 tablet 2   metolazone (ZAROXOLYN) 2.5 MG tablet Take 1 tablet (2.5 mg total) by mouth once a week. 30 min before morning dose of lasix. 30 tablet 0    metoprolol succinate (TOPROL XL) 25 MG 24 hr tablet Take 1 tablet (25 mg total) by mouth daily. 90 tablet 3   predniSONE (DELTASONE) 10 MG tablet Take by mouth.     torsemide (DEMADEX) 20 MG tablet TAKE 1 TABLET BY MOUTH TWICE DAILY. (Patient taking differently: Take 20 mg by mouth 2 (two) times daily.) 180 tablet 2   VENTOLIN HFA 108 (90 Base) MCG/ACT inhaler INHALE 2 PUFFS BY MOUTH EVERY 4 HOURS ASNEEDED FOR SHORTNESS OF BREATH. (Patient taking differently: Inhale 2 puffs into the lungs every 4 (four) hours as needed for wheezing or shortness of breath.) 18 g 0   Vitamin D, Ergocalciferol, (DRISDOL) 1.25 MG (50000 UNIT) CAPS capsule Take 50,000 Units by mouth once a week.     No current facility-administered medications on file prior to visit.    Allergies  Allergen Reactions   Enoxaparin Sodium Other (See Comments)   Iodine Hives and Itching   Lovenox [Enoxaparin]    No Known Allergies Hives   Sulfa Antibiotics Other (See Comments)   Shellfish Allergy Hives and Itching    Objective:  General: Alert and oriented x3 in no acute distress  Dermatology: Minimal erythema, mild focal swelling at the interphalangeal joint of the left great toe, no warmth no open lesions bilateral lower extremities, no webspace macerations, no ecchymosis bilateral, all nails x 10 are well manicured.  Vascular: Dorsalis Pedis and Posterior Tibial pedal pulses 1/4, Capillary Fill Time 3 seconds,(+) pedal hair growth bilateral,Temperature gradient increased over the right midfoot.  Neurology: Gross sensation intact via light touch bilateral.  Patient is hypersensitive to everything even light touch.  Musculoskeletal: There is tenderness with palpation at the proximal interphalangeal joint of the left hallux.  There is no pain with palpation to bunion deformity on the left.  There is limited range of motion and weakness noted at 4 out of 5 on the left.       Assessment and Plan: Problem List Items Addressed  This Visit   None Visit Diagnoses     Acquired deformity of left toe    -  Primary   Relevant Orders   DG Foot Complete Left   Stress fracture of phalanx of toe of left foot, initial encounter       Bunion       Capsulitis       Left foot pain            -Complete examination performed -X-rays reviewed dislocated bunion with questionable stress fracture at the interphalangeal joint of the hallux -Discussed treatement options for possible stress fracture versus capsulitis with previous history of gout however at this time this does not appear to be an acute gout flareup -Applied Coban splinting to the toe and advised patient to do the same daily -Dispensed surgical shoe for patient to use as directed -Advised patient her that she  always may have pain even if she eventually decided to have Bunion surgery because of the gout. -Return to office in 3 to 4 weeks for repeat x-ray for further evaluation for suspected stress fracture if pain is still present.  Landis Martins, DPM

## 2021-08-22 ENCOUNTER — Other Ambulatory Visit: Payer: Self-pay | Admitting: Cardiology

## 2021-09-05 ENCOUNTER — Ambulatory Visit: Payer: BC Managed Care – PPO | Admitting: Endocrinology

## 2021-09-06 ENCOUNTER — Encounter: Payer: Self-pay | Admitting: Cardiology

## 2021-09-06 ENCOUNTER — Ambulatory Visit: Payer: BC Managed Care – PPO | Admitting: Sports Medicine

## 2021-09-06 ENCOUNTER — Other Ambulatory Visit: Payer: Self-pay

## 2021-09-06 ENCOUNTER — Ambulatory Visit: Payer: BC Managed Care – PPO | Admitting: Cardiology

## 2021-09-06 VITALS — BP 148/100 | HR 77 | Ht 61.0 in | Wt 268.8 lb

## 2021-09-06 DIAGNOSIS — J42 Unspecified chronic bronchitis: Secondary | ICD-10-CM | POA: Diagnosis not present

## 2021-09-06 DIAGNOSIS — E890 Postprocedural hypothyroidism: Secondary | ICD-10-CM | POA: Diagnosis not present

## 2021-09-06 DIAGNOSIS — E785 Hyperlipidemia, unspecified: Secondary | ICD-10-CM

## 2021-09-06 DIAGNOSIS — J9611 Chronic respiratory failure with hypoxia: Secondary | ICD-10-CM

## 2021-09-06 DIAGNOSIS — I255 Ischemic cardiomyopathy: Secondary | ICD-10-CM

## 2021-09-06 NOTE — Patient Instructions (Signed)
Medication Instructions:  °Your physician recommends that you continue on your current medications as directed. Please refer to the Current Medication list given to you today. ° °*If you need a refill on your cardiac medications before your next appointment, please call your pharmacy* ° ° °Lab Work: °Your physician recommends that you return for lab work in: Labs today: BMP, Pro BNP °If you have labs (blood work) drawn today and your tests are completely normal, you will receive your results only by: °MyChart Message (if you have MyChart) OR °A paper copy in the mail °If you have any lab test that is abnormal or we need to change your treatment, we will call you to review the results. ° ° °Testing/Procedures: °None ° ° °Follow-Up: °At [facility], you and your health needs are our priority.  As part of our continuing mission to provide you with exceptional heart care, we have created designated Provider Care Teams.  These Care Teams include your primary Cardiologist (physician) and Advanced Practice Providers (APPs -  Physician Assistants and Nurse Practitioners) who all work together to provide you with the care you need, when you need it. ° °We recommend signing up for the patient portal called "MyChart".  Sign up information is provided on this After Visit Summary.  MyChart is used to connect with patients for Virtual Visits (Telemedicine).  Patients are able to view lab/test results, encounter notes, upcoming appointments, etc.  Non-urgent messages can be sent to your provider as well.   °To learn more about what you can do with MyChart, go to [email or web address].   ° °Your next appointment:   °3 month(s) ° °The format for your next appointment:   °In Person ° °Provider:   °Robert Krasowski, MD  ° ° °Other Instructions °None ° °

## 2021-09-06 NOTE — Progress Notes (Signed)
Cardiology Office Note:    Date:  09/06/2021   ID:  Katie Rasmussen, DOB 31-Jan-1971, MRN [id number]  PCP:  Darrol Jump, NP  Cardiologist:  Jenne Campus, MD    Referring MD: Clancy Gourd, NP   Chief Complaint  Patient presents with   Leg Swelling   Shortness of Breath         History of Present Illness:    Katie Rasmussen is a 51 y.o. female with past medical history significant for cardiomyopathy with normalization with latest echocardiogram 2021 showing preserved ejection fraction, COPD, chronic lung disease, chronic respiratory failure, hypothyroidism, obesity, coronary artery disease with PTCA and stenting of the proximal LAD in March 2019.  Last time I saw her it was after COVID-19 that she had difficulty shaking of.  Things are getting better.  She also got supraventricular tachycardia however lately she was find to have thyroid problem has been corrected and his symptomatology dramatically improved in terms of palpitations.  She does not take any Cardizem and she is feeling great she had very rare episode of short lasting palpitations that she can abort by taking or holding deep breath.  She described to have however swelling of lower extremities.  Tragedy strike her oldest daughter who is 101 years old who has majo majo disease is critically sick and she is actually at hospice care at home.  Of course she is very shaken by that  Past Medical History:  Diagnosis Date   Acute combined systolic and diastolic CHF, NYHA class 3 (HCC)    Acute on chronic respiratory failure with hypoxia and hypercapnia ([place]) 09/14/2017   Ankle sprain 02/18/2020   Asthma exacerbation 02/18/2020   Bronchitis due to tobacco use 08/2017   Chest pain 02/18/2020   Chronic respiratory failure (HCC)    CKD (chronic kidney disease), stage III (HCC)    COPD (chronic obstructive pulmonary disease) (HCC)    Coronary artery disease    DCM (dilated cardiomyopathy) (HCC)    Diastolic  CHF ([place])    Dyslipidemia 10/08/2017   Dyspnea on exertion 05/07/2018   Elevated troponin    Gout    Hypertension 02/18/2020   Hypothyroidism    h/o Grave's, s/p thyroidectomy & RAI in 1990s   Ischemic cardiomyopathy initial ejection fraction 35 to 40% in March 2019, normalization of ejection fraction based on echocardiogram from 2020 10/08/2017   Ejection fraction 35-40% in March 2019   Leukocytosis 02/18/2020   Obesity hypoventilation syndrome (HCC)    Orthostatic hypotension 02/18/2020   OSA (obstructive sleep apnea) 02/18/2020   Peripheral edema 02/18/2020   Pneumonia 08/2017   Postablative hypothyroidism 04/03/2019   Respiratory acidosis 02/18/2020   Sepsis ([place]) 02/18/2020   Supraventricular tachycardia ([place]) [id number]   Systolic CHF with reduced left ventricular function, NYHA class 3 ([place]) 09/13/2017   Upper respiratory infection 02/18/2020   Weakness 02/18/2020    Past Surgical History:  Procedure Laterality Date   CESAREAN SECTION     CORONARY STENT INTERVENTION N/A 09/18/2017   Procedure: CORONARY STENT INTERVENTION;  Surgeon: Sherren Mocha, MD;  Location: [facility];  Service: Cardiovascular;  Laterality: N/A;  prox LAD   LEFT HEART CATH AND CORONARY ANGIOGRAPHY N/A 09/18/2017   Procedure: LEFT HEART CATH AND CORONARY ANGIOGRAPHY;  Surgeon: Sherren Mocha, MD;  Location: [facility];  Service: Cardiovascular;  Laterality: N/A;   THYROID SURGERY     s/p RAI for Grave's    Current Medications: Current Meds  Medication  Sig   Acetaminophen (TYLENOL PO) Take by mouth.   albuterol (PROVENTIL) (2.5 MG/3ML) 0.083% nebulizer solution Take 3 mLs (2.5 mg total) by nebulization every 6 (six) hours as needed for wheezing or shortness of breath.   allopurinol (ZYLOPRIM) 100 MG tablet Take 50 mg by mouth daily.   ALPRAZolam (XANAX) 0.25 MG tablet Take 1 tablet (0.25 mg total) by mouth at bedtime as needed for anxiety.   amoxicillin-clavulanate (AUGMENTIN) 875-125 MG tablet  Take 1 tablet by mouth 2 (two) times daily.   aspirin EC 81 MG EC tablet Take 1 tablet (81 mg total) by mouth daily.   atorvastatin (LIPITOR) 40 MG tablet Take 1 tablet (40 mg total) by mouth daily.   clopidogrel (PLAVIX) 75 MG tablet TAKE 1 TABLET BY MOUTH ONCE DAILY WITH BREAKFAST. (Patient taking differently: Take 75 mg by mouth daily.)   colchicine 0.6 MG tablet Take 2 tablets now, third tablet in 1 hour and then 1 tablet daily for 2 days (Patient taking differently: Take 0.6 mg by mouth daily. Take 2 tablets now, third tablet in 1 hour and then 1 tablet daily for 2 days)   cyanocobalamin (,VITAMIN B-12,) 1000 MCG/ML injection INJECT 1ML SUBQ ONCE MONTHLY. (Patient taking differently: Inject 100 mcg into the muscle every 30 (thirty) days.)   diltiazem (CARDIZEM CD) 120 MG 24 hr capsule Take 1 capsule (120 mg total) by mouth daily. (Patient taking differently: Take 120 mg by mouth as needed (For elevated HR).)   diltiazem (CARDIZEM) 30 MG tablet Take 1 tablet (30 mg total) by mouth every 6 (six) hours as needed (For palpitations).   ezetimibe (ZETIA) 10 MG tablet Take 1 tablet (10 mg total) by mouth daily.   fluticasone furoate-vilanterol (BREO ELLIPTA) 200-25 MCG/INH AEPB Inhale 1 puff into the lungs daily.   levothyroxine (SYNTHROID) 137 MCG tablet TAKE 1 TABLET BY MOUTH EVERY MORNING ON AN EMPTY STOMACH. (Patient taking differently: Take 137 mcg by mouth daily before breakfast.)   metolazone (ZAROXOLYN) 2.5 MG tablet Take 1 tablet (2.5 mg total) by mouth once a week. 30 min before morning dose of lasix.   metoprolol succinate (TOPROL XL) 25 MG 24 hr tablet Take 1 tablet (25 mg total) by mouth daily.   predniSONE (DELTASONE) 10 MG tablet Take 10 mg by mouth daily with breakfast.   torsemide (DEMADEX) 20 MG tablet TAKE 1 TABLET BY MOUTH TWICE DAILY. (Patient taking differently: Take 20 mg by mouth 2 (two) times daily.)   VENTOLIN HFA 108 (90 Base) MCG/ACT inhaler INHALE 2 PUFFS BY MOUTH EVERY 4  HOURS ASNEEDED FOR SHORTNESS OF BREATH. (Patient taking differently: Inhale 2 puffs into the lungs every 4 (four) hours as needed for wheezing or shortness of breath.)   Vitamin D, Ergocalciferol, (DRISDOL) 1.25 MG (50000 UNIT) CAPS capsule Take 50,000 Units by mouth once a week.     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  Tobacco Use   Smoking status: Former    Packs/day: 0.50    Years: 14.00    Pack years: 7.00    Types: Cigarettes    Quit date: 09/12/2017  Years since quitting: 3.9   Smokeless tobacco: Never  Vaping Use   Vaping Use: Never used  Substance and Sexual Activity   Alcohol use: No   Drug use: No   Sexual activity: Not on file  Other Topics Concern   Not on file  Social History Narrative   Not on file   Social Determinants of Health   Financial Resource Strain: Not on file  Food Insecurity: Not on file  Transportation Needs: Not on file  Physical Activity: Not on file  Stress: Not on file  Social Connections: Not on file     Family History: The patient's family history includes Diabetes in her father; Heart disease in her father and mother; Hypertension in her mother. ROS:   Please see the history of present illness.    All 14 point review of systems negative except as described per history of present illness  EKGs/Labs/Other Studies Reviewed:      Recent Labs: 02/25/2021: Hemoglobin 13.5; Platelets 300 06/07/2021: BUN 15; Creatinine, Ser 1.26; Potassium 4.1; Sodium 140; TSH 3.31  Recent Lipid Panel    Component Value Date/Time   CHOL 224 (H) 02/25/2021 1322   TRIG 335 (H) 02/25/2021 1322   HDL  64 02/25/2021 1322   CHOLHDL 3.5 02/25/2021 1322   LDLCALC 104 (H) 02/25/2021 1322    Physical Exam:    VS:  BP (!) 148/100 (BP Location: Left Arm, Patient Position: Sitting)    Pulse 77    Ht 5\' 1"  (1.549 m)    Wt 268 lb 12.8 oz (121.9 kg)    SpO2 96%    BMI 50.79 kg/m     Wt Readings from Last 3 Encounters:  09/06/21 268 lb 12.8 oz (121.9 kg)  06/01/21 264 lb 6.4 oz (119.9 kg)  04/27/21 265 lb (120.2 kg)     GEN:  Well nourished, well developed in no acute distress HEENT: Normal NECK: No JVD; No carotid bruits LYMPHATICS: No lymphadenopathy CARDIAC: RRR, no murmurs, no rubs, no gallops RESPIRATORY:  Clear to auscultation without rales, wheezing or rhonchi  ABDOMEN: Soft, non-tender, non-distended MUSCULOSKELETAL:  No edema; No deformity  SKIN: Warm and dry LOWER EXTREMITIES: no swelling NEUROLOGIC:  Alert and oriented x 3 PSYCHIATRIC:  Normal affect   ASSESSMENT:    1. Ischemic cardiomyopathy initial ejection fraction 35 to 40% in March 2019, normalization of ejection fraction based on echocardiogram from 2020   2. Chronic bronchitis, unspecified chronic bronchitis type ([place])   3. Chronic respiratory failure with hypoxia (HCC)   4. Postablative hypothyroidism   5. Dyslipidemia    PLAN:    In order of problems listed above:  Ischemic cardiomyopathy with normalization last echocardiogram she does have a little more shortness of breath she does have swelling of lower extremities we will check proBNP Chem-7 to assess the situation. Chronic bronchitis will be managed by pulmonary.  I did review her CT which shows some mild fibrotic changes there was some resolution of peribronchial thickening related to bronchitis.  Overall actually looks quite better.  She does have coronary artery disease on the CT. Hypothyroidism has been taking her excellently by internal medicine team Dyslipidemia, I did review K PN which show me LDL of 104 HDL 64.  She is on Lipitor 40 I will try to add  Zetia to her medical regimen.   Medication Adjustments/Labs and Tests Ordered: Current medicines are reviewed at length with the patient today.  Concerns regarding medicines are outlined above.  No orders  of the defined types were placed in this encounter.  Medication changes: No orders of the defined types were placed in this encounter.   Signed, Park Liter, MD, [facility] 09/06/2021 2:15 PM    [facility]

## 2021-09-07 LAB — BASIC METABOLIC PANEL
BUN/Creatinine Ratio: 13 (ref 9–23)
BUN: 15 mg/dL (ref 6–24)
CO2: 22 mmol/L (ref 20–29)
Calcium: 9.3 mg/dL (ref 8.7–10.2)
Chloride: 99 mmol/L (ref 96–106)
Creatinine, Ser: 1.16 mg/dL — ABNORMAL HIGH (ref 0.57–1.00)
Glucose: 123 mg/dL — ABNORMAL HIGH (ref 70–99)
Potassium: 4.3 mmol/L (ref 3.5–5.2)
Sodium: 138 mmol/L (ref 134–144)
eGFR: 57 mL/min/{1.73_m2} — ABNORMAL LOW (ref >59)

## 2021-09-07 LAB — PRO B NATRIURETIC PEPTIDE: NT-Pro BNP: 405 pg/mL — ABNORMAL HIGH (ref 0–249)

## 2021-10-13 ENCOUNTER — Telehealth: Payer: Self-pay | Admitting: Cardiology

## 2021-10-13 NOTE — Telephone Encounter (Signed)
Pt states that she has increased shortness of breath and gaind 4 pounds in 2 days. ?Please advise. ?

## 2021-10-13 NOTE — Telephone Encounter (Signed)
Pt experiencing "Chest pain/ shortness of breath" ? ?Please advise ? ?Thank you ?

## 2021-10-14 NOTE — Telephone Encounter (Signed)
Recommendations reviewed with pt as per Dr. Krasowski's note.  Pt verbalized understanding and had no additional questions.  

## 2021-10-25 ENCOUNTER — Ambulatory Visit: Payer: BC Managed Care – PPO | Admitting: Sports Medicine

## 2021-10-25 ENCOUNTER — Encounter: Payer: Self-pay | Admitting: Sports Medicine

## 2021-10-25 ENCOUNTER — Ambulatory Visit (INDEPENDENT_AMBULATORY_CARE_PROVIDER_SITE_OTHER): Payer: BC Managed Care – PPO

## 2021-10-25 DIAGNOSIS — M7750 Other enthesopathy of unspecified foot: Secondary | ICD-10-CM

## 2021-10-25 DIAGNOSIS — M779 Enthesopathy, unspecified: Secondary | ICD-10-CM

## 2021-10-25 DIAGNOSIS — M778 Other enthesopathies, not elsewhere classified: Secondary | ICD-10-CM | POA: Diagnosis not present

## 2021-10-25 DIAGNOSIS — M109 Gout, unspecified: Secondary | ICD-10-CM | POA: Diagnosis not present

## 2021-10-25 DIAGNOSIS — M7751 Other enthesopathy of right foot: Secondary | ICD-10-CM

## 2021-10-25 DIAGNOSIS — M79671 Pain in right foot: Secondary | ICD-10-CM

## 2021-10-25 MED ORDER — COLCHICINE 0.6 MG PO TABS: 0.6000 mg | ORAL_TABLET | Freq: Two times a day (BID) | ORAL | 0 refills | Status: DC

## 2021-10-25 MED ORDER — TRIAMCINOLONE ACETONIDE 10 MG/ML IJ SUSP: 10.0000 mg | Freq: Once | INTRAMUSCULAR | Status: AC

## 2021-10-25 MED ORDER — ALLOPURINOL 100 MG PO TABS: 100.0000 mg | ORAL_TABLET | Freq: Every day | ORAL | 6 refills | Status: DC

## 2021-10-25 NOTE — Progress Notes (Signed)
Subjective: ?Katie Rasmussen is a 51 y.o. female patient who presents to office for evaluation of right foot pain reports that the pain and swelling has gotten significantly worse over the last 13 days states that she cannot walk because of the pain.  Patient reports that she had red meat on yesterday but states that she also has been under a lot of stress because her daughter passed away.  Patient denies any other pedal complaints injury or trauma at this time and states that she has finished all of her allopurinol and has a hard time getting her primary doctor to keep getting it refilled.  No other pedal complaints at this time. ?Patient Active Problem List  ? Diagnosis Date Noted  ? Gout   ? Diastolic CHF ([place])   ? Chronic respiratory failure ([place])   ? Ankle sprain 02/18/2020  ? Asthma exacerbation 02/18/2020  ? Chest pain 02/18/2020  ? CKD (chronic kidney disease), stage III ([place]) 02/18/2020  ? Hypertension 02/18/2020  ? Hypothyroidism 02/18/2020  ? Leukocytosis 02/18/2020  ? OSA (obstructive sleep apnea) 02/18/2020  ? Peripheral edema 02/18/2020  ? Respiratory acidosis 02/18/2020  ? Sepsis ([place]) 02/18/2020  ? Upper respiratory infection 02/18/2020  ? Weakness 02/18/2020  ? Orthostatic hypotension 02/18/2020  ? Postablative hypothyroidism 04/03/2019  ? Dyspnea on exertion 05/07/2018  ? Ischemic cardiomyopathy initial ejection fraction 35 to 40% in March 2019, normalization of ejection fraction based on echocardiogram from 2020 10/08/2017  ? Coronary artery disease drug-eluting stent implanted to proximal LAD in March 2019 in face of acute myocardial infarction 10/08/2017  ? Dyslipidemia 10/08/2017  ? DCM (dilated cardiomyopathy) ([place])   ? Elevated troponin   ? Acute on chronic respiratory failure with hypoxia and hypercapnia ([place]) 09/14/2017  ? Obesity hypoventilation syndrome ([place])   ? Acute combined systolic and diastolic CHF, NYHA class 3 ([place])   ? COPD (chronic obstructive pulmonary disease) ([place])    ? Systolic CHF with reduced left ventricular function, NYHA class 3 ([place]) 09/13/2017  ? Pneumonia 08/10/2017  ? Bronchitis due to tobacco use 08/10/2017  ? ? ?Current Outpatient Medications on File Prior to Visit  ?Medication Sig Dispense Refill  ? Acetaminophen (TYLENOL PO) Take by mouth.    ? albuterol (PROVENTIL) (2.5 MG/3ML) 0.083% nebulizer solution Take 3 mLs (2.5 mg total) by nebulization every 6 (six) hours as needed for wheezing or shortness of breath. 75 mL 12  ? ALPRAZolam (XANAX) 0.25 MG tablet Take 1 tablet (0.25 mg total) by mouth at bedtime as needed for anxiety. 10 tablet 0  ? amoxicillin-clavulanate (AUGMENTIN) 875-125 MG tablet Take 1 tablet by mouth 2 (two) times daily.    ? aspirin EC 81 MG EC tablet Take 1 tablet (81 mg total) by mouth daily.    ? atorvastatin (LIPITOR) 40 MG tablet Take 1 tablet (40 mg total) by mouth daily. 90 tablet 3  ? clopidogrel (PLAVIX) 75 MG tablet TAKE 1 TABLET BY MOUTH ONCE DAILY WITH BREAKFAST. (Patient taking differently: Take 75 mg by mouth daily.) 90 tablet 1  ? cyanocobalamin (,VITAMIN B-12,) 1000 MCG/ML injection INJECT 1ML SUBQ ONCE MONTHLY. (Patient taking differently: Inject 100 mcg into the muscle every 30 (thirty) days.) 3 mL 4  ? diltiazem (CARDIZEM CD) 120 MG 24 hr capsule Take 1 capsule (120 mg total) by mouth daily. (Patient taking differently: Take 120 mg by mouth as needed (For elevated HR).) 90 capsule 1  ? diltiazem (CARDIZEM) 30 MG tablet Take 1 tablet (30 mg total) by  mouth every 6 (six) hours as needed (For palpitations). 30 tablet 3  ? ezetimibe (ZETIA) 10 MG tablet Take 1 tablet (10 mg total) by mouth daily. 30 tablet 3  ? fluticasone furoate-vilanterol (BREO ELLIPTA) 200-25 MCG/INH AEPB Inhale 1 puff into the lungs daily. 1 each 11  ? levothyroxine (SYNTHROID) 137 MCG tablet TAKE 1 TABLET BY MOUTH EVERY MORNING ON AN EMPTY STOMACH. (Patient taking differently: Take 137 mcg by mouth daily before breakfast.) 30 tablet 2  ? metolazone  (ZAROXOLYN) 2.5 MG tablet Take 1 tablet (2.5 mg total) by mouth once a week. 30 min before morning dose of lasix. 30 tablet 0  ? metoprolol succinate (TOPROL XL) 25 MG 24 hr tablet Take 1 tablet (25 mg total) by mouth daily. 90 tablet 3  ? predniSONE (DELTASONE) 10 MG tablet Take 10 mg by mouth daily with breakfast.    ? SPIRIVA RESPIMAT 1.25 MCG/ACT AERS SMARTSIG:2 Puff(s) Via Inhaler Daily    ? torsemide (DEMADEX) 20 MG tablet TAKE 1 TABLET BY MOUTH TWICE DAILY. (Patient taking differently: Take 20 mg by mouth 2 (two) times daily.) 180 tablet 2  ? VENTOLIN HFA 108 (90 Base) MCG/ACT inhaler INHALE 2 PUFFS BY MOUTH EVERY 4 HOURS ASNEEDED FOR SHORTNESS OF BREATH. (Patient taking differently: Inhale 2 puffs into the lungs every 4 (four) hours as needed for wheezing or shortness of breath.) 18 g 0  ? Vitamin D, Ergocalciferol, (DRISDOL) 1.25 MG (50000 UNIT) CAPS capsule Take 50,000 Units by mouth once a week.    ? ?No current facility-administered medications on file prior to visit.  ? ? ?Allergies  ?Allergen Reactions  ? Enoxaparin Sodium Other (See Comments)  ? Iodine Hives and Itching  ? Lovenox [Enoxaparin]   ? No Known Allergies Hives  ? Sulfa Antibiotics Other (See Comments)  ? Shellfish Allergy Hives and Itching  ? ? ?Objective:  ?General: Alert and oriented x3 in no acute distress ? ?Dermatology: Focal Swelling, warmth, redness present on the Right midfoot likely consistent with her history of gout, no open lesions bilateral lower extremities, no webspace macerations, no ecchymosis bilateral, all nails x 10 are well manicured. ? ?Vascular: Dorsalis Pedis and Posterior Tibial pedal pulses 1/4, Capillary Fill Time 3 seconds,(+) pedal hair growth bilateral,Temperature gradient increased over the right midfoot. ? ?Neurology: Gross sensation intact via light touch bilateral.  Patient is hypersensitive to everything even light touch. ? ?Musculoskeletal: There is tenderness with palpation at dorsal midfoot on the  right,No pain with calf compression bilateral. Strength within normal limits in all groups bilateral.  ? ?      ?Assessment and Plan: ?Problem List Items Addressed This Visit   ? ?  ? Other  ? Gout  ? ?Other Visit Diagnoses   ? ? Tendonitis of ankle    -  Primary  ? Relevant Orders  ? DG Ankle Complete Right  ? Capsulitis      ? Relevant Medications  ? triamcinolone acetonide (KENALOG) 10 MG/ML injection 10 mg (Start on 10/25/2021 12:30 PM)  ? Tendonitis of ankle or foot      ? Relevant Orders  ? DG Foot 2 Views Right  ? Right foot pain      ? ?  ?  ? ?-Complete examination performed ?-X-rays show no acute osseous findings at the area of soft tissue swelling however there is diffuse arthritis at the ankle and midfoot with heel spur noncontributory ?-Discussed treatement options for gouty arthritis and gout education provided ?- After  oral consent, injected right dorsal midfoot with 1cc lidocaine and marcaine plain mixed with 0.25cc Kenalog-10 and Dexmethasone phosphate without complication; post injection care explained. ?-Dispensed Surgigrip compression sleeve to help with pain and swelling of the right foot ?-Advised patient to continue with her long-term gout medication refilled allopurinol and colchicine ?-Advised patient to call if symptoms are not improved within 1 week ?-Patient to return to office 3 weeks or sooner if condition worsens. ? ?Landis Martins, DPM ? ?

## 2021-10-31 ENCOUNTER — Other Ambulatory Visit: Payer: Self-pay | Admitting: Sports Medicine

## 2021-10-31 DIAGNOSIS — M778 Other enthesopathies, not elsewhere classified: Secondary | ICD-10-CM

## 2021-10-31 DIAGNOSIS — M7750 Other enthesopathy of unspecified foot: Secondary | ICD-10-CM

## 2021-11-02 ENCOUNTER — Telehealth: Payer: Self-pay | Admitting: *Deleted

## 2021-11-02 NOTE — Telephone Encounter (Signed)
Called and spoke with the patient yesterday and relayed the message per Dr Stover. Delainie Chavana 

## 2021-11-02 NOTE — Telephone Encounter (Signed)
-----   Message from Asencion Islam, [place] sent at 11/01/2021  6:38 AM EDT ----- ?Rx was received via fax and was signed this morning and faxed back to her pharmacy ?Thanks ?Dr. Kathie Rhodes ?----- Message ----- ?From: Lanney Gins, PMAC ?Sent: 10/31/2021   3:11 PM EDT ?To: Asencion Islam, DPM ? ?Patient called and left a message stating that she would like a prescription of prednisone for the relief of her foot. Misty Stanley ? ? ?

## 2021-11-15 ENCOUNTER — Ambulatory Visit: Payer: BC Managed Care – PPO | Admitting: Sports Medicine

## 2021-11-18 ENCOUNTER — Other Ambulatory Visit: Payer: Self-pay | Admitting: Endocrinology

## 2021-12-07 ENCOUNTER — Encounter: Payer: Self-pay | Admitting: Cardiology

## 2021-12-07 ENCOUNTER — Ambulatory Visit (INDEPENDENT_AMBULATORY_CARE_PROVIDER_SITE_OTHER): Payer: BC Managed Care – PPO | Admitting: Cardiology

## 2021-12-07 VITALS — BP 170/98 | HR 93 | Ht 61.0 in | Wt 270.0 lb

## 2021-12-07 DIAGNOSIS — I471 Supraventricular tachycardia: Secondary | ICD-10-CM

## 2021-12-07 DIAGNOSIS — R0609 Other forms of dyspnea: Secondary | ICD-10-CM

## 2021-12-07 DIAGNOSIS — I255 Ischemic cardiomyopathy: Secondary | ICD-10-CM

## 2021-12-07 DIAGNOSIS — R079 Chest pain, unspecified: Secondary | ICD-10-CM

## 2021-12-07 DIAGNOSIS — I10 Essential (primary) hypertension: Secondary | ICD-10-CM | POA: Diagnosis not present

## 2021-12-07 DIAGNOSIS — J42 Unspecified chronic bronchitis: Secondary | ICD-10-CM

## 2021-12-07 MED ORDER — DILTIAZEM HCL ER COATED BEADS 180 MG PO CP24: 180.0000 mg | ORAL_CAPSULE | Freq: Every day | ORAL | 0 refills | Status: DC

## 2021-12-07 NOTE — Patient Instructions (Signed)
Medication Instructions:  Your physician has recommended you make the following change in your medication: Increase Cardizem CD 180mg  1 tablet daily by mouth    Lab Work: CMP, ProBNP today If you have labs (blood work) drawn today and your tests are completely normal, you will receive your results only by: MyChart Message (if you have MyChart) OR A paper copy in the mail If you have any lab test that is abnormal or we need to change your treatment, we will call you to review the results.   Testing/Procedures: None Ordered   Follow-Up: At [facility], you and your health needs are our priority.  As part of our continuing mission to provide you with exceptional heart care, we have created designated Provider Care Teams.  These Care Teams include your primary Cardiologist (physician) and Advanced Practice Providers (APPs -  Physician Assistants and Nurse Practitioners) who all work together to provide you with the care you need, when you need it.  We recommend signing up for the patient portal called "MyChart".  Sign up information is provided on this After Visit Summary.  MyChart is used to connect with patients for Virtual Visits (Telemedicine).  Patients are able to view lab/test results, encounter notes, upcoming appointments, etc.  Non-urgent messages can be sent to your provider as well.   To learn more about what you can do with MyChart, go to [email or web address].    Your next appointment:   2 month(s)  The format for your next appointment:   In Person  Provider:   Gypsy Balsam, MD    Other Instructions NA

## 2021-12-07 NOTE — Progress Notes (Signed)
Cardiology Office Note:    Date:  12/07/2021   ID:  Katie Rasmussen, DOB [birth year], MRN [id number]  PCP:  Pcp, No  Cardiologist:  Jenne Campus, MD    Referring MD: Darrol Jump, NP   Chief Complaint  Patient presents with   Follow-up    History of Present Illness:    Katie Rasmussen is a 51 y.o. female with past medical history significant for cardiomyopathy with normalization, COPD, chronic lung disease, chronic respiratory failure, hypothyroidism, obesity, coronary artery disease status post PTCA and stenting of the proximal LAD in March 2019.  She also had history of supraventricular tachycardia.  She comes today to my office for follow-up.  She is crying she lost her daughter last time I saw her her daughter was in hospice.  She described to have frequent episode of palpitations she feels her heart speeding up, she takes some extra Cardizem 30 mg daily with some relief today when she came to our office she thought she had SVT however EKG did not confirm that she did have some sinus tachycardia with some APCs.  Shortness of breath seems to be relatively stable  Past Medical History:  Diagnosis Date   Acute combined systolic and diastolic CHF, NYHA class 3 (HCC)    Acute on chronic respiratory failure with hypoxia and hypercapnia (HCC) 09/14/2017   Ankle sprain 02/18/2020   Asthma exacerbation 02/18/2020   Bronchitis due to tobacco use 08/2017   Chest pain 02/18/2020   Chronic respiratory failure (HCC)    CKD (chronic kidney disease), stage III (HCC)    COPD (chronic obstructive pulmonary disease) (HCC)    Coronary artery disease    DCM (dilated cardiomyopathy) ([place])    Diastolic CHF ([place])    Dyslipidemia 10/08/2017   Dyspnea on exertion 05/07/2018   Elevated troponin    Gout    Hypertension 02/18/2020   Hypothyroidism    h/o Grave's, s/p thyroidectomy & RAI in 1990s   Ischemic cardiomyopathy initial ejection fraction 35 to 40% in March 2019,  normalization of ejection fraction based on echocardiogram from 2020 10/08/2017   Ejection fraction 35-40% in March 2019   Leukocytosis 02/18/2020   Obesity hypoventilation syndrome (HCC)    Orthostatic hypotension 02/18/2020   OSA (obstructive sleep apnea) 02/18/2020   Peripheral edema 02/18/2020   Pneumonia 08/2017   Postablative hypothyroidism 04/03/2019   Respiratory acidosis 02/18/2020   Sepsis ([place]) 02/18/2020   Supraventricular tachycardia ([place]) [id number]   Systolic CHF with reduced left ventricular function, NYHA class 3 ([place]) 09/13/2017   Upper respiratory infection 02/18/2020   Weakness 02/18/2020    Past Surgical History:  Procedure Laterality Date   CESAREAN SECTION     CORONARY STENT INTERVENTION N/A 09/18/2017   Procedure: CORONARY STENT INTERVENTION;  Surgeon: Sherren Mocha, MD;  Location: [facility];  Service: Cardiovascular;  Laterality: N/A;  prox LAD   LEFT HEART CATH AND CORONARY ANGIOGRAPHY N/A 09/18/2017   Procedure: LEFT HEART CATH AND CORONARY ANGIOGRAPHY;  Surgeon: Sherren Mocha, MD;  Location: [facility];  Service: Cardiovascular;  Laterality: N/A;   THYROID SURGERY     s/p RAI for Grave's    Current Medications: Current Meds  Medication Sig   acetaminophen (TYLENOL) 500 MG tablet Take 500 mg by mouth every 6 (six) hours as needed for mild pain or moderate pain.   albuterol (PROVENTIL) (2.5 MG/3ML) 0.083% nebulizer solution Take 3 mLs (2.5 mg total) by nebulization every 6 (six) hours as needed for  wheezing or shortness of breath.   allopurinol (ZYLOPRIM) 100 MG tablet Take 1 tablet (100 mg total) by mouth daily.   ALPRAZolam (XANAX) 0.25 MG tablet Take 1 tablet (0.25 mg total) by mouth at bedtime as needed for anxiety.   amoxicillin-clavulanate (AUGMENTIN) 875-125 MG tablet Take 1 tablet by mouth 2 (two) times daily.   aspirin EC 81 MG EC tablet Take 1 tablet (81 mg total) by mouth daily.   atorvastatin (LIPITOR) 40 MG tablet Take 1 tablet (40  mg total) by mouth daily.   clopidogrel (PLAVIX) 75 MG tablet TAKE 1 TABLET BY MOUTH ONCE DAILY WITH BREAKFAST.   colchicine 0.6 MG tablet Take 1 tablet (0.6 mg total) by mouth 2 (two) times daily.   cyanocobalamin (,VITAMIN B-12,) 1000 MCG/ML injection INJECT SUBQ ONCE MONTHLY.   diltiazem (CARDIZEM CD) 180 MG 24 hr capsule Take 1 capsule (180 mg total) by mouth daily.   diltiazem (CARDIZEM) 30 MG tablet Take 1 tablet (30 mg total) by mouth every 6 (six) hours as needed (For palpitations).   ezetimibe (ZETIA) 10 MG tablet Take 1 tablet (10 mg total) by mouth daily.   fluticasone furoate-vilanterol (BREO ELLIPTA) 200-25 MCG/INH AEPB Inhale 1 puff into the lungs daily.   levothyroxine (SYNTHROID) 137 MCG tablet TAKE 1 TABLET BY MOUTH EVERY MORNING ON AN EMPTY STOMACH.   metolazone (ZAROXOLYN) 2.5 MG tablet Take 1 tablet (2.5 mg total) by mouth once a week. 30 min before morning dose of lasix.   metoprolol succinate (TOPROL XL) 25 MG 24 hr tablet Take 1 tablet (25 mg total) by mouth daily.   predniSONE (DELTASONE) 10 MG tablet Take 10 mg by mouth daily with breakfast.   SPIRIVA RESPIMAT 1.25 MCG/ACT AERS SMARTSIG:2 Puff(s) Via Inhaler Daily   torsemide (DEMADEX) 20 MG tablet TAKE 1 TABLET BY MOUTH TWICE DAILY.   VENTOLIN HFA 108 (90 Base) MCG/ACT inhaler INHALE 2 PUFFS BY MOUTH EVERY 4 HOURS ASNEEDED FOR SHORTNESS OF BREATH.   Vitamin D, Ergocalciferol, (DRISDOL) 1.25 MG (50000 UNIT) CAPS capsule Take 50,000 Units by mouth once a week.   [DISCONTINUED] diltiazem (CARDIZEM CD) 120 MG 24 hr capsule Take 1 capsule (120 mg total) by mouth daily. (Patient taking differently: Take 120 mg by mouth as needed (For elevated HR).)   Current Facility-Administered Medications for the 12/07/21 encounter (Office Visit) with Georgeanna Lea, MD  Medication   triamcinolone acetonide (KENALOG) 10 MG/ML injection 10 mg     Allergies:   Enoxaparin sodium, Iodine, Lovenox [enoxaparin], No known allergies,  Sulfa antibiotics, and Shellfish allergy   Social History   Socioeconomic History   Marital status: Married    Spouse name: Not on file   Number of children: 3   Years of education: Not on file   Highest education level: Not on file  Occupational History   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  Tobacco Use   Smoking status: Former    Packs/day: 0.50    Years: 14.00    Pack years: 7.00    Types: Cigarettes    Quit date: 09/12/2017    Years since quitting: 4.2   Smokeless tobacco: Never  Vaping Use   Vaping Use: Never used  Substance and Sexual Activity   Alcohol use: No   Drug use: No   Sexual activity: Not on file  Other Topics Concern   Not on file  Social History Narrative   Not on file   Social Determinants of Health  Financial Resource Strain: Not on file  Food Insecurity: Not on file  Transportation Needs: Not on file  Physical Activity: Not on file  Stress: Not on file  Social Connections: Not on file     Family History: The patient's family history includes Diabetes in her father; Heart disease in her father and mother; Hypertension in her mother. ROS:   Please see the history of present illness.    All 14 point review of systems negative except as described per history of present illness  EKGs/Labs/Other Studies Reviewed:      Recent Labs: 02/25/2021: Hemoglobin 13.5; Platelets 300 06/07/2021: TSH 3.31 09/06/2021: BUN 15; Creatinine, Ser 1.16; NT-Pro BNP 405; Potassium 4.3; Sodium 138  Recent Lipid Panel    Component Value Date/Time   CHOL 224 (H) 02/25/2021 1322   TRIG 335 (H) 02/25/2021 1322   HDL 64 02/25/2021 1322   CHOLHDL 3.5 02/25/2021 1322   LDLCALC 104 (H) 02/25/2021 1322    Physical Exam:    VS:  BP (!) 170/98 (BP Location: Left Arm, Patient Position: Sitting, Cuff Size: Large)   Pulse 93   Ht 5\' 1"  (1.549 m)   Wt 270 lb (122.5 kg)   SpO2 98%   BMI 51.02 kg/m     Wt Readings from Last 3 Encounters:  12/07/21 270  lb (122.5 kg)  09/06/21 268 lb 12.8 oz (121.9 kg)  06/01/21 264 lb 6.4 oz (119.9 kg)     GEN:  Well nourished, well developed in no acute distress HEENT: Normal NECK: No JVD; No carotid bruits LYMPHATICS: No lymphadenopathy CARDIAC: RRR, no murmurs, no rubs, no gallops RESPIRATORY:  Clear to auscultation without rales, wheezing or rhonchi  ABDOMEN: Soft, non-tender, non-distended MUSCULOSKELETAL:  No edema; No deformity  SKIN: Warm and dry LOWER EXTREMITIES: no swelling NEUROLOGIC:  Alert and oriented x 3 PSYCHIATRIC:  Normal affect   ASSESSMENT:    1. Chest pain of uncertain etiology   2. Dyspnea on exertion   3. Ischemic cardiomyopathy initial ejection fraction 35 to 40% in March 2019, normalization of ejection fraction based on echocardiogram from 2020   4. Primary hypertension   5. Supraventricular tachycardia ([place])   6. Chronic bronchitis, unspecified chronic bronchitis type ([place])    PLAN:    In order of problems listed above:  Supraventricular tachycardia.  She tells me that she is upset with her primary care physician, because, anytime she goes there is her EKG is done she is in's SVT they want admitted to the hospital.  I will increase dose of Cardizem CD from 1 20-1 80.  She still got 30 mg of Cardizem to take on as-needed basis which she will do. History of cardiomyopathy we will make arrangements for echocardiogram to be done Essential hypertension blood pressure somewhat elevated today but she is very emotional she is crying my office.   Medication Adjustments/Labs and Tests Ordered: Current medicines are reviewed at length with the patient today.  Concerns regarding medicines are outlined above.  Orders Placed This Encounter  Procedures   Comprehensive metabolic panel   Pro b natriuretic peptide (BNP)   EKG 12-Lead   Medication changes:  Meds ordered this encounter  Medications   diltiazem (CARDIZEM CD) 180 MG 24 hr capsule    Sig: Take 1 capsule (180 mg  total) by mouth daily.    Dispense:  90 capsule    Refill:  0    Signed, Park Liter, MD, [facility] 12/07/2021 12:14 PM  [facility]

## 2021-12-08 LAB — COMPREHENSIVE METABOLIC PANEL
ALT: 21 IU/L (ref 0–32)
AST: 23 IU/L (ref 0–40)
Albumin/Globulin Ratio: 1.4 (ref 1.2–2.2)
Albumin: 4.4 g/dL (ref 3.8–4.9)
Alkaline Phosphatase: 115 IU/L (ref 44–121)
BUN/Creatinine Ratio: 14 (ref 9–23)
BUN: 18 mg/dL (ref 6–24)
Bilirubin Total: 0.3 mg/dL (ref 0.0–1.2)
CO2: 22 mmol/L (ref 20–29)
Calcium: 9.9 mg/dL (ref 8.7–10.2)
Chloride: 98 mmol/L (ref 96–106)
Creatinine, Ser: 1.3 mg/dL — ABNORMAL HIGH (ref 0.57–1.00)
Globulin, Total: 3.1 g/dL (ref 1.5–4.5)
Glucose: 154 mg/dL — ABNORMAL HIGH (ref 70–99)
Potassium: 3.7 mmol/L (ref 3.5–5.2)
Sodium: 140 mmol/L (ref 134–144)
Total Protein: 7.5 g/dL (ref 6.0–8.5)
eGFR: 50 mL/min/{1.73_m2} — ABNORMAL LOW (ref >59)

## 2021-12-08 LAB — PRO B NATRIURETIC PEPTIDE: NT-Pro BNP: 315 pg/mL — ABNORMAL HIGH (ref 0–249)

## 2021-12-09 ENCOUNTER — Telehealth: Payer: Self-pay | Admitting: Cardiology

## 2021-12-09 ENCOUNTER — Telehealth: Payer: Self-pay

## 2021-12-09 MED ORDER — METOLAZONE 2.5 MG PO TABS: 2.5000 mg | ORAL_TABLET | ORAL | 3 refills | Status: DC

## 2021-12-09 MED ORDER — VENTOLIN HFA 108 (90 BASE) MCG/ACT IN AERS: INHALATION_SPRAY | RESPIRATORY_TRACT | 0 refills | Status: AC

## 2021-12-09 NOTE — Telephone Encounter (Signed)
Dr. Bing MatterKrasowski aware that pt is out of her Albuterol inhaler and ok to refill times 1. Pt aware.

## 2021-12-09 NOTE — Telephone Encounter (Signed)
LVM and sent message in My Chart as per Dr. Vanetta Shawl note. Encouraged pt to call if she has any questions or concerns.

## 2021-12-09 NOTE — Telephone Encounter (Signed)
Spoke with patient about refill on the Abuterol nebulizer solution and informed patient that we do not prescribe nor refill this type of medication and she would need to contact her PCP for those refills.  While she was on the phone she had questions about her lab results. She informed me she has not been taking the Zaroxolyn 2.5 once a week for about 2 years now.  I told her that when she was seen on 12/07/21 it was not removed from the med list.  Informed the patient to start taking the Zaroxolyn 2.5 once a week and have blood work rechecked in 2 weeks per Dr. Vanetta Shawl recommendations.  I sent in prescription to [facility] where the patient requested.  She informed me she will start taking it and return for Chem 7.

## 2021-12-09 NOTE — Telephone Encounter (Signed)
*  STAT* If patient is at the pharmacy, call can be transferred to refill team.   1. Which medications need to be refilled? (please list name of each medication and dose if known)   albuterol (PROVENTIL) (2.5 MG/3ML) 0.083% nebulizer solution   2. Which pharmacy/location (including street and city if local pharmacy) is medication to be sent to?  [facility] - [place], [place] - [address]. Do they need a 30 day or 90 day supply?    Pt is completely out of medication. Please advise

## 2021-12-19 ENCOUNTER — Encounter: Payer: Self-pay | Admitting: *Deleted

## 2021-12-19 DIAGNOSIS — Z006 Encounter for examination for normal comparison and control in clinical research program: Secondary | ICD-10-CM

## 2021-12-19 NOTE — Research (Signed)
[facility]. Encouraged her to call me back.

## 2022-01-13 ENCOUNTER — Ambulatory Visit: Payer: BC Managed Care – PPO | Admitting: Podiatrist

## 2022-01-24 ENCOUNTER — Encounter: Payer: Self-pay | Admitting: *Deleted

## 2022-01-24 DIAGNOSIS — Z006 Encounter for examination for normal comparison and control in clinical research program: Secondary | ICD-10-CM

## 2022-01-24 NOTE — Research (Signed)
Message left for Ms [facility] about [employer] research. Encouraged her to call if she would like more information.

## 2022-02-09 ENCOUNTER — Ambulatory Visit: Payer: BC Managed Care – PPO | Admitting: Cardiology

## 2022-02-25 ENCOUNTER — Other Ambulatory Visit: Payer: Self-pay | Admitting: Cardiology

## 2022-02-28 ENCOUNTER — Other Ambulatory Visit: Payer: Self-pay | Admitting: Cardiology

## 2022-02-28 DIAGNOSIS — J9611 Chronic respiratory failure with hypoxia: Secondary | ICD-10-CM

## 2022-02-28 DIAGNOSIS — I2510 Atherosclerotic heart disease of native coronary artery without angina pectoris: Secondary | ICD-10-CM

## 2022-02-28 DIAGNOSIS — I10 Essential (primary) hypertension: Secondary | ICD-10-CM

## 2022-02-28 DIAGNOSIS — E785 Hyperlipidemia, unspecified: Secondary | ICD-10-CM

## 2022-02-28 DIAGNOSIS — I255 Ischemic cardiomyopathy: Secondary | ICD-10-CM

## 2022-03-16 ENCOUNTER — Other Ambulatory Visit: Payer: Self-pay | Admitting: Cardiology

## 2022-03-16 DIAGNOSIS — E785 Hyperlipidemia, unspecified: Secondary | ICD-10-CM

## 2022-03-16 DIAGNOSIS — I2510 Atherosclerotic heart disease of native coronary artery without angina pectoris: Secondary | ICD-10-CM

## 2022-03-16 DIAGNOSIS — J9611 Chronic respiratory failure with hypoxia: Secondary | ICD-10-CM

## 2022-03-16 DIAGNOSIS — I255 Ischemic cardiomyopathy: Secondary | ICD-10-CM

## 2022-03-16 DIAGNOSIS — I10 Essential (primary) hypertension: Secondary | ICD-10-CM

## 2022-03-16 NOTE — Telephone Encounter (Signed)
Rx refill sent to pharmacy. 

## 2022-03-23 ENCOUNTER — Other Ambulatory Visit: Payer: Self-pay

## 2022-03-23 ENCOUNTER — Encounter: Payer: Self-pay | Admitting: Cardiology

## 2022-03-23 ENCOUNTER — Other Ambulatory Visit (HOSPITAL_COMMUNITY): Payer: Self-pay

## 2022-03-23 ENCOUNTER — Ambulatory Visit: Payer: BC Managed Care – PPO | Attending: Cardiology | Admitting: Cardiology

## 2022-03-23 VITALS — BP 140/80 | HR 140 | Resp 99 | Ht 61.0 in | Wt 269.0 lb

## 2022-03-23 DIAGNOSIS — R0609 Other forms of dyspnea: Secondary | ICD-10-CM | POA: Diagnosis not present

## 2022-03-23 DIAGNOSIS — I255 Ischemic cardiomyopathy: Secondary | ICD-10-CM

## 2022-03-23 DIAGNOSIS — I5032 Chronic diastolic (congestive) heart failure: Secondary | ICD-10-CM

## 2022-03-23 DIAGNOSIS — I10 Essential (primary) hypertension: Secondary | ICD-10-CM

## 2022-03-23 DIAGNOSIS — E662 Morbid (severe) obesity with alveolar hypoventilation: Secondary | ICD-10-CM

## 2022-03-23 NOTE — Patient Instructions (Addendum)
Medication Instructions:  Your physician recommends that you continue on your current medications as directed. Please refer to the Current Medication list given to you today.  *If you need a refill on your cardiac medications before your next appointment, please call your pharmacy*   Lab Work: None Ordered If you have labs (blood work) drawn today and your tests are completely normal, you will receive your results only by: MyChart Message (if you have MyChart) OR A paper copy in the mail If you have any lab test that is abnormal or we need to change your treatment, we will call you to review the results.   Testing/Procedures: Your physician has requested that you have an echocardiogram. Echocardiography is a painless test that uses sound waves to create images of your heart. It provides your doctor with information about the size and shape of your heart and how well your heart's chambers and valves are working. This procedure takes approximately one hour. There are no restrictions for this procedure.    Follow-Up: At [facility], you and your health needs are our priority.  As part of our continuing mission to provide you with exceptional heart care, we have created designated Provider Care Teams.  These Care Teams include your primary Cardiologist (physician) and Advanced Practice Providers (APPs -  Physician Assistants and Nurse Practitioners) who all work together to provide you with the care you need, when you need it.  We recommend signing up for the patient portal called "MyChart".  Sign up information is provided on this After Visit Summary.  MyChart is used to connect with patients for Virtual Visits (Telemedicine).  Patients are able to view lab/test results, encounter notes, upcoming appointments, etc.  Non-urgent messages can be sent to your provider as well.   To learn more about what you can do with MyChart, go to [email or web address].    Your next appointment:   5  month(s)  The format for your next appointment:   In Person  Provider:   Robert Krasowski, MD    Other Instructions NA  

## 2022-03-23 NOTE — Progress Notes (Unsigned)
Cardiology Office Note:    Date:  03/23/2022   ID:  [name], DOB 17-Jun-1971, MRN [id number]  PCP:  Pcp, No  Cardiologist:  Gypsy Balsam, MD    Referring MD: No ref. provider found   Chief Complaint  Patient presents with   Follow-up  Tired weak and fatigue some shortness of breath present  History of Present Illness:    Katie Rasmussen is a 51 y.o. female with past medical history significant for cardiomyopathy with normalization, COPD, chronic lung disease, chronic respiratory failure, hypothyroidism, obesity, coronary disease, status post PTCA and stenting proximal LAD March 2019.  Also history of supraventricular tachycardia. She is in my office today for follow-up.  Overall she complain being weak tired fatigued and short of breath also notices heart rate increasing quickly with any effort.  Denies have any chest pain tightness squeezing pressure burning chest.  Past Medical History:  Diagnosis Date   Acute combined systolic and diastolic CHF, NYHA class 3 (HCC)    Acute on chronic respiratory failure with hypoxia and hypercapnia (HCC) 09/14/2017   Ankle sprain 02/18/2020   Asthma exacerbation 02/18/2020   Bronchitis due to tobacco use 08/2017   Chest pain 02/18/2020   Chronic respiratory failure (HCC)    CKD (chronic kidney disease), stage III (HCC)    COPD (chronic obstructive pulmonary disease) (HCC)    Coronary artery disease    DCM (dilated cardiomyopathy) (HCC)    Diastolic CHF (HCC)    Dyslipidemia 10/08/2017   Dyspnea on exertion 05/07/2018   Elevated troponin    Gout    Hypertension 02/18/2020   Hypothyroidism    h/o Grave's, s/p thyroidectomy & RAI in 1990s   Ischemic cardiomyopathy initial ejection fraction 35 to 40% in March 2019, normalization of ejection fraction based on echocardiogram from 2020 10/08/2017   Ejection fraction 35-40% in March 2019   Leukocytosis 02/18/2020   Obesity hypoventilation syndrome (HCC)    Orthostatic  hypotension 02/18/2020   OSA (obstructive sleep apnea) 02/18/2020   Peripheral edema 02/18/2020   Pneumonia 08/2017   Postablative hypothyroidism 04/03/2019   Respiratory acidosis 02/18/2020   Sepsis (HCC) 02/18/2020   Supraventricular tachycardia (HCC) 07/08/2020   Systolic CHF with reduced left ventricular function, NYHA class 3 (HCC) 09/13/2017   Upper respiratory infection 02/18/2020   Weakness 02/18/2020    Past Surgical History:  Procedure Laterality Date   CESAREAN SECTION     CORONARY STENT INTERVENTION N/A 09/18/2017   Procedure: CORONARY STENT INTERVENTION;  Surgeon: Tonny Bollman, MD;  Location: [facility];  Service: Cardiovascular;  Laterality: N/A;  prox LAD   LEFT HEART CATH AND CORONARY ANGIOGRAPHY N/A 09/18/2017   Procedure: LEFT HEART CATH AND CORONARY ANGIOGRAPHY;  Surgeon: Tonny Bollman, MD;  Location: [facility]IVE [facility];  Service: Cardiovascular;  Laterality: N/A;   THYROID SURGERY     s/p RAI for Grave's    Current Medications: Current Meds  Medication Sig   acetaminophen (TYLENOL) 500 MG tablet Take 500 mg by mouth every 6 (six) hours as needed for mild pain or moderate pain.   albuterol (PROVENTIL) (2.5 MG/3ML) 0.083% nebulizer solution Take 3 mLs (2.5 mg total) by nebulization every 6 (six) hours as needed for wheezing or shortness of breath.   allopurinol (ZYLOPRIM) 100 MG tablet Take 1 tablet (100 mg total) by mouth daily.   ALPRAZolam (XANAX) 0.25 MG tablet Take 1 tablet (0.25 mg total) by mouth at bedtime as needed for anxiety.   amoxicillin-clavulanate (AUGMENTIN)  875-125 MG tablet Take 1 tablet by mouth 2 (two) times daily.   aspirin EC 81 MG EC tablet Take 1 tablet (81 mg total) by mouth daily.   atorvastatin (LIPITOR) 40 MG tablet Take 1 tablet (40 mg total) by mouth daily.   clopidogrel (PLAVIX) 75 MG tablet TAKE 1 TABLET BY MOUTH ONCE DAILY WITH BREAKFAST. (Patient taking differently: Take 75 mg by mouth daily.)   colchicine 0.6 MG tablet Take 1  tablet (0.6 mg total) by mouth 2 (two) times daily.   cyanocobalamin (,VITAMIN B-12,) 1000 MCG/ML injection INJECT SUBQ ONCE MONTHLY. (Patient taking differently: Inject 1,000 mcg into the skin every 30 (thirty) days.)   diltiazem (CARDIZEM CD) 180 MG 24 hr capsule TAKE ONE CAPSULE BY MOUTH EVERY DAY   diltiazem (CARDIZEM) 30 MG tablet Take 1 tablet (30 mg total) by mouth every 6 (six) hours as needed (Palpitations).   ezetimibe (ZETIA) 10 MG tablet Take 1 tablet (10 mg total) by mouth daily.   fluticasone furoate-vilanterol (BREO ELLIPTA) 200-25 MCG/INH AEPB Inhale 1 puff into the lungs daily.   levothyroxine (SYNTHROID) 137 MCG tablet TAKE 1 TABLET BY MOUTH EVERY MORNING ON AN EMPTY STOMACH. (Patient taking differently: Take 137 mcg by mouth daily before breakfast.)   metolazone (ZAROXOLYN) 2.5 MG tablet Take 1 tablet (2.5 mg total) by mouth once a week. 30 min before morning dose of lasix.   metoprolol succinate (TOPROL-XL) 25 MG 24 hr tablet TAKE 1 TABLET BY MOUTH ONCE DAILY.   predniSONE (DELTASONE) 10 MG tablet Take 10 mg by mouth daily with breakfast.   SPIRIVA RESPIMAT 1.25 MCG/ACT AERS Inhale 1 each into the lungs daily.   VENTOLIN HFA 108 (90 Base) MCG/ACT inhaler INHALE 2 PUFFS BY MOUTH EVERY 4 HOURS ASNEEDED FOR SHORTNESS OF BREATH. (Patient taking differently: 2 puffs every 4 (four) hours as needed for wheezing or shortness of breath. INHALE 2 PUFFS BY MOUTH EVERY 4 HOURS ASNEEDED FOR SHORTNESS OF BREATH.)   Vitamin D, Ergocalciferol, (DRISDOL) 1.25 MG (50000 UNIT) CAPS capsule Take 50,000 Units by mouth once a week.   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  Tobacco Use   Smoking status: Former    Packs/day: 0.50    Years: 14.00    Total pack years: 7.00    Types: Cigarettes    Quit date: 09/12/2017    Years since quitting: 4.5   Smokeless tobacco: Never  Vaping Use   Vaping Use: Never used  Substance and Sexual Activity   Alcohol use: No   Drug use: No   Sexual activity: Not on file  Other Topics Concern   Not on file  Social History Narrative   Not on file   Social Determinants of Health   Financial Resource Strain: Not on file  Food Insecurity: Not on file  Transportation Needs: Not on file  Physical Activity: Not on file  Stress: Stress Concern Present (09/14/2017)  [employer] - Occupational Stress Questionnaire    Feeling of Stress : Very much  Social Connections: Not on file     Family History: The patient's family history includes Diabetes in her father; Heart disease in her father and mother; Hypertension in her mother. ROS:   Please see the history of present illness.    All 14 point review of systems negative except as described per history of present illness  EKGs/Labs/Other Studies Reviewed:      Recent Labs: 06/07/2021: TSH 3.31 12/07/2021: ALT 21; BUN 18; Creatinine, Ser 1.30; NT-Pro BNP 315; Potassium 3.7; Sodium 140  Recent Lipid Panel    Component Value Date/Time   CHOL 224 (H) 02/25/2021 1322   TRIG 335 (H) 02/25/2021 1322   HDL 64 02/25/2021 1322   CHOLHDL 3.5 02/25/2021 1322   LDLCALC 104 (H) 02/25/2021 1322    Physical Exam:    VS:  BP (!) 140/80 (BP Location: Right Arm, Patient Position: Sitting)   Pulse (!) 140   Resp (!) 99   Ht 5\' 1"  (1.549 m)   Wt 269 lb (122 kg)   BMI 50.83 kg/m     Wt Readings from Last 3 Encounters:  03/23/22 269  lb (122 kg)  12/07/21 270 lb (122.5 kg)  09/06/21 268 lb 12.8 oz (121.9 kg)     GEN:  Well nourished, well developed in no acute distress HEENT: Normal NECK: No JVD; No carotid bruits LYMPHATICS: No lymphadenopathy CARDIAC: RRR, no murmurs, no rubs, no gallops RESPIRATORY:  Clear to auscultation without rales, wheezing or rhonchi  ABDOMEN: Soft, non-tender, non-distended MUSCULOSKELETAL:  No edema; No deformity  SKIN: Warm and dry LOWER EXTREMITIES: no swelling NEUROLOGIC:  Alert and oriented x 3 PSYCHIATRIC:  Normal affect   ASSESSMENT:    1. Ischemic cardiomyopathy initial ejection fraction 35 to 40% in March 2019, normalization of ejection fraction based on echocardiogram from 2020   2. Chronic diastolic congestive heart failure ([place])   3. Primary hypertension   4. Dyspnea on exertion   5. Obesity hypoventilation syndrome (HCC)    PLAN:    In order of problems listed above:  Ischemic cardiomyopathy.  Her last echocardiogram showed normal left ventricle ejection fraction.  Now she complain of having increased fatigue and tiredness, we will repeat echocardiogram to check left ventricle ejection fraction Chronic diastolic heart failure: Looks compensated on the physical exam Essential hypertension,: Blood pressure well controlled continue present management. Supraventricular tachycardia.  On appropriate medication does describe to have some palpitation but I am not convinced this is supraventricular tachycardia. Dyslipidemia I did review K PN I have cholesterol LDL at 104 and HDL 64 this is not sufficiently controlled but that data is from 2022.  I will call primary care physician to get more updated fasting lipid profile.   Medication Adjustments/Labs and Tests Ordered: Current medicines are reviewed at length with the patient today.  Concerns regarding medicines are outlined above.  No orders of the defined types were placed in this encounter.  Medication changes: No orders  of the defined types were placed in this encounter.   Signed, Park Liter, MD, [facility] 03/23/2022 11:21 AM    [place]

## 2022-04-05 ENCOUNTER — Ambulatory Visit: Payer: BC Managed Care – PPO | Attending: Cardiology

## 2022-04-05 DIAGNOSIS — R0609 Other forms of dyspnea: Secondary | ICD-10-CM

## 2022-04-05 LAB — ECHOCARDIOGRAM COMPLETE
AR max vel: 2.19 cm2
AV Area VTI: 1.7 cm2
AV Area mean vel: 1.92 cm2
AV Mean grad: 7 mmHg
AV Peak grad: 12.8 mmHg
Ao pk vel: 1.79 m/s
Area-P 1/2: 4.6 cm2
S' Lateral: 3.3 cm

## 2022-04-11 ENCOUNTER — Telehealth: Payer: Self-pay

## 2022-04-11 NOTE — Telephone Encounter (Signed)
Left My Chart message regarding normal Echo per Dr. Wendy Poet note. Routed to PCP.

## 2022-04-19 ENCOUNTER — Telehealth: Payer: Self-pay | Admitting: Cardiology

## 2022-04-19 NOTE — Telephone Encounter (Signed)
Advised to go to the ED. Pt verbalized understanding and had no additional questions.

## 2022-04-19 NOTE — Telephone Encounter (Signed)
Pt c/o swelling: STAT is pt has developed SOB within 24 hours  How much weight have you gained and in what time span? 4 lbs since 6 A.M. this morning   If swelling, where is the swelling located? Below knees and in feet.   Are you currently taking a fluid pill? Yes   Are you currently SOB? Yes, but this is normal for patient she states   Do you have a log of your daily weights (if so, list)?   Have you gained 3 pounds in a day or 5 pounds in a week? Yes   Have you traveled recently? No    Pt states she hasn't urinated but two times today and states she is in pain with swelling and scared. Requesting call back as soon as possible.

## 2022-05-19 ENCOUNTER — Other Ambulatory Visit: Payer: Self-pay | Admitting: Cardiology

## 2022-05-31 ENCOUNTER — Encounter: Payer: Self-pay | Admitting: Cardiology

## 2022-06-12 ENCOUNTER — Other Ambulatory Visit: Payer: Self-pay | Admitting: Neurosurgery

## 2022-06-12 DIAGNOSIS — M4317 Spondylolisthesis, lumbosacral region: Secondary | ICD-10-CM

## 2022-06-14 DIAGNOSIS — E66813 Obesity, class 3: Secondary | ICD-10-CM

## 2022-06-14 HISTORY — DX: Obesity, class 3: E66.813

## 2022-07-07 ENCOUNTER — Inpatient Hospital Stay: Admission: RE | Admit: 2022-07-07 | Payer: BC Managed Care – PPO | Source: Ambulatory Visit

## 2022-07-20 ENCOUNTER — Other Ambulatory Visit: Payer: Self-pay | Admitting: Neurosurgery

## 2022-07-20 DIAGNOSIS — M4317 Spondylolisthesis, lumbosacral region: Secondary | ICD-10-CM

## 2022-08-19 ENCOUNTER — Inpatient Hospital Stay: Admission: RE | Admit: 2022-08-19 | Payer: BC Managed Care – PPO | Source: Ambulatory Visit

## 2022-08-23 ENCOUNTER — Ambulatory Visit: Payer: BC Managed Care – PPO | Attending: Cardiology | Admitting: Cardiology

## 2022-08-23 VITALS — BP 136/86 | HR 88 | Ht 61.0 in | Wt 272.0 lb

## 2022-08-23 DIAGNOSIS — I5032 Chronic diastolic (congestive) heart failure: Secondary | ICD-10-CM

## 2022-08-23 DIAGNOSIS — I2510 Atherosclerotic heart disease of native coronary artery without angina pectoris: Secondary | ICD-10-CM | POA: Diagnosis not present

## 2022-08-23 DIAGNOSIS — R0609 Other forms of dyspnea: Secondary | ICD-10-CM

## 2022-08-23 DIAGNOSIS — G4733 Obstructive sleep apnea (adult) (pediatric): Secondary | ICD-10-CM | POA: Diagnosis not present

## 2022-08-23 DIAGNOSIS — J42 Unspecified chronic bronchitis: Secondary | ICD-10-CM | POA: Diagnosis not present

## 2022-08-23 MED ORDER — METOPROLOL TARTRATE 25 MG PO TABS: 12.5000 mg | ORAL_TABLET | Freq: Three times a day (TID) | ORAL | 3 refills | Status: DC | PRN

## 2022-08-23 NOTE — Addendum Note (Signed)
Addended by: Jacobo Forest D on: 08/23/2022 02:05 PM   Modules accepted: Orders

## 2022-08-23 NOTE — Progress Notes (Signed)
Cardiology Office Note:    Date:  08/23/2022   ID:  Katie Rasmussen, DOB 11-10-1970, MRN [id number]  PCP:  Martin Majestic, FNP  Cardiologist:  Jenne Campus, MD    Referring MD: Park Liter, MD   Chief Complaint  Patient presents with   Follow-up    History of Present Illness:    Katie Rasmussen is a 52 y.o. female  . female with past medical history significant for cardiomyopathy with normalization, COPD, chronic lung disease, chronic respiratory failure, hypothyroidism, obesity, coronary disease, status post PTCA and stenting proximal LAD March 2019.  Also history of supraventricular tachycardia.  She comes today to months for follow-up.  Overall she is doing good cardiac wise however still complain of some palpitations.  She said in send she tried to walk she will get palpitations and also sometimes wakes up in the middle of the night with palpitations.  She takes Cardizem short acting which seems to be helping.  Also when she got palpitation she described to have some pain in the chest.  Past Medical History:  Diagnosis Date   Acute combined systolic and diastolic CHF, NYHA class 3 (HCC)    Acute on chronic respiratory failure with hypoxia and hypercapnia ([place]) 09/14/2017   Ankle sprain 02/18/2020   Asthma exacerbation 02/18/2020   Bronchitis due to tobacco use 08/2017   Chest pain 02/18/2020   Chronic respiratory failure (HCC)    CKD (chronic kidney disease), stage III (HCC)    COPD (chronic obstructive pulmonary disease) ([place])    Coronary artery disease    DCM (dilated cardiomyopathy) ([place])    Diastolic CHF ([place])    Dyslipidemia 10/08/2017   Dyspnea on exertion 05/07/2018   Elevated troponin    Gout    Hypertension 02/18/2020   Hypothyroidism    h/o Grave's, s/p thyroidectomy & RAI in 1990s   Ischemic cardiomyopathy initial ejection fraction 35 to 40% in March 2019, normalization of ejection fraction based on echocardiogram from 2020  10/08/2017   Ejection fraction 35-40% in March 2019   Leukocytosis 02/18/2020   Obesity hypoventilation syndrome (HCC)    Orthostatic hypotension 02/18/2020   OSA (obstructive sleep apnea) 02/18/2020   Peripheral edema 02/18/2020   Pneumonia 08/2017   Postablative hypothyroidism 04/03/2019   Respiratory acidosis 02/18/2020   Sepsis ([place]) 02/18/2020   Supraventricular tachycardia ([place]) A999333   Systolic CHF with reduced left ventricular function, NYHA class 3 ([place]) 09/13/2017   Upper respiratory infection 02/18/2020   Weakness 02/18/2020    Past Surgical History:  Procedure Laterality Date   CESAREAN SECTION     CORONARY STENT INTERVENTION N/A 09/18/2017   Procedure: CORONARY STENT INTERVENTION;  Surgeon: Sherren Mocha, MD;  Location: [facility];  Service: Cardiovascular;  Laterality: N/A;  prox LAD   LEFT HEART CATH AND CORONARY ANGIOGRAPHY N/A 09/18/2017   Procedure: LEFT HEART CATH AND CORONARY ANGIOGRAPHY;  Surgeon: Sherren Mocha, MD;  Location: [facility];  Service: Cardiovascular;  Laterality: N/A;   THYROID SURGERY     s/p RAI for Grave's    Current Medications: Current Meds  Medication Sig   acetaminophen (TYLENOL) 500 MG tablet Take 500 mg by mouth every 6 (six) hours as needed for mild pain or moderate pain.   albuterol (PROVENTIL) (2.5 MG/3ML) 0.083% nebulizer solution Take 3 mLs (2.5 mg total) by nebulization every 6 (six) hours as needed for wheezing or shortness of breath.   allopurinol (ZYLOPRIM) 100 MG tablet Take 1 tablet (  100 mg total) by mouth daily.   ALPRAZolam (XANAX) 0.25 MG tablet Take 1 tablet (0.25 mg total) by mouth at bedtime as needed for anxiety.   aspirin EC 81 MG EC tablet Take 1 tablet (81 mg total) by mouth daily.   atorvastatin (LIPITOR) 40 MG tablet Take 1 tablet (40 mg total) by mouth daily.   clopidogrel (PLAVIX) 75 MG tablet TAKE 1 TABLET BY MOUTH ONCE DAILY WITH BREAKFAST. (Patient taking differently: Take 75 mg by mouth daily.)    colchicine 0.6 MG tablet Take 1 tablet (0.6 mg total) by mouth 2 (two) times daily.   diltiazem (CARDIZEM CD) 180 MG 24 hr capsule TAKE ONE CAPSULE BY MOUTH EVERY DAY   diltiazem (CARDIZEM) 30 MG tablet Take 1 tablet (30 mg total) by mouth every 6 (six) hours as needed (Palpitations).   ezetimibe (ZETIA) 10 MG tablet Take 1 tablet (10 mg total) by mouth daily.   fluticasone furoate-vilanterol (BREO ELLIPTA) 200-25 MCG/INH AEPB Inhale 1 puff into the lungs daily.   levothyroxine (SYNTHROID) 137 MCG tablet TAKE 1 TABLET BY MOUTH EVERY MORNING ON AN EMPTY STOMACH. (Patient taking differently: Take 137 mcg by mouth daily before breakfast.)   metolazone (ZAROXOLYN) 2.5 MG tablet Take 1 tablet (2.5 mg total) by mouth once a week. 30 min before morning dose of lasix.   metoprolol succinate (TOPROL-XL) 25 MG 24 hr tablet TAKE 1 TABLET BY MOUTH ONCE DAILY.   predniSONE (DELTASONE) 10 MG tablet Take 10 mg by mouth daily with breakfast.   SPIRIVA RESPIMAT 1.25 MCG/ACT AERS Inhale 1 each into the lungs daily.   torsemide (DEMADEX) 20 MG tablet Take 1 tablet (20 mg total) by mouth 2 (two) times daily.   VENTOLIN HFA 108 (90 Base) MCG/ACT inhaler INHALE 2 PUFFS BY MOUTH EVERY 4 HOURS ASNEEDED FOR SHORTNESS OF BREATH. (Patient taking differently: 2 puffs every 4 (four) hours as needed for wheezing or shortness of breath. INHALE 2 PUFFS BY MOUTH EVERY 4 HOURS ASNEEDED FOR SHORTNESS OF BREATH.)   Vitamin D, Ergocalciferol, (DRISDOL) 1.25 MG (50000 UNIT) CAPS capsule Take 50,000 Units by mouth once a week.   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  Tobacco Use   Smoking status: Former    Packs/day: 0.50    Years: 14.00    Total pack years: 7.00    Types: Cigarettes    Quit date: 09/12/2017    Years since quitting: 4.9   Smokeless tobacco: Never  Vaping Use   Vaping Use: Never used  Substance and Sexual Activity   Alcohol use: No   Drug use: No   Sexual activity: Not on file  Other Topics Concern   Not on file  Social History Narrative   Not on file   Social Determinants of Health   Financial Resource Strain: Not on file  Food Insecurity: Not on file  Transportation Needs: Not on file  Physical Activity: Not on file  Stress: Stress Concern Present (09/14/2017)   [place]    Feeling of Stress : Very much  Social Connections: Not on file     Family History: The patient's family history includes Diabetes in her father; Heart disease in her father and mother; Hypertension in her mother. ROS:   Please see the history of present illness.    All 14 point review of systems negative except as described per history of present illness  EKGs/Labs/Other Studies Reviewed:      Recent Labs: 12/07/2021: ALT 21; BUN 18; Creatinine, Ser 1.30; NT-Pro BNP 315; Potassium 3.7; Sodium 140  Recent Lipid Panel    Component Value Date/Time   CHOL 224 (H) 02/25/2021 1322   TRIG 335 (H) 02/25/2021 1322   HDL 64 02/25/2021 1322   CHOLHDL 3.5 02/25/2021 1322   LDLCALC 104 (H) 02/25/2021 1322    Physical Exam:    VS:  BP 136/86 (BP Location: Right Arm, Patient Position: Sitting)   Pulse 88   Ht 5' 1"$  (1.549 m)   Wt 272 lb (123.4 kg)   SpO2 96%   BMI 51.39 kg/m     Wt Readings from Last 3 Encounters:  08/23/22 272 lb (123.4 kg)  03/23/22 269 lb (122 kg)  12/07/21 270 lb (122.5 kg)      GEN:  Well nourished, well developed in no acute distress HEENT: Normal NECK: No JVD; No carotid bruits LYMPHATICS: No lymphadenopathy CARDIAC: RRR, no murmurs, no rubs, no gallops RESPIRATORY:  Clear to auscultation without rales, wheezing or rhonchi  ABDOMEN: Soft, non-tender, non-distended MUSCULOSKELETAL:  No edema; No deformity  SKIN: Warm and dry LOWER EXTREMITIES: no swelling NEUROLOGIC:  Alert and oriented x 3 PSYCHIATRIC:  Normal affect   ASSESSMENT:    No diagnosis found. PLAN:    In order of problems listed above:  Palpitations.  I will ask her to wear Zio patch for 2 weeks to see exactly what can of arrhythmia with dealing with.  I was asked to switch metoprolol to succinate from morning to afternoon also will send prescription for metoprolol heart rate just in case she decided to switch to short acting form with different time scheduled taking it. COPD seems to be stable from that point review actually she sounds pretty good today. History of cardiomyopathy last echocardiogram showed preserved left ventricle ejection fraction. Dyslipidemia I do not have her fasting lipid profile recent last 1 I have is from 2022 with LDL at 104 HDL 64.  Will call primary care physician to get more updated data I was chaperoned by nurse Gerald Stabs during entire visit  Medication Adjustments/Labs and Tests Ordered: Current medicines are reviewed at length with the patient today.  Concerns regarding medicines are outlined above.  No orders of the defined types were placed in this encounter.  Medication changes: No orders of the defined types were placed in this encounter.   Signed, Park Liter, MD, [facility] 08/23/2022 1:56 PM    [place]

## 2022-08-23 NOTE — Patient Instructions (Addendum)
Medication Instructions:   START: Metoprolol Tartrate 12.63m 1 tablet every 8 hours as needed   Lab Work: BMP, ProBNP- Today If you have labs (blood work) drawn today and your tests are completely normal, you will receive your results only by: [place](if you have MyChart) OR A paper copy in the mail If you have any lab test that is abnormal or we need to change your treatment, we will call you to review the results.   Testing/Procedures: None Ordered   Follow-Up: [facility] you and your health needs are our priority.  As part of our continuing mission to provide you with exceptional heart care, we have created designated Provider Care Teams.  These Care Teams include your primary Cardiologist (physician) and Advanced Practice Providers (APPs -  Physician Assistants and Nurse Practitioners) who all work together to provide you with the care you need, when you need it.  We recommend signing up for the patient portal called "MyChart".  Sign up information is provided on this After Visit Summary.  MyChart is used to connect with patients for Virtual Visits (Telemedicine).  Patients are able to view lab/test results, encounter notes, upcoming appointments, etc.  Non-urgent messages can be sent to your provider as well.   To learn more about what you can do with MyChart, go to [email or web address]    Your next appointment:   6 week(s)  The format for your next appointment:   In Person  Provider:   RJenne Campus MD    Other Instructions NA   This visit was accompanied by CLivia Snellen

## 2022-08-25 LAB — BASIC METABOLIC PANEL
BUN/Creatinine Ratio: 15 (ref 9–23)
BUN: 24 mg/dL (ref 6–24)
CO2: 22 mmol/L (ref 20–29)
Calcium: 9.9 mg/dL (ref 8.7–10.2)
Chloride: 96 mmol/L (ref 96–106)
Creatinine, Ser: 1.56 mg/dL — ABNORMAL HIGH (ref 0.57–1.00)
Glucose: 200 mg/dL — ABNORMAL HIGH (ref 70–99)
Potassium: 4.2 mmol/L (ref 3.5–5.2)
Sodium: 137 mmol/L (ref 134–144)
eGFR: 40 mL/min/{1.73_m2} — ABNORMAL LOW (ref >59)

## 2022-08-25 LAB — PRO B NATRIURETIC PEPTIDE: NT-Pro BNP: 404 pg/mL — ABNORMAL HIGH (ref 0–249)

## 2022-08-30 ENCOUNTER — Telehealth: Payer: Self-pay

## 2022-08-30 DIAGNOSIS — I872 Venous insufficiency (chronic) (peripheral): Secondary | ICD-10-CM

## 2022-08-30 HISTORY — DX: Venous insufficiency (chronic) (peripheral): I87.2

## 2022-08-30 NOTE — Telephone Encounter (Signed)
Results reviewed with pt as per Dr. Krasowski's note.  Pt verbalized understanding and had no additional questions. Routed to PCP  

## 2022-09-11 ENCOUNTER — Ambulatory Visit (INDEPENDENT_AMBULATORY_CARE_PROVIDER_SITE_OTHER): Payer: BC Managed Care – PPO

## 2022-09-11 ENCOUNTER — Ambulatory Visit: Payer: BC Managed Care – PPO | Admitting: Podiatry

## 2022-09-11 ENCOUNTER — Other Ambulatory Visit: Payer: Self-pay | Admitting: Podiatry

## 2022-09-11 DIAGNOSIS — M19072 Primary osteoarthritis, left ankle and foot: Secondary | ICD-10-CM | POA: Diagnosis not present

## 2022-09-11 DIAGNOSIS — M659 Synovitis and tenosynovitis, unspecified: Secondary | ICD-10-CM

## 2022-09-11 DIAGNOSIS — M79672 Pain in left foot: Secondary | ICD-10-CM | POA: Diagnosis not present

## 2022-09-11 NOTE — Progress Notes (Signed)
Subjective:  Patient ID: Katie Rasmussen, female    DOB: April 25, 1971,  MRN: [id number]  CC: Left foot and ankle pain  52 y.o. female presents with concern for pain in the left foot and ankle.  She denies any injuries.  She says that she has a history of gout but this does not necessarily feel like gout.  She has pain in the left midfoot as well as the ankle.  She does take steroids which is the only thing she can take to help with pain and inflammation.  Has been taking this.  She also takes colchicine 0.6 mg once a day for gout.  Past Medical History:  Diagnosis Date   Acute combined systolic and diastolic CHF, NYHA class 3 (HCC)    Acute on chronic respiratory failure with hypoxia and hypercapnia (HCC) 09/14/2017   Ankle sprain 02/18/2020   Asthma exacerbation 02/18/2020   Bronchitis due to tobacco use 08/2017   Chest pain 02/18/2020   Chronic respiratory failure (HCC)    CKD (chronic kidney disease), stage III (HCC)    COPD (chronic obstructive pulmonary disease) (HCC)    Coronary artery disease    DCM (dilated cardiomyopathy) (HCC)    Diastolic CHF ([place])    Dyslipidemia 10/08/2017   Dyspnea on exertion 05/07/2018   Elevated troponin    Gout    Hypertension 02/18/2020   Hypothyroidism    h/o Grave's, s/p thyroidectomy & RAI in 1990s   Ischemic cardiomyopathy initial ejection fraction 35 to 40% in March 2019, normalization of ejection fraction based on echocardiogram from 2020 10/08/2017   Ejection fraction 35-40% in March 2019   Leukocytosis 02/18/2020   Obesity hypoventilation syndrome (HCC)    Orthostatic hypotension 02/18/2020   OSA (obstructive sleep apnea) 02/18/2020   Peripheral edema 02/18/2020   Pneumonia 08/2017   Postablative hypothyroidism 04/03/2019   Respiratory acidosis 02/18/2020   Sepsis ([place]) 02/18/2020   Supraventricular tachycardia ([place]) [id number]   Systolic CHF with reduced left ventricular function, NYHA class 3 ([place]) 09/13/2017   Upper respiratory infection  02/18/2020   Weakness 02/18/2020    Allergies  Allergen Reactions   Enoxaparin Sodium Other (See Comments)   Iodine Hives and Itching   Lovenox [Enoxaparin]    No Known Allergies Hives   Sulfa Antibiotics Other (See Comments)   Shellfish Allergy Hives and Itching    ROS: Negative except as per HPI above  Objective:  General: AAO x3, NAD  Dermatological: Moderate edema noted of the left midfoot  Vascular:  Dorsalis Pedis artery and Posterior Tibial artery pedal pulses are 2/4 bilateral.  Capillary fill time < 3 sec to all digits.   Neruologic: Grossly intact via light touch bilateral. Protective threshold intact to all sites bilateral.   Musculoskeletal: Moderate edema of the midfoot and ankle noted with pain on palpation of the midfoot and dorsal lateral ankle around the sinus tarsi and ATFL area.  Gait: Unassisted, Nonantalgic.   No images are attached to the encounter.  Radiographs:  Date: 09/11/2022 XR the left foot Weightbearing AP/Lateral/Oblique   Findings: Attention directed to the left midfoot and rear foot there is no acute osseous abnormality to indicate fracture there is edema noted there is significant arthritic changes with decreased joint space subchondral sclerosis spurring noted about the tarsometatarsal joints. Assessment:   1. Arthritis of left midfoot   2. Synovitis of left ankle   3. Left foot pain      Plan:  Patient was evaluated and treated and all questions  answered.  # Cellulitis of left ankle possible gout acute gout attack of the left ankle # Midfoot arthritis of the left foot -Discussed with the patient that she does appear to have some arthritic changes that could be causing her pain and swelling as well as possibly gout attack. -Recommend increasing the dose of colchicine 0.6 mg twice daily for the next 7 days -Discussed I recommend a steroid injection for left midfoot and ankle edema and pain. -After sterile alcohol prep proceed with  injection of 1 cc half percent Marcaine plain with 1 cc Kenalog 10 into the left dorsal lateral ankle about the sinus tarsi area and at the dorsal midfoot along the area of maximal tenderness palpation at the tarsometatarsal joints. -Recommend proceeding with a cam boot immobilization for the left ankle to prevent instability and pain with walking.  She is having a hard time walking in regular shoes I believe this will be good for her to use as she gets back to walking.  Return in about 4 weeks (around 10/09/2022).          Everitt Amber, DPM [facility] [place] [facility]

## 2022-09-22 ENCOUNTER — Other Ambulatory Visit: Payer: Self-pay | Admitting: Cardiology

## 2022-09-22 DIAGNOSIS — I2510 Atherosclerotic heart disease of native coronary artery without angina pectoris: Secondary | ICD-10-CM

## 2022-09-22 DIAGNOSIS — I255 Ischemic cardiomyopathy: Secondary | ICD-10-CM

## 2022-09-22 DIAGNOSIS — I10 Essential (primary) hypertension: Secondary | ICD-10-CM

## 2022-09-22 DIAGNOSIS — J9611 Chronic respiratory failure with hypoxia: Secondary | ICD-10-CM

## 2022-09-22 DIAGNOSIS — E785 Hyperlipidemia, unspecified: Secondary | ICD-10-CM

## 2022-10-09 ENCOUNTER — Ambulatory Visit: Payer: BC Managed Care – PPO | Admitting: Podiatry

## 2022-10-09 DIAGNOSIS — M19072 Primary osteoarthritis, left ankle and foot: Secondary | ICD-10-CM

## 2022-10-09 DIAGNOSIS — M779 Enthesopathy, unspecified: Secondary | ICD-10-CM

## 2022-10-09 DIAGNOSIS — M659 Synovitis and tenosynovitis, unspecified: Secondary | ICD-10-CM

## 2022-10-09 DIAGNOSIS — M7752 Other enthesopathy of left foot: Secondary | ICD-10-CM

## 2022-10-09 DIAGNOSIS — M79672 Pain in left foot: Secondary | ICD-10-CM

## 2022-10-09 DIAGNOSIS — M7750 Other enthesopathy of unspecified foot: Secondary | ICD-10-CM

## 2022-10-09 NOTE — Progress Notes (Signed)
Subjective:  Patient ID: Katie Rasmussen, female    DOB: 03-12-1971,  MRN: [id number]  CC: Left foot and ankle pain  52 y.o. female presents for follow-up of left foot and ankle pain.  At last visit she was given a steroid Dosepak as well as colchicine.  She takes colchicine chronically from her primary care doctor for gout flares.  Concern was that she was having a gout flare last appointment.  She was also given a steroid injection which after 2 to 3 days did help.  She says her pain is much less in the left foot however she is still having some aching and chronic pain on the bottom of the left arch.  In the midfoot.  Past Medical History:  Diagnosis Date   Acute combined systolic and diastolic CHF, NYHA class 3 (HCC)    Acute on chronic respiratory failure with hypoxia and hypercapnia (HCC) 09/14/2017   Ankle sprain 02/18/2020   Asthma exacerbation 02/18/2020   Bronchitis due to tobacco use 08/2017   Chest pain 02/18/2020   Chronic respiratory failure (HCC)    CKD (chronic kidney disease), stage III (HCC)    COPD (chronic obstructive pulmonary disease) (HCC)    Coronary artery disease    DCM (dilated cardiomyopathy) (HCC)    Diastolic CHF ([place])    Dyslipidemia 10/08/2017   Dyspnea on exertion 05/07/2018   Elevated troponin    Gout    Hypertension 02/18/2020   Hypothyroidism    h/o Grave's, s/p thyroidectomy & RAI in 1990s   Ischemic cardiomyopathy initial ejection fraction 35 to 40% in March 2019, normalization of ejection fraction based on echocardiogram from 2020 10/08/2017   Ejection fraction 35-40% in March 2019   Leukocytosis 02/18/2020   Obesity hypoventilation syndrome (HCC)    Orthostatic hypotension 02/18/2020   OSA (obstructive sleep apnea) 02/18/2020   Peripheral edema 02/18/2020   Pneumonia 08/2017   Postablative hypothyroidism 04/03/2019   Respiratory acidosis 02/18/2020   Sepsis ([place]) 02/18/2020   Supraventricular tachycardia ([place]) [id number]   Systolic CHF with  reduced left ventricular function, NYHA class 3 ([employer]) 09/13/2017   Upper respiratory infection 02/18/2020   Weakness 02/18/2020    Allergies  Allergen Reactions   Enoxaparin Sodium Other (See Comments)   Iodine Hives and Itching   Lovenox [Enoxaparin]    No Known Allergies Hives   Sulfa Antibiotics Other (See Comments)   Shellfish Allergy Hives and Itching    ROS: Negative except as per HPI above  Objective:  General: AAO x3, NAD  Dermatological: Moderate edema noted of the left midfoot  Vascular:  Dorsalis Pedis artery and Posterior Tibial artery pedal pulses are 2/4 bilateral.  Capillary fill time < 3 sec to all digits.   Neruologic: Grossly intact via light touch bilateral. Protective threshold intact to all sites bilateral.   Musculoskeletal: Moderate edema of the midfoot and ankle noted with pain on palpation of the midfoot and dorsal lateral ankle around the sinus tarsi and ATFL area.  Gait: Unassisted, Nonantalgic.   No images are attached to the encounter.  Radiographs:  Date: 09/11/2022 XR the left foot Weightbearing AP/Lateral/Oblique   Findings: Attention directed to the left midfoot and rear foot there is no acute osseous abnormality to indicate fracture there is edema noted there is significant arthritic changes with decreased joint space subchondral sclerosis spurring noted about the tarsometatarsal joints. Assessment:   1. Tendinitis of left foot   2. Arthritis of left midfoot   3. Synovitis of left ankle  4. Left foot pain   5. Capsulitis   6. Tendonitis of ankle       Plan:  Patient was evaluated and treated and all questions answered.  # Left ankle synovitis and gout flare of the left midfoot -Improved after steroid Dosepak as well as colchicine and steroid injection from last visit  # Midfoot arthritis and plantar tendinitis of the left foot -Again discussed findings of chronic arthritic changes in the midfoot -Discussed I recommend a steroid  injection for left plantar midfoot pain -After sterile alcohol prep proceed with injection of 1 cc half percent Marcaine plain with 1 cc Kenalog 10 into the left plantar medial arch  -Follow-up as needed for repeat injections in the future if there is a flareup in pain  Return if symptoms worsen or fail to improve.          Everitt Amber, DPM [facility] [place] [facility]

## 2022-10-11 ENCOUNTER — Encounter: Payer: Self-pay | Admitting: Cardiology

## 2022-10-11 ENCOUNTER — Ambulatory Visit: Payer: BC Managed Care – PPO | Attending: Cardiology | Admitting: Cardiology

## 2022-10-11 VITALS — BP 134/82 | HR 83 | Ht 61.0 in | Wt 280.2 lb

## 2022-10-11 DIAGNOSIS — I255 Ischemic cardiomyopathy: Secondary | ICD-10-CM

## 2022-10-11 DIAGNOSIS — I2510 Atherosclerotic heart disease of native coronary artery without angina pectoris: Secondary | ICD-10-CM | POA: Diagnosis not present

## 2022-10-11 DIAGNOSIS — I471 Supraventricular tachycardia, unspecified: Secondary | ICD-10-CM

## 2022-10-11 DIAGNOSIS — R0609 Other forms of dyspnea: Secondary | ICD-10-CM

## 2022-10-11 NOTE — Patient Instructions (Signed)
Medication Instructions:  Your physician recommends that you continue on your current medications as directed. Please refer to the Current Medication list given to you today.  *If you need a refill on your cardiac medications before your next appointment, please call your pharmacy*   Lab Work: BMP, ProBNP- today   Testing/Procedures: None Ordered   Follow-Up: At [facility], you and your health needs are our priority.  As part of our continuing mission to provide you with exceptional heart care, we have created designated Provider Care Teams.  These Care Teams include your primary Cardiologist (physician) and Advanced Practice Providers (APPs -  Physician Assistants and Nurse Practitioners) who all work together to provide you with the care you need, when you need it.  We recommend signing up for the patient portal called "MyChart".  Sign up information is provided on this After Visit Summary.  MyChart is used to connect with patients for Virtual Visits (Telemedicine).  Patients are able to view lab/test results, encounter notes, upcoming appointments, etc.  Non-urgent messages can be sent to your provider as well.   To learn more about what you can do with MyChart, go to [email or web address].    Your next appointment:   3 month(s)  The format for your next appointment:   In Person  Provider:   Jenne Campus, MD    Other Instructions NA

## 2022-10-11 NOTE — Progress Notes (Signed)
Cardiology Office Note:    Date:  10/11/2022   ID:  Katie Rasmussen, DOB 18-Jan-1971, MRN [id number]  PCP:  Martin Majestic, FNP  Cardiologist:  Jenne Campus, MD    Referring MD: Martin Majestic, *   Chief Complaint  Patient presents with   Shortness of Breath        Weight Gain    History of Present Illness:    Katie Rasmussen is a 52 y.o. female with past medical history significant for cardiomyopathy with normalization of left ventricle ejection fraction, chronic lung disease, chronic respiratory failure, morbid obesity, coronary artery disease status post PTCA and stenting of LAD done in March 2019, history of supraventricular tachycardia.  She comes today to my office she is upset because she gained 10 pounds she also complained of having tachycardia and she thinks everybody ignoring her because anytime she goes to the emergency room she is fine to be in sinus tachycardia and also her primary care physician ignoring this all the time and she is tired of feeling that way.  Denies have any chest pain tightness squeezing pressure burning chest.  She does have multiple pain in multiple joints.  She is being followed by rheumatologist.  She was given colchicine with some improvement.  Still described to have some palpitations sometimes she wakes up in the middle of the night with it.  She does have obstructive sleep apnea, that being managed by local pulmonologist with CPAP.  Past Medical History:  Diagnosis Date   Acute combined systolic and diastolic CHF, NYHA class 3    Acute on chronic respiratory failure with hypoxia and hypercapnia 09/14/2017   Ankle sprain 02/18/2020   Asthma exacerbation 02/18/2020   Bronchitis due to tobacco use 08/2017   Chest pain 02/18/2020   Chronic respiratory failure    CKD (chronic kidney disease), stage III    COPD (chronic obstructive pulmonary disease)    Coronary artery disease    DCM (dilated cardiomyopathy)     Diastolic CHF    Dyslipidemia 10/08/2017   Dyspnea on exertion 05/07/2018   Elevated troponin    Gout    Hypertension 02/18/2020   Hypothyroidism    h/o Grave's, s/p thyroidectomy & RAI in 1990s   Ischemic cardiomyopathy initial ejection fraction 35 to 40% in March 2019, normalization of ejection fraction based on echocardiogram from 2020 10/08/2017   Ejection fraction 35-40% in March 2019   Leukocytosis 02/18/2020   Obesity hypoventilation syndrome    Orthostatic hypotension 02/18/2020   OSA (obstructive sleep apnea) 02/18/2020   Peripheral edema 02/18/2020   Pneumonia 08/2017   Postablative hypothyroidism 04/03/2019   Respiratory acidosis 02/18/2020   Sepsis 02/18/2020   Supraventricular tachycardia [id number]   Systolic CHF with reduced left ventricular function, NYHA class 3 09/13/2017   Upper respiratory infection 02/18/2020   Weakness 02/18/2020    Past Surgical History:  Procedure Laterality Date   CESAREAN SECTION     CORONARY STENT INTERVENTION N/A 09/18/2017   Procedure: CORONARY STENT INTERVENTION;  Surgeon: Sherren Mocha, MD;  Location: [place] [facility];  Service: Cardiovascular;  Laterality: N/A;  prox LAD   LEFT HEART CATH AND CORONARY ANGIOGRAPHY N/A 09/18/2017   Procedure: LEFT HEART CATH AND CORONARY ANGIOGRAPHY;  Surgeon: Sherren Mocha, MD;  Location: [place] [facility];  Service: Cardiovascular;  Laterality: N/A;   THYROID SURGERY     s/p RAI for Grave's    Current Medications: Current Meds  Medication Sig   acetaminophen (TYLENOL)  500 MG tablet Take 500 mg by mouth every 6 (six) hours as needed for mild pain or moderate pain.   albuterol (PROVENTIL) (2.5 MG/3ML) 0.083% nebulizer solution Take 3 mLs (2.5 mg total) by nebulization every 6 (six) hours as needed for wheezing or shortness of breath.   allopurinol (ZYLOPRIM) 100 MG tablet Take 1 tablet (100 mg total) by mouth daily.   ALPRAZolam (XANAX) 0.25 MG tablet Take 1 tablet (0.25 mg total) by mouth at bedtime as  needed for anxiety.   amoxicillin-clavulanate (AUGMENTIN) 875-125 MG tablet Take 1 tablet by mouth 2 (two) times daily.   aspirin EC 81 MG EC tablet Take 1 tablet (81 mg total) by mouth daily.   atorvastatin (LIPITOR) 40 MG tablet Take 1 tablet (40 mg total) by mouth daily.   clopidogrel (PLAVIX) 75 MG tablet TAKE 1 TABLET BY MOUTH ONCE DAILY WITH BREAKFAST. (Patient taking differently: Take 75 mg by mouth daily.)   colchicine 0.6 MG tablet Take 1 tablet (0.6 mg total) by mouth 2 (two) times daily.   cyanocobalamin (,VITAMIN B-12,) 1000 MCG/ML injection INJECT 1ML SUBQ ONCE MONTHLY. (Patient taking differently: Inject 100 mcg into the muscle every 30 (thirty) days.)   diltiazem (CARDIZEM CD) 180 MG 24 hr capsule TAKE ONE CAPSULE BY MOUTH EVERY DAY   diltiazem (CARDIZEM) 30 MG tablet Take 1 tablet (30 mg total) by mouth every 6 (six) hours as needed FOR PALPITATIONS (Patient taking differently: Take 30 mg by mouth every 6 (six) hours as needed (Palpitations).)   ezetimibe (ZETIA) 10 MG tablet Take 1 tablet (10 mg total) by mouth daily.   fluticasone furoate-vilanterol (BREO ELLIPTA) 200-25 MCG/INH AEPB Inhale 1 puff into the lungs daily.   levothyroxine (SYNTHROID) 137 MCG tablet TAKE 1 TABLET BY MOUTH EVERY MORNING ON AN EMPTY STOMACH. (Patient taking differently: Take 137 mcg by mouth daily before breakfast.)   metolazone (ZAROXOLYN) 2.5 MG tablet Take 1 tablet (2.5 mg total) by mouth once a week. 30 min before morning dose of lasix.   metoprolol succinate (TOPROL-XL) 25 MG 24 hr tablet TAKE 1 TABLET BY MOUTH ONCE DAILY.   metoprolol tartrate (LOPRESSOR) 25 MG tablet Take 0.5 tablets (12.5 mg total) by mouth every 8 (eight) hours as needed.   predniSONE (DELTASONE) 10 MG tablet Take 10 mg by mouth daily with breakfast.   SPIRIVA RESPIMAT 1.25 MCG/ACT AERS Inhale 1 each into the lungs daily.   torsemide (DEMADEX) 20 MG tablet Take 1 tablet (20 mg total) by mouth 2 (two) times daily.   VENTOLIN HFA  108 (90 Base) MCG/ACT inhaler INHALE 2 PUFFS BY MOUTH EVERY 4 HOURS ASNEEDED FOR SHORTNESS OF BREATH. (Patient taking differently: Inhale 2 puffs into the lungs every 4 (four) hours as needed for wheezing or shortness of breath. INHALE 2 PUFFS BY MOUTH EVERY 4 HOURS ASNEEDED FOR SHORTNESS OF BREATH.)   Vitamin D, Ergocalciferol, (DRISDOL) 1.25 MG (50000 UNIT) CAPS capsule Take 50,000 Units by mouth once a week.   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  Tobacco Use   Smoking status: Former    Packs/day: 0.50    Years: 14.00    Additional pack years: 0.00    Total pack years: 7.00    Types: Cigarettes    Quit date: 09/12/2017    Years since quitting: 5.0   Smokeless tobacco: Never  Vaping Use   Vaping Use: Never used  Substance and Sexual Activity   Alcohol use: No   Drug use: No   Sexual activity: Not on file  Other Topics Concern   Not on file  Social History Narrative   Not on file   Social Determinants of Health   Financial Resource Strain: Not on file  Food Insecurity: Not on file  Transportation Needs: Not on file  Physical Activity: Not on file  Stress: Stress Concern Present (09/14/2017)   [place]    Feeling of Stress : Very much  Social Connections: Not on file     Family History: The patient's family history includes Diabetes in her father; Heart disease in her  father and mother; Hypertension in her mother. ROS:   Please see the history of present illness.    All 14 point review of systems negative except as described per history of present illness  EKGs/Labs/Other Studies Reviewed:      Recent Labs: 12/07/2021: ALT 21 08/23/2022: BUN 24; Creatinine, Ser 1.56; NT-Pro BNP 404; Potassium 4.2; Sodium 137  Recent Lipid Panel    Component Value Date/Time   CHOL 224 (H) 02/25/2021 1322   TRIG 335 (H) 02/25/2021 1322   HDL 64 02/25/2021 1322   CHOLHDL 3.5 02/25/2021 1322   LDLCALC 104 (H) 02/25/2021 1322    Physical Exam:    VS:  BP 134/82 (BP Location: Left Arm, Patient Position: Sitting)   Pulse 83   Ht 5\' 1"  (1.549 m)   Wt 280 lb 3.2 oz (127.1 kg)   SpO2 98%   BMI 52.94 kg/m     Wt Readings from Last 3 Encounters:  10/11/22 280 lb 3.2 oz (127.1 kg)  08/23/22 272 lb (123.4 kg)  03/23/22 269 lb (122 kg)     GEN:  Well nourished, well developed in no acute distress HEENT: Normal NECK: No JVD; No carotid bruits LYMPHATICS: No lymphadenopathy CARDIAC: RRR, no murmurs, no rubs, no gallops RESPIRATORY:  Clear to auscultation without rales, wheezing or rhonchi  ABDOMEN: Soft, non-tender, non-distended MUSCULOSKELETAL:  No edema; No deformity  SKIN: Warm and dry LOWER EXTREMITIES: no swelling NEUROLOGIC:  Alert and oriented x 3 PSYCHIATRIC:  Normal affect   ASSESSMENT:    1. Coronary artery disease involving native coronary artery of native heart without angina pectoris   2. Supraventricular tachycardia   3. Ischemic cardiomyopathy initial ejection fraction 35 to 40% in March 2019, normalization of ejection fraction based on echocardiogram from 2020   4. Dyspnea on exertion    PLAN:    In order of problems listed above:  Coronary disease stable from that point review and appropriate medications. Weight gain 10 pounds.  I will check proBNP and Chem-7 to see if we can augment her medical therapy with diuretics. History of  ischemic cardiomyopathy but last echocardiogram showed normalization of left ventricle ejection fraction, will continue present management. History of supraventricular tachycardia however anytime she goes to the emergency room with palpitation she is fine to be in sinus tachycardia.  I told her if she had palpitation she can come to my office and we can do EKG and find if there is truly some significant arrhythmia.   Medication Adjustments/Labs and Tests Ordered: Current medicines are reviewed at length with the patient today.  Concerns regarding medicines are outlined above.  No orders of the defined types were placed in this encounter.  Medication changes: No orders of the defined types were placed in this encounter.   Signed, Park Liter, MD, [facility] 10/11/2022 2:56 PM    [place]

## 2022-10-11 NOTE — Addendum Note (Signed)
Addended by: Jacobo Forest D on: 10/11/2022 03:09 PM   Modules accepted: Orders

## 2022-10-13 LAB — BASIC METABOLIC PANEL
BUN/Creatinine Ratio: 14 (ref 9–23)
BUN: 17 mg/dL (ref 6–24)
CO2: 22 mmol/L (ref 20–29)
Calcium: 9.1 mg/dL (ref 8.7–10.2)
Chloride: 100 mmol/L (ref 96–106)
Creatinine, Ser: 1.24 mg/dL — ABNORMAL HIGH (ref 0.57–1.00)
Glucose: 121 mg/dL — ABNORMAL HIGH (ref 70–99)
Potassium: 3.6 mmol/L (ref 3.5–5.2)
Sodium: 142 mmol/L (ref 134–144)
eGFR: 52 mL/min/{1.73_m2} — ABNORMAL LOW (ref >59)

## 2022-10-13 LAB — PRO B NATRIURETIC PEPTIDE: NT-Pro BNP: 764 pg/mL — ABNORMAL HIGH (ref 0–249)

## 2022-10-18 ENCOUNTER — Telehealth: Payer: Self-pay

## 2022-10-18 DIAGNOSIS — I509 Heart failure, unspecified: Secondary | ICD-10-CM

## 2022-10-18 MED ORDER — METOLAZONE 2.5 MG PO TABS: 2.5000 mg | ORAL_TABLET | ORAL | 3 refills | Status: DC

## 2022-10-18 MED ORDER — POTASSIUM CHLORIDE ER 10 MEQ PO TBCR: 10.0000 meq | EXTENDED_RELEASE_TABLET | Freq: Every day | ORAL | 2 refills | Status: AC

## 2022-10-18 NOTE — Telephone Encounter (Signed)
Patient notified of results and recommendations and agreed with plan, medication sent, lab order on file.   

## 2022-10-18 NOTE — Telephone Encounter (Signed)
-----   Message from Georgeanna Lea, MD sent at 10/18/2022  1:45 PM EDT ----- Blood test showed evidence of mild congestive heart failure.  Please ask her to start taking Zaroxolyn twice a week rather than usual once a week.  Please 10 mEq of potassium every single day, she is to have Chem-7 and proBNP done within a week

## 2022-10-24 ENCOUNTER — Telehealth: Payer: Self-pay | Admitting: Cardiology

## 2022-10-24 ENCOUNTER — Encounter: Payer: Self-pay | Admitting: Cardiology

## 2022-10-24 ENCOUNTER — Ambulatory Visit: Payer: BC Managed Care – PPO | Attending: Cardiology | Admitting: Cardiology

## 2022-10-24 VITALS — BP 118/78 | HR 107 | Ht 61.0 in | Wt 270.0 lb

## 2022-10-24 DIAGNOSIS — I509 Heart failure, unspecified: Secondary | ICD-10-CM

## 2022-10-24 DIAGNOSIS — R531 Weakness: Secondary | ICD-10-CM

## 2022-10-24 DIAGNOSIS — R0609 Other forms of dyspnea: Secondary | ICD-10-CM

## 2022-10-24 DIAGNOSIS — I471 Supraventricular tachycardia, unspecified: Secondary | ICD-10-CM

## 2022-10-24 DIAGNOSIS — E785 Hyperlipidemia, unspecified: Secondary | ICD-10-CM

## 2022-10-24 DIAGNOSIS — I5032 Chronic diastolic (congestive) heart failure: Secondary | ICD-10-CM

## 2022-10-24 NOTE — Telephone Encounter (Signed)
Spoke with pt She will come by for an EKG as directed by Dr. Bing Matter in his last office visit note.

## 2022-10-24 NOTE — Patient Instructions (Signed)
Medication Instructions:  Your physician recommends that you continue on your current medications as directed. Please refer to the Current Medication list given to you today.  *If you need a refill on your cardiac medications before your next appointment, please call your pharmacy*   Lab Work: BMP, ProBNP-today If you have labs (blood work) drawn today and your tests are completely normal, you will receive your results only by: MyChart Message (if you have MyChart) OR A paper copy in the mail If you have any lab test that is abnormal or we need to change your treatment, we will call you to review the results.   Testing/Procedures: None Ordered   Follow-Up: At [facility], you and your health needs are our priority.  As part of our continuing mission to provide you with exceptional heart care, we have created designated Provider Care Teams.  These Care Teams include your primary Cardiologist (physician) and Advanced Practice Providers (APPs -  Physician Assistants and Nurse Practitioners) who all work together to provide you with the care you need, when you need it.  We recommend signing up for the patient portal called "MyChart".  Sign up information is provided on this After Visit Summary.  MyChart is used to connect with patients for Virtual Visits (Telemedicine).  Patients are able to view lab/test results, encounter notes, upcoming appointments, etc.  Non-urgent messages can be sent to your provider as well.   To learn more about what you can do with MyChart, go to [email or web address].    Your next appointment:   As Scheduled  The format for your next appointment:   In Person  Provider:   Gypsy Balsam, MD    Other Instructions NA

## 2022-10-24 NOTE — Telephone Encounter (Signed)
.  STAT if HR is under 50 or over 120 (normal HR is 60-100 beats per minute)  What is your heart rate? 144.   Do you have a log of your heart rate readings (document readings)? She states the other readings are about the same as her current hr. When up moving around, it'll get up to 150-155. She states she'll get still, and it'll drop to 120-130's.  Do you have any other symptoms? She states that she is not sob, but with her heart rate high like it is, can't catch her breath. She states she was told by Dr. Bing Matter that when she gets to start having spells, to come in and there could be a test that could be done. She states today would be a good day to do the test since she has yet to come out of this episode. Pt states she woke up in this spell. She states that she took the medicine Dr. Bing Matter gave her, cardizem. However, she is still in 140's, and has been since 8:30am. She is trying to not move because the more she moves, the more it affects her. She says she had a few episodes yesterday, but she came out of them.   Call transferred to triage.

## 2022-10-24 NOTE — Progress Notes (Signed)
Cardiology Office Note:    Date:  10/24/2022   ID:  Katie Rasmussen, DOB 07-07-1971, MRN [id number]  PCP:  [name], FNP  Cardiologist:  Gypsy Balsam, MD    Referring MD: [name], *   Chief Complaint  Patient presents with   Hypertension         History of Present Illness:    Katie Rasmussen is a 52 y.o. female past medical history significant for coronary artery disease, history of cardiomyopathy with normalization morbid obesity chronic respiratory failure.  She rushed to our office today complaining of having palpitations.  When she woke up in the morning still having palpitations.  She took Cardizem she took metoprolol with not much relief there while she ended up coming to our office.  EKG today showed sinus tachycardia actually normal sinus rhythm at the moment for EKG.  With some PVCs otherwise looks the same.  Past Medical History:  Diagnosis Date   Acute combined systolic and diastolic CHF, NYHA class 3    Acute on chronic respiratory failure with hypoxia and hypercapnia 09/14/2017   Ankle sprain 02/18/2020   Asthma exacerbation 02/18/2020   Bronchitis due to tobacco use 08/2017   Chest pain 02/18/2020   Chronic respiratory failure    CKD (chronic kidney disease), stage III    COPD (chronic obstructive pulmonary disease)    Coronary artery disease    DCM (dilated cardiomyopathy)    Diastolic CHF    Dyslipidemia 10/08/2017   Dyspnea on exertion 05/07/2018   Elevated troponin    Gout    Hypertension 02/18/2020   Hypothyroidism    h/o Grave's, s/p thyroidectomy & RAI in 1990s   Ischemic cardiomyopathy initial ejection fraction 35 to 40% in March 2019, normalization of ejection fraction based on echocardiogram from 2020 10/08/2017   Ejection fraction 35-40% in March 2019   Leukocytosis 02/18/2020   Obesity hypoventilation syndrome    Orthostatic hypotension 02/18/2020   OSA (obstructive sleep apnea) 02/18/2020   Peripheral  edema 02/18/2020   Pneumonia 08/2017   Postablative hypothyroidism 04/03/2019   Respiratory acidosis 02/18/2020   Sepsis 02/18/2020   Supraventricular tachycardia 07/08/2020   Systolic CHF with reduced left ventricular function, NYHA class 3 09/13/2017   Upper respiratory infection 02/18/2020   Weakness 02/18/2020    Past Surgical History:  Procedure Laterality Date   CESAREAN SECTION     CORONARY STENT INTERVENTION N/A 09/18/2017   Procedure: CORONARY STENT INTERVENTION;  Surgeon: Tonny Bollman, MD;  Location: [facility];  Service: Cardiovascular;  Laterality: N/A;  prox LAD   LEFT HEART CATH AND CORONARY ANGIOGRAPHY N/A 09/18/2017   Procedure: LEFT HEART CATH AND CORONARY ANGIOGRAPHY;  Surgeon: Tonny Bollman, MD;  Location: [facility];  Service: Cardiovascular;  Laterality: N/A;   THYROID SURGERY     s/p RAI for Grave's    Current Medications: Current Meds  Medication Sig   acetaminophen (TYLENOL) 500 MG tablet Take 500 mg by mouth every 6 (six) hours as needed for mild pain or moderate pain.   albuterol (PROVENTIL) (2.5 MG/3ML) 0.083% nebulizer solution Take 3 mLs (2.5 mg total) by nebulization every 6 (six) hours as needed for wheezing or shortness of breath.   allopurinol (ZYLOPRIM) 100 MG tablet Take 1 tablet (100 mg total) by mouth daily.   ALPRAZolam (XANAX) 0.25 MG tablet Take 1 tablet (0.25 mg total) by mouth at bedtime as needed for anxiety.   amoxicillin-clavulanate (AUGMENTIN) 875-125 MG tablet Take  1 tablet by mouth 2 (two) times daily.   aspirin EC 81 MG EC tablet Take 1 tablet (81 mg total) by mouth daily.   atorvastatin (LIPITOR) 40 MG tablet Take 1 tablet (40 mg total) by mouth daily.   clopidogrel (PLAVIX) 75 MG tablet TAKE 1 TABLET BY MOUTH ONCE DAILY WITH BREAKFAST. (Patient taking differently: Take 75 mg by mouth daily.)   colchicine 0.6 MG tablet Take 1 tablet (0.6 mg total) by mouth 2 (two) times daily.   cyanocobalamin (,VITAMIN B-12,) 1000  MCG/ML injection INJECT SUBQ ONCE MONTHLY. (Patient taking differently: Inject 100 mcg into the muscle every 30 (thirty) days.)   diltiazem (CARDIZEM CD) 180 MG 24 hr capsule TAKE ONE CAPSULE BY MOUTH EVERY DAY   diltiazem (CARDIZEM) 30 MG tablet Take 1 tablet (30 mg total) by mouth every 6 (six) hours as needed FOR PALPITATIONS (Patient taking differently: Take 30 mg by mouth every 6 (six) hours as needed (Palpitations).)   ezetimibe (ZETIA) 10 MG tablet Take 1 tablet (10 mg total) by mouth daily.   fluticasone furoate-vilanterol (BREO ELLIPTA) 200-25 MCG/INH AEPB Inhale 1 puff into the lungs daily.   levothyroxine (SYNTHROID) 137 MCG tablet TAKE 1 TABLET BY MOUTH EVERY MORNING ON AN EMPTY STOMACH. (Patient taking differently: Take 137 mcg by mouth daily before breakfast.)   metolazone (ZAROXOLYN) 2.5 MG tablet Take 1 tablet (2.5 mg total) by mouth 2 (two) times a week.   metoprolol succinate (TOPROL-XL) 25 MG 24 hr tablet TAKE 1 TABLET BY MOUTH ONCE DAILY.   metoprolol tartrate (LOPRESSOR) 25 MG tablet Take 0.5 tablets (12.5 mg total) by mouth every 8 (eight) hours as needed. (Patient taking differently: Take 12.5 mg by mouth every 8 (eight) hours as needed (elevated HR).)   potassium chloride (KLOR-CON) 10 MEQ tablet Take 1 tablet (10 mEq total) by mouth daily.   predniSONE (DELTASONE) 10 MG tablet Take 10 mg by mouth daily with breakfast.   SPIRIVA RESPIMAT 1.25 MCG/ACT AERS Inhale 1 each into the lungs daily.   torsemide (DEMADEX) 20 MG tablet Take 1 tablet (20 mg total) by mouth 2 (two) times daily.   VENTOLIN HFA 108 (90 Base) MCG/ACT inhaler INHALE 2 PUFFS BY MOUTH EVERY 4 HOURS ASNEEDED FOR SHORTNESS OF BREATH. (Patient taking differently: Inhale 2 puffs into the lungs every 4 (four) hours as needed for wheezing or shortness of breath. INHALE 2 PUFFS BY MOUTH EVERY 4 HOURS ASNEEDED FOR SHORTNESS OF BREATH.)   Vitamin D, Ergocalciferol, (DRISDOL) 1.25 MG (50000 UNIT) CAPS capsule Take  50,000 Units by mouth once a week.   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  Tobacco Use   Smoking status: Former    Packs/day: 0.50    Years: 14.00    Additional pack years: 0.00    Total pack years: 7.00    Types: Cigarettes    Quit date: 09/12/2017    Years since quitting: 5.1  Smokeless tobacco: Never  Vaping Use   Vaping Use: Never used  Substance and Sexual Activity   Alcohol use: No   Drug use: No   Sexual activity: Not on file  Other Topics Concern   Not on file  Social History Narrative   Not on file   Social Determinants of Health   Financial Resource Strain: Not on file  Food Insecurity: Not on file  Transportation Needs: Not on file  Physical Activity: Not on file  Stress: Stress Concern Present (09/14/2017)   [employer] of Occupational Health - Occupational Stress Questionnaire    Feeling of Stress : Very much  Social Connections: Not on file     Family History: The patient's family history includes Diabetes in her father; Heart disease in her father and mother; Hypertension in her mother. ROS:   Please see the history of present illness.    All 14 point review of systems negative except as described per history of present illness  EKGs/Labs/Other Studies Reviewed:      Recent Labs: 12/07/2021: ALT 21 10/11/2022: BUN 17; Creatinine, Ser 1.24; NT-Pro BNP 764; Potassium 3.6; Sodium 142  Recent Lipid Panel     Component Value Date/Time   CHOL 224 (H) 02/25/2021 1322   TRIG 335 (H) 02/25/2021 1322   HDL 64 02/25/2021 1322   CHOLHDL 3.5 02/25/2021 1322   LDLCALC 104 (H) 02/25/2021 1322    Physical Exam:    VS:  BP 118/78 (BP Location: Left Arm, Patient Position: Sitting)   Pulse (!) 107   Ht 5\' 1"  (1.549 m)   Wt 270 lb (122.5 kg)   SpO2 95%   BMI 51.02 kg/m     Wt Readings from Last 3 Encounters:  10/24/22 270 lb (122.5 kg)  10/11/22 280 lb 3.2 oz (127.1 kg)  08/23/22 272 lb (123.4 kg)     GEN:  Well nourished, well developed in no acute distress HEENT: Normal NECK: No JVD; No carotid bruits LYMPHATICS: No lymphadenopathy CARDIAC: RRR, no murmurs, no rubs, no gallops RESPIRATORY:  Clear to auscultation without rales, wheezing or rhonchi  ABDOMEN: Soft, non-tender, non-distended MUSCULOSKELETAL:  No edema; No deformity  SKIN: Warm and dry LOWER EXTREMITIES: no swelling NEUROLOGIC:  Alert and oriented x 3 PSYCHIATRIC:  Normal affect   ASSESSMENT:    1. Congestive heart failure, unspecified HF chronicity, unspecified heart failure type   2. Dyspnea on exertion   3. Weakness   4. Chronic diastolic congestive heart failure   5. Supraventricular tachycardia   6. Dyslipidemia    PLAN:    In order of problems listed above:  Palpitations: I asked her to take another half of the Toprol when she gets home and if that does not help with a half an hour I asked him to take half of 0.25 Xanax. Dyspnea on exertion stable.  She actually started feeling better.  Will check a proBNP today as well as Chem-7. Chronic diastolic congestive heart failure looks compensated.  Plan as described above   Medication Adjustments/Labs and Tests Ordered: Current medicines are reviewed at length with the patient today.  Concerns regarding medicines are outlined above.  Orders Placed This Encounter  Procedures   Basic metabolic panel   Pro b natriuretic peptide (BNP)   CBC   Medication changes:  No orders of the defined types were placed in this encounter.   Signed, Georgeanna Lea, MD, [facility] 10/24/2022 4:38 PM    [facility]

## 2022-10-25 LAB — CBC
Hematocrit: 44.7 % (ref 34.0–46.6)
Hemoglobin: 14.5 g/dL (ref 11.1–15.9)
MCH: 29.5 pg (ref 26.6–33.0)
MCHC: 32.4 g/dL (ref 31.5–35.7)
MCV: 91 fL (ref 79–97)
Platelets: 410 10*3/uL (ref 150–450)
RBC: 4.91 x10E6/uL (ref 3.77–5.28)
RDW: 16.2 % — ABNORMAL HIGH (ref 11.7–15.4)
WBC: 12.2 10*3/uL — ABNORMAL HIGH (ref 3.4–10.8)

## 2022-10-25 LAB — BASIC METABOLIC PANEL
BUN/Creatinine Ratio: 13 (ref 9–23)
BUN: 20 mg/dL (ref 6–24)
CO2: 23 mmol/L (ref 20–29)
Calcium: 10.5 mg/dL — ABNORMAL HIGH (ref 8.7–10.2)
Chloride: 91 mmol/L — ABNORMAL LOW (ref 96–106)
Creatinine, Ser: 1.59 mg/dL — ABNORMAL HIGH (ref 0.57–1.00)
Glucose: 153 mg/dL — ABNORMAL HIGH (ref 70–99)
Potassium: 3.5 mmol/L (ref 3.5–5.2)
Sodium: 137 mmol/L (ref 134–144)
eGFR: 39 mL/min/{1.73_m2} — ABNORMAL LOW (ref >59)

## 2022-10-25 LAB — PRO B NATRIURETIC PEPTIDE: NT-Pro BNP: 210 pg/mL (ref 0–249)

## 2022-10-25 NOTE — Addendum Note (Signed)
Addended by: Heywood Bene on: 10/25/2022 11:19 AM   Modules accepted: Orders

## 2022-10-27 ENCOUNTER — Telehealth: Payer: Self-pay | Admitting: Cardiology

## 2022-10-27 ENCOUNTER — Telehealth: Payer: Self-pay

## 2022-10-27 NOTE — Telephone Encounter (Signed)
Pt would like a callback regarding her results from test on 4/16. Please advise.

## 2022-10-27 NOTE — Telephone Encounter (Signed)
Spoke with pt about her lab results. She stated that her heart was still racing and she was tearful. Per Dr. Bing Matter advised she take an extra  Metoprolol to see if this will help . Pt agreed and verbalized understanding. Encouraged to call if she has any problems or issues.

## 2022-11-21 ENCOUNTER — Ambulatory Visit (INDEPENDENT_AMBULATORY_CARE_PROVIDER_SITE_OTHER): Payer: BC Managed Care – PPO | Admitting: Podiatry

## 2022-11-21 ENCOUNTER — Ambulatory Visit: Payer: BC Managed Care – PPO | Admitting: Podiatry

## 2022-11-21 DIAGNOSIS — M109 Gout, unspecified: Secondary | ICD-10-CM

## 2022-11-21 MED ORDER — METHYLPREDNISOLONE 4 MG PO TBPK: ORAL_TABLET | ORAL | 0 refills | Status: DC

## 2022-11-21 MED ORDER — COLCHICINE 0.6 MG PO TABS: 0.6000 mg | ORAL_TABLET | Freq: Every day | ORAL | 0 refills | Status: DC

## 2022-11-21 NOTE — Progress Notes (Signed)
Subjective:  Patient ID: Katie Rasmussen, female    DOB: March 21, 1971,  MRN: [id number]  CC: Left foot and ankle pain  52 y.o. female presents for follow-up of left foot and ankle pain.  Patient having severe pain in the right ankle at this time.  She is thinks it is a gout flare.  She had a uric acid level checked recently and was 15.7.  Her primary care doctor is referring her to a nephrologist.  Past Medical History:  Diagnosis Date   Acute combined systolic and diastolic CHF, NYHA class 3 (HCC)    Acute on chronic respiratory failure with hypoxia and hypercapnia (HCC) 09/14/2017   Ankle sprain 02/18/2020   Asthma exacerbation 02/18/2020   Bronchitis due to tobacco use 08/2017   Chest pain 02/18/2020   Chronic respiratory failure (HCC)    CKD (chronic kidney disease), stage III (HCC)    COPD (chronic obstructive pulmonary disease) (HCC)    Coronary artery disease    DCM (dilated cardiomyopathy) (HCC)    Diastolic CHF (HCC)    Dyslipidemia 10/08/2017   Dyspnea on exertion 05/07/2018   Elevated troponin    Gout    Hypertension 02/18/2020   Hypothyroidism    h/o Grave's, s/p thyroidectomy & RAI in 1990s   Ischemic cardiomyopathy initial ejection fraction 35 to 40% in March 2019, normalization of ejection fraction based on echocardiogram from 2020 10/08/2017   Ejection fraction 35-40% in March 2019   Leukocytosis 02/18/2020   Obesity hypoventilation syndrome (HCC)    Orthostatic hypotension 02/18/2020   OSA (obstructive sleep apnea) 02/18/2020   Peripheral edema 02/18/2020   Pneumonia 08/2017   Postablative hypothyroidism 04/03/2019   Respiratory acidosis 02/18/2020   Sepsis (HCC) 02/18/2020   Supraventricular tachycardia 07/08/2020   Systolic CHF with reduced left ventricular function, NYHA class 3 (HCC) 09/13/2017   Upper respiratory infection 02/18/2020   Weakness 02/18/2020    Allergies  Allergen Reactions   Enoxaparin Sodium Other (See Comments)   Iodine Hives and Itching    Lovenox [Enoxaparin]    No Known Allergies Hives   Sulfa Antibiotics Other (See Comments)   Shellfish Allergy Hives and Itching    ROS: Negative except as per HPI above  Objective:  General: AAO x3, NAD  Dermatological: Moderate edema noted of the left midfoot  Vascular:  Dorsalis Pedis artery and Posterior Tibial artery pedal pulses are 2/4 bilateral.  Capillary fill time < 3 sec to all digits.   Neruologic: Grossly intact via light touch bilateral. Protective threshold intact to all sites bilateral.   Musculoskeletal: Moderate edema of the midfoot and ankle noted with severe pain on palpation of the midfoot and dorsal lateral ankle around the sinus tarsi and ATFL area on the right ankle.  Gait: Unassisted, Nonantalgic.   No images are attached to the encounter.  Radiographs:  Date: 09/11/2022 XR the left foot Weightbearing AP/Lateral/Oblique   Findings: Attention directed to the left midfoot and rear foot there is no acute osseous abnormality to indicate fracture there is edema noted there is significant arthritic changes with decreased joint space subchondral sclerosis spurring noted about the tarsometatarsal joints. Assessment:   1. Acute gout of right ankle, unspecified cause      Plan:  Patient was evaluated and treated and all questions answered.  # Right ankle synovitis and gout flare of the right ankle -Recommend injection as well as steroid Dosepak and colchicine -Proceed with steroid injection as below. -Injection of 1 cc half percent Marcaine plain  with 1 cc Kenalog 10 in the right dorsal lateral ankle -Also recommend E Rx for methylprednisolone 4 mg steroid taper pack take as directed for 6 days -Rx for colchicine 0.6 mg take once daily for 7 days with 2 refill -Will follow-up as needed          Corinna Gab, DPM [facility] / [facility]

## 2022-11-21 NOTE — Addendum Note (Signed)
Addended by: Carlena Hurl F on: 11/21/2022 04:37 PM   Modules accepted: Orders

## 2023-01-09 ENCOUNTER — Other Ambulatory Visit: Payer: Self-pay

## 2023-01-09 ENCOUNTER — Emergency Department (HOSPITAL_COMMUNITY): Payer: BC Managed Care – PPO

## 2023-01-09 ENCOUNTER — Encounter: Payer: Self-pay | Admitting: Cardiology

## 2023-01-09 ENCOUNTER — Ambulatory Visit: Payer: BC Managed Care – PPO | Attending: Cardiology | Admitting: Cardiology

## 2023-01-09 ENCOUNTER — Inpatient Hospital Stay (HOSPITAL_COMMUNITY)
Admission: EM | Admit: 2023-01-09 | Discharge: 2023-01-13 | DRG: 309 | Disposition: A | Discharge status: Home Health | Payer: BC Managed Care – PPO | Attending: Internal Medicine | Admitting: Internal Medicine

## 2023-01-09 VITALS — BP 140/80 | HR 160 | Ht 60.0 in | Wt 276.0 lb

## 2023-01-09 DIAGNOSIS — I959 Hypotension, unspecified: Secondary | ICD-10-CM | POA: Diagnosis present

## 2023-01-09 DIAGNOSIS — N1832 Chronic kidney disease, stage 3b: Secondary | ICD-10-CM | POA: Diagnosis present

## 2023-01-09 DIAGNOSIS — I252 Old myocardial infarction: Secondary | ICD-10-CM

## 2023-01-09 DIAGNOSIS — I255 Ischemic cardiomyopathy: Secondary | ICD-10-CM

## 2023-01-09 DIAGNOSIS — Z79899 Other long term (current) drug therapy: Secondary | ICD-10-CM | POA: Diagnosis not present

## 2023-01-09 DIAGNOSIS — R079 Chest pain, unspecified: Secondary | ICD-10-CM | POA: Diagnosis present

## 2023-01-09 DIAGNOSIS — F419 Anxiety disorder, unspecified: Secondary | ICD-10-CM | POA: Diagnosis present

## 2023-01-09 DIAGNOSIS — Z955 Presence of coronary angioplasty implant and graft: Secondary | ICD-10-CM

## 2023-01-09 DIAGNOSIS — J8410 Pulmonary fibrosis, unspecified: Secondary | ICD-10-CM | POA: Diagnosis present

## 2023-01-09 DIAGNOSIS — Z7952 Long term (current) use of systemic steroids: Secondary | ICD-10-CM

## 2023-01-09 DIAGNOSIS — Z9981 Dependence on supplemental oxygen: Secondary | ICD-10-CM

## 2023-01-09 DIAGNOSIS — Z1152 Encounter for screening for COVID-19: Secondary | ICD-10-CM

## 2023-01-09 DIAGNOSIS — J9621 Acute and chronic respiratory failure with hypoxia: Secondary | ICD-10-CM

## 2023-01-09 DIAGNOSIS — Z87891 Personal history of nicotine dependence: Secondary | ICD-10-CM | POA: Diagnosis not present

## 2023-01-09 DIAGNOSIS — I5041 Acute combined systolic (congestive) and diastolic (congestive) heart failure: Secondary | ICD-10-CM | POA: Diagnosis not present

## 2023-01-09 DIAGNOSIS — E662 Morbid (severe) obesity with alveolar hypoventilation: Secondary | ICD-10-CM | POA: Diagnosis present

## 2023-01-09 DIAGNOSIS — I4891 Unspecified atrial fibrillation: Secondary | ICD-10-CM | POA: Diagnosis not present

## 2023-01-09 DIAGNOSIS — Z91041 Radiographic dye allergy status: Secondary | ICD-10-CM | POA: Diagnosis not present

## 2023-01-09 DIAGNOSIS — Z7951 Long term (current) use of inhaled steroids: Secondary | ICD-10-CM

## 2023-01-09 DIAGNOSIS — J449 Chronic obstructive pulmonary disease, unspecified: Secondary | ICD-10-CM | POA: Diagnosis present

## 2023-01-09 DIAGNOSIS — Z882 Allergy status to sulfonamides status: Secondary | ICD-10-CM

## 2023-01-09 DIAGNOSIS — Z79890 Hormone replacement therapy (postmenopausal): Secondary | ICD-10-CM | POA: Diagnosis not present

## 2023-01-09 DIAGNOSIS — Z7902 Long term (current) use of antithrombotics/antiplatelets: Secondary | ICD-10-CM

## 2023-01-09 DIAGNOSIS — E890 Postprocedural hypothyroidism: Secondary | ICD-10-CM | POA: Diagnosis present

## 2023-01-09 DIAGNOSIS — M1A9XX0 Chronic gout, unspecified, without tophus (tophi): Secondary | ICD-10-CM | POA: Diagnosis present

## 2023-01-09 DIAGNOSIS — Z7982 Long term (current) use of aspirin: Secondary | ICD-10-CM

## 2023-01-09 DIAGNOSIS — I2510 Atherosclerotic heart disease of native coronary artery without angina pectoris: Secondary | ICD-10-CM | POA: Diagnosis present

## 2023-01-09 DIAGNOSIS — I130 Hypertensive heart and chronic kidney disease with heart failure and stage 1 through stage 4 chronic kidney disease, or unspecified chronic kidney disease: Secondary | ICD-10-CM | POA: Diagnosis present

## 2023-01-09 DIAGNOSIS — Z91013 Allergy to seafood: Secondary | ICD-10-CM

## 2023-01-09 DIAGNOSIS — I471 Supraventricular tachycardia, unspecified: Principal | ICD-10-CM | POA: Diagnosis present

## 2023-01-09 DIAGNOSIS — I5042 Chronic combined systolic (congestive) and diastolic (congestive) heart failure: Secondary | ICD-10-CM | POA: Diagnosis present

## 2023-01-09 DIAGNOSIS — I420 Dilated cardiomyopathy: Secondary | ICD-10-CM | POA: Diagnosis present

## 2023-01-09 DIAGNOSIS — E785 Hyperlipidemia, unspecified: Secondary | ICD-10-CM | POA: Diagnosis present

## 2023-01-09 DIAGNOSIS — Z8249 Family history of ischemic heart disease and other diseases of the circulatory system: Secondary | ICD-10-CM

## 2023-01-09 DIAGNOSIS — N390 Urinary tract infection, site not specified: Secondary | ICD-10-CM | POA: Diagnosis present

## 2023-01-09 DIAGNOSIS — J9611 Chronic respiratory failure with hypoxia: Secondary | ICD-10-CM | POA: Diagnosis present

## 2023-01-09 DIAGNOSIS — Z6841 Body Mass Index (BMI) 40.0 and over, adult: Secondary | ICD-10-CM

## 2023-01-09 DIAGNOSIS — Z888 Allergy status to other drugs, medicaments and biological substances status: Secondary | ICD-10-CM

## 2023-01-09 DIAGNOSIS — D72829 Elevated white blood cell count, unspecified: Secondary | ICD-10-CM | POA: Diagnosis present

## 2023-01-09 DIAGNOSIS — E876 Hypokalemia: Secondary | ICD-10-CM | POA: Diagnosis present

## 2023-01-09 DIAGNOSIS — I5020 Unspecified systolic (congestive) heart failure: Secondary | ICD-10-CM | POA: Diagnosis present

## 2023-01-09 DIAGNOSIS — J9622 Acute and chronic respiratory failure with hypercapnia: Secondary | ICD-10-CM

## 2023-01-09 DIAGNOSIS — Z833 Family history of diabetes mellitus: Secondary | ICD-10-CM

## 2023-01-09 HISTORY — DX: Unspecified atrial fibrillation: I48.91

## 2023-01-09 LAB — COMPREHENSIVE METABOLIC PANEL
ALT: 25 U/L (ref 0–44)
AST: 28 U/L (ref 15–41)
Albumin: 3.3 g/dL — ABNORMAL LOW (ref 3.5–5.0)
Alkaline Phosphatase: 102 U/L (ref 38–126)
Anion gap: 16 — ABNORMAL HIGH (ref 5–15)
BUN: 11 mg/dL (ref 6–20)
CO2: 25 mmol/L (ref 22–32)
Calcium: 9.1 mg/dL (ref 8.9–10.3)
Chloride: 94 mmol/L — ABNORMAL LOW (ref 98–111)
Creatinine, Ser: 1.49 mg/dL — ABNORMAL HIGH (ref 0.44–1.00)
GFR, Estimated: 42 mL/min — ABNORMAL LOW (ref >60)
Glucose, Bld: 161 mg/dL — ABNORMAL HIGH (ref 70–99)
Potassium: 3 mmol/L — ABNORMAL LOW (ref 3.5–5.1)
Sodium: 135 mmol/L (ref 135–145)
Total Bilirubin: 0.6 mg/dL (ref 0.3–1.2)
Total Protein: 6.5 g/dL (ref 6.5–8.1)

## 2023-01-09 LAB — RESP PANEL BY RT-PCR (RSV, FLU A&B, COVID)  RVPGX2
Influenza A by PCR: NEGATIVE
Influenza B by PCR: NEGATIVE
Resp Syncytial Virus by PCR: NEGATIVE
SARS Coronavirus 2 by RT PCR: NEGATIVE

## 2023-01-09 LAB — CBC WITH DIFFERENTIAL/PLATELET
Abs Immature Granulocytes: 0.12 10*3/uL — ABNORMAL HIGH (ref 0.00–0.07)
Basophils Absolute: 0.1 10*3/uL (ref 0.0–0.1)
Basophils Relative: 0 %
Eosinophils Absolute: 0.4 10*3/uL (ref 0.0–0.5)
Eosinophils Relative: 2 %
HCT: 41.1 % (ref 36.0–46.0)
Hemoglobin: 13.4 g/dL (ref 12.0–15.0)
Immature Granulocytes: 1 %
Lymphocytes Relative: 14 %
Lymphs Abs: 2.4 10*3/uL (ref 0.7–4.0)
MCH: 29.8 pg (ref 26.0–34.0)
MCHC: 32.6 g/dL (ref 30.0–36.0)
MCV: 91.5 fL (ref 80.0–100.0)
Monocytes Absolute: 0.9 10*3/uL (ref 0.1–1.0)
Monocytes Relative: 5 %
Neutro Abs: 13 10*3/uL — ABNORMAL HIGH (ref 1.7–7.7)
Neutrophils Relative %: 78 %
Platelets: 352 10*3/uL (ref 150–400)
RBC: 4.49 MIL/uL (ref 3.87–5.11)
RDW: 14.1 % (ref 11.5–15.5)
WBC: 16.8 10*3/uL — ABNORMAL HIGH (ref 4.0–10.5)
nRBC: 0 % (ref 0.0–0.2)

## 2023-01-09 LAB — TSH: TSH: 16.352 u[IU]/mL — ABNORMAL HIGH (ref 0.350–4.500)

## 2023-01-09 LAB — BRAIN NATRIURETIC PEPTIDE: B Natriuretic Peptide: 86.3 pg/mL (ref 0.0–100.0)

## 2023-01-09 LAB — TROPONIN I (HIGH SENSITIVITY)
Troponin I (High Sensitivity): 14 ng/L (ref <18)
Troponin I (High Sensitivity): 17 ng/L (ref <18)

## 2023-01-09 LAB — T4, FREE: Free T4: 0.86 ng/dL (ref 0.61–1.12)

## 2023-01-09 MED ORDER — LACTATED RINGERS IV BOLUS
500.0000 mL | Freq: Once | INTRAVENOUS | Status: AC
  Administered 2023-01-09: 500 mL via INTRAVENOUS

## 2023-01-09 MED ORDER — APIXABAN 5 MG PO TABS
5.0000 mg | ORAL_TABLET | Freq: Two times a day (BID) | ORAL | Status: DC
  Filled 2023-01-09 (×2): qty 1

## 2023-01-09 MED ORDER — POTASSIUM CHLORIDE 20 MEQ PO PACK
40.0000 meq | PACK | Freq: Once | ORAL | Status: AC
  Administered 2023-01-10: 40 meq via ORAL
  Filled 2023-01-09: qty 2

## 2023-01-09 MED ORDER — POTASSIUM CHLORIDE CRYS ER 20 MEQ PO TBCR
40.0000 meq | EXTENDED_RELEASE_TABLET | Freq: Once | ORAL | Status: AC
  Administered 2023-01-09: 40 meq via ORAL
  Filled 2023-01-09: qty 2

## 2023-01-09 NOTE — ED Provider Notes (Signed)
[name] Provider Note   CSN: [id number] Arrival date & time: 01/09/23  1744     History {Add pertinent medical, surgical, social history, OB history to HPI:1} Chief Complaint  Patient presents with   Chest Pain    CP and SOB x 6 days. Hx of CHF Recent hospital visit for afib and placed on PO Cardizem. EMS reports HR 160s to 180s. Pt was given 20mg  iv push cardizem and pts HR now low 100s. Pt reports was SOB and having CP prior to being converted. Pt reports upon arrival just being fatigued.     Katie Rasmussen is a 52 y.o. female.   Chest Pain      Home Medications Prior to Admission medications   Medication Sig Start Date End Date Taking? Authorizing Provider  acetaminophen (TYLENOL) 500 MG tablet Take 500 mg by mouth every 6 (six) hours as needed for mild pain or moderate pain.    [provider]  albuterol (PROVENTIL) (2.5 MG/3ML) 0.083% nebulizer solution Take 3 mLs (2.5 mg total) by nebulization every 6 (six) hours as needed for wheezing or shortness of breath. 03/11/19   Steffanie Dunn, DO  allopurinol (ZYLOPRIM) 100 MG tablet Take 1 tablet (100 mg total) by mouth daily. 10/25/21   Asencion Islam, DPM  ALPRAZolam Prudy Feeler) 0.25 MG tablet Take 1 tablet (0.25 mg total) by mouth at bedtime as needed for anxiety. 07/15/20   Georgeanna Lea, MD  amoxicillin-clavulanate (AUGMENTIN) 875-125 MG tablet Take 1 tablet by mouth 2 (two) times daily. 08/04/21   [provider]  aspirin EC 81 MG EC tablet Take 1 tablet (81 mg total) by mouth daily. 09/21/17   Rhetta Mura, MD  atorvastatin (LIPITOR) 40 MG tablet Take 1 tablet (40 mg total) by mouth daily. 03/01/21   Georgeanna Lea, MD  clopidogrel (PLAVIX) 75 MG tablet TAKE 1 TABLET BY MOUTH ONCE DAILY WITH BREAKFAST. Patient taking differently: Take 75 mg by mouth daily. 02/28/22   Georgeanna Lea, MD  colchicine 0.6 MG tablet Take 1 tablet (0.6 mg  total) by mouth 2 (two) times daily. 10/25/21   Asencion Islam, DPM  colchicine 0.6 MG tablet Take 1 tablet (0.6 mg total) by mouth daily for 7 days. 11/21/22 11/28/22  Standiford, Jenelle Mages, DPM  cyanocobalamin (,VITAMIN B-12,) 1000 MCG/ML injection INJECT SUBQ ONCE MONTHLY. Patient taking differently: Inject 100 mcg into the muscle every 30 (thirty) days. 07/19/20   Pascal Lux, NP  diltiazem (CARDIZEM CD) 180 MG 24 hr capsule TAKE ONE CAPSULE BY MOUTH EVERY DAY 02/27/22   Georgeanna Lea, MD  diltiazem (CARDIZEM) 30 MG tablet Take 1 tablet (30 mg total) by mouth every 6 (six) hours as needed FOR PALPITATIONS Patient taking differently: Take 30 mg by mouth every 6 (six) hours as needed (Palpitations). 09/22/22   Georgeanna Lea, MD  ezetimibe (ZETIA) 10 MG tablet Take 1 tablet (10 mg total) by mouth daily. 06/15/21   Reather Littler, MD  fluticasone furoate-vilanterol (BREO ELLIPTA) 200-25 MCG/INH AEPB Inhale 1 puff into the lungs daily. 03/11/19   Steffanie Dunn, DO  levothyroxine (SYNTHROID) 137 MCG tablet TAKE 1 TABLET BY MOUTH EVERY MORNING ON AN EMPTY STOMACH. Patient taking differently: Take 137 mcg by mouth daily before breakfast. 11/21/21   Reather Littler, MD  methylPREDNISolone (MEDROL DOSEPAK) 4 MG TBPK tablet Take as directed for 6 days 11/21/22   Standiford, Jenelle Mages, DPM  metolazone (ZAROXOLYN) 2.5  MG tablet Take 1 tablet (2.5 mg total) by mouth 2 (two) times a week. 10/19/22 01/17/23  Georgeanna Lea, MD  metoprolol succinate (TOPROL-XL) 25 MG 24 hr tablet TAKE 1 TABLET BY MOUTH ONCE DAILY. 02/28/22   Georgeanna Lea, MD  metoprolol tartrate (LOPRESSOR) 25 MG tablet Take 0.5 tablets (12.5 mg total) by mouth every 8 (eight) hours as needed. Patient taking differently: Take 12.5 mg by mouth every 8 (eight) hours as needed (elevated HR). 08/23/22   Georgeanna Lea, MD  potassium chloride (KLOR-CON) 10 MEQ tablet Take 1 tablet (10 mEq total) by mouth daily. 10/18/22 01/16/23   Georgeanna Lea, MD  predniSONE (DELTASONE) 10 MG tablet Take 10 mg by mouth daily with breakfast. 08/04/21   [provider]  SPIRIVA RESPIMAT 1.25 MCG/ACT AERS Inhale 1 each into the lungs daily. 10/07/21   [provider]  torsemide (DEMADEX) 20 MG tablet Take 1 tablet (20 mg total) by mouth 2 (two) times daily. 05/19/22   Georgeanna Lea, MD  VENTOLIN HFA 108 (90 Base) MCG/ACT inhaler INHALE 2 PUFFS BY MOUTH EVERY 4 HOURS ASNEEDED FOR SHORTNESS OF BREATH. Patient taking differently: Inhale 2 puffs into the lungs every 4 (four) hours as needed for wheezing or shortness of breath. INHALE 2 PUFFS BY MOUTH EVERY 4 HOURS ASNEEDED FOR SHORTNESS OF BREATH. 12/09/21   Georgeanna Lea, MD  Vitamin D, Ergocalciferol, (DRISDOL) 1.25 MG (50000 UNIT) CAPS capsule Take 50,000 Units by mouth once a week. 09/08/20   [provider]      Allergies    Enoxaparin sodium, Iodine, Lovenox [enoxaparin], No known allergies, Sulfa antibiotics, and Shellfish allergy    Review of Systems   Review of Systems  Cardiovascular:  Positive for chest pain.    Physical Exam Updated Vital Signs BP 127/72   Pulse (!) 101   Temp 98.2 F (36.8 C) (Oral)   Resp 17   Ht 5\' 2"  (1.575 m)   Wt 124.7 kg   SpO2 100%   BMI 50.30 kg/m  Physical Exam  ED Results / Procedures / Treatments   Labs (all labs ordered are listed, but only abnormal results are displayed) Labs Reviewed  CBC WITH DIFFERENTIAL/PLATELET - Abnormal; Notable for the following components:      Result Value   WBC 16.8 (*)    Neutro Abs 13.0 (*)    Abs Immature Granulocytes 0.12 (*)    All other components within normal limits  COMPREHENSIVE METABOLIC PANEL - Abnormal; Notable for the following components:   Potassium 3.0 (*)    Chloride 94 (*)    Glucose, Bld 161 (*)    Creatinine, Ser 1.49 (*)    Albumin 3.3 (*)    GFR, Estimated 42 (*)    Anion gap 16 (*)    All other components within normal limits  TSH -  Abnormal; Notable for the following components:   TSH 16.352 (*)    All other components within normal limits  RESP PANEL BY RT-PCR (RSV, FLU A&B, COVID)  RVPGX2  BRAIN NATRIURETIC PEPTIDE  T4, FREE  TROPONIN I (HIGH SENSITIVITY)  TROPONIN I (HIGH SENSITIVITY)    EKG None  Radiology DG Chest Portable 1 View  Result Date: 01/09/2023 CLINICAL DATA:  Shortness of EXAM: PORTABLE CHEST 1 VIEW COMPARISON:  01/02/2023, 10/06/2022, CT 08/31/2022 FINDINGS: Prominent epicardial pericardial fat. Minimal atelectasis right base. Stable cardiomediastinal silhouette. No pneumothorax IMPRESSION: Minimal right base atelectasis. Electronically Signed   By: Selena Batten  Jake Samples M.D.   On: 01/09/2023 18:42    Procedures Procedures  {Document cardiac monitor, telemetry assessment procedure when appropriate:1}  Medications Ordered in ED Medications  apixaban (ELIQUIS) tablet 5 mg (has no administration in time range)  potassium chloride SA (KLOR-CON M) CR tablet 40 mEq (has no administration in time range)  lactated ringers bolus 500 mL (0 mLs Intravenous Stopped 01/09/23 2049)    ED Course/ Medical Decision Making/ A&P Clinical Course as of 01/09/23 2216  Tue Jan 09, 2023  1856 DG Chest Portable 1 View [RR]    Clinical Course User Index [RR] Achille Rich, PA-C   {   Click here for ABCD2, HEART and other calculatorsREFRESH Note before signing :1}                          Medical Decision Making Amount and/or Complexity of Data Reviewed Labs: ordered. Radiology: ordered. Decision-making details documented in ED Course.  Risk Prescription drug management.   ***  {Document critical care time when appropriate:1} {Document review of labs and clinical decision tools ie heart score, Chads2Vasc2 etc:1}  {Document your independent review of radiology images, and any outside records:1} {Document your discussion with family members, caretakers, and with consultants:1} {Document social determinants of  health affecting pt's care:1} {Document your decision making why or why not admission, treatments were needed:1} Final Clinical Impression(s) / ED Diagnoses Final diagnoses:  None    Rx / DC Orders ED Discharge Orders     None

## 2023-01-09 NOTE — Consult Note (Signed)
Cardiology Consult  Patient Name: Katie Rasmussen Date of Encounter: 01/09/2023 Primary Care Physician: [name], FNP Cardiologist: Gypsy Balsam, MD  Chief Complaint   Shortness of breath, tachycardia  Patient Profile   52 yo female with history of CAD and prior DES to the LAD in 2019, ICM with LVEF 30-35%, normalized in 2023, now presents to the cardiology office with shortness of breath and tachycardia, send to [facility] ER for CHF/SVT  HPI   This is a 52 y.o. female with a past medical history significant for coronary artery disease with prior drug-eluting stent to the LAD in 2019, ischemic cardiomyopathy with LVEF 30 to 35% that normalized by echo in 2023 followed by Dr. Bing Matter.  She was seen in the office today because of feeling "miserable" for the past week.  According to Dr. Vanetta Shawl note she had reported 6 days of shortness of breath, weakness and tachycardia.  EKG there showed narrow complex tachycardia at 168, read as SVT however appears to be more atrial fibrillation.  Reportedly the patient was given diltiazem and route with improvement in heart rate and her EKG in the emergency department showed a sinus tachycardia at 103.  She denies any chills or fever but does have a history of asthma, obstructive sleep apnea and obesity hypoventilation syndrome.  She was quite dyspneic in the office although was reportedly less dyspneic on arrival. She reports decreased oral intake over the past week with nausea. She has continued to take her diuretics and tells me she has stage 3 CKG. BP in the ER is soft in the 80's/90's systolic. Initial labs show a leukocytosis, but this has been chronic and she is on steroids. She currently denies any chest pain or dyspnea.  PMHx   Past Medical History:  Diagnosis Date   Acute combined systolic and diastolic CHF, NYHA class 3 (HCC)    Acute on chronic respiratory failure with hypoxia and hypercapnia (HCC) 09/14/2017   Ankle  sprain 02/18/2020   Asthma exacerbation 02/18/2020   Bronchitis due to tobacco use 08/2017   Chest pain 02/18/2020   Chronic respiratory failure (HCC)    CKD (chronic kidney disease), stage III (HCC)    COPD (chronic obstructive pulmonary disease) (HCC)    Coronary artery disease    DCM (dilated cardiomyopathy) (HCC)    Diastolic CHF (HCC)    Dyslipidemia 10/08/2017   Dyspnea on exertion 05/07/2018   Elevated troponin    Gout    Hypertension 02/18/2020   Hypothyroidism    h/o Grave's, s/p thyroidectomy & RAI in 1990s   Ischemic cardiomyopathy initial ejection fraction 35 to 40% in March 2019, normalization of ejection fraction based on echocardiogram from 2020 10/08/2017   Ejection fraction 35-40% in March 2019   Leukocytosis 02/18/2020   Obesity hypoventilation syndrome (HCC)    Orthostatic hypotension 02/18/2020   OSA (obstructive sleep apnea) 02/18/2020   Peripheral edema 02/18/2020   Pneumonia 08/2017   Postablative hypothyroidism 04/03/2019   Respiratory acidosis 02/18/2020   Sepsis (HCC) 02/18/2020   Supraventricular tachycardia 07/08/2020   Systolic CHF with reduced left ventricular function, NYHA class 3 (HCC) 09/13/2017   Upper respiratory infection 02/18/2020   Weakness 02/18/2020    Past Surgical History:  Procedure Laterality Date   CESAREAN SECTION     CORONARY STENT INTERVENTION N/A 09/18/2017   Procedure: CORONARY STENT INTERVENTION;  Surgeon: Tonny Bollman, MD;  Location: [facility] INVASIVE CV LAB;  Service: Cardiovascular;  Laterality: N/A;  prox LAD   LEFT  HEART CATH AND CORONARY ANGIOGRAPHY N/A 09/18/2017   Procedure: LEFT HEART CATH AND CORONARY ANGIOGRAPHY;  Surgeon: Tonny Bollman, MD;  Location: [facility] INVASIVE CV LAB;  Service: Cardiovascular;  Laterality: N/A;   THYROID SURGERY     s/p RAI for Grave's    FAMHx   Family History  Problem Relation Age of Onset   Heart disease Mother    Hypertension Mother    Heart disease Father    Diabetes Father     SOCHx     reports that she quit smoking about 5 years ago. Her smoking use included cigarettes. She has a 7.00 pack-year smoking history. She has never used smokeless tobacco. She reports that she does not drink alcohol and does not use drugs.  Outpatient Medications   Current Facility-Administered Medications on File Prior to Encounter  Medication Dose Route Frequency Provider Last Rate Last Admin   triamcinolone acetonide (KENALOG) 10 MG/ML injection 10 mg  10 mg Other Once Asencion Islam, DPM       Current Outpatient Medications on File Prior to Encounter  Medication Sig Dispense Refill   acetaminophen (TYLENOL) 500 MG tablet Take 500 mg by mouth every 6 (six) hours as needed for mild pain or moderate pain.     albuterol (PROVENTIL) (2.5 MG/3ML) 0.083% nebulizer solution Take 3 mLs (2.5 mg total) by nebulization every 6 (six) hours as needed for wheezing or shortness of breath. 75 mL 12   allopurinol (ZYLOPRIM) 100 MG tablet Take 1 tablet (100 mg total) by mouth daily. 30 tablet 6   ALPRAZolam (XANAX) 0.25 MG tablet Take 1 tablet (0.25 mg total) by mouth at bedtime as needed for anxiety. 10 tablet 0   amoxicillin-clavulanate (AUGMENTIN) 875-125 MG tablet Take 1 tablet by mouth 2 (two) times daily.     aspirin EC 81 MG EC tablet Take 1 tablet (81 mg total) by mouth daily.     atorvastatin (LIPITOR) 40 MG tablet Take 1 tablet (40 mg total) by mouth daily. 90 tablet 3   clopidogrel (PLAVIX) 75 MG tablet TAKE 1 TABLET BY MOUTH ONCE DAILY WITH BREAKFAST. (Patient taking differently: Take 75 mg by mouth daily.) 90 tablet 3   colchicine 0.6 MG tablet Take 1 tablet (0.6 mg total) by mouth 2 (two) times daily. 20 tablet 0   colchicine 0.6 MG tablet Take 1 tablet (0.6 mg total) by mouth daily for 7 days. 7 tablet 0   cyanocobalamin (,VITAMIN B-12,) 1000 MCG/ML injection INJECT SUBQ ONCE MONTHLY. (Patient taking differently: Inject 100 mcg into the muscle every 30 (thirty) days.) 3 mL 4   diltiazem (CARDIZEM  CD) 180 MG 24 hr capsule TAKE ONE CAPSULE BY MOUTH EVERY DAY 90 capsule 0   diltiazem (CARDIZEM) 30 MG tablet Take 1 tablet (30 mg total) by mouth every 6 (six) hours as needed FOR PALPITATIONS (Patient taking differently: Take 30 mg by mouth every 6 (six) hours as needed (Palpitations).) 90 tablet 3   ezetimibe (ZETIA) 10 MG tablet Take 1 tablet (10 mg total) by mouth daily. 30 tablet 3   fluticasone furoate-vilanterol (BREO ELLIPTA) 200-25 MCG/INH AEPB Inhale 1 puff into the lungs daily. 1 each 11   levothyroxine (SYNTHROID) 137 MCG tablet TAKE 1 TABLET BY MOUTH EVERY MORNING ON AN EMPTY STOMACH. (Patient taking differently: Take 137 mcg by mouth daily before breakfast.) 90 tablet 2   methylPREDNISolone (MEDROL DOSEPAK) 4 MG TBPK tablet Take as directed for 6 days 1 each 0   metolazone (ZAROXOLYN) 2.5  MG tablet Take 1 tablet (2.5 mg total) by mouth 2 (two) times a week. 8 tablet 3   metoprolol succinate (TOPROL-XL) 25 MG 24 hr tablet TAKE 1 TABLET BY MOUTH ONCE DAILY. 90 tablet 3   metoprolol tartrate (LOPRESSOR) 25 MG tablet Take 0.5 tablets (12.5 mg total) by mouth every 8 (eight) hours as needed. (Patient taking differently: Take 12.5 mg by mouth every 8 (eight) hours as needed (elevated HR).) 180 tablet 3   potassium chloride (KLOR-CON) 10 MEQ tablet Take 1 tablet (10 mEq total) by mouth daily. 30 tablet 2   predniSONE (DELTASONE) 10 MG tablet Take 10 mg by mouth daily with breakfast.     SPIRIVA RESPIMAT 1.25 MCG/ACT AERS Inhale 1 each into the lungs daily.     torsemide (DEMADEX) 20 MG tablet Take 1 tablet (20 mg total) by mouth 2 (two) times daily. 180 tablet 2   VENTOLIN HFA 108 (90 Base) MCG/ACT inhaler INHALE 2 PUFFS BY MOUTH EVERY 4 HOURS ASNEEDED FOR SHORTNESS OF BREATH. (Patient taking differently: Inhale 2 puffs into the lungs every 4 (four) hours as needed for wheezing or shortness of breath. INHALE 2 PUFFS BY MOUTH EVERY 4 HOURS ASNEEDED FOR SHORTNESS OF BREATH.) 18 g 0   Vitamin D,  Ergocalciferol, (DRISDOL) 1.25 MG (50000 UNIT) CAPS capsule Take 50,000 Units by mouth once a week.      Inpatient Medications    Scheduled Meds:  apixaban  5 mg Oral BID   triamcinolone acetonide  10 mg Other Once    Continuous Infusions:   PRN Meds:    ALLERGIES   Allergies  Allergen Reactions   Enoxaparin Sodium Other (See Comments)   Iodine Hives and Itching   Lovenox [Enoxaparin]    No Known Allergies Hives   Sulfa Antibiotics Other (See Comments)   Shellfish Allergy Hives and Itching    ROS   Pertinent items noted in HPI and remainder of comprehensive ROS otherwise negative.  Vitals   Vitals:   01/09/23 1843 01/09/23 1845 01/09/23 1900 01/09/23 1915  BP:  (!) 98/53 (!) 92/42 (!) 81/54  Pulse: (!) 139 (!) 106 100 97  Resp: 17 19 18  (!) 24  Temp:      TempSrc:      SpO2: 100% 99% 100% 100%  Weight:      Height:       No intake or output data in the 24 hours ending 01/09/23 2007 Filed Weights   01/09/23 1755  Weight: 124.7 kg    Physical Exam   General appearance: alert, no distress, morbidly obese, and pale Neck: no carotid bruit, no JVD, and thyroid not enlarged, symmetric, no tenderness/mass/nodules Lungs: diminished breath sounds bibasilar Heart: regular rate and rhythm Abdomen: soft, non-tender; bowel sounds normal; no masses,  no organomegaly and obese Extremities: extremities normal, atraumatic, no cyanosis or edema Pulses: 2+ and symmetric Skin: Skin color, texture, turgor normal. No rashes or lesions Neurologic: Grossly normal Psych: Tearful, anxious  Labs   Results for orders placed or performed during the hospital encounter of 01/09/23 (from the past 48 hour(s))  CBC with Differential     Status: Abnormal   Collection Time: 01/09/23  5:55 PM  Result Value Ref Range   WBC 16.8 (H) 4.0 - 10.5 K/uL   RBC 4.49 3.87 - 5.11 MIL/uL   Hemoglobin 13.4 12.0 - 15.0 g/dL   HCT 45.4 09.8 - 11.9 %   MCV 91.5 80.0 - 100.0 fL   MCH 29.8 26.0 -  34.0 pg   MCHC 32.6 30.0 - 36.0 g/dL   RDW 19.1 47.8 - 29.5 %   Platelets 352 150 - 400 K/uL   nRBC 0.0 0.0 - 0.2 %   Neutrophils Relative % 78 %   Neutro Abs 13.0 (H) 1.7 - 7.7 K/uL   Lymphocytes Relative 14 %   Lymphs Abs 2.4 0.7 - 4.0 K/uL   Monocytes Relative 5 %   Monocytes Absolute 0.9 0.1 - 1.0 K/uL   Eosinophils Relative 2 %   Eosinophils Absolute 0.4 0.0 - 0.5 K/uL   Basophils Relative 0 %   Basophils Absolute 0.1 0.0 - 0.1 K/uL   Immature Granulocytes 1 %   Abs Immature Granulocytes 0.12 (H) 0.00 - 0.07 K/uL    Comment: Performed at [facility], [address] [place], [place] [address]  Resp panel by RT-PCR (RSV, Flu A&B, Covid) Anterior Nasal Swab     Status: None   Collection Time: 01/09/23  6:16 PM   Specimen: Anterior Nasal Swab  Result Value Ref Range   SARS Coronavirus 2 by RT PCR NEGATIVE NEGATIVE   Influenza A by PCR NEGATIVE NEGATIVE   Influenza B by PCR NEGATIVE NEGATIVE    Comment: (NOTE) The Xpert Xpress SARS-CoV-2/FLU/RSV plus assay is intended as an aid in the diagnosis of influenza from Nasopharyngeal swab specimens and should not be used as a sole basis for treatment. Nasal washings and aspirates are unacceptable for Xpert Xpress SARS-CoV-2/FLU/RSV testing.  Fact Sheet for Patients: BloggerCourse.com  Fact Sheet for Healthcare Providers: SeriousBroker.it  This test is not yet approved or cleared by the [place] FDA and has been authorized for detection and/or diagnosis of SARS-CoV-2 by FDA under an Emergency Use Authorization (EUA). This EUA will remain in effect (meaning this test can be used) for the duration of the COVID-19 declaration under Section 564(b)(1) of the Act, 21 U.S.C. section 360bbb-3(b)(1), unless the authorization is terminated or revoked.     Resp Syncytial Virus by PCR NEGATIVE NEGATIVE    Comment: (NOTE) Fact Sheet for  Patients: BloggerCourse.com  Fact Sheet for Healthcare Providers: SeriousBroker.it  This test is not yet approved or cleared by the [place] FDA and has been authorized for detection and/or diagnosis of SARS-CoV-2 by FDA under an Emergency Use Authorization (EUA). This EUA will remain in effect (meaning this test can be used) for the duration of the COVID-19 declaration under Section 564(b)(1) of the Act, 21 U.S.C. section 360bbb-3(b)(1), unless the authorization is terminated or revoked.  Performed at [facility], [address], [place], [place] [address]   Brain natriuretic peptide     Status: None   Collection Time: 01/09/23  6:16 PM  Result Value Ref Range   B Natriuretic Peptide 86.3 0.0 - 100.0 pg/mL    Comment: Performed at [facility], [address], [place], [place] [address]  TSH     Status: Abnormal   Collection Time: 01/09/23  6:16 PM  Result Value Ref Range   TSH 16.352 (H) 0.350 - 4.500 uIU/mL    Comment: Performed by a 3rd Generation assay with a functional sensitivity of <=0.01 uIU/mL. Performed at [facility], [address] [place], [place] [address]     ECG   Atrial fibrillation at 168- Personally Reviewed  Telemetry   Sinus tachycardia at 103- Personally Reviewed  Radiology   DG Chest Portable 1 View  Result Date: 01/09/2023 CLINICAL DATA:  Shortness of EXAM:  PORTABLE CHEST 1 VIEW COMPARISON:  01/02/2023, 10/06/2022, CT 08/31/2022 FINDINGS: Prominent epicardial pericardial fat. Minimal atelectasis right base. Stable cardiomediastinal silhouette. No pneumothorax IMPRESSION: Minimal right base atelectasis. Electronically Signed   By: Jasmine Pang M.D.   On: 01/09/2023 18:42    Cardiac Studies   Pending  Assessment   Principal Problem:   New onset atrial fibrillation [facility]) Active Problems:   Coronary artery disease drug-eluting stent implanted to proximal LAD in March  2019 in face of acute myocardial infarction   Leukocytosis   Plan   New onset atrial fibrillation EKG personally reviewed and is irregularly irregular and tachycardic, consistent with new onset atrial fibrillation. She has multiple risk factors for this. Fortunately, she converted back to sinus rhythm in the ER. She now feels much better and I suspect her symptoms were from rapid Afib. BP is currently soft - with little oral intake and continued diuretics, I suspect she is dry - exam is not consistent with volume overload. Will give 500 mL back now - awaiting labs. Will ultimately need another anti-arryhtmic strategy although options are limited (has pulmonary fibrosis - so would avoid amio, history of CAD and CHF - hypotension may be a limiting factor, CKD3 - would avoid digoxin, ?dofetilide.  Repeat echo. Stop Plavix and switch to Eliquis 5 mg BID. CAD No reports of chest pain - really dyspnea/tachycardia, which has resolved. Troponins pending. Continue current CAD medications except Plavix, will switch to Eliquis. Chronic combined systolic and diastolic heart failure LVEF had normalized as of 03/2022 - repeat echo is pending. Does not appear overtly volume-overloaded on exam. Hypotensive, will give volume- await labs (BNP, metabolic, etc). Adjust home diuretics as necessary. OHS/OSA Continue CPAP or BIPAP.  Continue spiriva and ventolin inhalers Gout Apparently on chronic steroid therapy for this - would continue HLD On atorvastatin and ezetimibe - repeat lipid profile in am, goal LDL<70 Leukocytosis Suspect chronic d/t steroids + stress reaction from afib, no fever or other s/s of infection  Time Spent Directly with Patient:  I have spent a total of 45 minutes with patient reviewing hospital notes, telemetry, EKGs, labs and examining the patient as well as establishing an assessment and plan that was discussed with the patient.  > 50% of time was spent in direct patient care.   Length  of Stay:  LOS: 0 days   Chrystie Nose, MD, [facility], FACP  [place]  [facility]  Medical Director of the [facility] Lipid [facility]  Cardiovascular Risk Reduction Clinic Diplomate of the American Board of Clinical Lipidology Attending Cardiologist  Direct Dial: [phone]  Fax: [phone]  Website:  [email or web address] 01/09/2023, 8:07 PM

## 2023-01-09 NOTE — Assessment & Plan Note (Signed)
8 echo pending, per cardiology note September 2023 echo had normal EF

## 2023-01-09 NOTE — Progress Notes (Signed)
Cardiology Office Note:    Date:  01/09/2023   ID:  Katie Rasmussen, DOB 09-17-1970, MRN [id number]  PCP:  [name], FNP  Cardiologist:  Gypsy Balsam, MD    Referring MD: [name], *   Chief Complaint  Patient presents with   Shortness of Breath    History of Present Illness:    Katie Rasmussen is a 52 y.o. female with past medical history significant for coronary artery disease, she did have drug-eluting stent to proximal LAD in March 2019 in face of acute myocardial infarction, history of cardiomyopathy with ejection fraction 30 to 35% however with appropriate guideline directed medical therapy improvement to normalization with last echocardiogram done in April 05, 2022, also history of supraventricular tachycardia, morbid obesity, essential hypertension, asthma, obstructive sleep apnea. She presented to my office today for follow-up she said for last 6 days she feels absolutely miserable she is very short of breath she is very weak she is tachycardic analysis of her EKG showed paroxysmal supraventricular tachycardia she tells me already that she took today 2 dosages of metoprolol as well as some Xanax in spite of that still tachycardic.  Denies having chills or fever but just profoundly short of breath.  She is clearly short of breath on the physical exam   Past Medical History:  Diagnosis Date   Acute combined systolic and diastolic CHF, NYHA class 3 (HCC)    Acute on chronic respiratory failure with hypoxia and hypercapnia (HCC) 09/14/2017   Ankle sprain 02/18/2020   Asthma exacerbation 02/18/2020   Bronchitis due to tobacco use 08/2017   Chest pain 02/18/2020   Chronic respiratory failure (HCC)    CKD (chronic kidney disease), stage III (HCC)    COPD (chronic obstructive pulmonary disease) (HCC)    Coronary artery disease    DCM (dilated cardiomyopathy) (HCC)    Diastolic CHF (HCC)    Dyslipidemia 10/08/2017   Dyspnea on exertion  05/07/2018   Elevated troponin    Gout    Hypertension 02/18/2020   Hypothyroidism    h/o Grave's, s/p thyroidectomy & RAI in 1990s   Ischemic cardiomyopathy initial ejection fraction 35 to 40% in March 2019, normalization of ejection fraction based on echocardiogram from 2020 10/08/2017   Ejection fraction 35-40% in March 2019   Leukocytosis 02/18/2020   Obesity hypoventilation syndrome (HCC)    Orthostatic hypotension 02/18/2020   OSA (obstructive sleep apnea) 02/18/2020   Peripheral edema 02/18/2020   Pneumonia 08/2017   Postablative hypothyroidism 04/03/2019   Respiratory acidosis 02/18/2020   Sepsis (HCC) 02/18/2020   Supraventricular tachycardia 07/08/2020   Systolic CHF with reduced left ventricular function, NYHA class 3 (HCC) 09/13/2017   Upper respiratory infection 02/18/2020   Weakness 02/18/2020    Past Surgical History:  Procedure Laterality Date   CESAREAN SECTION     CORONARY STENT INTERVENTION N/A 09/18/2017   Procedure: CORONARY STENT INTERVENTION;  Surgeon: Tonny Bollman, MD;  Location: [facility] INVASIVE CV LAB;  Service: Cardiovascular;  Laterality: N/A;  prox LAD   LEFT HEART CATH AND CORONARY ANGIOGRAPHY N/A 09/18/2017   Procedure: LEFT HEART CATH AND CORONARY ANGIOGRAPHY;  Surgeon: Tonny Bollman, MD;  Location: [facility] INVASIVE CV LAB;  Service: Cardiovascular;  Laterality: N/A;   THYROID SURGERY     s/p RAI for Grave's    Current Medications: Current Meds  Medication Sig   acetaminophen (TYLENOL) 500 MG tablet Take 500 mg by mouth every 6 (six) hours as needed for mild pain or  moderate pain.   albuterol (PROVENTIL) (2.5 MG/3ML) 0.083% nebulizer solution Take 3 mLs (2.5 mg total) by nebulization every 6 (six) hours as needed for wheezing or shortness of breath.   allopurinol (ZYLOPRIM) 100 MG tablet Take 1 tablet (100 mg total) by mouth daily.   ALPRAZolam (XANAX) 0.25 MG tablet Take 1 tablet (0.25 mg total) by mouth at bedtime as needed for anxiety.    amoxicillin-clavulanate (AUGMENTIN) 875-125 MG tablet Take 1 tablet by mouth 2 (two) times daily.   aspirin EC 81 MG EC tablet Take 1 tablet (81 mg total) by mouth daily.   atorvastatin (LIPITOR) 40 MG tablet Take 1 tablet (40 mg total) by mouth daily.   clopidogrel (PLAVIX) 75 MG tablet TAKE 1 TABLET BY MOUTH ONCE DAILY WITH BREAKFAST. (Patient taking differently: Take 75 mg by mouth daily.)   colchicine 0.6 MG tablet Take 1 tablet (0.6 mg total) by mouth 2 (two) times daily.   cyanocobalamin (,VITAMIN B-12,) 1000 MCG/ML injection INJECT SUBQ ONCE MONTHLY. (Patient taking differently: Inject 100 mcg into the muscle every 30 (thirty) days.)   diltiazem (CARDIZEM CD) 180 MG 24 hr capsule TAKE ONE CAPSULE BY MOUTH EVERY DAY   diltiazem (CARDIZEM) 30 MG tablet Take 1 tablet (30 mg total) by mouth every 6 (six) hours as needed FOR PALPITATIONS (Patient taking differently: Take 30 mg by mouth every 6 (six) hours as needed (Palpitations).)   ezetimibe (ZETIA) 10 MG tablet Take 1 tablet (10 mg total) by mouth daily.   fluticasone furoate-vilanterol (BREO ELLIPTA) 200-25 MCG/INH AEPB Inhale 1 puff into the lungs daily.   levothyroxine (SYNTHROID) 137 MCG tablet TAKE 1 TABLET BY MOUTH EVERY MORNING ON AN EMPTY STOMACH. (Patient taking differently: Take 137 mcg by mouth daily before breakfast.)   methylPREDNISolone (MEDROL DOSEPAK) 4 MG TBPK tablet Take as directed for 6 days   metolazone (ZAROXOLYN) 2.5 MG tablet Take 1 tablet (2.5 mg total) by mouth 2 (two) times a week.   metoprolol succinate (TOPROL-XL) 25 MG 24 hr tablet TAKE 1 TABLET BY MOUTH ONCE DAILY.   metoprolol tartrate (LOPRESSOR) 25 MG tablet Take 0.5 tablets (12.5 mg total) by mouth every 8 (eight) hours as needed. (Patient taking differently: Take 12.5 mg by mouth every 8 (eight) hours as needed (elevated HR).)   predniSONE (DELTASONE) 10 MG tablet Take 10 mg by mouth daily with breakfast.   SPIRIVA RESPIMAT 1.25 MCG/ACT AERS Inhale 1 each  into the lungs daily.   torsemide (DEMADEX) 20 MG tablet Take 1 tablet (20 mg total) by mouth 2 (two) times daily.   VENTOLIN HFA 108 (90 Base) MCG/ACT inhaler INHALE 2 PUFFS BY MOUTH EVERY 4 HOURS ASNEEDED FOR SHORTNESS OF BREATH. (Patient taking differently: Inhale 2 puffs into the lungs every 4 (four) hours as needed for wheezing or shortness of breath. INHALE 2 PUFFS BY MOUTH EVERY 4 HOURS ASNEEDED FOR SHORTNESS OF BREATH.)   Vitamin D, Ergocalciferol, (DRISDOL) 1.25 MG (50000 UNIT) CAPS capsule Take 50,000 Units by mouth once a week.   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  Tobacco Use   Smoking status: Former    Packs/day: 0.50    Years: 14.00    Additional pack years: 0.00    Total pack years: 7.00    Types: Cigarettes    Quit date: 09/12/2017    Years since quitting: 5.3   Smokeless tobacco: Never  Vaping Use   Vaping Use: Never used  Substance and Sexual Activity   Alcohol use: No   Drug use: No   Sexual activity: Not on file  Other Topics Concern   Not on file  Social History Narrative   Not on file   Social Determinants of Health   Financial Resource Strain: Not on file  Food Insecurity: Not on file  Transportation Needs: Not on file  Physical Activity: Not on file  Stress: Stress Concern Present (09/14/2017)   [employer] of Occupational Health - Occupational Stress Questionnaire    Feeling of Stress : Very much  Social  Connections: Not on file     Family History: The patient's family history includes Diabetes in her father; Heart disease in her father and mother; Hypertension in her mother. ROS:   Please see the history of present illness.    All 14 point review of systems negative except as described per history of present illness  EKGs/Labs/Other Studies Reviewed:    EKG Interpretation Date/Time:  Tuesday January 09 2023 16:11:14 EDT Ventricular Rate:  168 PR Interval:    QRS Duration:  62 QT Interval:  264 QTC Calculation: 441 R Axis:   -23  Text Interpretation: Supraventricular tachycardia Low voltage QRS Possible Anterolateral infarct (cited on or before 23-Mar-2022) Abnormal ECG When compared with ECG of 23-Mar-2022 11:04, Atrial fibrillation has replaced Sinus rhythm Vent. rate has increased BY  84 BPM Questionable change in initial forces of Anterior leads Nonspecific T wave abnormality now evident in Inferior leads Nonspecific T wave abnormality now evident in Lateral leads Confirmed by Gypsy Balsam [phone]) on 01/09/2023 4:18:41 PM    Recent Labs: 10/24/2022: BUN 20; Creatinine, Ser 1.59; Hemoglobin 14.5; NT-Pro BNP 210; Platelets 410; Potassium 3.5; Sodium 137  Recent Lipid Panel    Component Value Date/Time   CHOL 224 (H) 02/25/2021 1322   TRIG 335 (H) 02/25/2021 1322   HDL 64 02/25/2021 1322   CHOLHDL 3.5 02/25/2021 1322   LDLCALC 104 (H) 02/25/2021 1322    Physical Exam:    VS:  BP (!) 140/80 (BP Location: Right Arm, Patient Position: Sitting)   Pulse (!) 160   Ht 5' (1.524 m)   Wt 276 lb (125.2 kg)   SpO2 97%   BMI 53.90 kg/m     Wt Readings from Last 3 Encounters:  01/09/23 276 lb (125.2 kg)  10/24/22 270 lb (122.5 kg)  10/11/22 280 lb 3.2 oz (127.1 kg)     GEN:  Well nourished, well developed in no acute distress HEENT: Normal NECK: Difficult to assess secondary to short 5 neck; No carotid bruits LYMPHATICS: No lymphadenopathy CARDIAC: RRR, no murmurs, no rubs,  no gallops RESPIRATORY: Poor air entry bilaterally with bilateral wheezes ABDOMEN: Soft, non-tender, non-distended MUSCULOSKELETAL:  No edema; No deformity  SKIN: Warm and dry LOWER EXTREMITIES: no swelling NEUROLOGIC:  Alert and oriented x 3 PSYCHIATRIC:  Normal affect   ASSESSMENT:    1. Acute combined systolic and diastolic CHF, NYHA class 3 (HCC)  2. Ischemic cardiomyopathy initial ejection fraction 35 to 40% in March 2019, normalization of ejection fraction based on echocardiogram from 2020   3. Coronary artery disease involving native coronary artery of native heart without angina pectoris   4. Supraventricular tachycardia   5. Acute on chronic respiratory failure with hypoxia and hypercapnia (HCC)   6. Obesity hypoventilation syndrome (HCC)    PLAN:    In order of problems listed above:  Acute respiratory distress.  I called ambulance she will be transferred to the emergency room for evaluation.  She does not want to go to [facility].  Therefore I will try to get her to [facility], chest x-ray need to be done routine blood test need to be performed try to find etiology of her problem today. Congestive heart failure.  She will require some diuresis, reassessment of left ventricle ejection fraction is to be performed. Paroxysmal supraventricular tachycardia she already took metoprolol today x 2 which is 25 mg each, on top of that she is taking Cardizem.  As needed but she did not take anything today.  There is no point to giving her dizziness since on the EKG I see her switching between sinus rhythm and supraventricular tachycardia she need to be given most likely intravenous IV Cardizem to control this rhythm better.  Over the key will be also try to find etiology of this problem more about potentially having pneumonia/bronchitis  She will look very sick today, she will be transferred to emergency room for evaluation   Medication Adjustments/Labs and Tests Ordered: Current  medicines are reviewed at length with the patient today.  Concerns regarding medicines are outlined above.  Orders Placed This Encounter  Procedures   EKG 12-Lead   Medication changes: No orders of the defined types were placed in this encounter.   Signed, Georgeanna Lea, MD, [facility] 01/09/2023 4:24 PM    [facility]

## 2023-01-09 NOTE — Assessment & Plan Note (Signed)
This is chronic.  However seems to be worse today than recently documented.  Patient has been on chronic steroids for her gout.  I do not clinically find a focus of infection at this time.  Will check blood cultures and monitor clinically.  This is neutrophil predominant

## 2023-01-09 NOTE — H&P (Deleted)
Cardiology Consult  Patient Name: Katie Rasmussen Date of Encounter: 01/09/2023 Primary Care Physician: [name], FNP Cardiologist: Gypsy Balsam, MD  Chief Complaint   Shortness of breath, tachycardia  Patient Profile   52 yo female with history of CAD and prior DES to the LAD in 2019, ICM with LVEF 30-35%, normalized in 2023, now presents to the cardiology office with shortness of breath and tachycardia, send to [facility] ER for CHF/SVT  HPI   This is a 52 y.o. female with a past medical history significant for coronary artery disease with prior drug-eluting stent to the LAD in 2019, ischemic cardiomyopathy with LVEF 30 to 35% that normalized by echo in 2023 followed by Dr. Bing Matter.  She was seen in the office today because of feeling "miserable" for the past week.  According to Dr. Vanetta Shawl note she had reported 6 days of shortness of breath, weakness and tachycardia.  EKG there showed narrow complex tachycardia at 168, read as SVT however appears to be more atrial fibrillation.  Reportedly the patient was given diltiazem and route with improvement in heart rate and her EKG in the emergency department showed a sinus tachycardia at 103.  She denies any chills or fever but does have a history of asthma, obstructive sleep apnea and obesity hypoventilation syndrome.  She was quite dyspneic in the office although was reportedly less dyspneic on arrival. She reports decreased oral intake over the past week with nausea. She has continued to take her diuretics and tells me she has stage 3 CKG. BP in the ER is soft in the 80's/90's systolic. Initial labs show a leukocytosis, but this has been chronic and she is on steroids. She currently denies any chest pain or dyspnea.  PMHx   Past Medical History:  Diagnosis Date   Acute combined systolic and diastolic CHF, NYHA class 3 (HCC)    Acute on chronic respiratory failure with hypoxia and hypercapnia (HCC) 09/14/2017   Ankle  sprain 02/18/2020   Asthma exacerbation 02/18/2020   Bronchitis due to tobacco use 08/2017   Chest pain 02/18/2020   Chronic respiratory failure (HCC)    CKD (chronic kidney disease), stage III (HCC)    COPD (chronic obstructive pulmonary disease) (HCC)    Coronary artery disease    DCM (dilated cardiomyopathy) (HCC)    Diastolic CHF (HCC)    Dyslipidemia 10/08/2017   Dyspnea on exertion 05/07/2018   Elevated troponin    Gout    Hypertension 02/18/2020   Hypothyroidism    h/o Grave's, s/p thyroidectomy & RAI in 1990s   Ischemic cardiomyopathy initial ejection fraction 35 to 40% in March 2019, normalization of ejection fraction based on echocardiogram from 2020 10/08/2017   Ejection fraction 35-40% in March 2019   Leukocytosis 02/18/2020   Obesity hypoventilation syndrome (HCC)    Orthostatic hypotension 02/18/2020   OSA (obstructive sleep apnea) 02/18/2020   Peripheral edema 02/18/2020   Pneumonia 08/2017   Postablative hypothyroidism 04/03/2019   Respiratory acidosis 02/18/2020   Sepsis (HCC) 02/18/2020   Supraventricular tachycardia 07/08/2020   Systolic CHF with reduced left ventricular function, NYHA class 3 (HCC) 09/13/2017   Upper respiratory infection 02/18/2020   Weakness 02/18/2020    Past Surgical History:  Procedure Laterality Date   CESAREAN SECTION     CORONARY STENT INTERVENTION N/A 09/18/2017   Procedure: CORONARY STENT INTERVENTION;  Surgeon: Tonny Bollman, MD;  Location: [facility] INVASIVE CV LAB;  Service: Cardiovascular;  Laterality: N/A;  prox LAD   LEFT  HEART CATH AND CORONARY ANGIOGRAPHY N/A 09/18/2017   Procedure: LEFT HEART CATH AND CORONARY ANGIOGRAPHY;  Surgeon: Tonny Bollman, MD;  Location: [facility] INVASIVE CV LAB;  Service: Cardiovascular;  Laterality: N/A;   THYROID SURGERY     s/p RAI for Grave's    FAMHx   Family History  Problem Relation Age of Onset   Heart disease Mother    Hypertension Mother    Heart disease Father    Diabetes Father     SOCHx     reports that she quit smoking about 5 years ago. Her smoking use included cigarettes. She has a 7.00 pack-year smoking history. She has never used smokeless tobacco. She reports that she does not drink alcohol and does not use drugs.  Outpatient Medications   Current Facility-Administered Medications on File Prior to Encounter  Medication Dose Route Frequency Provider Last Rate Last Admin   triamcinolone acetonide (KENALOG) 10 MG/ML injection 10 mg  10 mg Other Once Asencion Islam, DPM       Current Outpatient Medications on File Prior to Encounter  Medication Sig Dispense Refill   acetaminophen (TYLENOL) 500 MG tablet Take 500 mg by mouth every 6 (six) hours as needed for mild pain or moderate pain.     albuterol (PROVENTIL) (2.5 MG/3ML) 0.083% nebulizer solution Take 3 mLs (2.5 mg total) by nebulization every 6 (six) hours as needed for wheezing or shortness of breath. 75 mL 12   allopurinol (ZYLOPRIM) 100 MG tablet Take 1 tablet (100 mg total) by mouth daily. 30 tablet 6   ALPRAZolam (XANAX) 0.25 MG tablet Take 1 tablet (0.25 mg total) by mouth at bedtime as needed for anxiety. 10 tablet 0   amoxicillin-clavulanate (AUGMENTIN) 875-125 MG tablet Take 1 tablet by mouth 2 (two) times daily.     aspirin EC 81 MG EC tablet Take 1 tablet (81 mg total) by mouth daily.     atorvastatin (LIPITOR) 40 MG tablet Take 1 tablet (40 mg total) by mouth daily. 90 tablet 3   clopidogrel (PLAVIX) 75 MG tablet TAKE 1 TABLET BY MOUTH ONCE DAILY WITH BREAKFAST. (Patient taking differently: Take 75 mg by mouth daily.) 90 tablet 3   colchicine 0.6 MG tablet Take 1 tablet (0.6 mg total) by mouth 2 (two) times daily. 20 tablet 0   colchicine 0.6 MG tablet Take 1 tablet (0.6 mg total) by mouth daily for 7 days. 7 tablet 0   cyanocobalamin (,VITAMIN B-12,) 1000 MCG/ML injection INJECT SUBQ ONCE MONTHLY. (Patient taking differently: Inject 100 mcg into the muscle every 30 (thirty) days.) 3 mL 4   diltiazem (CARDIZEM  CD) 180 MG 24 hr capsule TAKE ONE CAPSULE BY MOUTH EVERY DAY 90 capsule 0   diltiazem (CARDIZEM) 30 MG tablet Take 1 tablet (30 mg total) by mouth every 6 (six) hours as needed FOR PALPITATIONS (Patient taking differently: Take 30 mg by mouth every 6 (six) hours as needed (Palpitations).) 90 tablet 3   ezetimibe (ZETIA) 10 MG tablet Take 1 tablet (10 mg total) by mouth daily. 30 tablet 3   fluticasone furoate-vilanterol (BREO ELLIPTA) 200-25 MCG/INH AEPB Inhale 1 puff into the lungs daily. 1 each 11   levothyroxine (SYNTHROID) 137 MCG tablet TAKE 1 TABLET BY MOUTH EVERY MORNING ON AN EMPTY STOMACH. (Patient taking differently: Take 137 mcg by mouth daily before breakfast.) 90 tablet 2   methylPREDNISolone (MEDROL DOSEPAK) 4 MG TBPK tablet Take as directed for 6 days 1 each 0   metolazone (ZAROXOLYN) 2.5  MG tablet Take 1 tablet (2.5 mg total) by mouth 2 (two) times a week. 8 tablet 3   metoprolol succinate (TOPROL-XL) 25 MG 24 hr tablet TAKE 1 TABLET BY MOUTH ONCE DAILY. 90 tablet 3   metoprolol tartrate (LOPRESSOR) 25 MG tablet Take 0.5 tablets (12.5 mg total) by mouth every 8 (eight) hours as needed. (Patient taking differently: Take 12.5 mg by mouth every 8 (eight) hours as needed (elevated HR).) 180 tablet 3   potassium chloride (KLOR-CON) 10 MEQ tablet Take 1 tablet (10 mEq total) by mouth daily. 30 tablet 2   predniSONE (DELTASONE) 10 MG tablet Take 10 mg by mouth daily with breakfast.     SPIRIVA RESPIMAT 1.25 MCG/ACT AERS Inhale 1 each into the lungs daily.     torsemide (DEMADEX) 20 MG tablet Take 1 tablet (20 mg total) by mouth 2 (two) times daily. 180 tablet 2   VENTOLIN HFA 108 (90 Base) MCG/ACT inhaler INHALE 2 PUFFS BY MOUTH EVERY 4 HOURS ASNEEDED FOR SHORTNESS OF BREATH. (Patient taking differently: Inhale 2 puffs into the lungs every 4 (four) hours as needed for wheezing or shortness of breath. INHALE 2 PUFFS BY MOUTH EVERY 4 HOURS ASNEEDED FOR SHORTNESS OF BREATH.) 18 g 0   Vitamin D,  Ergocalciferol, (DRISDOL) 1.25 MG (50000 UNIT) CAPS capsule Take 50,000 Units by mouth once a week.      Inpatient Medications    Scheduled Meds:  triamcinolone acetonide  10 mg Other Once    Continuous Infusions:   PRN Meds:    ALLERGIES   Allergies  Allergen Reactions   Enoxaparin Sodium Other (See Comments)   Iodine Hives and Itching   Lovenox [Enoxaparin]    No Known Allergies Hives   Sulfa Antibiotics Other (See Comments)   Shellfish Allergy Hives and Itching    ROS   Pertinent items noted in HPI and remainder of comprehensive ROS otherwise negative.  Vitals   Vitals:   01/09/23 1840 01/09/23 1843 01/09/23 1845 01/09/23 1900  BP:   (!) 98/53 (!) 92/42  Pulse: (!) 143 (!) 139 (!) 106 100  Resp: (!) 25 17 19 18   Temp:      TempSrc:      SpO2: 100% 100% 99% 100%  Weight:      Height:       No intake or output data in the 24 hours ending 01/09/23 1906 Filed Weights   01/09/23 1755  Weight: 124.7 kg    Physical Exam   General appearance: alert, no distress, morbidly obese, and pale Neck: no carotid bruit, no JVD, and thyroid not enlarged, symmetric, no tenderness/mass/nodules Lungs: diminished breath sounds bibasilar Heart: regular rate and rhythm Abdomen: soft, non-tender; bowel sounds normal; no masses,  no organomegaly and obese Extremities: extremities normal, atraumatic, no cyanosis or edema Pulses: 2+ and symmetric Skin: Skin color, texture, turgor normal. No rashes or lesions Neurologic: Grossly normal Psych: Tearful, anxious  Labs   Results for orders placed or performed during the hospital encounter of 01/09/23 (from the past 48 hour(s))  CBC with Differential     Status: Abnormal   Collection Time: 01/09/23  5:55 PM  Result Value Ref Range   WBC 16.8 (H) 4.0 - 10.5 K/uL   RBC 4.49 3.87 - 5.11 MIL/uL   Hemoglobin 13.4 12.0 - 15.0 g/dL   HCT 16.1 09.6 - 04.5 %   MCV 91.5 80.0 - 100.0 fL   MCH 29.8 26.0 - 34.0 pg   MCHC 32.6 30.0 -  36.0 g/dL   RDW 62.1 30.8 - 65.7 %   Platelets 352 150 - 400 K/uL   nRBC 0.0 0.0 - 0.2 %   Neutrophils Relative % 78 %   Neutro Abs 13.0 (H) 1.7 - 7.7 K/uL   Lymphocytes Relative 14 %   Lymphs Abs 2.4 0.7 - 4.0 K/uL   Monocytes Relative 5 %   Monocytes Absolute 0.9 0.1 - 1.0 K/uL   Eosinophils Relative 2 %   Eosinophils Absolute 0.4 0.0 - 0.5 K/uL   Basophils Relative 0 %   Basophils Absolute 0.1 0.0 - 0.1 K/uL   Immature Granulocytes 1 %   Abs Immature Granulocytes 0.12 (H) 0.00 - 0.07 K/uL    Comment: Performed at [facility], [address] [place], [place] [address]    ECG   Atrial fibrillation at 168- Personally Reviewed  Telemetry   Sinus tachycardia at 103- Personally Reviewed  Radiology   DG Chest Portable 1 View  Result Date: 01/09/2023 CLINICAL DATA:  Shortness of EXAM: PORTABLE CHEST 1 VIEW COMPARISON:  01/02/2023, 10/06/2022, CT 08/31/2022 FINDINGS: Prominent epicardial pericardial fat. Minimal atelectasis right base. Stable cardiomediastinal silhouette. No pneumothorax IMPRESSION: Minimal right base atelectasis. Electronically Signed   By: Jasmine Pang M.D.   On: 01/09/2023 18:42    Cardiac Studies   Pending  Assessment   Principal Problem:   New onset atrial fibrillation [facility]) Active Problems:   Coronary artery disease drug-eluting stent implanted to proximal LAD in March 2019 in face of acute myocardial infarction   Leukocytosis   Plan   New onset atrial fibrillation EKG personally reviewed and is irregularly irregular and tachycardic, consistent with new onset atrial fibrillation. She has multiple risk factors for this. Fortunately, she converted back to sinus rhythm in the ER. She now feels much better and I suspect her symptoms were from rapid Afib. BP is currently soft - with little oral intake and continued diuretics, I suspect she is dry - exam is not consistent with volume overload. Will give 500 mL back now - awaiting labs. Will  ultimately need another anti-arryhtmic strategy although options are limited (has pulmonary fibrosis - so would avoid amio, history of CAD and CHF - hypotension may be a limiting factor, CKD3 - would avoid digoxin, ?dofetilide.  Repeat echo. Stop Plavix and switch to Eliquis 5 mg BID. CAD No reports of chest pain - really dyspnea/tachycardia, which has resolved. Troponins pending. Continue current CAD medications except Plavix, will switch to Eliquis. Chronic combined systolic and diastolic heart failure LVEF had normalized as of 03/2022 - repeat echo is pending. Does not appear overtly volume-overloaded on exam. Hypotensive, will give volume- await labs (BNP, metabolic, etc). Adjust home diuretics as necessary. OHS/OSA Continue CPAP or BIPAP.  Continue spiriva and ventolin inhalers Gout Apparently on chronic steroid therapy for this - would continue HLD On atorvastatin and ezetimibe - repeat lipid profile in am, goal LDL<70 Leukocytosis Suspect chronic d/t steroids + stress reaction from afib, no fever or other s/s of infection  Time Spent Directly with Patient:  I have spent a total of 45 minutes with patient reviewing hospital notes, telemetry, EKGs, labs and examining the patient as well as establishing an assessment and plan that was discussed with the patient.  > 50% of time was spent in direct patient care.   Length of Stay:  LOS: 0 days   Chrystie Nose, MD, [facility], FACP  [place]  [facility]  Medical Director of the  [facility] Lipid [facility]  Card[facility] Diplomate of the American Board of Clinical Lipidology Attending Cardiologist  Direct Dial: [phone]  Fax: [phone]  Website:  [email or web address] Nicely Jerald Villalona 01/09/2023, 7:06 PM

## 2023-01-09 NOTE — ED Notes (Signed)
Pt urinated in brief. Writer changed brief/linens and performed peri care on patient.

## 2023-01-09 NOTE — ED Notes (Signed)
ED TO INPATIENT HANDOFF REPORT  ED Nurse Name and Phone #: Merry Lofty [phone]  S Name/Age/Gender Katie Rasmussen 52 y.o. female Room/Bed: 027C/027C  Code Status   Code Status: Full Code  Home/SNF/Other Home Patient oriented to: self, place, time, and situation Is this baseline? Yes   Triage Complete: Triage complete  Chief Complaint A-fib [facility]) [I48.91]  Triage Note No notes on file   Allergies Allergies  Allergen Reactions   Enoxaparin Sodium Other (See Comments)   Iodine Hives and Itching   Lovenox [Enoxaparin]    No Known Allergies Hives   Sulfa Antibiotics Other (See Comments)   Shellfish Allergy Hives and Itching    Level of Care/Admitting Diagnosis ED Disposition     ED Disposition  Admit   Condition  --   Comment  Hospital Area: [facility] [[id number]]  Level of Care: Telemetry Cardiac [[id number]]  May admit patient to [facility] or [facility] if equivalent level of care is available:: No  Covid Evaluation: Asymptomatic - no recent exposure (last 10 days) testing not required  Diagnosis: A-fib [facility]) [[id number]]  Admitting Physician: Nolberto Hanlon [[id number]]  Attending Physician: Nolberto Hanlon [[id number]]  Certification:: I certify this patient will need inpatient services for at least 2 midnights  Estimated Length of Stay: 3          B Medical/Surgery History Past Medical History:  Diagnosis Date   Acute combined systolic and diastolic CHF, NYHA class 3 (HCC)    Acute on chronic respiratory failure with hypoxia and hypercapnia (HCC) 09/14/2017   Ankle sprain 02/18/2020   Asthma exacerbation 02/18/2020   Bronchitis due to tobacco use 08/2017   Chest pain 02/18/2020   Chronic respiratory failure (HCC)    CKD (chronic kidney disease), stage III (HCC)    COPD (chronic obstructive pulmonary disease) (HCC)    Coronary artery disease    DCM (dilated cardiomyopathy) (HCC)    Diastolic CHF (HCC)    Dyslipidemia 10/08/2017   Dyspnea on  exertion 05/07/2018   Elevated troponin    Gout    Hypertension 02/18/2020   Hypothyroidism    h/o Grave's, s/p thyroidectomy & RAI in 1990s   Ischemic cardiomyopathy initial ejection fraction 35 to 40% in March 2019, normalization of ejection fraction based on echocardiogram from 2020 10/08/2017   Ejection fraction 35-40% in March 2019   Leukocytosis 02/18/2020   Obesity hypoventilation syndrome (HCC)    Orthostatic hypotension 02/18/2020   OSA (obstructive sleep apnea) 02/18/2020   Peripheral edema 02/18/2020   Pneumonia 08/2017   Postablative hypothyroidism 04/03/2019   Respiratory acidosis 02/18/2020   Sepsis (HCC) 02/18/2020   Supraventricular tachycardia 07/08/2020   Systolic CHF with reduced left ventricular function, NYHA class 3 (HCC) 09/13/2017   Upper respiratory infection 02/18/2020   Weakness 02/18/2020   Past Surgical History:  Procedure Laterality Date   CESAREAN SECTION     CORONARY STENT INTERVENTION N/A 09/18/2017   Procedure: CORONARY STENT INTERVENTION;  Surgeon: Tonny Bollman, MD;  Location: [facility] INVASIVE CV LAB;  Service: Cardiovascular;  Laterality: N/A;  prox LAD   LEFT HEART CATH AND CORONARY ANGIOGRAPHY N/A 09/18/2017   Procedure: LEFT HEART CATH AND CORONARY ANGIOGRAPHY;  Surgeon: Tonny Bollman, MD;  [facility];  Service: Cardiovascular;  Laterality: N/A;   THYROID SURGERY     s/p RAI for Grave's     A IV Location/Drains/Wounds Patient Lines/Drains/Airways Status     Active Line/Drains/Airways     Name Placement date  Placement time Site Days   Peripheral IV 01/09/23 18 G Right Antecubital 01/09/23  1757  Antecubital  less than 1   External Urinary Catheter 09/19/17  2055  --  1938            Intake/Output Last 24 hours  Intake/Output Summary (Last 24 hours) at 01/09/2023 2359 Last data filed at 01/09/2023 2049 Gross per 24 hour  Intake 500 ml  Output --  Net 500 ml    Labs/Imaging Results for orders placed or performed during  the hospital encounter of 01/09/23 (from the past 48 hour(s))  CBC with Differential     Status: Abnormal   Collection Time: 01/09/23  5:55 PM  Result Value Ref Range   WBC 16.8 (H) 4.0 - 10.5 K/uL   RBC 4.49 3.87 - 5.11 MIL/uL   Hemoglobin 13.4 12.0 - 15.0 g/dL   HCT 16.1 09.6 - 04.5 %   MCV 91.5 80.0 - 100.0 fL   MCH 29.8 26.0 - 34.0 pg   MCHC 32.6 30.0 - 36.0 g/dL   RDW 40.9 81.1 - 91.4 %   Platelets 352 150 - 400 K/uL   nRBC 0.0 0.0 - 0.2 %   Neutrophils Relative % 78 %   Neutro Abs 13.0 (H) 1.7 - 7.7 K/uL   Lymphocytes Relative 14 %   Lymphs Abs 2.4 0.7 - 4.0 K/uL   Monocytes Relative 5 %   Monocytes Absolute 0.9 0.1 - 1.0 K/uL   Eosinophils Relative 2 %   Eosinophils Absolute 0.4 0.0 - 0.5 K/uL   Basophils Relative 0 %   Basophils Absolute 0.1 0.0 - 0.1 K/uL   Immature Granulocytes 1 %   Abs Immature Granulocytes 0.12 (H) 0.00 - 0.07 K/uL    Comment: Performed at [facility], [address], [place], [place] [address]  Comprehensive metabolic panel     Status: Abnormal   Collection Time: 01/09/23  5:55 PM  Result Value Ref Range   Sodium 135 135 - 145 mmol/L   Potassium 3.0 (L) 3.5 - 5.1 mmol/L   Chloride 94 (L) 98 - 111 mmol/L   CO2 25 22 - 32 mmol/L   Glucose, Bld 161 (H) 70 - 99 mg/dL    Comment: Glucose reference range applies only to samples taken after fasting for at least 8 hours.   BUN 11 6 - 20 mg/dL   Creatinine, Ser 6.21 (H) 0.44 - 1.00 mg/dL   Calcium 9.1 8.9 - 30.8 mg/dL   Total Protein 6.5 6.5 - 8.1 g/dL   Albumin 3.3 (L) 3.5 - 5.0 g/dL   AST 28 15 - 41 U/L   ALT 25 0 - 44 U/L   Alkaline Phosphatase 102 38 - 126 U/L   Total Bilirubin 0.6 0.3 - 1.2 mg/dL   GFR, Estimated 42 (L) >60 mL/min    Comment: (NOTE) Calculated using the CKD-EPI Creatinine Equation (2021)    Anion gap 16 (H) 5 - 15    Comment: Performed at [facility], [address] [place], [place] [address]  Troponin I (High Sensitivity)     Status: None   Collection  Time: 01/09/23  5:55 PM  Result Value Ref Range   Troponin I (High Sensitivity) 14 <18 ng/L    Comment: (NOTE) Elevated high sensitivity troponin I (hsTnI) values and significant  changes across serial measurements may suggest ACS but many other  chronic and acute conditions are known to elevate hsTnI results.  Refer to  the "Links" section for chest pain algorithms and additional  guidance. Performed at [facility], [address], [place], [place] [address]   T4, free     Status: None   Collection Time: 01/09/23  5:55 PM  Result Value Ref Range   Free T4 0.86 0.61 - 1.12 ng/dL    Comment: (NOTE) Biotin ingestion may interfere with free T4 tests. If the results are inconsistent with the TSH level, previous test results, or the clinical presentation, then consider biotin interference. If needed, order repeat testing after stopping biotin. Performed at [facility], [address], [place], [place] [address]   Resp panel by RT-PCR (RSV, Flu A&B, Covid) Anterior Nasal Swab     Status: None   Collection Time: 01/09/23  6:16 PM   Specimen: Anterior Nasal Swab  Result Value Ref Range   SARS Coronavirus 2 by RT PCR NEGATIVE NEGATIVE   Influenza A by PCR NEGATIVE NEGATIVE   Influenza B by PCR NEGATIVE NEGATIVE    Comment: (NOTE) The Xpert Xpress SARS-CoV-2/FLU/RSV plus assay is intended as an aid in the diagnosis of influenza from Nasopharyngeal swab specimens and should not be used as a sole basis for treatment. Nasal washings and aspirates are unacceptable for Xpert Xpress SARS-CoV-2/FLU/RSV testing.  Fact Sheet for Patients: BloggerCourse.com  Fact Sheet for Healthcare Providers: SeriousBroker.it  This test is not yet approved or cleared by the [place] FDA and has been authorized for detection and/or diagnosis of SARS-CoV-2 by FDA under an Emergency Use Authorization (EUA). This EUA will remain in effect  (meaning this test can be used) for the duration of the COVID-19 declaration under Section 564(b)(1) of the Act, 21 U.S.C. section 360bbb-3(b)(1), unless the authorization is terminated or revoked.     Resp Syncytial Virus by PCR NEGATIVE NEGATIVE    Comment: (NOTE) Fact Sheet for Patients: BloggerCourse.com  Fact Sheet for Healthcare Providers: SeriousBroker.it  This test is not yet approved or cleared by the [place] FDA and has been authorized for detection and/or diagnosis of SARS-CoV-2 by FDA under an Emergency Use Authorization (EUA). This EUA will remain in effect (meaning this test can be used) for the duration of the COVID-19 declaration under Section 564(b)(1) of the Act, 21 U.S.C. section 360bbb-3(b)(1), unless the authorization is terminated or revoked.  Performed at [facility], [address], [place], [place] [address]   Brain natriuretic peptide     Status: None   Collection Time: 01/09/23  6:16 PM  Result Value Ref Range   B Natriuretic Peptide 86.3 0.0 - 100.0 pg/mL    Comment: Performed at [facility], [address] [place], [place] [address]  TSH     Status: Abnormal   Collection Time: 01/09/23  6:16 PM  Result Value Ref Range   TSH 16.352 (H) 0.350 - 4.500 uIU/mL    Comment: Performed by a 3rd Generation assay with a functional sensitivity of <=0.01 uIU/mL. Performed at [facility], [address], [place], [place] [address]   Troponin I (High Sensitivity)     Status: None   Collection Time: 01/09/23  7:57 PM  Result Value Ref Range   Troponin I (High Sensitivity) 17 <18 ng/L    Comment: (NOTE) Elevated high sensitivity troponin I (hsTnI) values and significant  changes across serial measurements may suggest ACS but many other  chronic and acute conditions are known to elevate hsTnI results.  Refer to the "Links" section  for chest pain algorithms and additional   guidance. Performed at [facility], [address] [place], [place] [address]    DG Chest Portable 1 View  Result Date: 01/09/2023 CLINICAL DATA:  Shortness of EXAM: PORTABLE CHEST 1 VIEW COMPARISON:  01/02/2023, 10/06/2022, CT 08/31/2022 FINDINGS: Prominent epicardial pericardial fat. Minimal atelectasis right base. Stable cardiomediastinal silhouette. No pneumothorax IMPRESSION: Minimal right base atelectasis. Electronically Signed   By: Jasmine Pang M.D.   On: 01/09/2023 18:42    Pending Labs Unresulted Labs (From admission, onward)     Start     Ordered   01/09/23 2322  Magnesium  Add-on,   AD        01/09/23 2321   01/09/23 2310  Culture, blood (Routine X 2) w Reflex to ID Panel  BLOOD CULTURE X 2,   R (with STAT occurrences)      01/09/23 2309   Signed and Held  HIV Antibody (routine testing w rflx)  (HIV Antibody (Routine testing w reflex) panel)  Once,   R        Signed and Held   Signed and Held  APTT  ONCE - URGENT,   R        Signed and Held   Signed and Held  Protime-INR  ONCE - URGENT,   R        Signed and Held   Signed and Held  Basic metabolic panel  Tomorrow morning,   R        Signed and Held   Signed and Held  CBC  Tomorrow morning,   R        Signed and Held            Vitals/Pain Today's Vitals   01/09/23 2230 01/09/23 2300 01/09/23 2330 01/09/23 2345  BP: 112/71 122/67 123/68   Pulse: 98 89 96   Resp: (!) 22 20  (!) 27  Temp:      TempSrc:      SpO2: 100% 100% 100%   Weight:      Height:        Isolation Precautions No active isolations  Medications Medications  apixaban (ELIQUIS) tablet 5 mg (has no administration in time range)  potassium chloride (KLOR-CON) packet 40 mEq (has no administration in time range)  lactated ringers bolus 500 mL (0 mLs Intravenous Stopped 01/09/23 2049)  potassium chloride SA (KLOR-CON M) CR tablet 40 mEq (40 mEq Oral Given 01/09/23 2218)    Mobility manual wheelchair     Focused  Assessments Cardiac Assessment Handoff:  Cardiac Rhythm: (S) Normal sinus rhythm, Atrial fibrillation Lab Results  Component Value Date   TROPONINI 0.18 (HH) 09/15/2017   No results found for: "DDIMER" Does the Patient currently have chest pain? No    R Recommendations: See Admitting Provider Note  Report given to:   Additional Notes: Pt came from home where her husband helps take care of her after having several days of centralized chest pain/SOB. Hx of CHF. Pt recently admitted for new onset Afib and started on oral cardizem. EMS reports patient's HR 160s, gave 20mg  IV cardizem, patient converted to NSR. Pt has gone back into afib 140s since then multiple times and has converted on her own after using vagal maneuvers. No CP at this time, just reports tightness. Mild SOB at baseline with SOB on exertion. Patient hypotensive on arrival, given LR bolus. BP maintaining 110s over 60s now. Plan for Echo tomorrow as well as  get medications set up to prevent patient continuing to go into afib/RVR. Pt receiving oral potassium for K of 3.0. WBC is 16.8. 1 out of 2 Blood cultures drawn.

## 2023-01-09 NOTE — H&P (Signed)
History and Physical    Patient: Katie Rasmussen ZOX:[id number] DOB: October 29, 1970 DOA: 01/09/2023 DOS: the patient was seen and examined on 01/09/2023 PCP: [name], FNP  Patient coming from: Home  Chief Complaint:  Chief Complaint  Patient presents with   Chest Pain    CP and SOB x 6 days. Hx of CHF Recent hospital visit for afib and placed on PO Cardizem. EMS reports HR 160s to 180s. Pt was given 20mg  iv push cardizem and pts HR now low 100s. Pt reports was SOB and having CP prior to being converted. Pt reports upon arrival just being fatigued.    HPI: Katie Rasmussen is a 52 y.o. female with medical history significant of congestive heart failure, prior diagnosis of pulmonary fibrosis as well.  Patient reports that chronically for the last 2 years she has had intermittent episodes of her heart racing suddenly associated with some sensation of shortness of breath.  These will typically resolve when the patient would bear down.  And very infrequent.  About once every several weeks.  However for the last 5 days they have been extremely frequent and are not resolving with the patient's attempts to bear down.  There is no chest pain no loss of consciousness no presyncope no worsening leg swelling no vomiting or diarrhea.  Patient came to the hospital due to these recurrent more persistent episodes of palpitations which are like her heart racing associated with sensation of shortness of breath which are still self resolving.  Patient has no symptoms when she is not having these episodes.  ER course is notable for patient receiving diltiazem and route to the ER.  In spite of this patient's heart rate would occasionally go up to 140/min per report in the narrow complex morphology.  Currently these episodes have resolved and patient is asymptomatic Review of Systems: As mentioned in the history of present illness. All other systems reviewed and are negative. Past Medical  History:  Diagnosis Date   Acute combined systolic and diastolic CHF, NYHA class 3 (HCC)    Acute on chronic respiratory failure with hypoxia and hypercapnia (HCC) 09/14/2017   Ankle sprain 02/18/2020   Asthma exacerbation 02/18/2020   Bronchitis due to tobacco use 08/2017   Chest pain 02/18/2020   Chronic respiratory failure (HCC)    CKD (chronic kidney disease), stage III (HCC)    COPD (chronic obstructive pulmonary disease) (HCC)    Coronary artery disease    DCM (dilated cardiomyopathy) (HCC)    Diastolic CHF (HCC)    Dyslipidemia 10/08/2017   Dyspnea on exertion 05/07/2018   Elevated troponin    Gout    Hypertension 02/18/2020   Hypothyroidism    h/o Grave's, s/p thyroidectomy & RAI in 1990s   Ischemic cardiomyopathy initial ejection fraction 35 to 40% in March 2019, normalization of ejection fraction based on echocardiogram from 2020 10/08/2017   Ejection fraction 35-40% in March 2019   Leukocytosis 02/18/2020   Obesity hypoventilation syndrome (HCC)    Orthostatic hypotension 02/18/2020   OSA (obstructive sleep apnea) 02/18/2020   Peripheral edema 02/18/2020   Pneumonia 08/2017   Postablative hypothyroidism 04/03/2019   Respiratory acidosis 02/18/2020   Sepsis (HCC) 02/18/2020   Supraventricular tachycardia 07/08/2020   Systolic CHF with reduced left ventricular function, NYHA class 3 (HCC) 09/13/2017   Upper respiratory infection 02/18/2020   Weakness 02/18/2020   Past Surgical History:  Procedure Laterality Date   CESAREAN SECTION     CORONARY STENT INTERVENTION N/A  09/18/2017   Procedure: CORONARY STENT INTERVENTION;  Surgeon: Tonny Bollman, MD;  Location: [facility] INVASIVE CV LAB;  Service: Cardiovascular;  Laterality: N/A;  prox LAD   LEFT HEART CATH AND CORONARY ANGIOGRAPHY N/A 09/18/2017   Procedure: LEFT HEART CATH AND CORONARY ANGIOGRAPHY;  Surgeon: Tonny Bollman, MD;  Location: [facility] INVASIVE CV LAB;  Service: Cardiovascular;  Laterality: N/A;   THYROID SURGERY     s/p RAI for  Grave's   Social History:  reports that she quit smoking about 5 years ago. Her smoking use included cigarettes. She has a 7.00 pack-year smoking history. She has never used smokeless tobacco. She reports that she does not drink alcohol and does not use drugs.  Allergies  Allergen Reactions   Enoxaparin Sodium Other (See Comments)   Iodine Hives and Itching   Lovenox [Enoxaparin]    No Known Allergies Hives   Sulfa Antibiotics Other (See Comments)   Shellfish Allergy Hives and Itching    Family History  Problem Relation Age of Onset   Heart disease Mother    Hypertension Mother    Heart disease Father    Diabetes Father     Prior to Admission medications   Medication Sig Start Date End Date Taking? Authorizing Provider  acetaminophen (TYLENOL) 500 MG tablet Take 500 mg by mouth every 6 (six) hours as needed for mild pain or moderate pain.    [provider]  albuterol (PROVENTIL) (2.5 MG/3ML) 0.083% nebulizer solution Take 3 mLs (2.5 mg total) by nebulization every 6 (six) hours as needed for wheezing or shortness of breath. 03/11/19   Steffanie Dunn, DO  allopurinol (ZYLOPRIM) 100 MG tablet Take 1 tablet (100 mg total) by mouth daily. 10/25/21   Asencion Islam, DPM  ALPRAZolam Prudy Feeler) 0.25 MG tablet Take 1 tablet (0.25 mg total) by mouth at bedtime as needed for anxiety. 07/15/20   Georgeanna Lea, MD  amoxicillin-clavulanate (AUGMENTIN) 875-125 MG tablet Take 1 tablet by mouth 2 (two) times daily. 08/04/21   [provider]  aspirin EC 81 MG EC tablet Take 1 tablet (81 mg total) by mouth daily. 09/21/17   Rhetta Mura, MD  atorvastatin (LIPITOR) 40 MG tablet Take 1 tablet (40 mg total) by mouth daily. 03/01/21   Georgeanna Lea, MD  clopidogrel (PLAVIX) 75 MG tablet TAKE 1 TABLET BY MOUTH ONCE DAILY WITH BREAKFAST. Patient taking differently: Take 75 mg by mouth daily. 02/28/22   Georgeanna Lea, MD  colchicine 0.6 MG tablet Take 1 tablet (0.6 mg  total) by mouth 2 (two) times daily. 10/25/21   Asencion Islam, DPM  colchicine 0.6 MG tablet Take 1 tablet (0.6 mg total) by mouth daily for 7 days. 11/21/22 11/28/22  Standiford, Jenelle Mages, DPM  cyanocobalamin (,VITAMIN B-12,) 1000 MCG/ML injection INJECT SUBQ ONCE MONTHLY. Patient taking differently: Inject 100 mcg into the muscle every 30 (thirty) days. 07/19/20   Pascal Lux, NP  diltiazem (CARDIZEM CD) 180 MG 24 hr capsule TAKE ONE CAPSULE BY MOUTH EVERY DAY 02/27/22   Georgeanna Lea, MD  diltiazem (CARDIZEM) 30 MG tablet Take 1 tablet (30 mg total) by mouth every 6 (six) hours as needed FOR PALPITATIONS Patient taking differently: Take 30 mg by mouth every 6 (six) hours as needed (Palpitations). 09/22/22   Georgeanna Lea, MD  ezetimibe (ZETIA) 10 MG tablet Take 1 tablet (10 mg total) by mouth daily. 06/15/21   Reather Littler, MD  fluticasone furoate-vilanterol (BREO ELLIPTA) 200-25 MCG/INH AEPB Inhale  1 puff into the lungs daily. 03/11/19   Steffanie Dunn, DO  levothyroxine (SYNTHROID) 137 MCG tablet TAKE 1 TABLET BY MOUTH EVERY MORNING ON AN EMPTY STOMACH. Patient taking differently: Take 137 mcg by mouth daily before breakfast. 11/21/21   Reather Littler, MD  methylPREDNISolone (MEDROL DOSEPAK) 4 MG TBPK tablet Take as directed for 6 days 11/21/22   Standiford, Jenelle Mages, DPM  metolazone (ZAROXOLYN) 2.5 MG tablet Take 1 tablet (2.5 mg total) by mouth 2 (two) times a week. 10/19/22 01/17/23  Georgeanna Lea, MD  metoprolol succinate (TOPROL-XL) 25 MG 24 hr tablet TAKE 1 TABLET BY MOUTH ONCE DAILY. 02/28/22   Georgeanna Lea, MD  metoprolol tartrate (LOPRESSOR) 25 MG tablet Take 0.5 tablets (12.5 mg total) by mouth every 8 (eight) hours as needed. Patient taking differently: Take 12.5 mg by mouth every 8 (eight) hours as needed (elevated HR). 08/23/22   Georgeanna Lea, MD  potassium chloride (KLOR-CON) 10 MEQ tablet Take 1 tablet (10 mEq total) by mouth daily. 10/18/22 01/16/23   Georgeanna Lea, MD  predniSONE (DELTASONE) 10 MG tablet Take 10 mg by mouth daily with breakfast. 08/04/21   [provider]  SPIRIVA RESPIMAT 1.25 MCG/ACT AERS Inhale 1 each into the lungs daily. 10/07/21   [provider]  torsemide (DEMADEX) 20 MG tablet Take 1 tablet (20 mg total) by mouth 2 (two) times daily. 05/19/22   Georgeanna Lea, MD  VENTOLIN HFA 108 (90 Base) MCG/ACT inhaler INHALE 2 PUFFS BY MOUTH EVERY 4 HOURS ASNEEDED FOR SHORTNESS OF BREATH. Patient taking differently: Inhale 2 puffs into the lungs every 4 (four) hours as needed for wheezing or shortness of breath. INHALE 2 PUFFS BY MOUTH EVERY 4 HOURS ASNEEDED FOR SHORTNESS OF BREATH. 12/09/21   Georgeanna Lea, MD  Vitamin D, Ergocalciferol, (DRISDOL) 1.25 MG (50000 UNIT) CAPS capsule Take 50,000 Units by mouth once a week. 09/08/20   [provider]    Physical Exam: Vitals:   01/09/23 2200 01/09/23 2215 01/09/23 2230 01/09/23 2300  BP: 127/72  112/71 122/67  Pulse: (!) 145 (!) 101 98 89  Resp: (!) 32 17 (!) 22 20  Temp:      TempSrc:      SpO2: 98% 100% 100% 100%  Weight:      Height:       General: No distress, obese Respiratory exam: Bilateral air entry vesicular Cardiovascular exam: S1-S2 normal Abdomen all quadrants soft nontender Extremities warm without edema. Data Reviewed:  Labs on Admission:  Results for orders placed or performed during the hospital encounter of 01/09/23 (from the past 24 hour(s))  CBC with Differential     Status: Abnormal   Collection Time: 01/09/23  5:55 PM  Result Value Ref Range   WBC 16.8 (H) 4.0 - 10.5 K/uL   RBC 4.49 3.87 - 5.11 MIL/uL   Hemoglobin 13.4 12.0 - 15.0 g/dL   HCT 38.7 56.4 - 33.2 %   MCV 91.5 80.0 - 100.0 fL   MCH 29.8 26.0 - 34.0 pg   MCHC 32.6 30.0 - 36.0 g/dL   RDW 95.1 88.4 - 16.6 %   Platelets 352 150 - 400 K/uL   nRBC 0.0 0.0 - 0.2 %   Neutrophils Relative % 78 %   Neutro Abs 13.0 (H) 1.7 - 7.7 K/uL   Lymphocytes  Relative 14 %   Lymphs Abs 2.4 0.7 - 4.0 K/uL   Monocytes Relative 5 %   Monocytes Absolute  0.9 0.1 - 1.0 K/uL   Eosinophils Relative 2 %   Eosinophils Absolute 0.4 0.0 - 0.5 K/uL   Basophils Relative 0 %   Basophils Absolute 0.1 0.0 - 0.1 K/uL   Immature Granulocytes 1 %   Abs Immature Granulocytes 0.12 (H) 0.00 - 0.07 K/uL  Comprehensive metabolic panel     Status: Abnormal   Collection Time: 01/09/23  5:55 PM  Result Value Ref Range   Sodium 135 135 - 145 mmol/L   Potassium 3.0 (L) 3.5 - 5.1 mmol/L   Chloride 94 (L) 98 - 111 mmol/L   CO2 25 22 - 32 mmol/L   Glucose, Bld 161 (H) 70 - 99 mg/dL   BUN 11 6 - 20 mg/dL   Creatinine, Ser 1.61 (H) 0.44 - 1.00 mg/dL   Calcium 9.1 8.9 - 09.6 mg/dL   Total Protein 6.5 6.5 - 8.1 g/dL   Albumin 3.3 (L) 3.5 - 5.0 g/dL   AST 28 15 - 41 U/L   ALT 25 0 - 44 U/L   Alkaline Phosphatase 102 38 - 126 U/L   Total Bilirubin 0.6 0.3 - 1.2 mg/dL   GFR, Estimated 42 (L) >60 mL/min   Anion gap 16 (H) 5 - 15  Troponin I (High Sensitivity)     Status: None   Collection Time: 01/09/23  5:55 PM  Result Value Ref Range   Troponin I (High Sensitivity) 14 <18 ng/L  T4, free     Status: None   Collection Time: 01/09/23  5:55 PM  Result Value Ref Range   Free T4 0.86 0.61 - 1.12 ng/dL  Resp panel by RT-PCR (RSV, Flu A&B, Covid) Anterior Nasal Swab     Status: None   Collection Time: 01/09/23  6:16 PM   Specimen: Anterior Nasal Swab  Result Value Ref Range   SARS Coronavirus 2 by RT PCR NEGATIVE NEGATIVE   Influenza A by PCR NEGATIVE NEGATIVE   Influenza B by PCR NEGATIVE NEGATIVE   Resp Syncytial Virus by PCR NEGATIVE NEGATIVE  Brain natriuretic peptide     Status: None   Collection Time: 01/09/23  6:16 PM  Result Value Ref Range   B Natriuretic Peptide 86.3 0.0 - 100.0 pg/mL  TSH     Status: Abnormal   Collection Time: 01/09/23  6:16 PM  Result Value Ref Range   TSH 16.352 (H) 0.350 - 4.500 uIU/mL  Troponin I (High Sensitivity)     Status: None    Collection Time: 01/09/23  7:57 PM  Result Value Ref Range   Troponin I (High Sensitivity) 17 <18 ng/L   Basic Metabolic Panel: Recent Labs  Lab 01/09/23 1755  NA 135  K 3.0*  CL 94*  CO2 25  GLUCOSE 161*  BUN 11  CREATININE 1.49*  CALCIUM 9.1   Liver Function Tests: Recent Labs  Lab 01/09/23 1755  AST 28  ALT 25  ALKPHOS 102  BILITOT 0.6  PROT 6.5  ALBUMIN 3.3*   No results for input(s): "LIPASE", "AMYLASE" in the last 168 hours. No results for input(s): "AMMONIA" in the last 168 hours. CBC: Recent Labs  Lab 01/09/23 1755  WBC 16.8*  NEUTROABS 13.0*  HGB 13.4  HCT 41.1  MCV 91.5  PLT 352   Cardiac Enzymes: Recent Labs  Lab 01/09/23 1755 01/09/23 1957  TROPONINIHS 14 17    BNP (last 3 results) Recent Labs    08/23/22 1410 10/11/22 1513 10/24/22 1559  PROBNP 404* 764* 210  CBG: No results for input(s): "GLUCAP" in the last 168 hours.  Radiological Exams on Admission:  DG Chest Portable 1 View  Result Date: 01/09/2023 CLINICAL DATA:  Shortness of EXAM: PORTABLE CHEST 1 VIEW COMPARISON:  01/02/2023, 10/06/2022, CT 08/31/2022 FINDINGS: Prominent epicardial pericardial fat. Minimal atelectasis right base. Stable cardiomediastinal silhouette. No pneumothorax IMPRESSION: Minimal right base atelectasis. Electronically Signed   By: Jasmine Pang M.D.   On: 01/09/2023 18:42    EKG: Independently reviewed.  No complex, supraventricular tachycardia at 168 bpm.  Unfortunately I cannot definitely tell what the rhythm is in terms of junctional tachycardia versus A-fib versus PSVT   Assessment and Plan: * Supraventricular tachycardia This has been diagnosed by cardiology as new onset atrial fibrillation with rapid ventricular response.  Given patient's marked symptoms, although not associated with troponin elevation or chest pain.  Therefore both rhythm and rate control considered by cardio.  Typical rate control medications cannot be used in the patient  because of her soft blood pressure.  Cardiology would like to avoid digoxin for CKD reasons.  Although we could potentially monitor rates and avoid hypokalemia to use digoxin.  Rhythm control is complicated by the patient not being a candidate for amiodarone because of pulmonary fibrosis, they are considering dofetilide.  We will comanage with cardiology in this regard.  Patient has been started on Eliquis per them and Plavix has been stopped for this reason.  Echocardiogram is pending.  Patient seems to have subclinical hypothyroidism with normal free T4.  But at least no marked hypothyroidism is noted  From my side, if patient has recurrence of supraventricular tachycardia, we can consider adenosine to definitely diagnose if this is A-fib versus PSVT.  Systolic CHF with reduced left ventricular function, NYHA class 3 (HCC) 8 echo pending, per cardiology note September 2023 echo had normal EF  Leukocytosis This is chronic.  However seems to be worse today than recently documented.  Patient has been on chronic steroids for her gout.  I do not clinically find a focus of infection at this time.  Will check blood cultures and monitor clinically.  This is neutrophil predominant      Advance Care Planning:   Code Status: Prior full code  Consults: cardiology note in chart  Family Communication: per patient.  Severity of Illness: The appropriate patient status for this patient is INPATIENT. Inpatient status is judged to be reasonable and necessary in order to provide the required intensity of service to ensure the patient's safety. The patient's presenting symptoms, physical exam findings, and initial radiographic and laboratory data in the context of their chronic comorbidities is felt to place them at high risk for further clinical deterioration. Furthermore, it is not anticipated that the patient will be medically stable for discharge from the hospital within 2 midnights of admission.   * I  certify that at the point of admission it is my clinical judgment that the patient will require inpatient hospital care spanning beyond 2 midnights from the point of admission due to high intensity of service, high risk for further deterioration and high frequency of surveillance required.*  Author: Nolberto Hanlon, MD 01/09/2023 11:06 PM  For on call review [email or web address].

## 2023-01-09 NOTE — ED Notes (Signed)
Pt converted out of afib with RVR via vagal maneuver.

## 2023-01-09 NOTE — Assessment & Plan Note (Addendum)
This has been diagnosed by cardiology as new onset atrial fibrillation with rapid ventricular response.  Given patient's marked symptoms, although not associated with troponin elevation or chest pain.  Therefore both rhythm and rate control considered by cardio.  Typical rate control medications cannot be used in the patient because of her soft blood pressure.  Cardiology would like to avoid digoxin for CKD reasons.  Although we could potentially monitor rates and avoid hypokalemia to use digoxin.  Rhythm control is complicated by the patient not being a candidate for amiodarone because of pulmonary fibrosis, they are considering dofetilide.  We will comanage with cardiology in this regard.  Patient has been started on Eliquis per them and Plavix has been stopped for this reason.  Echocardiogram is pending.  Patient seems to have subclinical hypothyroidism with normal free T4.  But at least no marked hypothyroidism is noted  From my side, if patient has recurrence of supraventricular tachycardia, we can consider adenosine to definitely diagnose if this is A-fib versus PSVT.

## 2023-01-10 ENCOUNTER — Inpatient Hospital Stay (HOSPITAL_COMMUNITY): Payer: BC Managed Care – PPO

## 2023-01-10 DIAGNOSIS — I4891 Unspecified atrial fibrillation: Secondary | ICD-10-CM

## 2023-01-10 DIAGNOSIS — I471 Supraventricular tachycardia, unspecified: Secondary | ICD-10-CM | POA: Diagnosis not present

## 2023-01-10 LAB — URINALYSIS, ROUTINE W REFLEX MICROSCOPIC
Bilirubin Urine: NEGATIVE
Glucose, UA: 50 mg/dL — AB
Ketones, ur: NEGATIVE mg/dL
Nitrite: POSITIVE — AB
Protein, ur: NEGATIVE mg/dL
Specific Gravity, Urine: 1.015 (ref 1.005–1.030)
WBC, UA: >50 WBC/hpf (ref 0–5)
pH: 5 (ref 5.0–8.0)

## 2023-01-10 LAB — HIV ANTIBODY (ROUTINE TESTING W REFLEX): HIV Screen 4th Generation wRfx: NONREACTIVE

## 2023-01-10 LAB — MAGNESIUM: Magnesium: 1.8 mg/dL (ref 1.7–2.4)

## 2023-01-10 LAB — ECHOCARDIOGRAM COMPLETE
AR max vel: 1.31 cm2
AV Area VTI: 1.33 cm2
AV Area mean vel: 1.3 cm2
AV Mean grad: 9 mmHg
AV Peak grad: 16 mmHg
Ao pk vel: 2 m/s
Area-P 1/2: 4.39 cm2
Height: 60 in
S' Lateral: 3.4 cm
Weight: 4461.05 oz

## 2023-01-10 LAB — CULTURE, BLOOD (ROUTINE X 2): Special Requests: ADEQUATE

## 2023-01-10 LAB — CBC
HCT: 34.7 % — ABNORMAL LOW (ref 36.0–46.0)
Hemoglobin: 11.6 g/dL — ABNORMAL LOW (ref 12.0–15.0)
MCH: 31.1 pg (ref 26.0–34.0)
MCHC: 33.4 g/dL (ref 30.0–36.0)
MCV: 93 fL (ref 80.0–100.0)
Platelets: 293 10*3/uL (ref 150–400)
RBC: 3.73 MIL/uL — ABNORMAL LOW (ref 3.87–5.11)
RDW: 14.2 % (ref 11.5–15.5)
WBC: 12.8 10*3/uL — ABNORMAL HIGH (ref 4.0–10.5)
nRBC: 0 % (ref 0.0–0.2)

## 2023-01-10 LAB — BASIC METABOLIC PANEL
Anion gap: 17 — ABNORMAL HIGH (ref 5–15)
BUN: 13 mg/dL (ref 6–20)
CO2: 24 mmol/L (ref 22–32)
Calcium: 9 mg/dL (ref 8.9–10.3)
Chloride: 95 mmol/L — ABNORMAL LOW (ref 98–111)
Creatinine, Ser: 1.65 mg/dL — ABNORMAL HIGH (ref 0.44–1.00)
GFR, Estimated: 37 mL/min — ABNORMAL LOW (ref >60)
Glucose, Bld: 200 mg/dL — ABNORMAL HIGH (ref 70–99)
Potassium: 3.1 mmol/L — ABNORMAL LOW (ref 3.5–5.1)
Sodium: 136 mmol/L (ref 135–145)

## 2023-01-10 LAB — GLUCOSE, CAPILLARY: Glucose-Capillary: 220 mg/dL — ABNORMAL HIGH (ref 70–99)

## 2023-01-10 LAB — LACTIC ACID, PLASMA: Lactic Acid, Venous: 3.9 mmol/L (ref 0.5–1.9)

## 2023-01-10 LAB — PROTIME-INR
INR: 1.1 (ref 0.8–1.2)
Prothrombin Time: 14.1 seconds (ref 11.4–15.2)

## 2023-01-10 LAB — APTT: aPTT: 24 seconds (ref 24–36)

## 2023-01-10 MED ORDER — MAGNESIUM SULFATE IN D5W 1-5 GM/100ML-% IV SOLN
1.0000 g | Freq: Once | INTRAVENOUS | Status: AC
  Administered 2023-01-10: 1 g via INTRAVENOUS
  Filled 2023-01-10: qty 100

## 2023-01-10 MED ORDER — EZETIMIBE 10 MG PO TABS
10.0000 mg | ORAL_TABLET | Freq: Every day | ORAL | Status: DC
  Administered 2023-01-10 – 2023-01-13 (×4): 10 mg via ORAL
  Filled 2023-01-10 (×4): qty 1

## 2023-01-10 MED ORDER — ACETAMINOPHEN 650 MG RE SUPP: 650.0000 mg | Freq: Four times a day (QID) | RECTAL | Status: DC | PRN

## 2023-01-10 MED ORDER — ATORVASTATIN CALCIUM 40 MG PO TABS
40.0000 mg | ORAL_TABLET | Freq: Every day | ORAL | Status: DC
  Administered 2023-01-10 – 2023-01-11 (×2): 40 mg via ORAL
  Filled 2023-01-10 (×2): qty 1

## 2023-01-10 MED ORDER — LOPERAMIDE HCL 2 MG PO CAPS
2.0000 mg | ORAL_CAPSULE | Freq: Once | ORAL | Status: DC
  Filled 2023-01-10: qty 1

## 2023-01-10 MED ORDER — ACETAMINOPHEN 325 MG PO TABS
650.0000 mg | ORAL_TABLET | Freq: Four times a day (QID) | ORAL | Status: DC | PRN
  Administered 2023-01-10 – 2023-01-12 (×4): 650 mg via ORAL
  Filled 2023-01-10 (×4): qty 2

## 2023-01-10 MED ORDER — TORSEMIDE 20 MG PO TABS
20.0000 mg | ORAL_TABLET | Freq: Every day | ORAL | Status: DC
  Filled 2023-01-10: qty 1

## 2023-01-10 MED ORDER — ALLOPURINOL 100 MG PO TABS: 50.0000 mg | ORAL_TABLET | Freq: Every day | ORAL | Status: DC

## 2023-01-10 MED ORDER — POLYETHYLENE GLYCOL 3350 17 G PO PACK: 17.0000 g | PACK | Freq: Every day | ORAL | Status: DC | PRN

## 2023-01-10 MED ORDER — HEPARIN SODIUM (PORCINE) 5000 UNIT/ML IJ SOLN
5000.0000 [IU] | Freq: Three times a day (TID) | INTRAMUSCULAR | Status: DC
  Administered 2023-01-10 – 2023-01-13 (×9): 5000 [IU] via SUBCUTANEOUS
  Filled 2023-01-10 (×9): qty 1

## 2023-01-10 MED ORDER — POTASSIUM CHLORIDE 20 MEQ PO PACK
20.0000 meq | PACK | Freq: Every day | ORAL | Status: DC
  Filled 2023-01-10: qty 1

## 2023-01-10 MED ORDER — LACTATED RINGERS IV SOLN: INTRAVENOUS | Status: DC

## 2023-01-10 MED ORDER — SODIUM CHLORIDE 0.9 % IV SOLN
12.5000 mg | Freq: Once | INTRAVENOUS | Status: DC
  Filled 2023-01-10: qty 0.5

## 2023-01-10 MED ORDER — ASPIRIN 81 MG PO TBEC
81.0000 mg | DELAYED_RELEASE_TABLET | Freq: Every day | ORAL | Status: DC
  Administered 2023-01-10 – 2023-01-13 (×4): 81 mg via ORAL
  Filled 2023-01-10 (×4): qty 1

## 2023-01-10 MED ORDER — LEVOTHYROXINE SODIUM 100 MCG PO TABS
200.0000 ug | ORAL_TABLET | Freq: Every morning | ORAL | Status: DC
  Administered 2023-01-11 – 2023-01-13 (×3): 200 ug via ORAL
  Filled 2023-01-10 (×3): qty 2

## 2023-01-10 MED ORDER — POTASSIUM CHLORIDE CRYS ER 20 MEQ PO TBCR
20.0000 meq | EXTENDED_RELEASE_TABLET | Freq: Once | ORAL | Status: AC
  Administered 2023-01-10: 20 meq via ORAL
  Filled 2023-01-10: qty 1

## 2023-01-10 MED ORDER — SODIUM CHLORIDE 0.9 % IV SOLN
2.0000 g | INTRAVENOUS | Status: DC
  Administered 2023-01-10: 2 g via INTRAVENOUS
  Filled 2023-01-10: qty 20

## 2023-01-10 MED ORDER — ALPRAZOLAM 0.25 MG PO TABS
0.2500 mg | ORAL_TABLET | Freq: Every evening | ORAL | Status: DC | PRN
  Administered 2023-01-11: 0.25 mg via ORAL
  Filled 2023-01-10 (×2): qty 1

## 2023-01-10 MED ORDER — ALBUTEROL SULFATE (2.5 MG/3ML) 0.083% IN NEBU: 3.0000 mL | INHALATION_SOLUTION | RESPIRATORY_TRACT | Status: DC | PRN

## 2023-01-10 MED ORDER — LEVOTHYROXINE SODIUM 175 MCG PO TABS
175.0000 ug | ORAL_TABLET | Freq: Every morning | ORAL | Status: DC
  Administered 2023-01-10: 175 ug via ORAL
  Filled 2023-01-10: qty 1

## 2023-01-10 MED ORDER — SODIUM CHLORIDE 0.9% FLUSH
3.0000 mL | Freq: Two times a day (BID) | INTRAVENOUS | Status: DC
  Administered 2023-01-10 – 2023-01-13 (×7): 3 mL via INTRAVENOUS

## 2023-01-10 MED ORDER — COLCHICINE 0.6 MG PO TABS
0.6000 mg | ORAL_TABLET | Freq: Every day | ORAL | Status: DC
  Administered 2023-01-10 – 2023-01-12 (×3): 0.6 mg via ORAL
  Filled 2023-01-10 (×3): qty 1

## 2023-01-10 MED ORDER — LABETALOL HCL 5 MG/ML IV SOLN
INTRAVENOUS | Status: AC
  Filled 2023-01-10: qty 4

## 2023-01-10 MED ORDER — POTASSIUM CHLORIDE CRYS ER 20 MEQ PO TBCR
40.0000 meq | EXTENDED_RELEASE_TABLET | Freq: Once | ORAL | Status: AC
  Administered 2023-01-10: 40 meq via ORAL
  Filled 2023-01-10: qty 2

## 2023-01-10 MED ORDER — DIPHENHYDRAMINE HCL 25 MG PO CAPS: 25.0000 mg | ORAL_CAPSULE | Freq: Four times a day (QID) | ORAL | Status: DC | PRN

## 2023-01-10 MED ORDER — UMECLIDINIUM BROMIDE 62.5 MCG/ACT IN AEPB
1.0000 | INHALATION_SPRAY | Freq: Every day | RESPIRATORY_TRACT | Status: DC
  Administered 2023-01-10 – 2023-01-13 (×4): 1 via RESPIRATORY_TRACT
  Filled 2023-01-10 (×2): qty 7

## 2023-01-10 MED ORDER — TIOTROPIUM BROMIDE MONOHYDRATE 1.25 MCG/ACT IN AERS: 1.0000 | INHALATION_SPRAY | Freq: Every day | RESPIRATORY_TRACT | Status: DC

## 2023-01-10 MED ORDER — METOPROLOL SUCCINATE ER 25 MG PO TB24
12.5000 mg | ORAL_TABLET | Freq: Every day | ORAL | Status: DC
  Filled 2023-01-10: qty 1

## 2023-01-10 MED ORDER — FLUTICASONE FUROATE-VILANTEROL 200-25 MCG/ACT IN AEPB
1.0000 | INHALATION_SPRAY | Freq: Every day | RESPIRATORY_TRACT | Status: DC
  Administered 2023-01-10 – 2023-01-13 (×4): 1 via RESPIRATORY_TRACT
  Filled 2023-01-10: qty 28

## 2023-01-10 MED ORDER — DILTIAZEM HCL ER COATED BEADS 240 MG PO CP24
240.0000 mg | ORAL_CAPSULE | Freq: Every day | ORAL | Status: DC
  Administered 2023-01-10 – 2023-01-13 (×4): 240 mg via ORAL
  Filled 2023-01-10 (×4): qty 1

## 2023-01-10 MED ORDER — DILTIAZEM HCL 25 MG/5ML IV SOLN
10.0000 mg | Freq: Once | INTRAVENOUS | Status: DC
  Filled 2023-01-10: qty 5

## 2023-01-10 NOTE — TOC Initial Note (Addendum)
Transition of Care [facility] 2) - Initial/Assessment Note    Patient Details  Name: Katie Rasmussen MRN: [id number] Date of Birth: 08-17-1970  Transition of Care [facility]) CM/SW Contact:    Leone Haven, RN Phone Number: 01/10/2023, 2:57 PM  Clinical Narrative:                 From home with spouse and her son, she has PCP, Dr. Grace Bushy at [facility], she has an apt coming up on 7/9, she has insurance on file. She currently has no HH services in place , she has a w/chair, walker, home oxygen with [facility] 2 liters when sleeping or  laying down per patient, she also has percussion vest and cpap machine. She states her spouse is her support system , she gets her medications from [facility] on [address] In [place].  She states she does not ambulate that much.  Per pt eval rec HHPT , NCM offered choice for HHPT, and [facility], she does not have a preference.  NCM made referral to [facility] with [facility].  She states she will run the insurance to see if it is in network and get back with this NCM. Awaiting to hear back from [place] with [facility]. Per Haywood Lasso , insurance is out ot network.  NCM made referral to [facility] with [facility] awaiting to hear back. Morrie Sheldon with [facility] states they can not take referral.  Patient will need to be set up with HHPT, HHAIDE.    Expected Discharge Plan: Home w Home Health Services Barriers to Discharge: Continued Medical Work up   Patient Goals and CMS Choice Patient states their goals for this hospitalization and ongoing recovery are:: return home with family CMS Medicare.gov Compare Post Acute Care list provided to:: Patient Choice offered to / list presented to : Patient      Expected Discharge Plan and Services In-house Referral: NA Discharge Planning Services: CM Consult Post Acute Care Choice: Home Health Living arrangements for the past 2 months: Single Family Home                 DME Arranged: N/A DME Agency: NA       HH  Arranged: PT, Nurse's Aide HH Agency [facility] Date HH Agency Contacted: 01/10/23 Time HH Agency Contacted: 1456 Representative spoke with at [facility] [place]  Prior Living Arrangements/Services Living arrangements for the past 2 months: Single Family Home Lives with:: Spouse, Adult Children Patient language and need for interpreter reviewed:: Yes Do you feel safe going back to the place where you live?: Yes      Need for Family Participation in Patient Care: Yes (Comment) Care giver support system in place?: Yes (comment) Current home services: DME (w/chair , walker, home oxygen [facility] 2 liters, percussion vest, cpap machine.) Criminal Activity/Legal Involvement Pertinent to Current Situation/Hospitalization: No - Comment as needed  Activities of Daily Living Home Assistive Devices/Equipment: Wheelchair ADL Screening (condition at time of admission) Patient's cognitive ability adequate to safely complete daily activities?: Yes Is the patient deaf or have difficulty hearing?: Yes Does the patient have difficulty seeing, even when wearing glasses/contacts?: No Does the patient have difficulty concentrating, remembering, or making decisions?: No Patient able to express need for assistance with ADLs?: Yes Does the patient have difficulty dressing or bathing?: Yes Independently performs ADLs?: No Does the patient have difficulty walking or climbing stairs?: Yes Weakness of Legs: Both Weakness of Arms/Hands: None  Permission Sought/Granted Permission sought to share information with :  Case Manager Permission granted to share information with : Yes, Verbal Permission Granted     Permission granted to share info w AGENCY: HH agency        Emotional Assessment Appearance:: Appears stated age Attitude/Demeanor/Rapport: Engaged Affect (typically observed): Appropriate Orientation: : Oriented to Self, Oriented to Place, Oriented to  Time, Oriented to Situation Alcohol /  Substance Use: Not Applicable Psych Involvement: No (comment)  Admission diagnosis:  A-fib (HCC) [I48.91] Patient Active Problem List   Diagnosis Date Noted   A-fib (HCC) 01/09/2023   Chronic venous insufficiency 08/30/2022   Class 3 severe obesity with serious comorbidity in adult (HCC) 06/14/2022   Supraventricular tachycardia 07/08/2020   Gout    Diastolic CHF (HCC)    Chronic respiratory failure (HCC)    Ankle sprain 02/18/2020   Asthma exacerbation 02/18/2020   Chest pain 02/18/2020   CKD (chronic kidney disease), stage III (HCC) 02/18/2020   Hypertension 02/18/2020   Hypothyroidism 02/18/2020   Leukocytosis 02/18/2020   OSA (obstructive sleep apnea) 02/18/2020   Peripheral edema 02/18/2020   Respiratory acidosis 02/18/2020   Sepsis (HCC) 02/18/2020   Upper respiratory infection 02/18/2020   Weakness 02/18/2020   Orthostatic hypotension 02/18/2020   Postablative hypothyroidism 04/03/2019   Dyspnea on exertion 05/07/2018   Ischemic cardiomyopathy initial ejection fraction 35 to 40% in March 2019, normalization of ejection fraction based on echocardiogram from 2020 10/08/2017   Coronary artery disease drug-eluting stent implanted to proximal LAD in March 2019 in face of acute myocardial infarction 10/08/2017   Dyslipidemia 10/08/2017   DCM (dilated cardiomyopathy) (HCC)    Elevated troponin    Acute on chronic respiratory failure with hypoxia and hypercapnia (HCC) 09/14/2017   Obesity hypoventilation syndrome (HCC)    Acute combined systolic and diastolic CHF, NYHA class 3 (HCC)    COPD (chronic obstructive pulmonary disease) (HCC)    Systolic CHF with reduced left ventricular function, NYHA class 3 (HCC) 09/13/2017   Pneumonia 08/10/2017   Bronchitis due to tobacco use 08/10/2017   PCP:  [name], FNP Pharmacy:   [facility] - [place], [place] - [address] [place] [address] Phone: [phone] Fax: [phone]     Social  Determinants of Health (SDOH) Social History: SDOH Screenings   Food Insecurity: No Food Insecurity (01/10/2023)  Stress: Stress Concern Present (09/14/2017)  Tobacco Use: Medium Risk (01/09/2023)   SDOH Interventions:     Readmission Risk Interventions     No data to display

## 2023-01-10 NOTE — Consult Note (Addendum)
ELECTROPHYSIOLOGY CONSULT NOTE    Patient ID: Katie Rasmussen MRN: [id number], DOB/AGE: 1971/01/30 52 y.o.  Admit date: 01/09/2023 Date of Consult: 01/10/2023  Primary Physician: [name], FNP Primary Cardiologist: Gypsy Balsam, MD  Electrophysiologist: Dr. Elberta Fortis, new consult 7/3  Referring Provider: Dr. Flora Lipps  Patient Profile: Katie Rasmussen is a 52 y.o. female with a history of , hypothyroidism, pulmonary fibrosis, CKD IIIb, CAD, ICM with recovered EF who is being seen today for the evaluation of SVT at the request of Dr. Flora Lipps.  HPI:  Katie Rasmussen is a 52 y.o. female who presented to [facility] on 7/2 with reports of "feeling miserable".      At baseline, she is non-ambulatory due to pain in her joints and gout.  The patient reported at least 6 days of feeling short of breath, weak and fast heart rate. She was seen in the [place] ER x2 for the same symptoms and was sent home being told she had anxiety.  She reportedly followed up with her Cardiologist on 7/2 and had a HR in the 160's.  She was referred urgently to [facility] for evaluation.  She had not been eating well for at least a week due to nausea. She continued to take her diuretics.  She called EMS and was treated with 20 mg IV diltiazem in route with improvement in her HR.  Initial EKG in ER showed ST.  Her initial SBP 80/90's in the ER. Initial labs notable for hypokalemia, Cr 1.49, HS-troponin 14, BNP 86, WBC 12.8 (chronic leukocytosis on steroids). TSH found to be 16.3, free T4 0.86.  CXR showed basilar atx vs airspace disease.RVP negative for RSV, COVID, influenza. Cardiology was consulted who felt initial rhythms were possibly AFib. She was admitted to [facility].  She had improved control with Cardizem. Cardizem was increased per Cardiology.   EP consulted for evaluation 7/3 of possible AF vs SVT.   She denies chest pain, palpitations, dyspnea, PND, orthopnea, nausea, vomiting, dizziness,  syncope, edema, weight gain, or early satiety.   Labs Potassium3.1* (07/03 0249) Magnesium  1.8 (07/03 0252) Creatinine, ser  1.65* (07/03 0249) PLT  293 (07/03 0249) HGB  11.6* (07/03 0249) WBC 12.8* (07/03 0249) Troponin I (High Sensitivity)17 (07/02 1957).    Past Medical History:  Diagnosis Date   Acute combined systolic and diastolic CHF, NYHA class 3 (HCC)    Acute on chronic respiratory failure with hypoxia and hypercapnia (HCC) 09/14/2017   Ankle sprain 02/18/2020   Asthma exacerbation 02/18/2020   Bronchitis due to tobacco use 08/2017   Chest pain 02/18/2020   Chronic respiratory failure (HCC)    CKD (chronic kidney disease), stage III (HCC)    COPD (chronic obstructive pulmonary disease) (HCC)    Coronary artery disease    DCM (dilated cardiomyopathy) (HCC)    Diastolic CHF (HCC)    Dyslipidemia 10/08/2017   Dyspnea on exertion 05/07/2018   Elevated troponin    Gout    Hypertension 02/18/2020   Hypothyroidism    h/o Grave's, s/p thyroidectomy & RAI in 1990s   Ischemic cardiomyopathy initial ejection fraction 35 to 40% in March 2019, normalization of ejection fraction based on echocardiogram from 2020 10/08/2017   Ejection fraction 35-40% in March 2019   Leukocytosis 02/18/2020   Obesity hypoventilation syndrome (HCC)    Orthostatic hypotension 02/18/2020   OSA (obstructive sleep apnea) 02/18/2020   Peripheral edema 02/18/2020   Pneumonia 08/2017   Postablative hypothyroidism 04/03/2019   Respiratory acidosis 02/18/2020  Sepsis (HCC) 02/18/2020   Supraventricular tachycardia 07/08/2020   Systolic CHF with reduced left ventricular function, NYHA class 3 (HCC) 09/13/2017   Upper respiratory infection 02/18/2020   Weakness 02/18/2020     Surgical History:  Past Surgical History:  Procedure Laterality Date   CESAREAN SECTION     CORONARY STENT INTERVENTION N/A 09/18/2017   Procedure: CORONARY STENT INTERVENTION;  Surgeon: Tonny Bollman, MD;  Location: [facility] INVASIVE CV LAB;   Service: Cardiovascular;  Laterality: N/A;  prox LAD   LEFT HEART CATH AND CORONARY ANGIOGRAPHY N/A 09/18/2017   Procedure: LEFT HEART CATH AND CORONARY ANGIOGRAPHY;  Surgeon: Tonny Bollman, MD;  Location: [facility] INVASIVE CV LAB;  Service: Cardiovascular;  Laterality: N/A;   THYROID SURGERY     s/p RAI for Grave's     Facility-Administered Medications Prior to Admission  Medication Dose Route Frequency Provider Last Rate Last Admin   triamcinolone acetonide (KENALOG) 10 MG/ML injection 10 mg  10 mg Other Once Asencion Islam, DPM       Medications Prior to Admission  Medication Sig Dispense Refill Last Dose   acetaminophen (TYLENOL) 500 MG tablet Take 500 mg by mouth every 6 (six) hours as needed for mild pain or moderate pain.      albuterol (PROVENTIL) (2.5 MG/3ML) 0.083% nebulizer solution Take 3 mLs (2.5 mg total) by nebulization every 6 (six) hours as needed for wheezing or shortness of breath. 75 mL 12    allopurinol (ZYLOPRIM) 100 MG tablet Take 1 tablet (100 mg total) by mouth daily. 30 tablet 6    ALPRAZolam (XANAX) 0.25 MG tablet Take 1 tablet (0.25 mg total) by mouth at bedtime as needed for anxiety. 10 tablet 0    amoxicillin-clavulanate (AUGMENTIN) 875-125 MG tablet Take 1 tablet by mouth 2 (two) times daily.      aspirin EC 81 MG EC tablet Take 1 tablet (81 mg total) by mouth daily.      atorvastatin (LIPITOR) 40 MG tablet Take 1 tablet (40 mg total) by mouth daily. 90 tablet 3    clopidogrel (PLAVIX) 75 MG tablet TAKE 1 TABLET BY MOUTH ONCE DAILY WITH BREAKFAST. (Patient taking differently: Take 75 mg by mouth daily.) 90 tablet 3    colchicine 0.6 MG tablet Take 1 tablet (0.6 mg total) by mouth 2 (two) times daily. 20 tablet 0    colchicine 0.6 MG tablet Take 1 tablet (0.6 mg total) by mouth daily for 7 days. 7 tablet 0    cyanocobalamin (,VITAMIN B-12,) 1000 MCG/ML injection INJECT SUBQ ONCE MONTHLY. (Patient taking differently: Inject 100 mcg into the muscle every 30 (thirty)  days.) 3 mL 4    diltiazem (CARDIZEM CD) 180 MG 24 hr capsule TAKE ONE CAPSULE BY MOUTH EVERY DAY 90 capsule 0    diltiazem (CARDIZEM) 30 MG tablet Take 1 tablet (30 mg total) by mouth every 6 (six) hours as needed FOR PALPITATIONS (Patient taking differently: Take 30 mg by mouth every 6 (six) hours as needed (Palpitations).) 90 tablet 3    ezetimibe (ZETIA) 10 MG tablet Take 1 tablet (10 mg total) by mouth daily. 30 tablet 3    fluticasone furoate-vilanterol (BREO ELLIPTA) 200-25 MCG/INH AEPB Inhale 1 puff into the lungs daily. 1 each 11    levothyroxine (SYNTHROID) 137 MCG tablet TAKE 1 TABLET BY MOUTH EVERY MORNING ON AN EMPTY STOMACH. (Patient taking differently: Take 137 mcg by mouth daily before breakfast.) 90 tablet 2    methylPREDNISolone (MEDROL DOSEPAK) 4 MG TBPK  tablet Take as directed for 6 days 1 each 0    metolazone (ZAROXOLYN) 2.5 MG tablet Take 1 tablet (2.5 mg total) by mouth 2 (two) times a week. 8 tablet 3    metoprolol succinate (TOPROL-XL) 25 MG 24 hr tablet TAKE 1 TABLET BY MOUTH ONCE DAILY. 90 tablet 3    metoprolol tartrate (LOPRESSOR) 25 MG tablet Take 0.5 tablets (12.5 mg total) by mouth every 8 (eight) hours as needed. (Patient taking differently: Take 12.5 mg by mouth every 8 (eight) hours as needed (elevated HR).) 180 tablet 3    potassium chloride (KLOR-CON) 10 MEQ tablet Take 1 tablet (10 mEq total) by mouth daily. 30 tablet 2    predniSONE (DELTASONE) 10 MG tablet Take 10 mg by mouth daily with breakfast.      SPIRIVA RESPIMAT 1.25 MCG/ACT AERS Inhale 1 each into the lungs daily.      torsemide (DEMADEX) 20 MG tablet Take 1 tablet (20 mg total) by mouth 2 (two) times daily. 180 tablet 2    VENTOLIN HFA 108 (90 Base) MCG/ACT inhaler INHALE 2 PUFFS BY MOUTH EVERY 4 HOURS ASNEEDED FOR SHORTNESS OF BREATH. (Patient taking differently: Inhale 2 puffs into the lungs every 4 (four) hours as needed for wheezing or shortness of breath. INHALE 2 PUFFS BY MOUTH EVERY 4 HOURS  ASNEEDED FOR SHORTNESS OF BREATH.) 18 g 0    Vitamin D, Ergocalciferol, (DRISDOL) 1.25 MG (50000 UNIT) CAPS capsule Take 50,000 Units by mouth once a week.       Inpatient Medications:   allopurinol  50 mg Oral Daily   aspirin EC  81 mg Oral Daily   atorvastatin  40 mg Oral Daily   colchicine  0.6 mg Oral QHS   diltiazem  240 mg Oral Daily   ezetimibe  10 mg Oral Daily   fluticasone furoate-vilanterol  1 puff Inhalation Daily   heparin  5,000 Units Subcutaneous Q8H   levothyroxine  175 mcg Oral q morning   potassium chloride  20 mEq Oral Daily   sodium chloride flush  3 mL Intravenous Q12H   umeclidinium bromide  1 puff Inhalation Daily    Allergies:  Allergies  Allergen Reactions   Enoxaparin Sodium Other (See Comments)   Iodine Hives and Itching   Lovenox [Enoxaparin]    No Known Allergies Hives   Sulfa Antibiotics Other (See Comments)   Shellfish Allergy Hives and Itching    Family History  Problem Relation Age of Onset   Heart disease Mother    Hypertension Mother    Heart disease Father    Diabetes Father      Physical Exam: Vitals:   01/10/23 0045 01/10/23 0417 01/10/23 0500 01/10/23 0807  BP: (!) 135/59 119/68  (!) 143/57  Pulse: 95 81  88  Resp: 18 18  18   Temp: 98.3 F (36.8 C) 98.3 F (36.8 C)    TempSrc: Oral Oral    SpO2: 98% 98%  100%  Weight: 126.4 kg  126.5 kg   Height: 5' (1.524 m)       GEN- NAD, A&O x 3, normal affect/pleasant, BMI 54 HEENT: Normocephalic, atraumatic Lungs- CTAB, distant due to body habitus, Normal effort.  Heart- Regular rate and rhythm, No M/G/R.  GI- Soft, NT, ND.  Extremities- No clubbing, cyanosis, or edema   Radiology/Studies: DG Chest Portable 1 View  Result Date: 01/09/2023 CLINICAL DATA:  Shortness of EXAM: PORTABLE CHEST 1 VIEW COMPARISON:  01/02/2023, 10/06/2022, CT 08/31/2022 FINDINGS: Prominent  epicardial pericardial fat. Minimal atelectasis right base. Stable cardiomediastinal silhouette. No pneumothorax  IMPRESSION: Minimal right base atelectasis. Electronically Signed   By: Jasmine Pang M.D.   On: 01/09/2023 18:42    EKG: Narrow complex tachycardia, rate 168, intermittent sinus beats noted, query short RP tachycardia (personally reviewed)  TELEMETRY: SR with episodes of SVT 140's (personally reviewed)  STUDIES:  ECHO 04/05/22 > LVEF 60-65%, no RWMA, LA mildly dilated, normal MV, normal AV,   Assessment/Plan:  SVT  Negative troponin & BNP.  Home regimen PTA diltiazem every day & PRN, as well as Toprol every day & PRN lopressor. EKG with possible two different SVT's, suspect AVNART and ATach.  -home beta blocker stopped 7/3 secondary to pulmonary disease  -diltiazem increased to 240mg  every day per Cardiology.  Follow for response.  -tele monitoring, EKG PRN  -BMI would likely preclude ablation, discussed weight loss needs for health and possible consideration for ablation  -pulmonary disease and CAD limit antiarrhythmic drug options  -EP follow up arranged in [place]   HFpEF -euvolemic on exam  -per Cardiology   CAD s/p PCI  -troponin negative, no anginal symptoms   Dyspnea  Pulmonary Fibrosis  -per TRH      EP Katie Rasmussen be available PRN. Please call with new concerns.  For questions or updates, please contact [facility] Please consult [email or web address] for contact info under Cardiology/STEMI.  Signed, Canary Brim, MSN, APRN, NP-C, AGACNP-BC [facility] - Electrophysiology  01/10/2023, 9:39 AM   I have seen and examined this patient with Canary Brim.  Agree with above, note added to reflect my findings.  Patient with a past medical history as above.  She presented to her primary cardiologist office with palpitations and fatigue.  She had heart rates in the 160s.  She was then referred to [facility] for evaluation.  She was found to be hypotensive in the emergency room with hypokalemia.  She was given adenosine with improvement in her symptoms.  She had previously  tried her as needed metoprolol and diltiazem without effect.  Since being in the hospital, she is continue to have short runs of likely SVT, but nothing prolonged.  GEN: Well nourished, well developed, in no acute distress  HEENT: normal  Neck: no JVD, carotid bruits, or masses Cardiac: RRR; no murmurs, rubs, or gallops,no edema  Respiratory:  clear to auscultation bilaterally, normal work of breathing GI: soft, nontender, nondistended, + BS MS: no deformity or atrophy  Skin: warm and dry Neuro:  Strength and sensation are intact Psych: euthymic mood, full affect   SVT: Appears to have 2 different mechanisms.  Concerning for both AVNRT and atrial tachycardia.  No atrial fibrillation noted.  She was previously on low-dose diltiazem.  Diltiazem has been increased to 40 mg daily.  Would continue at this current dose.  Morbid obesity restricts procedure options at this time.  Shamari Lofquist M. Tahmir Kleckner MD 01/10/2023 3:17 PM

## 2023-01-10 NOTE — Significant Event (Signed)
Pt with tachycardia, itching following or during rocephin earlier this evening.  Will DC rocephin and let day team address.

## 2023-01-10 NOTE — Progress Notes (Signed)
PROGRESS NOTE    Katie Rasmussen  ZOX:[id number] DOB: 1971-04-29 DOA: 01/09/2023 PCP: [name], FNP  52/F with history of pulmonary fibrosis, CKD, ischemic cardiomyopathy with recovered EF, CAD, hypothyroidism presented to the ED with palpitations and feeling miserable, seen in [place] ER twice, presented to [facility], ED, labs notable for hypokalemia creatinine 1.4, troponin 14, BNP 86, WBC 12.8, TSH 16, chest x-ray with basilar atelectasis   Subjective: -Feels better this morning, converted to sinus rhythm  Assessment and Plan:  SVT versus A-fib RVR -Cards, EP consulting -She was not taking long-acting Cardizem prior to admission, was only on as needed -Metoprolol discontinued -Follow-up repeat echo  Hypothyroidism -On Synthroid 175 mcg, reports compliance, TSH significantly elevated at 16 with normal free T4, will increase Synthroid dose further -Will need repeat TSH in 6 weeks  UTI -start ceftriaxone  Chronic diastolic CHF -Last echo 9/23 with preserved EF, normal RV -Resume diuretics tomorrow, appears euvolemic  Leukocytosis Chronic, stable, improving likely steroid related, she is afebrile and nontoxic  Chronic pulmonary fibrosis -Follow-up with pulmonary  DVT prophylaxis: Eliquis held Code Status: Full code Family Communication: None present Disposition Plan: Home likely tomorrow  Consultants:    Procedures:   Antimicrobials:    Objective: Vitals:   01/10/23 0417 01/10/23 0500 01/10/23 0807 01/10/23 1128  BP: 119/68  (!) 143/57   Pulse: 81  88   Resp: 18  18   Temp: 98.3 F (36.8 C)  98.3 F (36.8 C)   TempSrc: Oral  Oral   SpO2: 98%  100% 100%  Weight:  126.5 kg    Height:        Intake/Output Summary (Last 24 hours) at 01/10/2023 1140 Last data filed at 01/10/2023 0600 Gross per 24 hour  Intake 860 ml  Output 200 ml  Net 660 ml   Filed Weights   01/09/23 1755 01/10/23 0045 01/10/23 0500  Weight: 124.7 kg 126.4 kg  126.5 kg    Examination:  General exam: Appears calm and comfortable  Respiratory system: Clear to auscultation Cardiovascular system: S1 & S2 heard, RRR.  Abd: nondistended, soft and nontender.Normal bowel sounds heard. Central nervous system: Alert and oriented. No focal neurological deficits. Extremities: no edema Skin: No rashes Psychiatry:  Mood & affect appropriate.     Data Reviewed:   CBC: Recent Labs  Lab 01/09/23 1755 01/10/23 0249  WBC 16.8* 12.8*  NEUTROABS 13.0*  --   HGB 13.4 11.6*  HCT 41.1 34.7*  MCV 91.5 93.0  PLT 352 293   Basic Metabolic Panel: Recent Labs  Lab 01/09/23 1755 01/10/23 0249 01/10/23 0252  NA 135 136  --   K 3.0* 3.1*  --   CL 94* 95*  --   CO2 25 24  --   GLUCOSE 161* 200*  --   BUN 11 13  --   CREATININE 1.49* 1.65*  --   CALCIUM 9.1 9.0  --   MG  --   --  1.8   GFR: Estimated Creatinine Clearance: 49 mL/min (A) (by C-G formula based on SCr of 1.65 mg/dL (H)). Liver Function Tests: Recent Labs  Lab 01/09/23 1755  AST 28  ALT 25  ALKPHOS 102  BILITOT 0.6  PROT 6.5  ALBUMIN 3.3*   No results for input(s): "LIPASE", "AMYLASE" in the last 168 hours. No results for input(s): "AMMONIA" in the last 168 hours. Coagulation Profile: Recent Labs  Lab 01/10/23 0252  INR 1.1   Cardiac Enzymes: No results  for input(s): "CKTOTAL", "CKMB", "CKMBINDEX", "TROPONINI" in the last 168 hours. BNP (last 3 results) Recent Labs    08/23/22 1410 10/11/22 1513 10/24/22 1559  PROBNP 404* 764* 210   HbA1C: No results for input(s): "HGBA1C" in the last 72 hours. CBG: No results for input(s): "GLUCAP" in the last 168 hours. Lipid Profile: No results for input(s): "CHOL", "HDL", "LDLCALC", "TRIG", "CHOLHDL", "LDLDIRECT" in the last 72 hours. Thyroid Function Tests: Recent Labs    01/09/23 1755 01/09/23 1816  TSH  --  16.352*  FREET4 0.86  --    Anemia Panel: No results for input(s): "VITAMINB12", "FOLATE", "FERRITIN",  "TIBC", "IRON", "RETICCTPCT" in the last 72 hours. Urine analysis: No results found for: "COLORURINE", "APPEARANCEUR", "LABSPEC", "PHURINE", "GLUCOSEU", "HGBUR", "BILIRUBINUR", "KETONESUR", "PROTEINUR", "UROBILINOGEN", "NITRITE", "LEUKOCYTESUR" Sepsis Labs: @LABRCNTIP (procalcitonin:4,lacticidven:4)  ) Recent Results (from the past 240 hour(s))  Resp panel by RT-PCR (RSV, Flu A&B, Covid) Anterior Nasal Swab     Status: None   Collection Time: 01/09/23  6:16 PM   Specimen: Anterior Nasal Swab  Result Value Ref Range Status   SARS Coronavirus 2 by RT PCR NEGATIVE NEGATIVE Final   Influenza A by PCR NEGATIVE NEGATIVE Final   Influenza B by PCR NEGATIVE NEGATIVE Final    Comment: (NOTE) The Xpert Xpress SARS-CoV-2/FLU/RSV plus assay is intended as an aid in the diagnosis of influenza from Nasopharyngeal swab specimens and should not be used as a sole basis for treatment. Nasal washings and aspirates are unacceptable for Xpert Xpress SARS-CoV-2/FLU/RSV testing.  Fact Sheet for Patients: BloggerCourse.com  Fact Sheet for Healthcare Providers: SeriousBroker.it  This test is not yet approved or cleared by the [place] FDA and has been authorized for detection and/or diagnosis of SARS-CoV-2 by FDA under an Emergency Use Authorization (EUA). This EUA will remain in effect (meaning this test can be used) for the duration of the COVID-19 declaration under Section 564(b)(1) of the Act, 21 U.S.C. section 360bbb-3(b)(1), unless the authorization is terminated or revoked.     Resp Syncytial Virus by PCR NEGATIVE NEGATIVE Final    Comment: (NOTE) Fact Sheet for Patients: BloggerCourse.com  Fact Sheet for Healthcare Providers: SeriousBroker.it  This test is not yet approved or cleared by the [place] FDA and has been authorized for detection and/or diagnosis of SARS-CoV-2 by FDA  under an Emergency Use Authorization (EUA). This EUA will remain in effect (meaning this test can be used) for the duration of the COVID-19 declaration under Section 564(b)(1) of the Act, 21 U.S.C. section 360bbb-3(b)(1), unless the authorization is terminated or revoked.  Performed at [facility], [address], [place], [place] [address]   Culture, blood (Routine X 2) w Reflex to ID Panel     Status: None (Preliminary result)   Collection Time: 01/09/23 11:10 PM   Specimen: BLOOD  Result Value Ref Range Status   Specimen Description BLOOD BLOOD RIGHT HAND  Final   Special Requests   Final    BOTTLES DRAWN AEROBIC ONLY Blood Culture adequate volume   Culture   Final    NO GROWTH < 12 HOURS Performed at [facility], [address], [place], [place] [address]    Report Status PENDING  Incomplete     Radiology Studies: DG Chest Portable 1 View  Result Date: 01/09/2023 CLINICAL DATA:  Shortness of EXAM: PORTABLE CHEST 1 VIEW COMPARISON:  01/02/2023, 10/06/2022, CT 08/31/2022 FINDINGS: Prominent epicardial pericardial fat. Minimal atelectasis right base. Stable cardiomediastinal silhouette. No pneumothorax IMPRESSION: Minimal  right base atelectasis. Electronically Signed   By: Jasmine Pang M.D.   On: 01/09/2023 18:42     Scheduled Meds:  allopurinol  50 mg Oral Daily   aspirin EC  81 mg Oral Daily   atorvastatin  40 mg Oral Daily   colchicine  0.6 mg Oral QHS   diltiazem  240 mg Oral Daily   ezetimibe  10 mg Oral Daily   fluticasone furoate-vilanterol  1 puff Inhalation Daily   heparin  5,000 Units Subcutaneous Q8H   levothyroxine  175 mcg Oral q morning   potassium chloride  40 mEq Oral Once   sodium chloride flush  3 mL Intravenous Q12H   umeclidinium bromide  1 puff Inhalation Daily   Continuous Infusions:   LOS: 1 day    Time spent:    Zannie Cove, MD [facility]   01/10/2023, 11:40 AM

## 2023-01-10 NOTE — Progress Notes (Addendum)
Cardiology Progress Note  Patient ID: Katie Rasmussen MRN: [id number] DOB: March 05, 1971 Date of Encounter: 01/10/2023  Primary Cardiologist: Gypsy Balsam, MD  Subjective   Chief Complaint: SOB  HPI: Reports she is short of breath.  No further arrhythmias.  Euvolemic on exam.  ROS:  All other ROS reviewed and negative. Pertinent positives noted in the HPI.     Inpatient Medications  Scheduled Meds:  allopurinol  50 mg Oral Daily   apixaban  5 mg Oral BID   aspirin EC  81 mg Oral Daily   atorvastatin  40 mg Oral Daily   colchicine  0.6 mg Oral QHS   diltiazem  240 mg Oral Daily   ezetimibe  10 mg Oral Daily   fluticasone furoate-vilanterol  1 puff Inhalation Daily   levothyroxine  175 mcg Oral q morning   potassium chloride  20 mEq Oral Daily   sodium chloride flush  3 mL Intravenous Q12H   umeclidinium bromide  1 puff Inhalation Daily   Continuous Infusions:  magnesium sulfate bolus IVPB     PRN Meds: acetaminophen **OR** acetaminophen, albuterol, ALPRAZolam, polyethylene glycol   Vital Signs   Vitals:   01/10/23 0045 01/10/23 0417 01/10/23 0500 01/10/23 0807  BP: (!) 135/59 119/68  (!) 143/57  Pulse: 95 81  88  Resp: 18 18  18   Temp: 98.3 F (36.8 C) 98.3 F (36.8 C)    TempSrc: Oral Oral    SpO2: 98% 98%  100%  Weight: 126.4 kg  126.5 kg   Height: 5' (1.524 m)       Intake/Output Summary (Last 24 hours) at 01/10/2023 0847 Last data filed at 01/10/2023 0600 Gross per 24 hour  Intake 860 ml  Output 200 ml  Net 660 ml      01/10/2023    5:00 AM 01/10/2023   12:45 AM 01/09/2023    5:55 PM  Last 3 Weights  Weight (lbs) 278 lb 13.1 oz 278 lb 11.2 oz 275 lb  Weight (kg) 126.47 kg 126.417 kg 124.739 kg      Telemetry  Overnight telemetry shows sinus rhythm 80s, which I personally reviewed.   ECG  The most recent ECG shows sinus tachycardia heart rate 103, old anteroseptal infarct, which I personally reviewed.   Physical Exam   Vitals:    01/10/23 0045 01/10/23 0417 01/10/23 0500 01/10/23 0807  BP: (!) 135/59 119/68  (!) 143/57  Pulse: 95 81  88  Resp: 18 18  18   Temp: 98.3 F (36.8 C) 98.3 F (36.8 C)    TempSrc: Oral Oral    SpO2: 98% 98%  100%  Weight: 126.4 kg  126.5 kg   Height: 5' (1.524 m)       Intake/Output Summary (Last 24 hours) at 01/10/2023 0847 Last data filed at 01/10/2023 0600 Gross per 24 hour  Intake 860 ml  Output 200 ml  Net 660 ml       01/10/2023    5:00 AM 01/10/2023   12:45 AM 01/09/2023    5:55 PM  Last 3 Weights  Weight (lbs) 278 lb 13.1 oz 278 lb 11.2 oz 275 lb  Weight (kg) 126.47 kg 126.417 kg 124.739 kg    Body mass index is 54.45 kg/m.  General: Well nourished, well developed, in no acute distress Head: Atraumatic, normal size  Eyes: PEERLA, EOMI  Neck: Supple, no JVD Endocrine: No thryomegaly Cardiac: Normal S1, S2; RRR; no murmurs, rubs, or gallops Lungs: Diminished breath sounds bilaterally  Abd: Soft, nontender, no hepatomegaly  Ext: No edema, pulses 2+ Musculoskeletal: No deformities, BUE and BLE strength normal and equal Skin: Warm and dry, no rashes   Neuro: Alert and oriented to person, place, time, and situation, CNII-XII grossly intact, no focal deficits  Psych: Normal mood and affect   Labs  High Sensitivity Troponin:   Recent Labs  Lab 01/09/23 1755 01/09/23 1957  TROPONINIHS 14 17     Cardiac EnzymesNo results for input(s): "TROPONINI" in the last 168 hours. No results for input(s): "TROPIPOC" in the last 168 hours.  Chemistry Recent Labs  Lab 01/09/23 1755 01/10/23 0249  NA 135 136  K 3.0* 3.1*  CL 94* 95*  CO2 25 24  GLUCOSE 161* 200*  BUN 11 13  CREATININE 1.49* 1.65*  CALCIUM 9.1 9.0  PROT 6.5  --   ALBUMIN 3.3*  --   AST 28  --   ALT 25  --   ALKPHOS 102  --   BILITOT 0.6  --   GFRNONAA 42* 37*  ANIONGAP 16* 17*    Hematology Recent Labs  Lab 01/09/23 1755 01/10/23 0249  WBC 16.8* 12.8*  RBC 4.49 3.73*  HGB 13.4 11.6*  HCT 41.1 34.7*   MCV 91.5 93.0  MCH 29.8 31.1  MCHC 32.6 33.4  RDW 14.1 14.2  PLT 352 293   BNP Recent Labs  Lab 01/09/23 1816  BNP 86.3    DDimer No results for input(s): "DDIMER" in the last 168 hours.   Radiology  DG Chest Portable 1 View  Result Date: 01/09/2023 CLINICAL DATA:  Shortness of EXAM: PORTABLE CHEST 1 VIEW COMPARISON:  01/02/2023, 10/06/2022, CT 08/31/2022 FINDINGS: Prominent epicardial pericardial fat. Minimal atelectasis right base. Stable cardiomediastinal silhouette. No pneumothorax IMPRESSION: Minimal right base atelectasis. Electronically Signed   By: Jasmine Pang M.D.   On: 01/09/2023 18:42    Cardiac Studies  TTE 04/05/2022  1. Left ventricular ejection fraction, by estimation, is 60 to 65%. The  left ventricle has normal function. The left ventricle has no regional  wall motion abnormalities. Left ventricular diastolic parameters were  normal.   2. Right ventricular systolic function is normal. The right ventricular  size is normal.   3. Left atrial size was mildly dilated.   4. The mitral valve is normal in structure. No evidence of mitral valve  regurgitation. No evidence of mitral stenosis.   5. The aortic valve is normal in structure. Aortic valve regurgitation is  not visualized. No aortic stenosis is present.   6. The inferior vena cava is normal in size with greater than 50%  respiratory variability, suggesting right atrial pressure of 3 mmHg.   Patient Profile  Johneisha Krystan Stearnes is a 52 y.o. female with CAD, pulmonary fibrosis, ischemic cardiomyopathy with recovered ejection fraction, CKD 3B admitted on 01/09/2023 for SVT.  Assessment & Plan   # SVT versus A-fib -Reviewed her EKGs.  I am not convinced this is atrial fibrillation.  Seems quite regular.  I feel like there is a pseudo R wave noted on the strips from the ER as well as run strips from EMS. -We will have electrophysiology see her.  Hold eliquis for now. Suspect she may need to be considered  for ablation at least as an outpatient. -Stop metoprolol given lung disease.  Increase extended release diltiazem to 240 mg daily. -No signs of volume overload.  Follow-up echo. -Troponins are negative.  No chest pain. -TSH severely abnormal, will need to be  addressed by hospital medicine. -I think it is reasonable to observe for 1 more day on the telemetry.  We need to make sure this does not come back.  This is been on and off for 6 days per her report.  # Shortness of breath # Pulmonary fibrosis # HFpEF -No signs of volume overload.  No need for IV diuresis.  Can restart home diuretic once potassium has normalized.  # CAD status post PCI -No symptoms of angina.  Troponins negative.      For questions or updates, please contact [facility] Please consult [email or web address] for contact info under        Signed, Gerri Spore T. Flora Lipps, MD, [facility] [place]  [facility]  01/10/2023 8:47 AM

## 2023-01-10 NOTE — Progress Notes (Signed)
  Echocardiogram 2D Echocardiogram has been performed.  Delcie Roch 01/10/2023, 4:59 PM

## 2023-01-11 DIAGNOSIS — I471 Supraventricular tachycardia, unspecified: Secondary | ICD-10-CM | POA: Diagnosis not present

## 2023-01-11 LAB — BASIC METABOLIC PANEL
Anion gap: 12 (ref 5–15)
BUN: 16 mg/dL (ref 6–20)
CO2: 25 mmol/L (ref 22–32)
Calcium: 9 mg/dL (ref 8.9–10.3)
Chloride: 98 mmol/L (ref 98–111)
Creatinine, Ser: 1.51 mg/dL — ABNORMAL HIGH (ref 0.44–1.00)
GFR, Estimated: 41 mL/min — ABNORMAL LOW (ref >60)
Glucose, Bld: 201 mg/dL — ABNORMAL HIGH (ref 70–99)
Potassium: 3.6 mmol/L (ref 3.5–5.1)
Sodium: 135 mmol/L (ref 135–145)

## 2023-01-11 LAB — CBC
HCT: 34.9 % — ABNORMAL LOW (ref 36.0–46.0)
Hemoglobin: 11.4 g/dL — ABNORMAL LOW (ref 12.0–15.0)
MCH: 30.7 pg (ref 26.0–34.0)
MCHC: 32.7 g/dL (ref 30.0–36.0)
MCV: 94.1 fL (ref 80.0–100.0)
Platelets: 255 10*3/uL (ref 150–400)
RBC: 3.71 MIL/uL — ABNORMAL LOW (ref 3.87–5.11)
RDW: 14.2 % (ref 11.5–15.5)
WBC: 12.8 10*3/uL — ABNORMAL HIGH (ref 4.0–10.5)
nRBC: 0 % (ref 0.0–0.2)

## 2023-01-11 LAB — CULTURE, BLOOD (ROUTINE X 2)
Culture: NO GROWTH
Culture: NO GROWTH

## 2023-01-11 LAB — LACTIC ACID, PLASMA
Lactic Acid, Venous: 3.5 mmol/L (ref 0.5–1.9)
Lactic Acid, Venous: 3.6 mmol/L (ref 0.5–1.9)

## 2023-01-11 MED ORDER — SODIUM CHLORIDE 0.9 % IV SOLN
1.0000 g | INTRAVENOUS | Status: DC
  Administered 2023-01-11 – 2023-01-13 (×3): 1 g via INTRAVENOUS
  Filled 2023-01-11 (×3): qty 10

## 2023-01-11 NOTE — Progress Notes (Signed)
   01/11/23 1935  BiPAP/CPAP/SIPAP  Reason BIPAP/CPAP not in use Non-compliant

## 2023-01-11 NOTE — Progress Notes (Signed)
PROGRESS NOTE    Katie Rasmussen  WUJ:[id number] DOB: 1970-11-26 DOA: 01/09/2023 PCP: [name], FNP  52/F with history of pulmonary fibrosis, CKD, ischemic cardiomyopathy with recovered EF, CAD, hypothyroidism presented to the ED with palpitations and feeling miserable, seen in [place] ER twice, presented to [facility], ED, labs notable for hypokalemia creatinine 1.4, troponin 14, BNP 86, WBC 12.8, TSH 16, chest x-ray with basilar atelectasis   Subjective: -Multiple episodes of SVT yesterday  Assessment and Plan:  SVT  -Cards, EP consulting -Now on Cardizem CD, dose increased to 240 Mg daily, had a few episodes yesterday -Metoprolol discontinued, continue as needed diltiazem -Repeat echo unremarkable  Hypothyroidism -On Synthroid 175 mcg, reports compliance, TSH significantly elevated at 16 with normal free T4, will increase Synthroid dose further -Will need repeat TSH in 6 weeks  UTI -Continue ceftriaxone day 2, she had a short burst of tachycardia yesterday, do not think that this was related to UTI or antibiotics -Lactic acid is elevated however clinically no concern for hypoperfusion  Chronic diastolic CHF -Last echo 9/23 with preserved EF, normal RV -Resume diuretics tomorrow, appears euvolemic  Chronic pulmonary fibrosis -Follow-up with pulmonary  DVT prophylaxis: Heparin subcutaneous Code Status: Full code Family Communication: None present Disposition Plan: Home likely tomorrow  Consultants:    Procedures:   Antimicrobials:    Objective: Vitals:   01/11/23 0439 01/11/23 0500 01/11/23 0736 01/11/23 0834  BP: (!) 113/45  127/71   Pulse: 80  81   Resp: 20  18   Temp: 97.7 F (36.5 C)  97.7 F (36.5 C)   TempSrc: Oral  Oral   SpO2: 94%  93% 97%  Weight:  126.7 kg    Height:        Intake/Output Summary (Last 24 hours) at 01/11/2023 1158 Last data filed at 01/11/2023 0930 Gross per 24 hour  Intake 1057.39 ml  Output 1702 ml  Net  -644.61 ml   Filed Weights   01/10/23 0500 01/11/23 0036 01/11/23 0500  Weight: 126.5 kg 126.7 kg 126.7 kg    Examination:  General exam: Appears calm and comfortable  Respiratory system: Clear to auscultation Cardiovascular system: S1 & S2 heard, RRR.  Abd: nondistended, soft and nontender.Normal bowel sounds heard. Central nervous system: Alert and oriented. No focal neurological deficits. Extremities: no edema Skin: No rashes Psychiatry:  Mood & affect appropriate.     Data Reviewed:   CBC: Recent Labs  Lab 01/09/23 1755 01/10/23 0249 01/11/23 0017  WBC 16.8* 12.8* 12.8*  NEUTROABS 13.0*  --   --   HGB 13.4 11.6* 11.4*  HCT 41.1 34.7* 34.9*  MCV 91.5 93.0 94.1  PLT 352 293 255   Basic Metabolic Panel: Recent Labs  Lab 01/09/23 1755 01/10/23 0249 01/10/23 0252 01/11/23 0017  NA 135 136  --  135  K 3.0* 3.1*  --  3.6  CL 94* 95*  --  98  CO2 25 24  --  25  GLUCOSE 161* 200*  --  201*  BUN 11 13  --  16  CREATININE 1.49* 1.65*  --  1.51*  CALCIUM 9.1 9.0  --  9.0  MG  --   --  1.8  --    GFR: Estimated Creatinine Clearance: 53.7 mL/min (A) (by C-G formula based on SCr of 1.51 mg/dL (H)). Liver Function Tests: Recent Labs  Lab 01/09/23 1755  AST 28  ALT 25  ALKPHOS 102  BILITOT 0.6  PROT 6.5  ALBUMIN  3.3*   No results for input(s): "LIPASE", "AMYLASE" in the last 168 hours. No results for input(s): "AMMONIA" in the last 168 hours. Coagulation Profile: Recent Labs  Lab 01/10/23 0252  INR 1.1   Cardiac Enzymes: No results for input(s): "CKTOTAL", "CKMB", "CKMBINDEX", "TROPONINI" in the last 168 hours. BNP (last 3 results) Recent Labs    08/23/22 1410 10/11/22 1513 10/24/22 1559  PROBNP 404* 764* 210   HbA1C: No results for input(s): "HGBA1C" in the last 72 hours. CBG: Recent Labs  Lab 01/10/23 1835  GLUCAP 220*   Lipid Profile: No results for input(s): "CHOL", "HDL", "LDLCALC", "TRIG", "CHOLHDL", "LDLDIRECT" in the last 72  hours. Thyroid Function Tests: Recent Labs    01/09/23 1755 01/09/23 1816  TSH  --  16.352*  FREET4 0.86  --    Anemia Panel: No results for input(s): "VITAMINB12", "FOLATE", "FERRITIN", "TIBC", "IRON", "RETICCTPCT" in the last 72 hours. Urine analysis:    Component Value Date/Time   COLORURINE YELLOW 01/10/2023 1510   APPEARANCEUR CLOUDY (A) 01/10/2023 1510   LABSPEC 1.015 01/10/2023 1510   PHURINE 5.0 01/10/2023 1510   GLUCOSEU 50 (A) 01/10/2023 1510   HGBUR MODERATE (A) 01/10/2023 1510   BILIRUBINUR NEGATIVE 01/10/2023 1510   KETONESUR NEGATIVE 01/10/2023 1510   PROTEINUR NEGATIVE 01/10/2023 1510   NITRITE POSITIVE (A) 01/10/2023 1510   LEUKOCYTESUR LARGE (A) 01/10/2023 1510   Sepsis Labs: @LABRCNTIP (procalcitonin:4,lacticidven:4)  ) Recent Results (from the past 240 hour(s))  Resp panel by RT-PCR (RSV, Flu A&B, Covid) Anterior Nasal Swab     Status: None   Collection Time: 01/09/23  6:16 PM   Specimen: Anterior Nasal Swab  Result Value Ref Range Status   SARS Coronavirus 2 by RT PCR NEGATIVE NEGATIVE Final   Influenza A by PCR NEGATIVE NEGATIVE Final   Influenza B by PCR NEGATIVE NEGATIVE Final    Comment: (NOTE) The Xpert Xpress SARS-CoV-2/FLU/RSV plus assay is intended as an aid in the diagnosis of influenza from Nasopharyngeal swab specimens and should not be used as a sole basis for treatment. Nasal washings and aspirates are unacceptable for Xpert Xpress SARS-CoV-2/FLU/RSV testing.  Fact Sheet for Patients: BloggerCourse.com  Fact Sheet for Healthcare Providers: SeriousBroker.it  This test is not yet approved or cleared by the [place] FDA and has been authorized for detection and/or diagnosis of SARS-CoV-2 by FDA under an Emergency Use Authorization (EUA). This EUA will remain in effect (meaning this test can be used) for the duration of the COVID-19 declaration under Section 564(b)(1) of the Act,  21 U.S.C. section 360bbb-3(b)(1), unless the authorization is terminated or revoked.     Resp Syncytial Virus by PCR NEGATIVE NEGATIVE Final    Comment: (NOTE) Fact Sheet for Patients: BloggerCourse.com  Fact Sheet for Healthcare Providers: SeriousBroker.it  This test is not yet approved or cleared by the [place] FDA and has been authorized for detection and/or diagnosis of SARS-CoV-2 by FDA under an Emergency Use Authorization (EUA). This EUA will remain in effect (meaning this test can be used) for the duration of the COVID-19 declaration under Section 564(b)(1) of the Act, 21 U.S.C. section 360bbb-3(b)(1), unless the authorization is terminated or revoked.  Performed at [facility], [address], [place], [place] [address]   Culture, blood (Routine X 2) w Reflex to ID Panel     Status: None (Preliminary result)   Collection Time: 01/09/23 11:10 PM   Specimen: BLOOD  Result Value Ref Range Status   Specimen Description BLOOD  BLOOD RIGHT HAND  Final   Special Requests   Final    BOTTLES DRAWN AEROBIC ONLY Blood Culture adequate volume   Culture   Final    NO GROWTH 2 DAYS Performed at [facility], [address] [place], [place] [address]    Report Status PENDING  Incomplete  Culture, blood (Routine X 2) w Reflex to ID Panel     Status: None (Preliminary result)   Collection Time: 01/10/23  2:49 AM   Specimen: BLOOD RIGHT HAND  Result Value Ref Range Status   Specimen Description BLOOD RIGHT HAND  Final   Special Requests   Final    BOTTLES DRAWN AEROBIC AND ANAEROBIC Blood Culture adequate volume   Culture   Final    NO GROWTH 1 DAY Performed at [facility], [address], [place], [place] [address]    Report Status PENDING  Incomplete     Radiology Studies: ECHOCARDIOGRAM COMPLETE  Result Date: 01/10/2023    ECHOCARDIOGRAM REPORT   Patient Name:   DOLPHINE SKALICKY Date of  Exam: 01/10/2023 Medical Rec #:  [id number]                 Height:       60.0 in Accession #:    [id number]                Weight:       278.8 lb Date of Birth:  11-06-1970                 BSA:          2.150 m Patient Age:    52 years                  BP:           149/67 mmHg Patient Gender: F                         HR:           82 bpm. Exam Location:  Inpatient Procedure: 2D Echo, Color Doppler and Cardiac Doppler Indications:    atrial fibrillation  History:        Patient has prior history of Echocardiogram examinations, most                 recent 04/05/2022. CHF and Cardiomyopathy, CAD, COPD and chronic                 kidney disease; Risk Factors:Hypertension and Dyslipidemia.  Sonographer:    Delcie Roch RDCS Referring Phys: [id number] [facility] GOEL  Sonographer Comments: Patient is obese. Image acquisition challenging due to patient body habitus. IMPRESSIONS  1. Left ventricular ejection fraction, by estimation, is 60 to 65%. The left ventricle has normal function. The left ventricle has no regional wall motion abnormalities. Left ventricular diastolic parameters are indeterminate.  2. Right ventricular systolic function is normal. The right ventricular size is normal. Tricuspid regurgitation signal is inadequate for assessing PA pressure.  3. The mitral valve is normal in structure. No evidence of mitral valve regurgitation.  4. The aortic valve was not well visualized. Aortic valve regurgitation is not visualized. Mild aortic valve stenosis. FINDINGS  Left Ventricle: Left ventricular ejection fraction, by estimation, is 60 to 65%. The left ventricle has normal function. The left ventricle has no regional wall motion abnormalities. The left ventricular internal cavity size was normal in size. There is  no left ventricular hypertrophy. Left ventricular diastolic parameters are indeterminate. Right Ventricle: The right ventricular size is normal. No increase in right ventricular wall thickness. Right  ventricular systolic function is normal. Tricuspid regurgitation signal is inadequate for assessing PA pressure. Left Atrium: Left atrial size was normal in size. Right Atrium: Right atrial size was normal in size. Pericardium: Trivial pericardial effusion is present. Presence of epicardial fat layer. Mitral Valve: The mitral valve is normal in structure. No evidence of mitral valve regurgitation. Tricuspid Valve: The tricuspid valve is normal in structure. Tricuspid valve regurgitation is trivial. Aortic Valve: The aortic valve was not well visualized. Aortic valve regurgitation is not visualized. Mild aortic stenosis is present. Aortic valve mean gradient measures 9.0 mmHg. Aortic valve peak gradient measures 16.0 mmHg. Aortic valve area, by VTI measures 1.33 cm. Pulmonic Valve: The pulmonic valve was not well visualized. Pulmonic valve regurgitation is not visualized. Aorta: The aortic root and ascending aorta are structurally normal, with no evidence of dilitation. IAS/Shunts: The interatrial septum was not well visualized.  LEFT VENTRICLE PLAX 2D LVIDd:         5.10 cm   Diastology LVIDs:         3.40 cm   LV e' medial:    7.72 cm/s LV PW:         1.00 cm   LV E/e' medial:  14.5 LV IVS:        1.00 cm   LV e' lateral:   7.94 cm/s LVOT diam:     1.70 cm   LV E/e' lateral: 14.1 LV SV:         53 LV SV Index:   25 LVOT Area:     2.27 cm  RIGHT VENTRICLE RV Basal diam:  2.40 cm RV S prime:     8.59 cm/s TAPSE (M-mode): 2.0 cm LEFT ATRIUM             Index        RIGHT ATRIUM           Index LA diam:        4.20 cm 1.95 cm/m   RA Area:     14.00 cm LA Vol (A2C):   36.3 ml 16.89 ml/m  RA Volume:   32.10 ml  14.93 ml/m LA Vol (A4C):   65.6 ml 30.52 ml/m LA Biplane Vol: 49.9 ml 23.21 ml/m  AORTIC VALVE AV Area (Vmax):    1.31 cm AV Area (Vmean):   1.30 cm AV Area (VTI):     1.33 cm AV Vmax:           200.00 cm/s AV Vmean:          137.500 cm/s AV VTI:            0.398 m AV Peak Grad:      16.0 mmHg AV Mean  Grad:      9.0 mmHg LVOT Vmax:         115.00 cm/s LVOT Vmean:        78.650 cm/s LVOT VTI:          0.233 m LVOT/AV VTI ratio: 0.59  AORTA Ao Root diam: 2.60 cm Ao Asc diam:  2.70 cm MITRAL VALVE MV Area (PHT): 4.39 cm     SHUNTS MV Decel Time: 173 msec     Systemic VTI:  0.23 m MV E velocity: 112.00 cm/s  Systemic Diam: 1.70 cm MV A velocity: 73.10 cm/s MV E/A ratio:  1.53 Epifanio Lesches MD Electronically signed by Epifanio Lesches MD Signature Date/Time: 01/10/2023/5:32:51 PM    Final    DG Chest Portable 1 View  Result Date: 01/09/2023 CLINICAL DATA:  Shortness of EXAM: PORTABLE CHEST 1 VIEW COMPARISON:  01/02/2023, 10/06/2022, CT 08/31/2022 FINDINGS: Prominent epicardial pericardial fat. Minimal atelectasis right base. Stable cardiomediastinal silhouette. No pneumothorax IMPRESSION: Minimal right base atelectasis. Electronically Signed   By: Jasmine Pang M.D.   On: 01/09/2023 18:42     Scheduled Meds:  aspirin EC  81 mg Oral Daily   atorvastatin  40 mg Oral Daily   colchicine  0.6 mg Oral QHS   diltiazem  240 mg Oral Daily   diltiazem  10 mg Intravenous Once   ezetimibe  10 mg Oral Daily   fluticasone furoate-vilanterol  1 puff Inhalation Daily   heparin  5,000 Units Subcutaneous Q8H   levothyroxine  200 mcg Oral q morning   loperamide  2 mg Oral Once   sodium chloride flush  3 mL Intravenous Q12H   umeclidinium bromide  1 puff Inhalation Daily   Continuous Infusions:  cefTRIAXone (ROCEPHIN)  IV 1 g (01/11/23 1026)   promethazine (PHENERGAN) injection (IM or IVPB)       LOS: 2 days    Time spent:    Zannie Cove, MD [facility]   01/11/2023, 11:58 AM

## 2023-01-11 NOTE — Progress Notes (Signed)
Cardiology Progress Note  Patient ID: Katie Rasmussen MRN: [id number] DOB: 27-Jul-1970 Date of Encounter: 01/11/2023  Primary Cardiologist: Gypsy Balsam, MD  Subjective   Chief Complaint: SOB  HPI: Leg pain improved with K and Mg, vomiting yesterday but otherwise she feels she is improving slowly. Tearful over her symptomatic SVT.  ROS:  All other ROS reviewed and negative. Pertinent positives noted in the HPI.     Inpatient Medications  Scheduled Meds:  aspirin EC  81 mg Oral Daily   atorvastatin  40 mg Oral Daily   colchicine  0.6 mg Oral QHS   diltiazem  240 mg Oral Daily   diltiazem  10 mg Intravenous Once   ezetimibe  10 mg Oral Daily   fluticasone furoate-vilanterol  1 puff Inhalation Daily   heparin  5,000 Units Subcutaneous Q8H   levothyroxine  200 mcg Oral q morning   loperamide  2 mg Oral Once   sodium chloride flush  3 mL Intravenous Q12H   umeclidinium bromide  1 puff Inhalation Daily   Continuous Infusions:  cefTRIAXone (ROCEPHIN)  IV     promethazine (PHENERGAN) injection (IM or IVPB)     PRN Meds: acetaminophen **OR** acetaminophen, albuterol, ALPRAZolam, diphenhydrAMINE, polyethylene glycol   Vital Signs   Vitals:   01/11/23 0439 01/11/23 0500 01/11/23 0736 01/11/23 0834  BP: (!) 113/45  127/71   Pulse: 80  81   Resp: 20  18   Temp: 97.7 F (36.5 C)  97.7 F (36.5 C)   TempSrc: Oral  Oral   SpO2: 94%  93% 97%  Weight:  126.7 kg    Height:        Intake/Output Summary (Last 24 hours) at 01/11/2023 0847 Last data filed at 01/11/2023 0810 Gross per 24 hour  Intake 1057.39 ml  Output 1502 ml  Net -444.61 ml      01/11/2023    5:00 AM 01/11/2023   12:36 AM 01/10/2023    5:00 AM  Last 3 Weights  Weight (lbs) 279 lb 4.8 oz 279 lb 4.8 oz 278 lb 13.1 oz  Weight (kg) 126.69 kg 126.69 kg 126.47 kg      Telemetry  Overnight telemetry shows sinus rhythm 80s, with episodic SVT/AT, which I personally reviewed.   ECG  The most recent ECG  shows sinus tachycardia heart rate 103, old anteroseptal infarct, which I personally reviewed.   Physical Exam   Vitals:   01/11/23 0439 01/11/23 0500 01/11/23 0736 01/11/23 0834  BP: (!) 113/45  127/71   Pulse: 80  81   Resp: 20  18   Temp: 97.7 F (36.5 C)  97.7 F (36.5 C)   TempSrc: Oral  Oral   SpO2: 94%  93% 97%  Weight:  126.7 kg    Height:        Intake/Output Summary (Last 24 hours) at 01/11/2023 0847 Last data filed at 01/11/2023 0810 Gross per 24 hour  Intake 1057.39 ml  Output 1502 ml  Net -444.61 ml       01/11/2023    5:00 AM 01/11/2023   12:36 AM 01/10/2023    5:00 AM  Last 3 Weights  Weight (lbs) 279 lb 4.8 oz 279 lb 4.8 oz 278 lb 13.1 oz  Weight (kg) 126.69 kg 126.69 kg 126.47 kg    Body mass index is 54.55 kg/m.   GEN: No acute distress.   Neck: JVD unable to assess due to body habitus Cardiac: RRR, no murmurs, rubs, or gallops.  Respiratory: Clear to auscultation bilaterally. GI: Soft, nontender, non-distended  MS: trace puffy edema; No deformity. Neuro:  Nonfocal  Psych: Normal affect    Labs  High Sensitivity Troponin:   Recent Labs  Lab 01/09/23 1755 01/09/23 1957  TROPONINIHS 14 17     Cardiac EnzymesNo results for input(s): "TROPONINI" in the last 168 hours. No results for input(s): "TROPIPOC" in the last 168 hours.  Chemistry Recent Labs  Lab 01/09/23 1755 01/10/23 0249 01/11/23 0017  NA 135 136 135  K 3.0* 3.1* 3.6  CL 94* 95* 98  CO2 25 24 25   GLUCOSE 161* 200* 201*  BUN 11 13 16   CREATININE 1.49* 1.65* 1.51*  CALCIUM 9.1 9.0 9.0  PROT 6.5  --   --   ALBUMIN 3.3*  --   --   AST 28  --   --   ALT 25  --   --   ALKPHOS 102  --   --   BILITOT 0.6  --   --   GFRNONAA 42* 37* 41*  ANIONGAP 16* 17* 12    Hematology Recent Labs  Lab 01/09/23 1755 01/10/23 0249 01/11/23 0017  WBC 16.8* 12.8* 12.8*  RBC 4.49 3.73* 3.71*  HGB 13.4 11.6* 11.4*  HCT 41.1 34.7* 34.9*  MCV 91.5 93.0 94.1  MCH 29.8 31.1 30.7  MCHC 32.6 33.4  32.7  RDW 14.1 14.2 14.2  PLT 352 293 255   BNP Recent Labs  Lab 01/09/23 1816  BNP 86.3    DDimer No results for input(s): "DDIMER" in the last 168 hours.   Radiology  ECHOCARDIOGRAM COMPLETE  Result Date: 01/10/2023    ECHOCARDIOGRAM REPORT   Patient Name:   Katie Rasmussen Date of Exam: 01/10/2023 Medical Rec #:  [id number]                 Height:       60.0 in Accession #:    [id number]                Weight:       278.8 lb Date of Birth:  [birth year]                 BSA:          2.150 m Patient Age:    52 years                  BP:           149/67 mmHg Patient Gender: F                         HR:           82 bpm. Exam Location:  Inpatient Procedure: 2D Echo, Color Doppler and Cardiac Doppler Indications:    atrial fibrillation  History:        Patient has prior history of Echocardiogram examinations, most                 recent 04/05/2022. CHF and Cardiomyopathy, CAD, COPD and chronic                 kidney disease; Risk Factors:Hypertension and Dyslipidemia.  Sonographer:    Delcie Roch RDCS Referring Phys: [id number] [facility] GOEL  Sonographer Comments: Patient is obese. Image acquisition challenging due to patient body habitus. IMPRESSIONS  1. Left ventricular ejection fraction, by estimation, is 60 to 65%. The left ventricle has normal function. The left  ventricle has no regional wall motion abnormalities. Left ventricular diastolic parameters are indeterminate.  2. Right ventricular systolic function is normal. The right ventricular size is normal. Tricuspid regurgitation signal is inadequate for assessing PA pressure.  3. The mitral valve is normal in structure. No evidence of mitral valve regurgitation.  4. The aortic valve was not well visualized. Aortic valve regurgitation is not visualized. Mild aortic valve stenosis. FINDINGS  Left Ventricle: Left ventricular ejection fraction, by estimation, is 60 to 65%. The left ventricle has normal function. The left ventricle has no  regional wall motion abnormalities. The left ventricular internal cavity size was normal in size. There is  no left ventricular hypertrophy. Left ventricular diastolic parameters are indeterminate. Right Ventricle: The right ventricular size is normal. No increase in right ventricular wall thickness. Right ventricular systolic function is normal. Tricuspid regurgitation signal is inadequate for assessing PA pressure. Left Atrium: Left atrial size was normal in size. Right Atrium: Right atrial size was normal in size. Pericardium: Trivial pericardial effusion is present. Presence of epicardial fat layer. Mitral Valve: The mitral valve is normal in structure. No evidence of mitral valve regurgitation. Tricuspid Valve: The tricuspid valve is normal in structure. Tricuspid valve regurgitation is trivial. Aortic Valve: The aortic valve was not well visualized. Aortic valve regurgitation is not visualized. Mild aortic stenosis is present. Aortic valve mean gradient measures 9.0 mmHg. Aortic valve peak gradient measures 16.0 mmHg. Aortic valve area, by VTI measures 1.33 cm. Pulmonic Valve: The pulmonic valve was not well visualized. Pulmonic valve regurgitation is not visualized. Aorta: The aortic root and ascending aorta are structurally normal, with no evidence of dilitation. IAS/Shunts: The interatrial septum was not well visualized.  LEFT VENTRICLE PLAX 2D LVIDd:         5.10 cm   Diastology LVIDs:         3.40 cm   LV e' medial:    7.72 cm/s LV PW:         1.00 cm   LV E/e' medial:  14.5 LV IVS:        1.00 cm   LV e' lateral:   7.94 cm/s LVOT diam:     1.70 cm   LV E/e' lateral: 14.1 LV SV:         53 LV SV Index:   25 LVOT Area:     2.27 cm  RIGHT VENTRICLE RV Basal diam:  2.40 cm RV S prime:     8.59 cm/s TAPSE (M-mode): 2.0 cm LEFT ATRIUM             Index        RIGHT ATRIUM           Index LA diam:        4.20 cm 1.95 cm/m   RA Area:     14.00 cm LA Vol (A2C):   36.3 ml 16.89 ml/m  RA Volume:   32.10 ml   14.93 ml/m LA Vol (A4C):   65.6 ml 30.52 ml/m LA Biplane Vol: 49.9 ml 23.21 ml/m  AORTIC VALVE AV Area (Vmax):    1.31 cm AV Area (Vmean):   1.30 cm AV Area (VTI):     1.33 cm AV Vmax:           200.00 cm/s AV Vmean:          137.500 cm/s AV VTI:            0.398 m AV Peak Grad:  16.0 mmHg AV Mean Grad:      9.0 mmHg LVOT Vmax:         115.00 cm/s LVOT Vmean:        78.650 cm/s LVOT VTI:          0.233 m LVOT/AV VTI ratio: 0.59  AORTA Ao Root diam: 2.60 cm Ao Asc diam:  2.70 cm MITRAL VALVE MV Area (PHT): 4.39 cm     SHUNTS MV Decel Time: 173 msec     Systemic VTI:  0.23 m MV E velocity: 112.00 cm/s  Systemic Diam: 1.70 cm MV A velocity: 73.10 cm/s MV E/A ratio:  1.53 Epifanio Lesches MD Electronically signed by Epifanio Lesches MD Signature Date/Time: 01/10/2023/5:32:51 PM    Final    DG Chest Portable 1 View  Result Date: 01/09/2023 CLINICAL DATA:  Shortness of EXAM: PORTABLE CHEST 1 VIEW COMPARISON:  01/02/2023, 10/06/2022, CT 08/31/2022 FINDINGS: Prominent epicardial pericardial fat. Minimal atelectasis right base. Stable cardiomediastinal silhouette. No pneumothorax IMPRESSION: Minimal right base atelectasis. Electronically Signed   By: Jasmine Pang M.D.   On: 01/09/2023 18:42    Cardiac Studies  ECHOCARDIOGRAM COMPLETE  Result Date: 01/10/2023    ECHOCARDIOGRAM REPORT   Patient Name:   Katie Rasmussen Date of Exam: 01/10/2023 Medical Rec #:  [id number]                 Height:       60.0 in Accession #:    [id number]                Weight:       278.8 lb Date of Birth:  08/13/1970                 BSA:          2.150 m Patient Age:    52 years                  BP:           149/67 mmHg Patient Gender: F                         HR:           82 bpm. Exam Location:  Inpatient Procedure: 2D Echo, Color Doppler and Cardiac Doppler Indications:    atrial fibrillation  History:        Patient has prior history of Echocardiogram examinations, most                 recent 04/05/2022. CHF  and Cardiomyopathy, CAD, COPD and chronic                 kidney disease; Risk Factors:Hypertension and Dyslipidemia.  Sonographer:    Delcie Roch RDCS Referring Phys: [id number] [place] GOEL  Sonographer Comments: Patient is obese. Image acquisition challenging due to patient body habitus. IMPRESSIONS  1. Left ventricular ejection fraction, by estimation, is 60 to 65%. The left ventricle has normal function. The left ventricle has no regional wall motion abnormalities. Left ventricular diastolic parameters are indeterminate.  2. Right ventricular systolic function is normal. The right ventricular size is normal. Tricuspid regurgitation signal is inadequate for assessing PA pressure.  3. The mitral valve is normal in structure. No evidence of mitral valve regurgitation.  4. The aortic valve was not well visualized. Aortic valve regurgitation is not visualized. Mild aortic valve stenosis. FINDINGS  Left Ventricle: Left ventricular ejection fraction, by estimation,  is 60 to 65%. The left ventricle has normal function. The left ventricle has no regional wall motion abnormalities. The left ventricular internal cavity size was normal in size. There is  no left ventricular hypertrophy. Left ventricular diastolic parameters are indeterminate. Right Ventricle: The right ventricular size is normal. No increase in right ventricular wall thickness. Right ventricular systolic function is normal. Tricuspid regurgitation signal is inadequate for assessing PA pressure. Left Atrium: Left atrial size was normal in size. Right Atrium: Right atrial size was normal in size. Pericardium: Trivial pericardial effusion is present. Presence of epicardial fat layer. Mitral Valve: The mitral valve is normal in structure. No evidence of mitral valve regurgitation. Tricuspid Valve: The tricuspid valve is normal in structure. Tricuspid valve regurgitation is trivial. Aortic Valve: The aortic valve was not well visualized. Aortic valve  regurgitation is not visualized. Mild aortic stenosis is present. Aortic valve mean gradient measures 9.0 mmHg. Aortic valve peak gradient measures 16.0 mmHg. Aortic valve area, by VTI measures 1.33 cm. Pulmonic Valve: The pulmonic valve was not well visualized. Pulmonic valve regurgitation is not visualized. Aorta: The aortic root and ascending aorta are structurally normal, with no evidence of dilitation. IAS/Shunts: The interatrial septum was not well visualized.  LEFT VENTRICLE PLAX 2D LVIDd:         5.10 cm   Diastology LVIDs:         3.40 cm   LV e' medial:    7.72 cm/s LV PW:         1.00 cm   LV E/e' medial:  14.5 LV IVS:        1.00 cm   LV e' lateral:   7.94 cm/s LVOT diam:     1.70 cm   LV E/e' lateral: 14.1 LV SV:         53 LV SV Index:   25 LVOT Area:     2.27 cm  RIGHT VENTRICLE RV Basal diam:  2.40 cm RV S prime:     8.59 cm/s TAPSE (M-mode): 2.0 cm LEFT ATRIUM             Index        RIGHT ATRIUM           Index LA diam:        4.20 cm 1.95 cm/m   RA Area:     14.00 cm LA Vol (A2C):   36.3 ml 16.89 ml/m  RA Volume:   32.10 ml  14.93 ml/m LA Vol (A4C):   65.6 ml 30.52 ml/m LA Biplane Vol: 49.9 ml 23.21 ml/m  AORTIC VALVE AV Area (Vmax):    1.31 cm AV Area (Vmean):   1.30 cm AV Area (VTI):     1.33 cm AV Vmax:           200.00 cm/s AV Vmean:          137.500 cm/s AV VTI:            0.398 m AV Peak Grad:      16.0 mmHg AV Mean Grad:      9.0 mmHg LVOT Vmax:         115.00 cm/s LVOT Vmean:        78.650 cm/s LVOT VTI:          0.233 m LVOT/AV VTI ratio: 0.59  AORTA Ao Root diam: 2.60 cm Ao Asc diam:  2.70 cm MITRAL VALVE MV Area (PHT): 4.39 cm     SHUNTS MV Decel  Time: 173 msec     Systemic VTI:  0.23 m MV E velocity: 112.00 cm/s  Systemic Diam: 1.70 cm MV A velocity: 73.10 cm/s MV E/A ratio:  1.53 Epifanio Lesches MD Electronically signed by Epifanio Lesches MD Signature Date/Time: 01/10/2023/5:32:51 PM    Final      Patient Profile  Katie Rasmussen is a 52 y.o. female  with CAD, pulmonary fibrosis, ischemic cardiomyopathy with recovered ejection fraction, CKD 3B admitted on 01/09/2023 for SVT.  Assessment & Plan   # SVT  - EP has seen. No Afib seen on review. EP to follow up in [place]. They recommend lifestyle intervention for candidacy for ablation. -echo independently reviewed. Preserved EF, likely grossly normal diastolic function, IVC poorly seen but not plethoric.  -Stopped metoprolol given lung disease.  Extended release diltiazem to 240 mg daily. Can use Prn dilt 30 mg for episodic symptomatic SVT. -TSH abnormal, addressed by hospital medicine.  # Shortness of breath # Pulmonary fibrosis # HFpEF -euvolemic.  Can restart home diuretic once potassium has normalized.  # CAD status post PCI -No symptoms of angina.  Troponins negative.  F/u has been arranged in [place].   For questions or updates, please contact [facility] Please consult [email or web address] for contact info under

## 2023-01-12 DIAGNOSIS — I471 Supraventricular tachycardia, unspecified: Secondary | ICD-10-CM | POA: Diagnosis not present

## 2023-01-12 LAB — CULTURE, BLOOD (ROUTINE X 2)

## 2023-01-12 LAB — BASIC METABOLIC PANEL
Anion gap: 18 — ABNORMAL HIGH (ref 5–15)
BUN: 12 mg/dL (ref 6–20)
CO2: 21 mmol/L — ABNORMAL LOW (ref 22–32)
Calcium: 9.2 mg/dL (ref 8.9–10.3)
Chloride: 97 mmol/L — ABNORMAL LOW (ref 98–111)
Creatinine, Ser: 1.44 mg/dL — ABNORMAL HIGH (ref 0.44–1.00)
GFR, Estimated: 44 mL/min — ABNORMAL LOW (ref >60)
Glucose, Bld: 163 mg/dL — ABNORMAL HIGH (ref 70–99)
Potassium: 3.5 mmol/L (ref 3.5–5.1)
Sodium: 136 mmol/L (ref 135–145)

## 2023-01-12 LAB — CBC
HCT: 33.9 % — ABNORMAL LOW (ref 36.0–46.0)
Hemoglobin: 11.2 g/dL — ABNORMAL LOW (ref 12.0–15.0)
MCH: 31 pg (ref 26.0–34.0)
MCHC: 33 g/dL (ref 30.0–36.0)
MCV: 93.9 fL (ref 80.0–100.0)
Platelets: 254 10*3/uL (ref 150–400)
RBC: 3.61 MIL/uL — ABNORMAL LOW (ref 3.87–5.11)
RDW: 14.3 % (ref 11.5–15.5)
WBC: 9.4 10*3/uL (ref 4.0–10.5)
nRBC: 0 % (ref 0.0–0.2)

## 2023-01-12 LAB — URIC ACID: Uric Acid, Serum: 11.6 mg/dL — ABNORMAL HIGH (ref 2.5–7.1)

## 2023-01-12 MED ORDER — PREDNISONE 20 MG PO TABS
40.0000 mg | ORAL_TABLET | Freq: Every day | ORAL | Status: DC
  Administered 2023-01-12 – 2023-01-13 (×2): 40 mg via ORAL
  Filled 2023-01-12 (×2): qty 2

## 2023-01-12 MED ORDER — ALLOPURINOL 100 MG PO TABS
100.0000 mg | ORAL_TABLET | Freq: Every day | ORAL | Status: DC
  Administered 2023-01-12 – 2023-01-13 (×2): 100 mg via ORAL
  Filled 2023-01-12 (×2): qty 1

## 2023-01-12 MED ORDER — COLCHICINE 0.6 MG PO TABS
0.6000 mg | ORAL_TABLET | Freq: Every day | ORAL | Status: DC
  Administered 2023-01-12: 0.6 mg via ORAL
  Filled 2023-01-12: qty 1

## 2023-01-12 MED ORDER — TORSEMIDE 20 MG PO TABS
20.0000 mg | ORAL_TABLET | Freq: Every day | ORAL | Status: DC
  Administered 2023-01-12 – 2023-01-13 (×2): 20 mg via ORAL
  Filled 2023-01-12 (×2): qty 1

## 2023-01-12 MED ORDER — ATORVASTATIN CALCIUM 40 MG PO TABS
40.0000 mg | ORAL_TABLET | Freq: Every day | ORAL | Status: DC
  Administered 2023-01-12: 40 mg via ORAL
  Filled 2023-01-12: qty 1

## 2023-01-12 NOTE — Plan of Care (Signed)

## 2023-01-12 NOTE — Progress Notes (Signed)
Patient became anxious about not taking her allopurinol, unsure of the reason it was stopped while in the hospital, RN notfied D. Julian Reil to relay to attending to possibly resume dose 7/5.

## 2023-01-12 NOTE — Progress Notes (Addendum)
PROGRESS NOTE    Katie Rasmussen  ZOX:[id number] DOB: 1971-02-11 DOA: 01/09/2023 PCP: [name], FNP  52/F with history of pulmonary fibrosis, CKD, ischemic cardiomyopathy with recovered EF, CAD, hypothyroidism presented to the ED with palpitations and feeling miserable, seen in [facility] twice, presented to [facility], ED, labs notable for hypokalemia creatinine 1.4, troponin 14, BNP 86, WBC 12.8, TSH 16, chest x-ray with basilar atelectasis   Subjective: -Complains of severe pain in her toe and ankle, concerned about gout flare  Assessment and Plan:  SVT  -Cards, EP consult appreciated -Now on Cardizem CD, dose increased to 240 Mg daily, had just 1 episode yesterday -Metoprolol discontinued, continue as needed diltiazem -Repeat echo unremarkable  Acute on chronic gout flare -Resume colchicine, add prednisone -Allopurinol resumed  Hypothyroidism -On Synthroid 175 mcg, reports compliance, TSH significantly elevated at 16 with normal free T4, will increase Synthroid dose further -Will need repeat TSH in 6 weeks  UTI -Continue ceftriaxone day 3, change to keflex at DC, unfortunately urine Cx was not sent from reflex  Chronic diastolic CHF -Last echo 9/23 with preserved EF, normal RV -Resume torsemide  Chronic pulmonary fibrosis -Follow-up with pulmonary  DVT prophylaxis: Heparin subcutaneous Code Status: Full code Family Communication: None present Disposition Plan: Home later today or in a.m.  Consultants:    Procedures:   Antimicrobials:    Objective: Vitals:   01/12/23 0429 01/12/23 0742 01/12/23 0810 01/12/23 0957  BP: (!) 132/57 (!) 116/59  138/63  Pulse: 87 90    Resp: 18 18    Temp: 97.9 F (36.6 C) 97.9 F (36.6 C)    TempSrc: Oral Oral    SpO2: 99% 98% 97%   Weight:      Height:        Intake/Output Summary (Last 24 hours) at 01/12/2023 1008 Last data filed at 01/12/2023 0045 Gross per 24 hour  Intake 340 ml  Output 400 ml   Net -60 ml   Filed Weights   01/11/23 0036 01/11/23 0500 01/12/23 0107  Weight: 126.7 kg 126.7 kg 135.3 kg    Examination:  General exam: Appears calm and comfortable  Respiratory system: Clear to auscultation Cardiovascular system: S1 & S2 heard, RRR.  Abd: nondistended, soft and nontender.Normal bowel sounds heard. Central nervous system: Alert and oriented. No focal neurological deficits. Extremities: Trace edema, severe tenderness and warmth of left and right MTP joints, left ankle joint Skin: No rashes Psychiatry:  Mood & affect appropriate.     Data Reviewed:   CBC: Recent Labs  Lab 01/09/23 1755 01/10/23 0249 01/11/23 0017 01/12/23 0038  WBC 16.8* 12.8* 12.8* 9.4  NEUTROABS 13.0*  --   --   --   HGB 13.4 11.6* 11.4* 11.2*  HCT 41.1 34.7* 34.9* 33.9*  MCV 91.5 93.0 94.1 93.9  PLT 352 293 255 254   Basic Metabolic Panel: Recent Labs  Lab 01/09/23 1755 01/10/23 0249 01/10/23 0252 01/11/23 0017 01/12/23 0038  NA 135 136  --  135 136  K 3.0* 3.1*  --  3.6 3.5  CL 94* 95*  --  98 97*  CO2 25 24  --  25 21*  GLUCOSE 161* 200*  --  201* 163*  BUN 11 13  --  16 12  CREATININE 1.49* 1.65*  --  1.51* 1.44*  CALCIUM 9.1 9.0  --  9.0 9.2  MG  --   --  1.8  --   --    GFR: Estimated  Creatinine Clearance: 58.7 mL/min (A) (by C-G formula based on SCr of 1.44 mg/dL (H)). Liver Function Tests: Recent Labs  Lab 01/09/23 1755  AST 28  ALT 25  ALKPHOS 102  BILITOT 0.6  PROT 6.5  ALBUMIN 3.3*   No results for input(s): "LIPASE", "AMYLASE" in the last 168 hours. No results for input(s): "AMMONIA" in the last 168 hours. Coagulation Profile: Recent Labs  Lab 01/10/23 0252  INR 1.1   Cardiac Enzymes: No results for input(s): "CKTOTAL", "CKMB", "CKMBINDEX", "TROPONINI" in the last 168 hours. BNP (last 3 results) Recent Labs    08/23/22 1410 10/11/22 1513 10/24/22 1559  PROBNP 404* 764* 210   HbA1C: No results for input(s): "HGBA1C" in the last 72  hours. CBG: Recent Labs  Lab 01/10/23 1835  GLUCAP 220*   Lipid Profile: No results for input(s): "CHOL", "HDL", "LDLCALC", "TRIG", "CHOLHDL", "LDLDIRECT" in the last 72 hours. Thyroid Function Tests: Recent Labs    01/09/23 1755 01/09/23 1816  TSH  --  16.352*  FREET4 0.86  --    Anemia Panel: No results for input(s): "VITAMINB12", "FOLATE", "FERRITIN", "TIBC", "IRON", "RETICCTPCT" in the last 72 hours. Urine analysis:    Component Value Date/Time   COLORURINE YELLOW 01/10/2023 1510   APPEARANCEUR CLOUDY (A) 01/10/2023 1510   LABSPEC 1.015 01/10/2023 1510   PHURINE 5.0 01/10/2023 1510   GLUCOSEU 50 (A) 01/10/2023 1510   HGBUR MODERATE (A) 01/10/2023 1510   BILIRUBINUR NEGATIVE 01/10/2023 1510   KETONESUR NEGATIVE 01/10/2023 1510   PROTEINUR NEGATIVE 01/10/2023 1510   NITRITE POSITIVE (A) 01/10/2023 1510   LEUKOCYTESUR LARGE (A) 01/10/2023 1510   Sepsis Labs: @LABRCNTIP (procalcitonin:4,lacticidven:4)  ) Recent Results (from the past 240 hour(s))  Resp panel by RT-PCR (RSV, Flu A&B, Covid) Anterior Nasal Swab     Status: None   Collection Time: 01/09/23  6:16 PM   Specimen: Anterior Nasal Swab  Result Value Ref Range Status   SARS Coronavirus 2 by RT PCR NEGATIVE NEGATIVE Final   Influenza A by PCR NEGATIVE NEGATIVE Final   Influenza B by PCR NEGATIVE NEGATIVE Final    Comment: (NOTE) The Xpert Xpress SARS-CoV-2/FLU/RSV plus assay is intended as an aid in the diagnosis of influenza from Nasopharyngeal swab specimens and should not be used as a sole basis for treatment. Nasal washings and aspirates are unacceptable for Xpert Xpress SARS-CoV-2/FLU/RSV testing.  Fact Sheet for Patients: BloggerCourse.com  Fact Sheet for Healthcare Providers: SeriousBroker.it  This test is not yet approved or cleared by the [place] FDA and has been authorized for detection and/or diagnosis of SARS-CoV-2 by FDA under an  Emergency Use Authorization (EUA). This EUA will remain in effect (meaning this test can be used) for the duration of the COVID-19 declaration under Section 564(b)(1) of the Act, 21 U.S.C. section 360bbb-3(b)(1), unless the authorization is terminated or revoked.     Resp Syncytial Virus by PCR NEGATIVE NEGATIVE Final    Comment: (NOTE) Fact Sheet for Patients: BloggerCourse.com  Fact Sheet for Healthcare Providers: SeriousBroker.it  This test is not yet approved or cleared by the [place] FDA and has been authorized for detection and/or diagnosis of SARS-CoV-2 by FDA under an Emergency Use Authorization (EUA). This EUA will remain in effect (meaning this test can be used) for the duration of the COVID-19 declaration under Section 564(b)(1) of the Act, 21 U.S.C. section 360bbb-3(b)(1), unless the authorization is terminated or revoked.  Performed at [facility], [address] [place], [place] [address]  Culture, blood (Routine X 2) w Reflex to ID Panel     Status: None (Preliminary result)   Collection Time: 01/09/23 11:10 PM   Specimen: BLOOD  Result Value Ref Range Status   Specimen Description BLOOD BLOOD RIGHT HAND  Final   Special Requests   Final    BOTTLES DRAWN AEROBIC ONLY Blood Culture adequate volume   Culture   Final    NO GROWTH 3 DAYS Performed at [facility], [address], [place], [place] [address]    Report Status PENDING  Incomplete  Culture, blood (Routine X 2) w Reflex to ID Panel     Status: None (Preliminary result)   Collection Time: 01/10/23  2:49 AM   Specimen: BLOOD RIGHT HAND  Result Value Ref Range Status   Specimen Description BLOOD RIGHT HAND  Final   Special Requests   Final    BOTTLES DRAWN AEROBIC AND ANAEROBIC Blood Culture adequate volume   Culture   Final    NO GROWTH 2 DAYS Performed at [facility], [address], [place], [place] [address]    Report  Status PENDING  Incomplete     Radiology Studies: ECHOCARDIOGRAM COMPLETE  Result Date: 01/10/2023    ECHOCARDIOGRAM REPORT   Patient Name:   MYCHAL PATCHETT Date of Exam: 01/10/2023 Medical Rec #:  [id number]                 Height:       60.0 in Accession #:    [id number]                Weight:       278.8 lb Date of Birth:  01-21-1971                 BSA:          2.150 m Patient Age:    52 years                  BP:           149/67 mmHg Patient Gender: F                         HR:           82 bpm. Exam Location:  Inpatient Procedure: 2D Echo, Color Doppler and Cardiac Doppler Indications:    atrial fibrillation  History:        Patient has prior history of Echocardiogram examinations, most                 recent 04/05/2022. CHF and Cardiomyopathy, CAD, COPD and chronic                 kidney disease; Risk Factors:Hypertension and Dyslipidemia.  Sonographer:    Delcie Roch RDCS Referring Phys: [id number] [facility] GOEL  Sonographer Comments: Patient is obese. Image acquisition challenging due to patient body habitus. IMPRESSIONS  1. Left ventricular ejection fraction, by estimation, is 60 to 65%. The left ventricle has normal function. The left ventricle has no regional wall motion abnormalities. Left ventricular diastolic parameters are indeterminate.  2. Right ventricular systolic function is normal. The right ventricular size is normal. Tricuspid regurgitation signal is inadequate for assessing PA pressure.  3. The mitral valve is normal in structure. No evidence of mitral valve regurgitation.  4. The aortic valve was not well visualized. Aortic valve regurgitation is not visualized. Mild aortic valve stenosis. FINDINGS  Left Ventricle: Left ventricular ejection fraction, by estimation, is 60 to 65%. The left ventricle has normal function. The left ventricle has no regional wall motion abnormalities. The left ventricular internal cavity size was normal in size. There is  no left ventricular  hypertrophy. Left ventricular diastolic parameters are indeterminate. Right Ventricle: The right ventricular size is normal. No increase in right ventricular wall thickness. Right ventricular systolic function is normal. Tricuspid regurgitation signal is inadequate for assessing PA pressure. Left Atrium: Left atrial size was normal in size. Right Atrium: Right atrial size was normal in size. Pericardium: Trivial pericardial effusion is present. Presence of epicardial fat layer. Mitral Valve: The mitral valve is normal in structure. No evidence of mitral valve regurgitation. Tricuspid Valve: The tricuspid valve is normal in structure. Tricuspid valve regurgitation is trivial. Aortic Valve: The aortic valve was not well visualized. Aortic valve regurgitation is not visualized. Mild aortic stenosis is present. Aortic valve mean gradient measures 9.0 mmHg. Aortic valve peak gradient measures 16.0 mmHg. Aortic valve area, by VTI measures 1.33 cm. Pulmonic Valve: The pulmonic valve was not well visualized. Pulmonic valve regurgitation is not visualized. Aorta: The aortic root and ascending aorta are structurally normal, with no evidence of dilitation. IAS/Shunts: The interatrial septum was not well visualized.  LEFT VENTRICLE PLAX 2D LVIDd:         5.10 cm   Diastology LVIDs:         3.40 cm   LV e' medial:    7.72 cm/s LV PW:         1.00 cm   LV E/e' medial:  14.5 LV IVS:        1.00 cm   LV e' lateral:   7.94 cm/s LVOT diam:     1.70 cm   LV E/e' lateral: 14.1 LV SV:         53 LV SV Index:   25 LVOT Area:     2.27 cm  RIGHT VENTRICLE RV Basal diam:  2.40 cm RV S prime:     8.59 cm/s TAPSE (M-mode): 2.0 cm LEFT ATRIUM             Index        RIGHT ATRIUM           Index LA diam:        4.20 cm 1.95 cm/m   RA Area:     14.00 cm LA Vol (A2C):   36.3 ml 16.89 ml/m  RA Volume:   32.10 ml  14.93 ml/m LA Vol (A4C):   65.6 ml 30.52 ml/m LA Biplane Vol: 49.9 ml 23.21 ml/m  AORTIC VALVE AV Area (Vmax):    1.31 cm AV  Area (Vmean):   1.30 cm AV Area (VTI):     1.33 cm AV Vmax:           200.00 cm/s AV Vmean:          137.500 cm/s AV VTI:            0.398 m AV Peak Grad:      16.0 mmHg AV Mean Grad:      9.0 mmHg LVOT Vmax:         115.00 cm/s LVOT Vmean:        78.650 cm/s LVOT VTI:          0.233 m LVOT/AV VTI ratio: 0.59  AORTA Ao Root diam: 2.60 cm Ao Asc diam:  2.70 cm MITRAL VALVE MV Area (PHT):  4.39 cm     SHUNTS MV Decel Time: 173 msec     Systemic VTI:  0.23 m MV E velocity: 112.00 cm/s  Systemic Diam: 1.70 cm MV A velocity: 73.10 cm/s MV E/A ratio:  1.53 Epifanio Lesches MD Electronically signed by Epifanio Lesches MD Signature Date/Time: 01/10/2023/5:32:51 PM    Final      Scheduled Meds:  allopurinol  100 mg Oral Daily   aspirin EC  81 mg Oral Daily   atorvastatin  40 mg Oral QHS   colchicine  0.6 mg Oral QHS   colchicine  0.6 mg Oral Daily   diltiazem  240 mg Oral Daily   diltiazem  10 mg Intravenous Once   ezetimibe  10 mg Oral Daily   fluticasone furoate-vilanterol  1 puff Inhalation Daily   heparin  5,000 Units Subcutaneous Q8H   levothyroxine  200 mcg Oral q morning   loperamide  2 mg Oral Once   predniSONE  40 mg Oral Q breakfast   sodium chloride flush  3 mL Intravenous Q12H   umeclidinium bromide  1 puff Inhalation Daily   Continuous Infusions:  cefTRIAXone (ROCEPHIN)  IV 1 g (01/12/23 1007)   promethazine (PHENERGAN) injection (IM or IVPB)       LOS: 3 days    Time spent:    Zannie Cove, MD [facility]   01/12/2023, 10:08 AM

## 2023-01-12 NOTE — Progress Notes (Signed)
Cardiology Progress Note  Patient ID: Katie Rasmussen MRN: [id number] DOB: [birth year] Date of Encounter: 01/12/2023  Primary Cardiologist: Gypsy Balsam, MD  Subjective   Chief Complaint: Gout  HPI: Reports she is having a gout flare.  Brief SVT overnight.  Well-controlled on diltiazem.  ROS:  All other ROS reviewed and negative. Pertinent positives noted in the HPI.     Inpatient Medications  Scheduled Meds:  allopurinol  100 mg Oral Daily   aspirin EC  81 mg Oral Daily   atorvastatin  40 mg Oral QHS   colchicine  0.6 mg Oral QHS   colchicine  0.6 mg Oral Daily   diltiazem  240 mg Oral Daily   diltiazem  10 mg Intravenous Once   ezetimibe  10 mg Oral Daily   fluticasone furoate-vilanterol  1 puff Inhalation Daily   heparin  5,000 Units Subcutaneous Q8H   levothyroxine  200 mcg Oral q morning   loperamide  2 mg Oral Once   predniSONE  40 mg Oral Q breakfast   sodium chloride flush  3 mL Intravenous Q12H   umeclidinium bromide  1 puff Inhalation Daily   Continuous Infusions:  cefTRIAXone (ROCEPHIN)  IV Stopped (01/11/23 1056)   promethazine (PHENERGAN) injection (IM or IVPB)     PRN Meds: acetaminophen **OR** acetaminophen, albuterol, ALPRAZolam, diphenhydrAMINE, polyethylene glycol   Vital Signs   Vitals:   01/12/23 0107 01/12/23 0429 01/12/23 0742 01/12/23 0810  BP: (!) 117/47 (!) 132/57 (!) 116/59   Pulse: 82 87 90   Resp: 18 18 18    Temp: 98.1 F (36.7 C) 97.9 F (36.6 C) 97.9 F (36.6 C)   TempSrc: Oral Oral Oral   SpO2: 99% 99% 98% 97%  Weight: 135.3 kg     Height:        Intake/Output Summary (Last 24 hours) at 01/12/2023 0852 Last data filed at 01/12/2023 0045 Gross per 24 hour  Intake 340 ml  Output 600 ml  Net -260 ml      01/12/2023    1:07 AM 01/11/2023    5:00 AM 01/11/2023   12:36 AM  Last 3 Weights  Weight (lbs) 298 lb 4.5 oz 279 lb 4.8 oz 279 lb 4.8 oz  Weight (kg) 135.3 kg 126.69 kg 126.69 kg      Telemetry  Overnight  telemetry shows sinus rhythm 80 to 90 bpm, which I personally reviewed.   Physical Exam   Vitals:   01/12/23 0107 01/12/23 0429 01/12/23 0742 01/12/23 0810  BP: (!) 117/47 (!) 132/57 (!) 116/59   Pulse: 82 87 90   Resp: 18 18 18    Temp: 98.1 F (36.7 C) 97.9 F (36.6 C) 97.9 F (36.6 C)   TempSrc: Oral Oral Oral   SpO2: 99% 99% 98% 97%  Weight: 135.3 kg     Height:        Intake/Output Summary (Last 24 hours) at 01/12/2023 0852 Last data filed at 01/12/2023 0045 Gross per 24 hour  Intake 340 ml  Output 600 ml  Net -260 ml       01/12/2023    1:07 AM 01/11/2023    5:00 AM 01/11/2023   12:36 AM  Last 3 Weights  Weight (lbs) 298 lb 4.5 oz 279 lb 4.8 oz 279 lb 4.8 oz  Weight (kg) 135.3 kg 126.69 kg 126.69 kg    Body mass index is 58.25 kg/m.  General: Well nourished, well developed, in no acute distress Head: Atraumatic, normal size  Eyes:  PEERLA, EOMI  Neck: Supple, no JVD Endocrine: No thryomegaly Cardiac: Normal S1, S2; RRR; no murmurs, rubs, or gallops Lungs: Clear to auscultation bilaterally, no wheezing, rhonchi or rales  Abd: Soft, nontender, no hepatomegaly  Ext: No edema, pulses 2+ Musculoskeletal: No deformities, BUE and BLE strength normal and equal Skin: Warm and dry, no rashes   Neuro: Alert and oriented to person, place, time, and situation, CNII-XII grossly intact, no focal deficits  Psych: Normal mood and affect   Labs  High Sensitivity Troponin:   Recent Labs  Lab 01/09/23 1755 01/09/23 1957  TROPONINIHS 14 17     Cardiac EnzymesNo results for input(s): "TROPONINI" in the last 168 hours. No results for input(s): "TROPIPOC" in the last 168 hours.  Chemistry Recent Labs  Lab 01/09/23 1755 01/10/23 0249 01/11/23 0017 01/12/23 0038  NA 135 136 135 136  K 3.0* 3.1* 3.6 3.5  CL 94* 95* 98 97*  CO2 25 24 25  21*  GLUCOSE 161* 200* 201* 163*  BUN 11 13 16 12   CREATININE 1.49* 1.65* 1.51* 1.44*  CALCIUM 9.1 9.0 9.0 9.2  PROT 6.5  --   --   --    ALBUMIN 3.3*  --   --   --   AST 28  --   --   --   ALT 25  --   --   --   ALKPHOS 102  --   --   --   BILITOT 0.6  --   --   --   GFRNONAA 42* 37* 41* 44*  ANIONGAP 16* 17* 12 18*    Hematology Recent Labs  Lab 01/10/23 0249 01/11/23 0017 01/12/23 0038  WBC 12.8* 12.8* 9.4  RBC 3.73* 3.71* 3.61*  HGB 11.6* 11.4* 11.2*  HCT 34.7* 34.9* 33.9*  MCV 93.0 94.1 93.9  MCH 31.1 30.7 31.0  MCHC 33.4 32.7 33.0  RDW 14.2 14.2 14.3  PLT 293 255 254   BNP Recent Labs  Lab 01/09/23 1816  BNP 86.3    DDimer No results for input(s): "DDIMER" in the last 168 hours.   Radiology  ECHOCARDIOGRAM COMPLETE  Result Date: 01/10/2023    ECHOCARDIOGRAM REPORT   Patient Name:   Katie Rasmussen Date of Exam: 01/10/2023 Medical Rec #:  [id number]                 Height:       60.0 in Accession #:    [id number]                Weight:       278.8 lb Date of Birth:  09-05-1970                 BSA:          2.150 m Patient Age:    52 years                  BP:           149/67 mmHg Patient Gender: F                         HR:           82 bpm. Exam Location:  Inpatient Procedure: 2D Echo, Color Doppler and Cardiac Doppler Indications:    atrial fibrillation  History:        Patient has prior history of Echocardiogram examinations, most  recent 04/05/2022. CHF and Cardiomyopathy, CAD, COPD and chronic                 kidney disease; Risk Factors:Hypertension and Dyslipidemia.  Sonographer:    Delcie Roch RDCS Referring Phys: [id number] [facility] GOEL  Sonographer Comments: Patient is obese. Image acquisition challenging due to patient body habitus. IMPRESSIONS  1. Left ventricular ejection fraction, by estimation, is 60 to 65%. The left ventricle has normal function. The left ventricle has no regional wall motion abnormalities. Left ventricular diastolic parameters are indeterminate.  2. Right ventricular systolic function is normal. The right ventricular size is normal. Tricuspid  regurgitation signal is inadequate for assessing PA pressure.  3. The mitral valve is normal in structure. No evidence of mitral valve regurgitation.  4. The aortic valve was not well visualized. Aortic valve regurgitation is not visualized. Mild aortic valve stenosis. FINDINGS  Left Ventricle: Left ventricular ejection fraction, by estimation, is 60 to 65%. The left ventricle has normal function. The left ventricle has no regional wall motion abnormalities. The left ventricular internal cavity size was normal in size. There is  no left ventricular hypertrophy. Left ventricular diastolic parameters are indeterminate. Right Ventricle: The right ventricular size is normal. No increase in right ventricular wall thickness. Right ventricular systolic function is normal. Tricuspid regurgitation signal is inadequate for assessing PA pressure. Left Atrium: Left atrial size was normal in size. Right Atrium: Right atrial size was normal in size. Pericardium: Trivial pericardial effusion is present. Presence of epicardial fat layer. Mitral Valve: The mitral valve is normal in structure. No evidence of mitral valve regurgitation. Tricuspid Valve: The tricuspid valve is normal in structure. Tricuspid valve regurgitation is trivial. Aortic Valve: The aortic valve was not well visualized. Aortic valve regurgitation is not visualized. Mild aortic stenosis is present. Aortic valve mean gradient measures 9.0 mmHg. Aortic valve peak gradient measures 16.0 mmHg. Aortic valve area, by VTI measures 1.33 cm. Pulmonic Valve: The pulmonic valve was not well visualized. Pulmonic valve regurgitation is not visualized. Aorta: The aortic root and ascending aorta are structurally normal, with no evidence of dilitation. IAS/Shunts: The interatrial septum was not well visualized.  LEFT VENTRICLE PLAX 2D LVIDd:         5.10 cm   Diastology LVIDs:         3.40 cm   LV e' medial:    7.72 cm/s LV PW:         1.00 cm   LV E/e' medial:  14.5 LV IVS:         1.00 cm   LV e' lateral:   7.94 cm/s LVOT diam:     1.70 cm   LV E/e' lateral: 14.1 LV SV:         53 LV SV Index:   25 LVOT Area:     2.27 cm  RIGHT VENTRICLE RV Basal diam:  2.40 cm RV S prime:     8.59 cm/s TAPSE (M-mode): 2.0 cm LEFT ATRIUM             Index        RIGHT ATRIUM           Index LA diam:        4.20 cm 1.95 cm/m   RA Area:     14.00 cm LA Vol (A2C):   36.3 ml 16.89 ml/m  RA Volume:   32.10 ml  14.93 ml/m LA Vol (A4C):   65.6 ml 30.52 ml/m LA Biplane Vol:  49.9 ml 23.21 ml/m  AORTIC VALVE AV Area (Vmax):    1.31 cm AV Area (Vmean):   1.30 cm AV Area (VTI):     1.33 cm AV Vmax:           200.00 cm/s AV Vmean:          137.500 cm/s AV VTI:            0.398 m AV Peak Grad:      16.0 mmHg AV Mean Grad:      9.0 mmHg LVOT Vmax:         115.00 cm/s LVOT Vmean:        78.650 cm/s LVOT VTI:          0.233 m LVOT/AV VTI ratio: 0.59  AORTA Ao Root diam: 2.60 cm Ao Asc diam:  2.70 cm MITRAL VALVE MV Area (PHT): 4.39 cm     SHUNTS MV Decel Time: 173 msec     Systemic VTI:  0.23 m MV E velocity: 112.00 cm/s  Systemic Diam: 1.70 cm MV A velocity: 73.10 cm/s MV E/A ratio:  1.53 Epifanio Lesches MD Electronically signed by Epifanio Lesches MD Signature Date/Time: 01/10/2023/5:32:51 PM    Final     Cardiac Studies  TTE 01/10/2023  1. Left ventricular ejection fraction, by estimation, is 60 to 65%. The  left ventricle has normal function. The left ventricle has no regional  wall motion abnormalities. Left ventricular diastolic parameters are  indeterminate.   2. Right ventricular systolic function is normal. The right ventricular  size is normal. Tricuspid regurgitation signal is inadequate for assessing  PA pressure.   3. The mitral valve is normal in structure. No evidence of mitral valve  regurgitation.   4. The aortic valve was not well visualized. Aortic valve regurgitation  is not visualized. Mild aortic valve stenosis.   Patient Profile  Katie Rasmussen is a 52  y.o. female with CAD, pulmonary fibrosis, ischemic cardiomyopathy with recovered ejection fraction, CKD 3B admitted on 01/09/2023 for SVT.   Assessment & Plan   # SVT -Well-controlled on diltiazem 240 mg daily.  Should continue this.  Can take short acting 30 mg as needed every 2 hours for heart rate above 110 bpm. -Overall she seems to be stable.  Okay for outpatient follow-up.  # HFpEF -Euvolemic.  Resume home medications.  # CAD status post PCI -No angina.  No change in medications.  [facility] will sign off.   Medication Recommendations: Diltiazem as above Other recommendations (labs, testing, etc): None Follow up as an outpatient: She has outpatient follow-up on 01/25/2023 in [place]  For questions or updates, please contact [facility] Please consult [email or web address] for contact info under        Signed, Gerri Spore T. Flora Lipps, MD, [facility]  [facility]  01/12/2023 8:52 AM

## 2023-01-13 DIAGNOSIS — I471 Supraventricular tachycardia, unspecified: Secondary | ICD-10-CM | POA: Diagnosis not present

## 2023-01-13 LAB — BASIC METABOLIC PANEL
Anion gap: 13 (ref 5–15)
BUN: 14 mg/dL (ref 6–20)
CO2: 23 mmol/L (ref 22–32)
Calcium: 9 mg/dL (ref 8.9–10.3)
Chloride: 99 mmol/L (ref 98–111)
Creatinine, Ser: 1.75 mg/dL — ABNORMAL HIGH (ref 0.44–1.00)
GFR, Estimated: 35 mL/min — ABNORMAL LOW (ref >60)
Glucose, Bld: 227 mg/dL — ABNORMAL HIGH (ref 70–99)
Potassium: 4 mmol/L (ref 3.5–5.1)
Sodium: 135 mmol/L (ref 135–145)

## 2023-01-13 LAB — CBC
HCT: 33.7 % — ABNORMAL LOW (ref 36.0–46.0)
Hemoglobin: 11 g/dL — ABNORMAL LOW (ref 12.0–15.0)
MCH: 30 pg (ref 26.0–34.0)
MCHC: 32.6 g/dL (ref 30.0–36.0)
MCV: 91.8 fL (ref 80.0–100.0)
Platelets: 258 10*3/uL (ref 150–400)
RBC: 3.67 MIL/uL — ABNORMAL LOW (ref 3.87–5.11)
RDW: 14.1 % (ref 11.5–15.5)
WBC: 11.7 10*3/uL — ABNORMAL HIGH (ref 4.0–10.5)
nRBC: 0 % (ref 0.0–0.2)

## 2023-01-13 MED ORDER — DILTIAZEM HCL ER COATED BEADS 240 MG PO CP24: 240.0000 mg | ORAL_CAPSULE | Freq: Every day | ORAL | 0 refills | Status: DC

## 2023-01-13 MED ORDER — COLCHICINE 0.6 MG PO TABS
0.6000 mg | ORAL_TABLET | Freq: Two times a day (BID) | ORAL | Status: DC
  Administered 2023-01-13: 0.6 mg via ORAL
  Filled 2023-01-13: qty 1

## 2023-01-13 MED ORDER — CEPHALEXIN 250 MG PO CAPS: 250.0000 mg | ORAL_CAPSULE | Freq: Three times a day (TID) | ORAL | 0 refills | Status: AC

## 2023-01-13 MED ORDER — METOLAZONE 2.5 MG PO TABS: 2.5000 mg | ORAL_TABLET | ORAL | Status: AC

## 2023-01-13 MED ORDER — LEVOTHYROXINE SODIUM 200 MCG PO TABS: 200.0000 ug | ORAL_TABLET | Freq: Every morning | ORAL | 0 refills | Status: AC

## 2023-01-13 MED ORDER — COLCHICINE 0.6 MG PO TABS: 0.6000 mg | ORAL_TABLET | Freq: Every evening | ORAL | Status: AC

## 2023-01-13 MED ORDER — PREDNISONE 20 MG PO TABS: 20.0000 mg | ORAL_TABLET | Freq: Every day | ORAL | 0 refills | Status: AC

## 2023-01-13 NOTE — Discharge Summary (Signed)
Physician Discharge Summary  Katie Rasmussen [facility] ZOX:[id number] DOB: 22-Apr-1971 DOA: 01/09/2023  PCP: Katie Jack, FNP  Admit date: 01/09/2023 Discharge date: 01/13/2023  Time spent: 35 minutes  Recommendations for Outpatient Follow-up:  Follow-up with cardiology Dr. Bing Rasmussen 7/18 PCP in 1 week, please check BMP at follow-up   Discharge Diagnoses:  Principal Problem:   Supraventricular tachycardia Active Problems:   Coronary artery disease drug-eluting stent implanted to proximal LAD in March 2019 in face of acute myocardial infarction   Leukocytosis   Systolic CHF with reduced left ventricular function, NYHA class 3 (HCC)   A-fib (HCC) UTI Gout flare  Discharge Condition: Improved  Diet recommendation: Low-sodium, heart healthy  Filed Weights   01/11/23 0500 01/12/23 0107 01/13/23 0506  Weight: 126.7 kg 135.3 kg (!) 137.1 kg    History of present illness:  52/F with history of pulmonary fibrosis, CKD, ischemic cardiomyopathy with recovered EF, CAD, hypothyroidism presented to the ED with palpitations and feeling miserable, seen in [place] ER twice, presented to [facility], ED, labs notable for hypokalemia creatinine 1.4, troponin 14, BNP 86, WBC 12.8, TSH 16, chest x-ray with basilar atelectasis    Hospital Course:   SVT  -Cards, EP consult appreciated -Now on Cardizem CD, dose increased to 240 Mg daily, no further episodes of SVT -Metoprolol discontinued, continue PRN diltiazem -Repeat echo with a EF of 60-65%, normal RV, no significant valvular heart disease   Acute on chronic gout flare -Resumed colchicine, prednisone taper added for 3 days, clinically improving -Allopurinol resumed   Hypothyroidism -On Synthroid 175 mcg, reports compliance, TSH significantly elevated at 16 with normal free T4, will increase Synthroid dose to 200 mcg daily -Will need repeat TSH in 6 weeks   UTI -Continue ceftriaxone day 3, change to keflex at DC, unfortunately  urine Cx was not sent from reflex   Chronic diastolic CHF -Last echo 9/23 with preserved EF, normal RV -Resume torsemide, Coreg, metolazone dose decreased to once a week from twice a week   Chronic pulmonary fibrosis -Follow-up with pulmonary  Chronic respiratory failure, OSA -On CPAP, O2 at bedtime  Obesity BMI is 59 -Needs lifestyle modification  Discharge Exam: Vitals:   01/13/23 0810 01/13/23 0856  BP: 127/62   Pulse: 86   Resp: 20   Temp:    SpO2: 100% 98%   Gen: Awake, Alert, Oriented X 3, morbidly obese HEENT: no JVD Lungs: Good air movement bilaterally, CTAB CVS: S1S2/RRR Abd: soft, Non tender, non distended, BS present Extremities: No edema Skin: no new rashes on exposed skin   Discharge Instructions   Discharge Instructions     Diet - low sodium heart healthy   Complete by: As directed    Increase activity slowly   Complete by: As directed       Allergies as of 01/13/2023       Reactions   Enoxaparin Sodium Other (See Comments)   Patient stated it was added to her chart in 2019 after her MI. She stated she was not alert enough to remember why it was added   Iodine Hives, Itching   Sulfa Antibiotics Other (See Comments)   Shellfish Allergy Hives, Itching        Medication List     STOP taking these medications    methylPREDNISolone 4 MG Tbpk tablet Commonly known as: MEDROL DOSEPAK   metoprolol succinate 25 MG 24 hr tablet Commonly known as: TOPROL-XL   metoprolol tartrate 25 MG tablet Commonly known as: [employer]  TAKE these medications    acetaminophen 500 MG tablet Commonly known as: TYLENOL Take 1,000 mg by mouth every 6 (six) hours as needed for mild pain or moderate pain.   albuterol (2.5 MG/3ML) 0.083% nebulizer solution Commonly known as: PROVENTIL Take 3 mLs (2.5 mg total) by nebulization every 6 (six) hours as needed for wheezing or shortness of breath. What changed: Another medication with the same name was  changed. Make sure you understand how and when to take each.   Ventolin HFA 108 (90 Base) MCG/ACT inhaler Generic drug: albuterol INHALE 2 PUFFS BY MOUTH EVERY 4 HOURS ASNEEDED FOR SHORTNESS OF BREATH. What changed:  how much to take how to take this when to take this reasons to take this   allopurinol 100 MG tablet Commonly known as: ZYLOPRIM Take 1 tablet (100 mg total) by mouth daily.   ALPRAZolam 0.25 MG tablet Commonly known as: XANAX Take 1 tablet (0.25 mg total) by mouth at bedtime as needed for anxiety. What changed: when to take this   aspirin EC 81 MG tablet Take 1 tablet (81 mg total) by mouth daily.   atorvastatin 40 MG tablet Commonly known as: LIPITOR Take 1 tablet (40 mg total) by mouth daily.   Breo Ellipta 200-25 MCG/ACT Aepb Generic drug: fluticasone furoate-vilanterol Inhale 1 puff into the lungs daily.   cephALEXin 250 MG capsule Commonly known as: KEFLEX Take 1 capsule (250 mg total) by mouth 3 (three) times daily for 2 days.   clopidogrel 75 MG tablet Commonly known as: PLAVIX TAKE 1 TABLET BY MOUTH ONCE DAILY WITH BREAKFAST. What changed: when to take this   colchicine 0.6 MG tablet Take 1 tablet (0.6 mg total) by mouth at bedtime. What changed:  when to take this Another medication with the same name was removed. Continue taking this medication, and follow the directions you see here.   diltiazem 240 MG 24 hr capsule Commonly known as: CARDIZEM CD Take 1 capsule (240 mg total) by mouth daily.   diltiazem 30 MG tablet Commonly known as: CARDIZEM Take 1 tablet (30 mg total) by mouth every 6 (six) hours as needed FOR PALPITATIONS What changed: See the new instructions.   levothyroxine 200 MCG tablet Commonly known as: SYNTHROID Take 1 tablet (200 mcg total) by mouth every morning. Start taking on: January 14, 2023 What changed:  medication strength how much to take additional instructions   metolazone 2.5 MG tablet Commonly known as:  ZAROXOLYN Take 1 tablet (2.5 mg total) by mouth once a week. What changed: when to take this   potassium chloride 10 MEQ tablet Commonly known as: KLOR-CON Take 1 tablet (10 mEq total) by mouth daily.   predniSONE 20 MG tablet Commonly known as: DELTASONE Take 1 tablet (20 mg total) by mouth daily with breakfast for 3 days. Start taking on: January 14, 2023 What changed:  medication strength how much to take   Spiriva Respimat 1.25 MCG/ACT Aers Generic drug: Tiotropium Bromide Monohydrate Inhale 1 each into the lungs daily.   torsemide 20 MG tablet Commonly known as: DEMADEX Take 1 tablet (20 mg total) by mouth 2 (two) times daily.   Vitamin D (Ergocalciferol) 1.25 MG (50000 UNIT) Caps capsule Commonly known as: DRISDOL Take 50,000 Units by mouth once a week.       Allergies  Allergen Reactions   Enoxaparin Sodium Other (See Comments)    Patient stated it was added to her chart in 2019 after her MI. She stated she  was not alert enough to remember why it was added   Iodine Hives and Itching   Sulfa Antibiotics Other (See Comments)   Shellfish Allergy Hives and Itching    Follow-up Information     Flossie Dibble, NP Follow up on 01/25/2023.   Specialty: Cardiology Why: Office is not at [facility] > SAME AS Dr. Bing Rasmussen  Appt at 3:30 PM. Please arrive at 3:15 Contact information: [address], [address] [place] [address] [phone]         Regan Lemming, MD Follow up on 02/12/2023.   Specialty: Cardiology Why: Appt at 12:30, please arrive at 12:15 for check in Contact information: [address] [place] [address] [phone]         [facility] Follow up.   Why: Agency will call you with apt times  [phone]                 The results of significant diagnostics from this hospitalization (including imaging, microbiology, ancillary and laboratory) are listed below for reference.    Significant Diagnostic  Studies: ECHOCARDIOGRAM COMPLETE  Result Date: 01/10/2023    ECHOCARDIOGRAM REPORT   Patient Name:   Katie Rasmussen Date of Exam: 01/10/2023 Medical Rec #:  [id number]                 Height:       60.0 in Accession #:    [id number]                Weight:       278.8 lb Date of Birth:  1970/10/18                 BSA:          2.150 m Patient Age:    52 years                  BP:           149/67 mmHg Patient Gender: F                         HR:           82 bpm. Exam Location:  Inpatient Procedure: 2D Echo, Color Doppler and Cardiac Doppler Indications:    atrial fibrillation  History:        Patient has prior history of Echocardiogram examinations, most                 recent 04/05/2022. CHF and Cardiomyopathy, CAD, COPD and chronic                 kidney disease; Risk Factors:Hypertension and Dyslipidemia.  Sonographer:    Delcie Roch RDCS Referring Phys: [id number] [facility] GOEL  Sonographer Comments: Patient is obese. Image acquisition challenging due to patient body habitus. IMPRESSIONS  1. Left ventricular ejection fraction, by estimation, is 60 to 65%. The left ventricle has normal function. The left ventricle has no regional wall motion abnormalities. Left ventricular diastolic parameters are indeterminate.  2. Right ventricular systolic function is normal. The right ventricular size is normal. Tricuspid regurgitation signal is inadequate for assessing PA pressure.  3. The mitral valve is normal in structure. No evidence of mitral valve regurgitation.  4. The aortic valve was not well visualized. Aortic valve regurgitation is not visualized. Mild aortic valve stenosis. FINDINGS  Left Ventricle: Left ventricular ejection fraction, by estimation, is  60 to 65%. The left ventricle has normal function. The left ventricle has no regional wall motion abnormalities. The left ventricular internal cavity size was normal in size. There is  no left ventricular hypertrophy. Left ventricular diastolic parameters  are indeterminate. Right Ventricle: The right ventricular size is normal. No increase in right ventricular wall thickness. Right ventricular systolic function is normal. Tricuspid regurgitation signal is inadequate for assessing PA pressure. Left Atrium: Left atrial size was normal in size. Right Atrium: Right atrial size was normal in size. Pericardium: Trivial pericardial effusion is present. Presence of epicardial fat layer. Mitral Valve: The mitral valve is normal in structure. No evidence of mitral valve regurgitation. Tricuspid Valve: The tricuspid valve is normal in structure. Tricuspid valve regurgitation is trivial. Aortic Valve: The aortic valve was not well visualized. Aortic valve regurgitation is not visualized. Mild aortic stenosis is present. Aortic valve mean gradient measures 9.0 mmHg. Aortic valve peak gradient measures 16.0 mmHg. Aortic valve area, by VTI measures 1.33 cm. Pulmonic Valve: The pulmonic valve was not well visualized. Pulmonic valve regurgitation is not visualized. Aorta: The aortic root and ascending aorta are structurally normal, with no evidence of dilitation. IAS/Shunts: The interatrial septum was not well visualized.  LEFT VENTRICLE PLAX 2D LVIDd:         5.10 cm   Diastology LVIDs:         3.40 cm   LV e' medial:    7.72 cm/s LV PW:         1.00 cm   LV E/e' medial:  14.5 LV IVS:        1.00 cm   LV e' lateral:   7.94 cm/s LVOT diam:     1.70 cm   LV E/e' lateral: 14.1 LV SV:         53 LV SV Index:   25 LVOT Area:     2.27 cm  RIGHT VENTRICLE RV Basal diam:  2.40 cm RV S prime:     8.59 cm/s TAPSE (M-mode): 2.0 cm LEFT ATRIUM             Index        RIGHT ATRIUM           Index LA diam:        4.20 cm 1.95 cm/m   RA Area:     14.00 cm LA Vol (A2C):   36.3 ml 16.89 ml/m  RA Volume:   32.10 ml  14.93 ml/m LA Vol (A4C):   65.6 ml 30.52 ml/m LA Biplane Vol: 49.9 ml 23.21 ml/m  AORTIC VALVE AV Area (Vmax):    1.31 cm AV Area (Vmean):   1.30 cm AV Area (VTI):     1.33 cm  AV Vmax:           200.00 cm/s AV Vmean:          137.500 cm/s AV VTI:            0.398 m AV Peak Grad:      16.0 mmHg AV Mean Grad:      9.0 mmHg LVOT Vmax:         115.00 cm/s LVOT Vmean:        78.650 cm/s LVOT VTI:          0.233 m LVOT/AV VTI ratio: 0.59  AORTA Ao Root diam: 2.60 cm Ao Asc diam:  2.70 cm MITRAL VALVE MV Area (PHT): 4.39 cm     SHUNTS MV Decel  Time: 173 msec     Systemic VTI:  0.23 m MV E velocity: 112.00 cm/s  Systemic Diam: 1.70 cm MV A velocity: 73.10 cm/s MV E/A ratio:  1.53 Epifanio Lesches MD Electronically signed by Epifanio Lesches MD Signature Date/Time: 01/10/2023/5:32:51 PM    Final    DG Chest Portable 1 View  Result Date: 01/09/2023 CLINICAL DATA:  Shortness of EXAM: PORTABLE CHEST 1 VIEW COMPARISON:  01/02/2023, 10/06/2022, CT 08/31/2022 FINDINGS: Prominent epicardial pericardial fat. Minimal atelectasis right base. Stable cardiomediastinal silhouette. No pneumothorax IMPRESSION: Minimal right base atelectasis. Electronically Signed   By: Jasmine Pang M.D.   On: 01/09/2023 18:42    Microbiology: Recent Results (from the past 240 hour(s))  Resp panel by RT-PCR (RSV, Flu A&B, Covid) Anterior Nasal Swab     Status: None   Collection Time: 01/09/23  6:16 PM   Specimen: Anterior Nasal Swab  Result Value Ref Range Status   SARS Coronavirus 2 by RT PCR NEGATIVE NEGATIVE Final   Influenza A by PCR NEGATIVE NEGATIVE Final   Influenza B by PCR NEGATIVE NEGATIVE Final    Comment: (NOTE) The Xpert Xpress SARS-CoV-2/FLU/RSV plus assay is intended as an aid in the diagnosis of influenza from Nasopharyngeal swab specimens and should not be used as a sole basis for treatment. Nasal washings and aspirates are unacceptable for Xpert Xpress SARS-CoV-2/FLU/RSV testing.  Fact Sheet for Patients: BloggerCourse.com  Fact Sheet for Healthcare Providers: SeriousBroker.it  This test is not yet approved or cleared by the  [place] FDA and has been authorized for detection and/or diagnosis of SARS-CoV-2 by FDA under an Emergency Use Authorization (EUA). This EUA will remain in effect (meaning this test can be used) for the duration of the COVID-19 declaration under Section 564(b)(1) of the Act, 21 U.S.C. section 360bbb-3(b)(1), unless the authorization is terminated or revoked.     Resp Syncytial Virus by PCR NEGATIVE NEGATIVE Final    Comment: (NOTE) Fact Sheet for Patients: BloggerCourse.com  Fact Sheet for Healthcare Providers: SeriousBroker.it  This test is not yet approved or cleared by the [place] FDA and has been authorized for detection and/or diagnosis of SARS-CoV-2 by FDA under an Emergency Use Authorization (EUA). This EUA will remain in effect (meaning this test can be used) for the duration of the COVID-19 declaration under Section 564(b)(1) of the Act, 21 U.S.C. section 360bbb-3(b)(1), unless the authorization is terminated or revoked.  Performed at [facility], [address], [place], [place] [address]   Culture, blood (Routine X 2) w Reflex to ID Panel     Status: None (Preliminary result)   Collection Time: 01/09/23 11:10 PM   Specimen: BLOOD  Result Value Ref Range Status   Specimen Description BLOOD BLOOD RIGHT HAND  Final   Special Requests   Final    BOTTLES DRAWN AEROBIC ONLY Blood Culture adequate volume   Culture   Final    NO GROWTH 4 DAYS Performed at [facility], [address] [place], [place] [address]    Report Status PENDING  Incomplete  Culture, blood (Routine X 2) w Reflex to ID Panel     Status: None (Preliminary result)   Collection Time: 01/10/23  2:49 AM   Specimen: BLOOD RIGHT HAND  Result Value Ref Range Status   Specimen Description BLOOD RIGHT HAND  Final   Special Requests   Final    BOTTLES DRAWN AEROBIC AND ANAEROBIC Blood Culture adequate volume   Culture   Final  NO  GROWTH 3 DAYS Performed at [facility], [address], [place], [place] [address]    Report Status PENDING  Incomplete     Labs: Basic Metabolic Panel: Recent Labs  Lab 01/09/23 1755 01/10/23 0249 01/10/23 0252 01/11/23 0017 01/12/23 0038 01/13/23 0034  NA 135 136  --  135 136 135  K 3.0* 3.1*  --  3.6 3.5 4.0  CL 94* 95*  --  98 97* 99  CO2 25 24  --  25 21* 23  GLUCOSE 161* 200*  --  201* 163* 227*  BUN 11 13  --  16 12 14   CREATININE 1.49* 1.65*  --  1.51* 1.44* 1.75*  CALCIUM 9.1 9.0  --  9.0 9.2 9.0  MG  --   --  1.8  --   --   --    Liver Function Tests: Recent Labs  Lab 01/09/23 1755  AST 28  ALT 25  ALKPHOS 102  BILITOT 0.6  PROT 6.5  ALBUMIN 3.3*   No results for input(s): "LIPASE", "AMYLASE" in the last 168 hours. No results for input(s): "AMMONIA" in the last 168 hours. CBC: Recent Labs  Lab 01/09/23 1755 01/10/23 0249 01/11/23 0017 01/12/23 0038 01/13/23 0034  WBC 16.8* 12.8* 12.8* 9.4 11.7*  NEUTROABS 13.0*  --   --   --   --   HGB 13.4 11.6* 11.4* 11.2* 11.0*  HCT 41.1 34.7* 34.9* 33.9* 33.7*  MCV 91.5 93.0 94.1 93.9 91.8  PLT 352 293 255 254 258   Cardiac Enzymes: No results for input(s): "CKTOTAL", "CKMB", "CKMBINDEX", "TROPONINI" in the last 168 hours. BNP: BNP (last 3 results) Recent Labs    01/09/23 1816  BNP 86.3    ProBNP (last 3 results) Recent Labs    08/23/22 1410 10/11/22 1513 10/24/22 1559  PROBNP 404* 764* 210    CBG: Recent Labs  Lab 01/10/23 1835  GLUCAP 220*       Signed:  Zannie Cove MD.  [facility] 01/13/2023, 2:06 PM

## 2023-01-13 NOTE — Progress Notes (Addendum)
Received referral for [facility] PT/OT/aide and DME (RW, bariatric BSC). Per previous CM note, [facility] and [facility] declined the [facility] referral. Contacted Corey with [facility] and he accepted the referral. Met with pt and husband and they agreed with [facility]. They requested for the DME to be delivered home. They don't have a preference for a DME agency. Contacted Jasmine with [facility] and she accepted the referral.

## 2023-01-13 NOTE — Progress Notes (Signed)
Nsg Discharge Note  Admit Date:  01/09/2023 Discharge date: 01/13/2023   [facility] [place] to be D/C'd Home per MD order.  AVS completed.  Patient/caregiver able to verbalize understanding.  Discharge Medication: Allergies as of 01/13/2023       Reactions   Enoxaparin Sodium Other (See Comments)   Patient stated it was added to her chart in 2019 after her MI. She stated she was not alert enough to remember why it was added   Iodine Hives, Itching   Sulfa Antibiotics Other (See Comments)   Shellfish Allergy Hives, Itching        Medication List     STOP taking these medications    methylPREDNISolone 4 MG Tbpk tablet Commonly known as: MEDROL DOSEPAK   metoprolol succinate 25 MG 24 hr tablet Commonly known as: TOPROL-XL   metoprolol tartrate 25 MG tablet Commonly known as: [employer]       TAKE these medications    acetaminophen 500 MG tablet Commonly known as: TYLENOL Take 1,000 mg by mouth every 6 (six) hours as needed for mild pain or moderate pain.   albuterol (2.5 MG/3ML) 0.083% nebulizer solution Commonly known as: PROVENTIL Take 3 mLs (2.5 mg total) by nebulization every 6 (six) hours as needed for wheezing or shortness of breath. What changed: Another medication with the same name was changed. Make sure you understand how and when to take each.   Ventolin HFA 108 (90 Base) MCG/ACT inhaler Generic drug: albuterol INHALE 2 PUFFS BY MOUTH EVERY 4 HOURS ASNEEDED FOR SHORTNESS OF BREATH. What changed:  how much to take how to take this when to take this reasons to take this   allopurinol 100 MG tablet Commonly known as: ZYLOPRIM Take 1 tablet (100 mg total) by mouth daily.   ALPRAZolam 0.25 MG tablet Commonly known as: XANAX Take 1 tablet (0.25 mg total) by mouth at bedtime as needed for anxiety. What changed: when to take this   aspirin EC 81 MG tablet Take 1 tablet (81 mg total) by mouth daily.   atorvastatin 40 MG tablet Commonly known as:  LIPITOR Take 1 tablet (40 mg total) by mouth daily.   Breo Ellipta 200-25 MCG/ACT Aepb Generic drug: fluticasone furoate-vilanterol Inhale 1 puff into the lungs daily.   cephALEXin 250 MG capsule Commonly known as: KEFLEX Take 1 capsule (250 mg total) by mouth 3 (three) times daily for 2 days.   clopidogrel 75 MG tablet Commonly known as: PLAVIX TAKE 1 TABLET BY MOUTH ONCE DAILY WITH BREAKFAST. What changed: when to take this   colchicine 0.6 MG tablet Take 1 tablet (0.6 mg total) by mouth at bedtime. What changed:  when to take this Another medication with the same name was removed. Continue taking this medication, and follow the directions you see here.   diltiazem 240 MG 24 hr capsule Commonly known as: CARDIZEM CD Take 1 capsule (240 mg total) by mouth daily.   diltiazem 30 MG tablet Commonly known as: CARDIZEM Take 1 tablet (30 mg total) by mouth every 6 (six) hours as needed FOR PALPITATIONS What changed: See the new instructions.   levothyroxine 200 MCG tablet Commonly known as: SYNTHROID Take 1 tablet (200 mcg total) by mouth every morning. Start taking on: January 14, 2023 What changed:  medication strength how much to take additional instructions   metolazone 2.5 MG tablet Commonly known as: ZAROXOLYN Take 1 tablet (2.5 mg total) by mouth once a week. What changed: when to take this  potassium chloride 10 MEQ tablet Commonly known as: KLOR-CON Take 1 tablet (10 mEq total) by mouth daily.   predniSONE 20 MG tablet Commonly known as: DELTASONE Take 1 tablet (20 mg total) by mouth daily with breakfast for 3 days. Start taking on: January 14, 2023 What changed:  medication strength how much to take   Spiriva Respimat 1.25 MCG/ACT Aers Generic drug: Tiotropium Bromide Monohydrate Inhale 1 each into the lungs daily.   torsemide 20 MG tablet Commonly known as: DEMADEX Take 1 tablet (20 mg total) by mouth 2 (two) times daily.   Vitamin D (Ergocalciferol)  1.25 MG (50000 UNIT) Caps capsule Commonly known as: DRISDOL Take 50,000 Units by mouth once a week.        Discharge Assessment: Vitals:   01/13/23 0810 01/13/23 0856  BP: 127/62   Pulse: 86   Resp: 20   Temp:    SpO2: 100% 98%   Skin clean, dry and intact without evidence of skin break down, no evidence of skin tears noted. IV catheter discontinued intact. Site without signs and symptoms of complications - no redness or edema noted at insertion site, patient denies c/o pain - only slight tenderness at site.  Dressing with slight pressure applied.  D/c Instructions-Education: Discharge instructions given to patient/family with verbalized understanding. D/c education completed with patient/family including follow up instructions, medication list, d/c activities limitations if indicated, with other d/c instructions as indicated by MD - patient able to verbalize understanding, all questions fully answered. Patient instructed to return to ED, call 911, or call MD for any changes in condition.  Patient escorted via WC, and D/C home via private auto.  Kizzie Bane, RN 01/13/2023 12:33 PM

## 2023-01-13 NOTE — Progress Notes (Signed)
Pt discharged to home. AVS discharge instructed provided to pt by SWOT RN. CM met with pt and family at bedside--per CM, DME to be delivered to pt's home. AVS discharged instructions reprinted by this RN to reflect [facility]. Pt and family verbalized understanding. All PIVs removed by SWOT RN. Telemetry removed and notified.

## 2023-01-13 NOTE — Progress Notes (Signed)
    Durable Medical Equipment  (From admission, onward)           Start     Ordered   01/13/23 1432  For home use only DME Bedside commode  Once       Question:  Patient needs a bedside commode to treat with the following condition  Answer:  Weakness   01/13/23 1432

## 2023-01-13 NOTE — Evaluation (Signed)
Physical Therapy Evaluation Patient Details Name: Katie Rasmussen MRN: [id number] DOB: 1971-04-29 Today's Date: 01/13/2023  History of Present Illness  36 F admitted 7/2 presented to [facility], ED, labs notable for hypokalemia  and  chest x-ray with basilar atelectasis.  Gout flare up as well.  PMH: pulmonary fibrosis, CKD, ischemic cardiomyopathy with recovered EF, CAD, hypothyroidism  Clinical Impression  Pt admitted with above diagnosis. Pt having difficulty with ambulation with RW more than a few steps however pt doesn't walk much at home and was using wheelchair PTA. Pt reports she struggles at times and would benefit from getting a bariatric 3N1 to use at home as well as [facility] services to progress her mobility.  Recommendations updated in flowsheet. Will follow acutely.  Pt currently with functional limitations due to the deficits listed below (see PT Problem List). Pt will benefit from acute skilled PT to increase their independence and safety with mobility to allow discharge.           Assistance Recommended at Discharge Set up Supervision/Assistance  If plan is discharge home, recommend the following:  Can travel by private vehicle           Equipment Recommendations BSC/3in1 (bariatric 3N1)  Recommendations for Other Services       Functional Status Assessment       Precautions / Restrictions Precautions Precautions: Fall Restrictions Weight Bearing Restrictions: No      Mobility  Bed Mobility Overal bed mobility: Independent                  Transfers Overall transfer level: Independent                      Ambulation/Gait Ambulation/Gait assistance: Supervision Gait Distance (Feet): 3 Feet Assistive device: Rolling walker (2 wheels) Gait Pattern/deviations: Decreased stride length, Decreased step length - right, Decreased step length - left, Shuffle   Gait velocity interpretation: <1.31 ft/sec, indicative of household ambulator    General Gait Details: Pt took a few steps and was shaky and then could not step more per pt due to pain and numbness per pt. She was able to take a few steps back to bed and sit down. Overall safe with RW.  Stairs            Wheelchair Mobility     Tilt Bed    Modified Rankin (Stroke Patients Only)       Balance Overall balance assessment: Needs assistance Sitting-balance support: No upper extremity supported, Feet supported Sitting balance-Leahy Scale: Fair     Standing balance support: Bilateral upper extremity supported, During functional activity Standing balance-Leahy Scale: Poor Standing balance comment: relies on UE support for balance and stabitliy                             Pertinent Vitals/Pain Pain Assessment Pain Assessment: No/denies pain    Home Living Family/patient expects to be discharged to:: Private residence Living Arrangements: Spouse/significant other Available Help at Discharge: Family;Available 24 hours/day (husband can assist pt and their handicapped son) Type of Home: House Home Access: Ramped entrance       Home Layout: One level Home Equipment: Shower seat - built in;Grab bars - tub/shower;Hand held shower head;Grab bars - toilet;Rolling Walker (2 wheels);Wheelchair - manual      Prior Function               Mobility Comments: Walks 10  steps only at home and sometimes just uses wheelchair, uses wheelchair alot around house ADLs Comments: B/D husband assists pt with lower body dressing. Pt has assist with all ADls     Hand Dominance   Dominant Hand: Right    Extremity/Trunk Assessment   Upper Extremity Assessment Upper Extremity Assessment: Defer to OT evaluation    Lower Extremity Assessment Lower Extremity Assessment: Generalized weakness    Cervical / Trunk Assessment Cervical / Trunk Assessment: Normal  Communication   Communication: No difficulties  Cognition Arousal/Alertness:  Awake/alert Behavior During Therapy: WFL for tasks assessed/performed Overall Cognitive Status: Within Functional Limits for tasks assessed                                          General Comments General comments (skin integrity, edema, etc.): 88-109 bpm    Exercises     Assessment/Plan    PT Assessment Patient needs continued PT services  PT Problem List Decreased balance;Decreased mobility;Decreased knowledge of use of DME;Decreased safety awareness;Decreased activity tolerance;Decreased knowledge of precautions;Cardiopulmonary status limiting activity;Obesity;Pain       PT Treatment Interventions DME instruction;Gait training;Functional mobility training;Therapeutic activities;Therapeutic exercise;Balance training;Patient/family education    PT Goals (Current goals can be found in the Care Plan section)  Acute Rehab PT Goals Patient Stated Goal: [place] home PT Goal Formulation: With patient Time For Goal Achievement: 01/27/23 Potential to Achieve Goals: Good    Frequency Min 1X/week     Co-evaluation               AM-PAC PT "6 Clicks" Mobility  Outcome Measure Help needed turning from your back to your side while in a flat bed without using bedrails?: None Help needed moving from lying on your back to sitting on the side of a flat bed without using bedrails?: None Help needed moving to and from a bed to a chair (including a wheelchair)?: A Little Help needed standing up from a chair using your arms (e.g., wheelchair or bedside chair)?: A Little Help needed to walk in hospital room?: A Little Help needed climbing 3-5 steps with a railing? : A Lot 6 Click Score: 19    End of Session Equipment Utilized During Treatment: Gait belt Activity Tolerance: Patient limited by fatigue Patient left: in bed;with call bell/phone within reach (sitting EOB) Nurse Communication: Mobility status PT Visit Diagnosis: Unsteadiness on feet (R26.81);Muscle weakness  (generalized) (M62.81);Pain Pain - Right/Left:  (bil) Pain - part of body: Knee;Ankle and joints of foot    Time: 1131-1147 PT Time Calculation (min) (ACUTE ONLY): 16 min   Charges:   PT Evaluation $PT Eval Low Complexity: 1 Low   PT General Charges $$ ACUTE PT VISIT: 1 Visit         Dreden Rivere M,PT Acute Rehab Services [phone]   Bevelyn Buckles 01/13/2023, 2:49 PM

## 2023-01-14 LAB — CULTURE, BLOOD (ROUTINE X 2): Special Requests: ADEQUATE

## 2023-01-15 LAB — CULTURE, BLOOD (ROUTINE X 2)

## 2023-01-17 ENCOUNTER — Other Ambulatory Visit: Payer: Self-pay | Admitting: Oncology

## 2023-01-17 DIAGNOSIS — D508 Other iron deficiency anemias: Secondary | ICD-10-CM

## 2023-01-17 NOTE — Progress Notes (Signed)
[facility]  [address] [place],  [place]  [address] [phone]  Clinic Day: 01/18/2023  Referring physician: Tamsen Snider, NP   HISTORY OF PRESENT ILLNESS:  The patient is a 52 y.o. female with a history of anemia secondary to both iron and B12 deficiency.  She has received IV iron in the past; she was also taking monthly B12 injections to ensure adequate fortification of her iron stores.   However, she comes in today due to recent labs showing leukocytosis.  However, the patient brings to my attention that she has been dealing with for numerous months.  In addition to taking colchicine and allopurinol, she has been taking prednisone for multiple months up until a few days ago.  Despite this, she still has swelling with her left foot.  PHYSICAL EXAM:  Blood pressure (!) 189/81, pulse 78, temperature 98.3 F (36.8 C), resp. rate 16, height 5' (1.524 m), weight 276 lb 14.4 oz (125.6 kg), SpO2 97%. Wt Readings from Last 3 Encounters:  01/18/23 276 lb 14.4 oz (125.6 kg)  01/13/23 (!) 302 lb 4 oz (137.1 kg)  01/09/23 276 lb (125.2 kg)   Body mass index is 54.08 kg/m. Performance status (ECOG): 1 - Symptomatic but completely ambulatory Physical Exam Constitutional:      Appearance: Normal appearance. She is not ill-appearing.     Comments: She is in a wheelchair  HENT:     Mouth/Throat:     Mouth: Mucous membranes are moist.     Pharynx: Oropharynx is clear. No oropharyngeal exudate or posterior oropharyngeal erythema.  Cardiovascular:     Rate and Rhythm: Normal rate and regular rhythm.     Heart sounds: No murmur heard.    No friction rub. No gallop.  Pulmonary:     Effort: Pulmonary effort is normal. No respiratory distress.     Breath sounds: Normal breath sounds. No wheezing, rhonchi or rales.  Abdominal:     General: Bowel sounds are normal. There is no distension.     Palpations: Abdomen is soft. There is no mass.      Tenderness: There is no abdominal tenderness.  Musculoskeletal:        General: No swelling.     Right lower leg: No edema.     Left lower leg: No edema.     Left foot: Swelling present.  Feet:     Left foot:     Skin integrity: Erythema present.  Lymphadenopathy:     Cervical: No cervical adenopathy.     Upper Body:     Right upper body: No supraclavicular or axillary adenopathy.     Left upper body: No supraclavicular or axillary adenopathy.     Lower Body: No right inguinal adenopathy. No left inguinal adenopathy.  Skin:    General: Skin is warm.     Coloration: Skin is not jaundiced.     Findings: No lesion or rash.  Neurological:     General: No focal deficit present.     Mental Status: She is alert and oriented to person, place, and time. Mental status is at baseline.  Psychiatric:        Mood and Affect: Mood normal.        Behavior: Behavior normal.        Thought Content: Thought content normal.    LABS:      Latest Ref Rng & Units 01/18/2023   12:00 AM 01/13/2023   12:34 AM 01/12/2023  12:38 AM  CBC  WBC  13.8     11.7  9.4   Hemoglobin 12.0 - 16.0 12.8     11.0  11.2   Hematocrit 36 - 46 38     33.7  33.9   Platelets 150 - 400 K/uL 350     258  254      This result is from an external source.     Latest Reference Range & Units 01/18/23 15:02  Iron 28 - 170 ug/dL 44  UIBC ug/dL 161  TIBC 096 - 045 ug/dL 409  Saturation Ratios 10.4 - 31.8 % 11  Ferritin 11 - 307 ng/mL 30  Folate >5.9 ng/mL 8.1  Vitamin B12 180 - 914 pg/mL 143 (L)  (L): Data is abnormally low  Assessment/Plan:  A 52 y.o. female who I was asked to evaluate for leukocytosis.  I definitely believe this is related to her months of inflammation with gout.  Furthermore, she has been on prednisone for numerous months.  Despite multiple agents being given to control her gout, it does not appear that it is under control in her left foot.  I made sure the patient understood that I am not worried about  any type of hematologic malignancy/disorder being behind her leukocytosis.  As mentioned previously, I did see this patient years ago for anemia.  However her hemoglobin of  12.8 today is normal.  However, her vitamin B12 level remains low.  She will need to get back on a protracted course of B12 injections so her cobalamin stores can be re-fortified.  Otherwise, I will see her back in 6 months for repeat clinical assessment.  The patient understands all the plans discussed today and is in agreement with them.    Mikolaj Woolstenhulme Kirby Funk, MD

## 2023-01-18 ENCOUNTER — Inpatient Hospital Stay (INDEPENDENT_AMBULATORY_CARE_PROVIDER_SITE_OTHER): Payer: BC Managed Care – PPO | Admitting: Oncology

## 2023-01-18 ENCOUNTER — Inpatient Hospital Stay: Payer: BC Managed Care – PPO | Attending: Oncology

## 2023-01-18 VITALS — BP 189/81 | HR 78 | Temp 98.3°F | Resp 16 | Ht 60.0 in | Wt 276.9 lb

## 2023-01-18 DIAGNOSIS — D72823 Leukemoid reaction: Secondary | ICD-10-CM | POA: Diagnosis not present

## 2023-01-18 DIAGNOSIS — M109 Gout, unspecified: Secondary | ICD-10-CM | POA: Insufficient documentation

## 2023-01-18 DIAGNOSIS — E538 Deficiency of other specified B group vitamins: Secondary | ICD-10-CM | POA: Insufficient documentation

## 2023-01-18 DIAGNOSIS — Z7952 Long term (current) use of systemic steroids: Secondary | ICD-10-CM | POA: Insufficient documentation

## 2023-01-18 DIAGNOSIS — D509 Iron deficiency anemia, unspecified: Secondary | ICD-10-CM | POA: Insufficient documentation

## 2023-01-18 DIAGNOSIS — D72829 Elevated white blood cell count, unspecified: Secondary | ICD-10-CM | POA: Insufficient documentation

## 2023-01-18 DIAGNOSIS — D508 Other iron deficiency anemias: Secondary | ICD-10-CM

## 2023-01-18 LAB — FOLATE: Folate: 8.1 ng/mL (ref >5.9)

## 2023-01-18 LAB — CMP (CANCER CENTER ONLY)
ALT: 31 U/L (ref 0–44)
AST: 37 U/L (ref 15–41)
Albumin: 3.9 g/dL (ref 3.5–5.0)
Alkaline Phosphatase: 104 U/L (ref 38–126)
Anion gap: 15 (ref 5–15)
BUN: 22 mg/dL — ABNORMAL HIGH (ref 6–20)
CO2: 22 mmol/L (ref 22–32)
Calcium: 9.3 mg/dL (ref 8.9–10.3)
Chloride: 98 mmol/L (ref 98–111)
Creatinine: 1.41 mg/dL — ABNORMAL HIGH (ref 0.44–1.00)
GFR, Estimated: 45 mL/min — ABNORMAL LOW (ref >60)
Glucose, Bld: 142 mg/dL — ABNORMAL HIGH (ref 70–99)
Potassium: 3.5 mmol/L (ref 3.5–5.1)
Sodium: 135 mmol/L (ref 135–145)
Total Bilirubin: 0.5 mg/dL (ref 0.3–1.2)
Total Protein: 7.6 g/dL (ref 6.5–8.1)

## 2023-01-18 LAB — IRON AND TIBC
Iron: 44 ug/dL (ref 28–170)
Saturation Ratios: 11 % (ref 10.4–31.8)
TIBC: 409 ug/dL (ref 250–450)
UIBC: 365 ug/dL

## 2023-01-18 LAB — VITAMIN B12: Vitamin B-12: 143 pg/mL — ABNORMAL LOW (ref 180–914)

## 2023-01-18 LAB — CBC AND DIFFERENTIAL
HCT: 38 (ref 36–46)
Hemoglobin: 12.8 (ref 12.0–16.0)
Neutrophils Absolute: 10.49
Platelets: 350 10*3/uL (ref 150–400)
WBC: 13.8

## 2023-01-18 LAB — CBC: RBC: 4.23 (ref 3.87–5.11)

## 2023-01-18 LAB — FERRITIN: Ferritin: 30 ng/mL (ref 11–307)

## 2023-01-21 ENCOUNTER — Other Ambulatory Visit: Payer: Self-pay | Admitting: Oncology

## 2023-01-21 DIAGNOSIS — D72823 Leukemoid reaction: Secondary | ICD-10-CM

## 2023-01-23 ENCOUNTER — Other Ambulatory Visit: Payer: Self-pay

## 2023-01-23 ENCOUNTER — Telehealth: Payer: Self-pay | Admitting: Oncology

## 2023-01-23 NOTE — Telephone Encounter (Signed)
Patient has been scheduled. Aware of appt date and time.  Pt was transferred to Triage Line since she has not heard back on her recent lab results.   Scheduling Message Entered by Rennis Harding A on 01/21/2023 at  4:14 PM Priority: Routine <No visit type provided>  Department: [facility]  Provider:  Scheduling Notes:  Labs/appt on 07-24-23

## 2023-01-24 NOTE — Progress Notes (Signed)
Cardiology Office Note:  .   Date:  01/25/2023  ID:  Katie Rasmussen, DOB July 16, 1970, MRN [id number] PCP: [name], FNP  [facility] Providers Cardiologist:  Gypsy Balsam, MD    History of Present Illness: .   Katie Rasmussen is a 52 y.o. female with a past medical history of dilated cardiomyopathy, HFrEF, CAD s/p DES to proximal LAD March 2019 s/p MI, hypertension, SVT, atrial fibrillation, chronic venous insufficiency, COPD, pulmonary fibrosis, hypothyroidism, CKD stage III, gout, dyslipidemia, OSA on CPAP, BMI greater than 50.  01/10/2023 echo EF 60 to 65%, technically difficult study, mild aortic stenosis 04/05/2022 echo EF 60 to 65% 09/27/2017 DES to proximal LAD  Most recently admitted to the hospital on 01/09/2023 to 01/13/2023 with SVT, acute on chronic gout flare, UTI, chronic diastolic heart failure.  It was unclear if she was in SVT versus A-fib with RVR so EP was consulted and it was felt that her EKG had possibly 2 different SVTs, suspect AVNRT and atrial tachycardia.  Her beta-blocker has been stopped secondary to pulmonary disease, cardiology increased her diltiazem to 240 mg daily, no possibility for ablation secondary to BMI.  She presents today accompanied by her son for follow-up after recent hospitalization as outlined above.  She is feeling better, has had 2 instances where her heart was racing however she was able to terminate this with vagal maneuvers.  She is currently wheelchair-bound secondary to ongoing leg pain, this has been frustrating for her as she has been told her pain is due to gout and there has been no definitive answer to her satisfaction. She denies chest pain, dyspnea, pnd, orthopnea, n, v, dizziness, syncope, edema, weight gain, or early satiety.     ROS: Review of Systems  Constitutional:  Positive for malaise/fatigue.  HENT: Negative.    Eyes: Negative.   Respiratory:  Positive for shortness of breath.    Cardiovascular:  Positive for palpitations and leg swelling.  Gastrointestinal: Negative.   Genitourinary: Negative.   Musculoskeletal:  Positive for joint pain.  Skin: Negative.   Neurological: Negative.   Endo/Heme/Allergies: Negative.   Psychiatric/Behavioral: Negative.      Studies Reviewed: .        Cardiac Studies & Procedures   CARDIAC CATHETERIZATION  CARDIAC CATHETERIZATION 09/18/2017  Narrative 1. Severe single vessel CAD involving a 75% eccentric proximal LAD stenosis, treated successfully with a single DES (3.0x18 mm Xience [place]) 2. Patent Left Main, LCx, and RCA with minimal nonobstructive disease 3. Severely elevated LVEDP 4. Moderate LV systolic dysfunction with LVEF estimated at 40-45%  Recommend: DAPT with ASA and clopidogrel x 12 months without interruption, med Rx for CHF, secondary risk reduction  Findings Coronary Findings Diagnostic  Dominance: Right  Left Anterior Descending Prox LAD lesion is 75% stenosed. The lesion is focal and eccentric.  Left Circumflex Vessel is angiographically normal.  Right Coronary Artery Vessel is angiographically normal. Widely patent, dominant RCA  Intervention  Prox LAD lesion Stent Lesion crossed with guidewire using a WIRE COUGAR XT STRL 190CM. Pre-stent angioplasty was not performed. A drug-eluting stent was successfully placed using a STENT SIERRA 3.00 X 18 MM. Post-stent angioplasty was performed using a BALLOON SAPPHIRE [place] 3.25X12. Maximum pressure:  16 atm. Post-Intervention Lesion Assessment The intervention was successful. Pre-interventional TIMI flow is 3. Post-intervention TIMI flow is 3. No complications occurred at this lesion. There is a 0% residual stenosis post intervention.     ECHOCARDIOGRAM  ECHOCARDIOGRAM COMPLETE 01/10/2023  Narrative ECHOCARDIOGRAM  REPORT    Patient Name:   Katie Rasmussen Date of Exam: 01/10/2023 Medical Rec #:  [id number]                 Height:       60.0  in Accession #:    [id number]                Weight:       278.8 lb Date of Birth:  1970-09-10                 BSA:          2.150 m Patient Age:    52 years                  BP:           149/67 mmHg Patient Gender: F                         HR:           82 bpm. Exam Location:  Inpatient  Procedure: 2D Echo, Color Doppler and Cardiac Doppler  Indications:    atrial fibrillation  History:        Patient has prior history of Echocardiogram examinations, most recent 04/05/2022. CHF and Cardiomyopathy, CAD, COPD and chronic kidney disease; Risk Factors:Hypertension and Dyslipidemia.  Sonographer:    Delcie Roch RDCS Referring Phys: [id number] [facility]   Sonographer Comments: Patient is obese. Image acquisition challenging due to patient body habitus. IMPRESSIONS   1. Left ventricular ejection fraction, by estimation, is 60 to 65%. The left ventricle has normal function. The left ventricle has no regional wall motion abnormalities. Left ventricular diastolic parameters are indeterminate. 2. Right ventricular systolic function is normal. The right ventricular size is normal. Tricuspid regurgitation signal is inadequate for assessing PA pressure. 3. The mitral valve is normal in structure. No evidence of mitral valve regurgitation. 4. The aortic valve was not well visualized. Aortic valve regurgitation is not visualized. Mild aortic valve stenosis.  FINDINGS Left Ventricle: Left ventricular ejection fraction, by estimation, is 60 to 65%. The left ventricle has normal function. The left ventricle has no regional wall motion abnormalities. The left ventricular internal cavity size was normal in size. There is no left ventricular hypertrophy. Left ventricular diastolic parameters are indeterminate.  Right Ventricle: The right ventricular size is normal. No increase in right ventricular wall thickness. Right ventricular systolic function is normal. Tricuspid regurgitation signal is  inadequate for assessing PA pressure.  Left Atrium: Left atrial size was normal in size.  Right Atrium: Right atrial size was normal in size.  Pericardium: Trivial pericardial effusion is present. Presence of epicardial fat layer.  Mitral Valve: The mitral valve is normal in structure. No evidence of mitral valve regurgitation.  Tricuspid Valve: The tricuspid valve is normal in structure. Tricuspid valve regurgitation is trivial.  Aortic Valve: The aortic valve was not well visualized. Aortic valve regurgitation is not visualized. Mild aortic stenosis is present. Aortic valve mean gradient measures 9.0 mmHg. Aortic valve peak gradient measures 16.0 mmHg. Aortic valve area, by VTI measures 1.33 cm.  Pulmonic Valve: The pulmonic valve was not well visualized. Pulmonic valve regurgitation is not visualized.  Aorta: The aortic root and ascending aorta are structurally normal, with no evidence of dilitation.  IAS/Shunts: The interatrial septum was not well visualized.   LEFT VENTRICLE PLAX 2D LVIDd:  5.10 cm   Diastology LVIDs:         3.40 cm   LV e' medial:    7.72 cm/s LV PW:         1.00 cm   LV E/e' medial:  14.5 LV IVS:        1.00 cm   LV e' lateral:   7.94 cm/s LVOT diam:     1.70 cm   LV E/e' lateral: 14.1 LV SV:         53 LV SV Index:   25 LVOT Area:     2.27 cm   RIGHT VENTRICLE RV Basal diam:  2.40 cm RV S prime:     8.59 cm/s TAPSE (M-mode): 2.0 cm  LEFT ATRIUM             Index        RIGHT ATRIUM           Index LA diam:        4.20 cm 1.95 cm/m   RA Area:     14.00 cm LA Vol (A2C):   36.3 ml 16.89 ml/m  RA Volume:   32.10 ml  14.93 ml/m LA Vol (A4C):   65.6 ml 30.52 ml/m LA Biplane Vol: 49.9 ml 23.21 ml/m AORTIC VALVE AV Area (Vmax):    1.31 cm AV Area (Vmean):   1.30 cm AV Area (VTI):     1.33 cm AV Vmax:           200.00 cm/s AV Vmean:          137.500 cm/s AV VTI:            0.398 m AV Peak Grad:      16.0 mmHg AV Mean Grad:       9.0 mmHg LVOT Vmax:         115.00 cm/s LVOT Vmean:        78.650 cm/s LVOT VTI:          0.233 m LVOT/AV VTI ratio: 0.59  AORTA Ao Root diam: 2.60 cm Ao Asc diam:  2.70 cm  MITRAL VALVE MV Area (PHT): 4.39 cm     SHUNTS MV Decel Time: 173 msec     Systemic VTI:  0.23 m MV E velocity: 112.00 cm/s  Systemic Diam: 1.70 cm MV A velocity: 73.10 cm/s MV E/A ratio:  1.53  Epifanio Lesches MD Electronically signed by Epifanio Lesches MD Signature Date/Time: 01/10/2023/5:32:51 PM    Final             Risk Assessment/Calculations:             Physical Exam:   VS:  BP 120/78 (BP Location: Right Arm, Patient Position: Sitting, Cuff Size: Normal)   Pulse 95   Ht 5\' 1"  (1.549 m)   Wt 279 lb (126.6 kg)   SpO2 98%   BMI 52.72 kg/m    Wt Readings from Last 3 Encounters:  01/25/23 279 lb (126.6 kg)  01/18/23 276 lb 14.4 oz (125.6 kg)  01/13/23 (!) 302 lb 4 oz (137.1 kg)    GEN: Obese, tearful, no acute distress  NECK: No JVD; No carotid bruits CARDIAC: RRR, no murmurs, rubs, gallops RESPIRATORY:  Clear to auscultation without rales, wheezing or rhonchi  ABDOMEN: Soft, non-tender, non-distended EXTREMITIES:  edematous feet; No deformity   ASSESSMENT AND PLAN: .   CAD -s/p DES to LAD in 2019, Stable with no anginal symptoms. No indication for ischemic evaluation.  Continue Plavix 75 mg daily, continue aspirin 81 mg daily.  HFrEF with normalization of EF-most recent echo 60 to 65%, NYHA class II, euvolemic.  Beta-blocker previously stopped secondary to pulmonary issues.  Not a good candidate for SGLT2 inhibitor secondary to body habitus.  Continue Demadex 20 mg twice daily, continue metolazone 2.5 mg once a week.  SVT/AVNRT -her diltiazem was increased to 240 mg daily during her hospitalization, since her discharge she has had 2 episodes where her heart is racing however she was able to terminate this with vagal maneuvers.  She is not a candidate for an ablation procedure  secondary to obesity.  Obesity-BMI 52, encouraged healthy diet and exercise however she is very limited with her mobility.  Will refer her to healthy weight and wellness.  Hyperlipidemia-most recent LDL on file for review was elevated at 104, triglycerides 335, not entirely clear who manages this.  Will need to repeat at next OV as her LDL is now well-managed.       Dispo: Follow-up with Dr. Bing Matter in 6 weeks.  Signed, Flossie Dibble, NP

## 2023-01-25 ENCOUNTER — Encounter: Payer: Self-pay | Admitting: Cardiology

## 2023-01-25 ENCOUNTER — Ambulatory Visit: Payer: BC Managed Care – PPO | Attending: Cardiology | Admitting: Cardiology

## 2023-01-25 VITALS — BP 120/78 | HR 95 | Ht 61.0 in | Wt 279.0 lb

## 2023-01-25 DIAGNOSIS — E662 Morbid (severe) obesity with alveolar hypoventilation: Secondary | ICD-10-CM | POA: Diagnosis not present

## 2023-01-25 DIAGNOSIS — I2510 Atherosclerotic heart disease of native coronary artery without angina pectoris: Secondary | ICD-10-CM | POA: Diagnosis not present

## 2023-01-25 DIAGNOSIS — I509 Heart failure, unspecified: Secondary | ICD-10-CM

## 2023-01-25 DIAGNOSIS — I5041 Acute combined systolic (congestive) and diastolic (congestive) heart failure: Secondary | ICD-10-CM | POA: Diagnosis not present

## 2023-01-25 DIAGNOSIS — I5020 Unspecified systolic (congestive) heart failure: Secondary | ICD-10-CM

## 2023-01-25 NOTE — Patient Instructions (Signed)
Medication Instructions:  Your physician recommends that you continue on your current medications as directed. Please refer to the Current Medication list given to you today.  *If you need a refill on your cardiac medications before your next appointment, please call your pharmacy*   Lab Work: NONE If you have labs (blood work) drawn today and your tests are completely normal, you will receive your results only by: MyChart Message (if you have MyChart) OR A paper copy in the mail If you have any lab test that is abnormal or we need to change your treatment, we will call you to review the results.   Testing/Procedures: NONE   Follow-Up: At [facility], you and your health needs are our priority.  As part of our continuing mission to provide you with exceptional heart care, we have created designated Provider Care Teams.  These Care Teams include your primary Cardiologist (physician) and Advanced Practice Providers (APPs -  Physician Assistants and Nurse Practitioners) who all work together to provide you with the care you need, when you need it.  We recommend signing up for the patient portal called "MyChart".  Sign up information is provided on this After Visit Summary.  MyChart is used to connect with patients for Virtual Visits (Telemedicine).  Patients are able to view lab/test results, encounter notes, upcoming appointments, etc.  Non-urgent messages can be sent to your provider as well.   To learn more about what you can do with MyChart, go to [email or web address].    Your next appointment:   6 week(s)  Provider:   Gypsy Balsam, MD    Other Instructions

## 2023-02-12 ENCOUNTER — Ambulatory Visit: Payer: BC Managed Care – PPO | Attending: Cardiology | Admitting: Cardiology

## 2023-02-12 ENCOUNTER — Encounter: Payer: Self-pay | Admitting: Cardiology

## 2023-02-12 VITALS — BP 130/84 | HR 107 | Ht 61.0 in | Wt 279.0 lb

## 2023-02-12 DIAGNOSIS — I420 Dilated cardiomyopathy: Secondary | ICD-10-CM | POA: Diagnosis not present

## 2023-02-12 DIAGNOSIS — I471 Supraventricular tachycardia, unspecified: Secondary | ICD-10-CM | POA: Diagnosis not present

## 2023-02-12 MED ORDER — DILTIAZEM HCL ER COATED BEADS 240 MG PO CP24: 240.0000 mg | ORAL_CAPSULE | Freq: Every day | ORAL | 3 refills | Status: DC

## 2023-02-12 NOTE — Progress Notes (Signed)
  Electrophysiology Office Note:   Date:  02/12/2023  ID:  Katie Rasmussen, DOB May 27, 1971, MRN [id number]  Primary Cardiologist: Gypsy Balsam, MD Electrophysiologist: None      History of Present Illness:   Katie Rasmussen is a 52 y.o. female with h/o hypothyroidism, pulmonary fibrosis, CKD stage III, coronary artery disease, chronic systolic heart failure with recovered ejection fraction, SVT seen today for routine electrophysiology followup.  Since last being seen in our clinic the patient reports doing well since she was hospitalized.  Her diltiazem was increased.  She has not had any further episodes of SVT up into the last few hours.  Her heart rate was in the 140s prior to presentation to the clinic.  She does state that she was late taking her medications this morning.  she denies chest pain, palpitations, dyspnea, PND, orthopnea, nausea, vomiting, dizziness, syncope, edema, weight gain, or early satiety.   Review of systems complete and found to be negative unless listed in HPI.   EP Information / Studies Reviewed:    EKG is ordered today. Personal review as below.  EKG Interpretation Date/Time:  Monday February 12 2023 10:51:40 EDT Ventricular Rate:  96 PR Interval:  196 QRS Duration:  62 QT Interval:  356 QTC Calculation: 449 R Axis:   52  Text Interpretation: Normal sinus rhythm Low voltage QRS Cannot rule out Anteroseptal infarct , age undetermined Abnormal ECG When compared with ECG of 09-Jan-2023 17:53, No significant change since last tracing Confirmed by ,  ([id number]) on 02/12/2023 10:55:45 AM     Risk Assessment/Calculations:              Physical Exam:   VS:  BP 130/84   Pulse (!) 107   Ht 5\' 1"  (1.549 m)   Wt 279 lb (126.6 kg)   SpO2 98%   BMI 52.72 kg/m    Wt Readings from Last 3 Encounters:  02/12/23 279 lb (126.6 kg)  01/25/23 279 lb (126.6 kg)  01/18/23 276 lb 14.4 oz (125.6 kg)     GEN: Well nourished, well developed  in no acute distress NECK: No JVD; No carotid bruits CARDIAC: Regular rate and rhythm, no murmurs, rubs, gallops RESPIRATORY:  Clear to auscultation without rales, wheezing or rhonchi  ABDOMEN: Soft, non-tender, non-distended EXTREMITIES:  No edema; No deformity   ASSESSMENT AND PLAN:    1.  SVT: Likely due to AVNRT.  She is continue to have episodes of SVT, though it is improved on her diltiazem.  She  take her as needed diltiazem.  2.  Coronary artery disease: No current chest pain  3.  Chronic systolic heart failure: Ejection fraction is since improved.  Continue plan per primary cardiology.  4.  Obesity: Lifestyle modification encouraged  Follow up with Dr. Elberta Fortis in 6 months  Signed,  Jorja Loa, MD

## 2023-02-12 NOTE — Patient Instructions (Addendum)
Medication Instructions:  °Your physician recommends that you continue on your current medications as directed. Please refer to the Current Medication list given to you today. ° °*If you need a refill on your cardiac medications before your next appointment, please call your pharmacy* ° ° °Lab Work: °None ordered ° ° °Testing/Procedures: °None ordered ° ° °Follow-Up: °At [facility], you and your health needs are our priority.  As part of our continuing mission to provide you with exceptional heart care, we have created designated Provider Care Teams.  These Care Teams include your primary Cardiologist (physician) and Advanced Practice Providers (APPs -  Physician Assistants and Nurse Practitioners) who all work together to provide you with the care you need, when you need it. ° °Your next appointment:   °6 month(s) ° °The format for your next appointment:   °In Person ° °Provider:   °Will Camnitz, MD ° ° ° °Thank you for choosing [facility]!! ° ° ° , RN °[phone] °  °

## 2023-02-22 ENCOUNTER — Ambulatory Visit: Payer: BC Managed Care – PPO

## 2023-03-07 ENCOUNTER — Ambulatory Visit: Payer: BC Managed Care – PPO | Attending: Cardiology

## 2023-03-07 NOTE — Progress Notes (Deleted)
Office Visit    Patient Name: Katie Rasmussen Date of Encounter: 03/07/2023  Primary Care Provider:  [name], FNP Primary Cardiologist:  Gypsy Balsam, MD  Chief Complaint    Heart Failure Medication Titration - EF ***% (by echo ***)  Significant Past Medical History                    Allergies  Allergen Reactions   Enoxaparin Sodium Other (See Comments)    Patient stated it was added to her chart in 2019 after her MI. She stated she was not alert enough to remember why it was added   Iodine Hives and Itching   Sulfa Antibiotics Other (See Comments)   Shellfish Allergy Hives and Itching    History of Present Illness    Katie Rasmussen is a 52 y.o. female patient of Dr ***,  Blood Pressure Goal:  130/80  GDMT: ACEI/ARB/ARNI [] Yes [] No   Beta blocker [] Yes [] No   MRA [] Yes [] No   SGLT2 inhibitor [] Yes [] No     Previously tried:    Family Hx:     Social Hx:      Tobacco:  Alcohol:  Caffeine: Diet:      Exercise:   Home BP readings:      Adherence Assessment  Do you ever forget to take your medication? [] Yes [] No  Do you ever skip doses due to side effects? [] Yes [] No  Do you have trouble affording your medicines? [] Yes [] No  Are you ever unable to pick up your medication due to transportation difficulties? [] Yes [] No  Do you ever stop taking your medications because you don't believe they are helping? [] Yes [] No  Do you check your weight daily? [] Yes [] No   Adherence strategy: ***  Barriers to obtaining medications: ***  Accessory Clinical Findings    Lab Results  Component Value Date   CREATININE 1.41 (H) 01/18/2023   BUN 22 (H) 01/18/2023   NA 135 01/18/2023   K 3.5 01/18/2023   CL 98 01/18/2023   CO2 22 01/18/2023   Lab Results  Component Value Date   ALT 31 01/18/2023   AST 37 01/18/2023   ALKPHOS 104 01/18/2023   BILITOT 0.5 01/18/2023   Lab Results  Component Value Date    HGBA1C 5.8 (H) 10/10/2019    Home Medications/Allergies    Current Outpatient Medications  Medication Sig Dispense Refill   acetaminophen (TYLENOL) 500 MG tablet Take 1,000 mg by mouth every 6 (six) hours as needed for mild pain or moderate pain.     albuterol (PROVENTIL) (2.5 MG/3ML) 0.083% nebulizer solution Take 3 mLs (2.5 mg total) by nebulization every 6 (six) hours as needed for wheezing or shortness of breath. 75 mL 12   allopurinol (ZYLOPRIM) 100 MG tablet Take 1 tablet (100 mg total) by mouth daily. 30 tablet 6   ALPRAZolam (XANAX) 0.25 MG tablet Take 1 tablet (0.25 mg total) by mouth at bedtime as needed for anxiety. 10 tablet 0   aspirin EC 81 MG EC tablet Take 1 tablet (81 mg total) by mouth daily.     atorvastatin (LIPITOR) 40 MG tablet Take 1 tablet (40 mg total) by mouth daily. 90 tablet 3   clopidogrel (PLAVIX) 75 MG tablet TAKE 1 TABLET BY MOUTH ONCE DAILY WITH BREAKFAST. 90 tablet 3   colchicine 0.6 MG tablet Take 1 tablet (0.6 mg total) by mouth at bedtime.     Cyanocobalamin (VITAMIN B-12) 1000 MCG SUBL  Inject 1,000 mcg into the skin. ONE INJECTION DAILY X 7 DAYS, THEN ONE INJECTION WEEKLY X FOUR WEEKS, THEN ONE INJECTION MONTHLY INDEFINITELY     diltiazem (CARDIZEM) 30 MG tablet Take 1 tablet (30 mg total) by mouth every 6 (six) hours as needed FOR PALPITATIONS 90 tablet 3   fluticasone furoate-vilanterol (BREO ELLIPTA) 200-25 MCG/INH AEPB Inhale 1 puff into the lungs daily. 1 each 11   levothyroxine (SYNTHROID) 200 MCG tablet Take 1 tablet (200 mcg total) by mouth every morning. 30 tablet 0   metolazone (ZAROXOLYN) 2.5 MG tablet Take 1 tablet (2.5 mg total) by mouth once a week.     potassium chloride (KLOR-CON) 10 MEQ tablet Take 1 tablet (10 mEq total) by mouth daily. 30 tablet 2   SPIRIVA RESPIMAT 1.25 MCG/ACT AERS Inhale 1 each into the lungs daily.     TIADYLT ER 240 MG 24 hr capsule Take 240 mg by mouth daily.     torsemide (DEMADEX) 20 MG tablet Take 1 tablet (20  mg total) by mouth 2 (two) times daily. 180 tablet 2   VENTOLIN HFA 108 (90 Base) MCG/ACT inhaler INHALE 2 PUFFS BY MOUTH EVERY 4 HOURS ASNEEDED FOR SHORTNESS OF BREATH. 18 g 0   Vitamin D, Ergocalciferol, (DRISDOL) 1.25 MG (50000 UNIT) CAPS capsule Take 50,000 Units by mouth once a week.     Current Facility-Administered Medications  Medication Dose Route Frequency Provider Last Rate Last Admin   triamcinolone acetonide (KENALOG) 10 MG/ML injection 10 mg  10 mg Other Once Asencion Islam, DPM         Allergies  Allergen Reactions   Enoxaparin Sodium Other (See Comments)    Patient stated it was added to her chart in 2019 after her MI. She stated she was not alert enough to remember why it was added   Iodine Hives and Itching   Sulfa Antibiotics Other (See Comments)   Shellfish Allergy Hives and Itching       Assessment & Plan   No BP recorded.  {Refresh Note OR Click here to enter BP  :1}***  No problem-specific Assessment & Plan notes found for this encounter.   Phillips Hay PharmD CPP [facility]  [address] [place], [place] [address] [phone]

## 2023-03-08 ENCOUNTER — Encounter: Payer: Self-pay | Admitting: Cardiology

## 2023-03-09 ENCOUNTER — Encounter: Payer: Self-pay | Admitting: Cardiology

## 2023-03-09 ENCOUNTER — Ambulatory Visit: Payer: BC Managed Care – PPO | Attending: Cardiology | Admitting: Cardiology

## 2023-03-09 VITALS — BP 142/90 | HR 85 | Ht 61.0 in | Wt 275.0 lb

## 2023-03-09 DIAGNOSIS — I2510 Atherosclerotic heart disease of native coronary artery without angina pectoris: Secondary | ICD-10-CM

## 2023-03-09 DIAGNOSIS — R600 Localized edema: Secondary | ICD-10-CM

## 2023-03-09 DIAGNOSIS — I420 Dilated cardiomyopathy: Secondary | ICD-10-CM

## 2023-03-09 DIAGNOSIS — M2550 Pain in unspecified joint: Secondary | ICD-10-CM

## 2023-03-09 NOTE — Addendum Note (Signed)
Addended by: Baldo Ash D on: 03/09/2023 02:09 PM   Modules accepted: Orders

## 2023-03-09 NOTE — Progress Notes (Signed)
Cardiology Office Note:    Date:  03/09/2023   ID:  Yerlin Govin, DOB Nov 17, 1970, MRN [id number]  PCP:  [name], FNP  Cardiologist:  Gypsy Balsam, MD    Referring MD: [name], *   Chief Complaint  Patient presents with   Shortness of Breath   Fatigue    History of Present Illness:    Katie Rasmussen is a 52 y.o. female with past medical history that includes coronary artery disease she did have drug-eluting stent to proximal LAD in March 2019 in face of acute myocardial infarction, history of cardiomyopathy ejection fraction 30 to 35% however with appropriate guideline medical therapy normalized.  She also got history of supraventricular tachycardia, morbid obesity, essential hypertension, asthma, obstructive sleep apnea.  Comes today to months for follow-up overall cardiac wise seems to be doing well rare episode of palpitation that she is able to interrupt with some Valsalva maneuver.  She is very happy with new regiment of medications.  She complains obviously of having pain in multiple joints including including metatarsal joints.  Suspicion for rheumatoid arthritis she apparently had 2 sister who got rheumatoid arthritis already.  Past Medical History:  Diagnosis Date   Acute combined systolic and diastolic CHF, NYHA class 3 (HCC)    Acute on chronic respiratory failure with hypoxia and hypercapnia (HCC) 09/14/2017   Ankle sprain 02/18/2020   Asthma exacerbation 02/18/2020   Bronchitis due to tobacco use 08/2017   Chest pain 02/18/2020   Chronic respiratory failure (HCC)    CKD (chronic kidney disease), stage III (HCC)    COPD (chronic obstructive pulmonary disease) (HCC)    Coronary artery disease    DCM (dilated cardiomyopathy) (HCC)    Diastolic CHF (HCC)    Dyslipidemia 10/08/2017   Dyspnea on exertion 05/07/2018   Elevated troponin    Gout    Hypertension 02/18/2020   Hypothyroidism    h/o Grave's, s/p thyroidectomy &  RAI in 1990s   Ischemic cardiomyopathy initial ejection fraction 35 to 40% in March 2019, normalization of ejection fraction based on echocardiogram from 2020 10/08/2017   Ejection fraction 35-40% in March 2019   Leukocytosis 02/18/2020   Obesity hypoventilation syndrome (HCC)    Orthostatic hypotension 02/18/2020   OSA (obstructive sleep apnea) 02/18/2020   Peripheral edema 02/18/2020   Pneumonia 08/2017   Postablative hypothyroidism 04/03/2019   Respiratory acidosis 02/18/2020   Sepsis (HCC) 02/18/2020   Supraventricular tachycardia 07/08/2020   Systolic CHF with reduced left ventricular function, NYHA class 3 (HCC) 09/13/2017   Upper respiratory infection 02/18/2020   Weakness 02/18/2020    Past Surgical History:  Procedure Laterality Date   CESAREAN SECTION     CORONARY STENT INTERVENTION N/A 09/18/2017   Procedure: CORONARY STENT INTERVENTION;  Surgeon: Tonny Bollman, MD;  Location: [facility] INVASIVE CV LAB;  Service: Cardiovascular;  Laterality: N/A;  prox LAD   LEFT HEART CATH AND CORONARY ANGIOGRAPHY N/A 09/18/2017   Procedure: LEFT HEART CATH AND CORONARY ANGIOGRAPHY;  Surgeon: Tonny Bollman, MD;  Location: [facility] INVASIVE CV LAB;  Service: Cardiovascular;  Laterality: N/A;   THYROID SURGERY     s/p RAI for Grave's    Current Medications: Current Meds  Medication Sig   acetaminophen (TYLENOL) 500 MG tablet Take 1,000 mg by mouth every 6 (six) hours as needed for mild pain or moderate pain.   albuterol (PROVENTIL) (2.5 MG/3ML) 0.083% nebulizer solution Take 3 mLs (2.5 mg total) by nebulization every 6 (six) hours as  needed for wheezing or shortness of breath.   allopurinol (ZYLOPRIM) 100 MG tablet Take 1 tablet (100 mg total) by mouth daily.   ALPRAZolam (XANAX) 0.25 MG tablet Take 1 tablet (0.25 mg total) by mouth at bedtime as needed for anxiety.   aspirin EC 81 MG EC tablet Take 1 tablet (81 mg total) by mouth daily.   atorvastatin (LIPITOR) 40 MG tablet Take 1 tablet (40 mg total) by  mouth daily.   clopidogrel (PLAVIX) 75 MG tablet TAKE 1 TABLET BY MOUTH ONCE DAILY WITH BREAKFAST. (Patient taking differently: Take 75 mg by mouth daily.)   colchicine 0.6 MG tablet Take 1 tablet (0.6 mg total) by mouth at bedtime.   Cyanocobalamin (VITAMIN B-12) 1000 MCG SUBL Inject 1,000 mcg into the skin. ONE INJECTION DAILY X 7 DAYS, THEN ONE INJECTION WEEKLY X FOUR WEEKS, THEN ONE INJECTION MONTHLY INDEFINITELY   diltiazem (CARDIZEM) 30 MG tablet Take 1 tablet (30 mg total) by mouth every 6 (six) hours as needed FOR PALPITATIONS (Patient taking differently: Take 30 mg by mouth every 6 (six) hours as needed (Palpitations).)   fluticasone furoate-vilanterol (BREO ELLIPTA) 200-25 MCG/INH AEPB Inhale 1 puff into the lungs daily.   levothyroxine (SYNTHROID) 200 MCG tablet Take 1 tablet (200 mcg total) by mouth every morning.   metolazone (ZAROXOLYN) 2.5 MG tablet Take 1 tablet (2.5 mg total) by mouth once a week.   potassium chloride (KLOR-CON) 10 MEQ tablet Take 1 tablet (10 mEq total) by mouth daily.   SPIRIVA RESPIMAT 1.25 MCG/ACT AERS Inhale 1 each into the lungs daily.   TIADYLT ER 240 MG 24 hr capsule Take 240 mg by mouth daily.   torsemide (DEMADEX) 20 MG tablet Take 1 tablet (20 mg total) by mouth 2 (two) times daily.   VENTOLIN HFA 108 (90 Base) MCG/ACT inhaler INHALE 2 PUFFS BY MOUTH EVERY 4 HOURS ASNEEDED FOR SHORTNESS OF BREATH. (Patient taking differently: Inhale 2 puffs into the lungs every 4 (four) hours as needed for shortness of breath. INHALE 2 PUFFS BY MOUTH EVERY 4 HOURS ASNEEDED FOR SHORTNESS OF BREATH.)   Vitamin D, Ergocalciferol, (DRISDOL) 1.25 MG (50000 UNIT) CAPS capsule Take 50,000 Units by mouth once a week.   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  Tobacco Use   Smoking status: Former    Current packs/day: 0.00    Average packs/day: 0.5 packs/day for 14.0 years (7.0 ttl pk-yrs)    Types: Cigarettes    Start date: 09/13/2003    Quit date: 09/12/2017    Years since quitting: 5.4   Smokeless tobacco: Never  Vaping Use   Vaping status: Never Used  Substance and Sexual Activity   Alcohol use: No   Drug use: No   Sexual activity: Not on file  Other Topics Concern   Not on file  Social History Narrative   Not on file   Social Determinants of Health   Financial Resource Strain: Not on file  Food  Insecurity: No Food Insecurity (01/10/2023)   Hunger Vital Sign    Worried About Running Out of Food in the Last Year: Never true    Ran Out of Food in the Last Year: Never true  Transportation Needs: Not on file  Physical Activity: Not on file  Stress: Stress Concern Present (09/14/2017)   [employer] of Occupational Health - Occupational Stress Questionnaire    Feeling of Stress : Very much  Social Connections: Not on file     Family History: The patient's family history includes Diabetes in her father; Heart disease in her father and mother; Hypertension in her mother. ROS:   Please see the history of present illness.    All 14 point review of systems negative except as described per history of present illness  EKGs/Labs/Other Studies Reviewed:         Recent Labs: 10/24/2022: NT-Pro BNP 210 01/09/2023: B Natriuretic Peptide 86.3; TSH 16.352 01/10/2023: Magnesium 1.8 01/18/2023: ALT 31; BUN 22; Creatinine 1.41; Hemoglobin 12.8; Platelets 350; Potassium 3.5; Sodium 135  Recent Lipid Panel    Component Value Date/Time   CHOL 224 (H)  02/25/2021 1322   TRIG 335 (H) 02/25/2021 1322   HDL 64 02/25/2021 1322   CHOLHDL 3.5 02/25/2021 1322   LDLCALC 104 (H) 02/25/2021 1322    Physical Exam:    VS:  BP (!) 142/90 (BP Location: Left Arm, Patient Position: Sitting)   Pulse 85   Ht 5\' 1"  (1.549 m)   Wt 275 lb (124.7 kg)   SpO2 95%   BMI 51.96 kg/m     Wt Readings from Last 3 Encounters:  03/09/23 275 lb (124.7 kg)  02/12/23 279 lb (126.6 kg)  01/25/23 279 lb (126.6 kg)     GEN:  Well nourished, well developed in no acute distress HEENT: Normal NECK: No JVD; No carotid bruits LYMPHATICS: No lymphadenopathy CARDIAC: RRR, no murmurs, no rubs, no gallops RESPIRATORY:  Clear to auscultation without rales, wheezing or rhonchi  ABDOMEN: Soft, non-tender, non-distended MUSCULOSKELETAL:  No edema; No deformity  SKIN: Warm and dry LOWER EXTREMITIES: no swelling NEUROLOGIC:  Alert and oriented x 3 PSYCHIATRIC:  Normal affect   ASSESSMENT:    1. DCM (dilated cardiomyopathy) (HCC)   2. Coronary artery disease involving native coronary artery of native heart without angina pectoris   3. Peripheral edema    PLAN:    In order of problems listed above:  Coronary disease doing well from that point review asymptomatic continue present management. History of dilated cardiomyopathy last echocardiogram showed normalization of the ventricle ejection fraction. Supraventricular tachycardia successfully controlled she is doing well. Pain in multiple joints.  She will refer to rheumatology.   Medication Adjustments/Labs and Tests Ordered: Current medicines are reviewed at length with the patient today.  Concerns regarding medicines are outlined above.  No orders of the defined types were placed in this encounter.  Medication changes: No orders of the defined types were placed in this encounter.   Signed, Georgeanna Lea, MD, [facility] 03/09/2023 1:55 PM    [facility]

## 2023-03-09 NOTE — Patient Instructions (Signed)
Medication Instructions:  Your physician recommends that you continue on your current medications as directed. Please refer to the Current Medication list given to you today.  *If you need a refill on your cardiac medications before your next appointment, please call your pharmacy*   Lab Work: None Ordered If you have labs (blood work) drawn today and your tests are completely normal, you will receive your results only by: MyChart Message (if you have MyChart) OR A paper copy in the mail If you have any lab test that is abnormal or we need to change your treatment, we will call you to review the results.   Testing/Procedures: None Ordered   Follow-Up: At [facility], you and your health needs are our priority.  As part of our continuing mission to provide you with exceptional heart care, we have created designated Provider Care Teams.  These Care Teams include your primary Cardiologist (physician) and Advanced Practice Providers (APPs -  Physician Assistants and Nurse Practitioners) who all work together to provide you with the care you need, when you need it.  We recommend signing up for the patient portal called "MyChart".  Sign up information is provided on this After Visit Summary.  MyChart is used to connect with patients for Virtual Visits (Telemedicine).  Patients are able to view lab/test results, encounter notes, upcoming appointments, etc.  Non-urgent messages can be sent to your provider as well.   To learn more about what you can do with MyChart, go to [email or web address].    Your next appointment:   3 month(s)  The format for your next appointment:   In Person  Provider:   Gypsy Balsam, MD    Other Instructions Referral to Rheumatology

## 2023-03-13 ENCOUNTER — Telehealth: Payer: Self-pay | Admitting: Cardiology

## 2023-03-13 ENCOUNTER — Telehealth: Payer: Self-pay

## 2023-03-13 MED ORDER — METOPROLOL SUCCINATE ER 25 MG PO TB24: 25.0000 mg | ORAL_TABLET | Freq: Every day | ORAL | 3 refills | Status: DC

## 2023-03-13 NOTE — Telephone Encounter (Signed)
Pt c/o medication issue:  1. Name of Medication:    2. How are you currently taking this medication (dosage and times per day)?    3. Are you having a reaction (difficulty breathing--STAT)? no  4. What is your medication issue? Calling to see if she can start taking medication again. Please advise

## 2023-03-13 NOTE — Telephone Encounter (Signed)
Pt calling want to be put on Metoprolol again. Dr. Bing Matter agreed with the understanding that she will stop the medications if her shortness of breath worsens.

## 2023-03-26 ENCOUNTER — Ambulatory Visit (INDEPENDENT_AMBULATORY_CARE_PROVIDER_SITE_OTHER): Payer: BC Managed Care – PPO | Admitting: Podiatry

## 2023-03-26 DIAGNOSIS — Z91199 Patient's noncompliance with other medical treatment and regimen due to unspecified reason: Secondary | ICD-10-CM

## 2023-03-26 NOTE — Progress Notes (Signed)
Patient absent for apointment

## 2023-04-02 ENCOUNTER — Other Ambulatory Visit: Payer: Self-pay | Admitting: Cardiology

## 2023-05-14 ENCOUNTER — Ambulatory Visit: Payer: MEDICAID | Admitting: Podiatry

## 2023-06-11 ENCOUNTER — Encounter: Payer: Self-pay | Admitting: Cardiology

## 2023-06-12 ENCOUNTER — Ambulatory Visit: Payer: MEDICAID | Attending: Cardiology | Admitting: Cardiology

## 2023-06-12 ENCOUNTER — Encounter: Payer: Self-pay | Admitting: Cardiology

## 2023-06-12 ENCOUNTER — Other Ambulatory Visit: Payer: Self-pay | Admitting: Cardiology

## 2023-06-12 VITALS — BP 152/74 | HR 88 | Ht 61.0 in | Wt 279.0 lb

## 2023-06-12 DIAGNOSIS — I255 Ischemic cardiomyopathy: Secondary | ICD-10-CM | POA: Diagnosis not present

## 2023-06-12 DIAGNOSIS — I10 Essential (primary) hypertension: Secondary | ICD-10-CM | POA: Diagnosis not present

## 2023-06-12 DIAGNOSIS — I2510 Atherosclerotic heart disease of native coronary artery without angina pectoris: Secondary | ICD-10-CM | POA: Diagnosis not present

## 2023-06-12 DIAGNOSIS — R0609 Other forms of dyspnea: Secondary | ICD-10-CM | POA: Diagnosis not present

## 2023-06-12 DIAGNOSIS — J9611 Chronic respiratory failure with hypoxia: Secondary | ICD-10-CM

## 2023-06-12 DIAGNOSIS — E785 Hyperlipidemia, unspecified: Secondary | ICD-10-CM

## 2023-06-12 MED ORDER — DILTIAZEM HCL ER COATED BEADS 300 MG PO CP24: 300.0000 mg | ORAL_CAPSULE | Freq: Every day | ORAL | 3 refills | Status: DC

## 2023-06-12 NOTE — Patient Instructions (Addendum)
Medication Instructions:   INCREASE: Cardizem to 30mg  daily   Lab Work: BMP, ProBNP- today If you have labs (blood work) drawn today and your tests are completely normal, you will receive your results only by: MyChart Message (if you have MyChart) OR A paper copy in the mail If you have any lab test that is abnormal or we need to change your treatment, we will call you to review the results.   Testing/Procedures: None Ordered   Follow-Up: At [facility], you and your health needs are our priority.  As part of our continuing mission to provide you with exceptional heart care, we have created designated Provider Care Teams.  These Care Teams include your primary Cardiologist (physician) and Advanced Practice Providers (APPs -  Physician Assistants and Nurse Practitioners) who all work together to provide you with the care you need, when you need it.  We recommend signing up for the patient portal called "MyChart".  Sign up information is provided on this After Visit Summary.  MyChart is used to connect with patients for Virtual Visits (Telemedicine).  Patients are able to view lab/test results, encounter notes, upcoming appointments, etc.  Non-urgent messages can be sent to your provider as well.   To learn more about what you can do with MyChart, go to [email or web address].    Your next appointment:   3 month(s)  The format for your next appointment:   In Person  Provider:   Gypsy Balsam, MD    Other Instructions NA

## 2023-06-12 NOTE — Progress Notes (Signed)
Cardiology Office Note:    Date:  06/12/2023   ID:  Katie Rasmussen, DOB February 14, 1971, MRN [id number]  PCP:  [name], FNP  Cardiologist:  Gypsy Balsam, MD    Referring MD: [name], *   No chief complaint on file.   History of Present Illness:    Katie Rasmussen is a 52 y.o. female  with past medical history that includes coronary artery disease she did have drug-eluting stent to proximal LAD in March 2019 in face of acute myocardial infarction, history of cardiomyopathy ejection fraction 30 to 35% however with appropriate guideline medical therapy normalized. She also got history of supraventricular tachycardia, morbid obesity, essential hypertension, asthma, obstructive sleep apnea. Comes today to months for follow-up overall cardiac wise seems to be doing well rare episode of palpitation that she is able to interrupt with some Valsalva maneuver.  Comes today to months for follow-up overall she seems to be doing well but said that her palpitations/slightly worse than before.  Also described to have a little more shortness of breath.  Denies of any chest pain tightness squeezing pressure burning chest.  Still complaining of having pain in joints in her lower extremities.  She did see rheumatologist but was told that this is just gout and her colchicine has been increased which did not help.  Past Medical History:  Diagnosis Date   Acute combined systolic and diastolic CHF, NYHA class 3 (HCC)    Acute on chronic respiratory failure with hypoxia and hypercapnia (HCC) 09/14/2017   Ankle sprain 02/18/2020   Asthma exacerbation 02/18/2020   Bronchitis due to tobacco use 08/2017   Chest pain 02/18/2020   Chronic respiratory failure (HCC)    CKD (chronic kidney disease), stage III (HCC)    COPD (chronic obstructive pulmonary disease) (HCC)    Coronary artery disease    DCM (dilated cardiomyopathy) (HCC)    Diastolic CHF (HCC)    Dyslipidemia  10/08/2017   Dyspnea on exertion 05/07/2018   Elevated troponin    Gout    Hypertension 02/18/2020   Hypothyroidism    h/o Grave's, s/p thyroidectomy & RAI in 1990s   Ischemic cardiomyopathy initial ejection fraction 35 to 40% in March 2019, normalization of ejection fraction based on echocardiogram from 2020 10/08/2017   Ejection fraction 35-40% in March 2019   Leukocytosis 02/18/2020   Obesity hypoventilation syndrome (HCC)    Orthostatic hypotension 02/18/2020   OSA (obstructive sleep apnea) 02/18/2020   Peripheral edema 02/18/2020   Pneumonia 08/2017   Postablative hypothyroidism 04/03/2019   Respiratory acidosis 02/18/2020   Sepsis (HCC) 02/18/2020   Supraventricular tachycardia (HCC) 07/08/2020   Systolic CHF with reduced left ventricular function, NYHA class 3 (HCC) 09/13/2017   Upper respiratory infection 02/18/2020   Weakness 02/18/2020    Past Surgical History:  Procedure Laterality Date   CESAREAN SECTION     CORONARY STENT INTERVENTION N/A 09/18/2017   Procedure: CORONARY STENT INTERVENTION;  Surgeon: Tonny Bollman, MD;  Location: [facility] INVASIVE CV LAB;  Service: Cardiovascular;  Laterality: N/A;  prox LAD   LEFT HEART CATH AND CORONARY ANGIOGRAPHY N/A 09/18/2017   Procedure: LEFT HEART CATH AND CORONARY ANGIOGRAPHY;  Surgeon: Tonny Bollman, MD;  Location: [facility] INVASIVE CV LAB;  Service: Cardiovascular;  Laterality: N/A;   THYROID SURGERY     s/p RAI for Grave's    Current Medications: Current Meds  Medication Sig   acetaminophen (TYLENOL) 500 MG tablet Take 1,000 mg by mouth every 6 (six) hours  as needed for mild pain or moderate pain.   albuterol (PROVENTIL) (2.5 MG/3ML) 0.083% nebulizer solution Take 3 mLs (2.5 mg total) by nebulization every 6 (six) hours as needed for wheezing or shortness of breath.   allopurinol (ZYLOPRIM) 100 MG tablet Take 1 tablet (100 mg total) by mouth daily.   ALPRAZolam (XANAX) 0.25 MG tablet Take 1 tablet (0.25 mg total) by mouth at bedtime as  needed for anxiety.   aspirin EC 81 MG EC tablet Take 1 tablet (81 mg total) by mouth daily.   atorvastatin (LIPITOR) 40 MG tablet Take 1 tablet (40 mg total) by mouth daily.   clopidogrel (PLAVIX) 75 MG tablet Take 1 tablet (75 mg total) by mouth daily with breakfast.   colchicine 0.6 MG tablet Take 1 tablet (0.6 mg total) by mouth at bedtime.   Cyanocobalamin (VITAMIN B-12) 1000 MCG SUBL Inject 1,000 mcg into the skin every 30 (thirty) days. ONE INJECTION DAILY X 7 DAYS, THEN ONE INJECTION WEEKLY X FOUR WEEKS, THEN ONE INJECTION MONTHLY INDEFINITELY   diltiazem (CARDIZEM CD) 300 MG 24 hr capsule Take 1 capsule (300 mg total) by mouth daily.   fluticasone furoate-vilanterol (BREO ELLIPTA) 200-25 MCG/INH AEPB Inhale 1 puff into the lungs daily.   levothyroxine (SYNTHROID) 200 MCG tablet Take 1 tablet (200 mcg total) by mouth every morning.   metolazone (ZAROXOLYN) 2.5 MG tablet Take 1 tablet (2.5 mg total) by mouth once a week.   metoprolol succinate (TOPROL XL) 25 MG 24 hr tablet Take 1 tablet (25 mg total) by mouth daily.   potassium chloride (KLOR-CON) 10 MEQ tablet Take 1 tablet (10 mEq total) by mouth daily.   SPIRIVA RESPIMAT 1.25 MCG/ACT AERS Inhale 1 each into the lungs daily.   torsemide (DEMADEX) 20 MG tablet Take 1 tablet (20 mg total) by mouth 2 (two) times daily.   VENTOLIN HFA 108 (90 Base) MCG/ACT inhaler INHALE 2 PUFFS BY MOUTH EVERY 4 HOURS ASNEEDED FOR SHORTNESS OF BREATH. (Patient taking differently: Inhale 2 puffs into the lungs every 4 (four) hours as needed for shortness of breath. INHALE 2 PUFFS BY MOUTH EVERY 4 HOURS ASNEEDED FOR SHORTNESS OF BREATH.)   Vitamin D, Ergocalciferol, (DRISDOL) 1.25 MG (50000 UNIT) CAPS capsule Take 50,000 Units by mouth once a week.   [DISCONTINUED] diltiazem (CARDIZEM) 30 MG tablet Take 1 tablet (30 mg total) by mouth every 6 (six) hours as needed FOR PALPITATIONS (Patient taking differently: Take 30 mg by mouth every 6 (six) hours as needed  (Palpitations).)   [DISCONTINUED] TIADYLT ER 240 MG 24 hr capsule Take 240 mg by mouth daily.   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  Tobacco Use   Smoking status: Former    Current packs/day: 0.00    Average packs/day: 0.5 packs/day for 14.0 years (7.0 ttl pk-yrs)    Types: Cigarettes    Start date: 09/13/2003    Quit date: 09/12/2017    Years since quitting: 5.7  Smokeless tobacco: Never  Vaping Use   Vaping status: Never Used  Substance and Sexual Activity   Alcohol use: No   Drug use: No   Sexual activity: Not on file  Other Topics Concern   Not on file  Social History Narrative   Not on file   Social Determinants of Health   Financial Resource Strain: Not on file  Food Insecurity: No Food Insecurity (01/10/2023)   Hunger Vital Sign    Worried About Running Out of Food in the Last Year: Never true    Ran Out of Food in the Last Year: Never true  Transportation Needs: Not on file  Physical Activity: Not on file  Stress: Stress Concern Present (09/14/2017)   [employer] of Occupational Health - Occupational Stress Questionnaire    Feeling of Stress : Very much  Social Connections: Not on file     Family History: The patient's family history includes Diabetes in her father; Heart disease in her father and mother; Hypertension in her mother. ROS:   Please see the history of present illness.    All 14 point review of systems negative except as described per history of  present illness  EKGs/Labs/Other Studies Reviewed:         Recent Labs: 10/24/2022: NT-Pro BNP 210 01/09/2023: B Natriuretic Peptide 86.3; TSH 16.352 01/10/2023: Magnesium 1.8 01/18/2023: ALT 31; BUN 22; Creatinine 1.41; Hemoglobin 12.8; Platelets 350; Potassium 3.5; Sodium 135  Recent Lipid Panel    Component Value Date/Time   CHOL 224 (H) 02/25/2021 1322   TRIG 335 (H) 02/25/2021 1322   HDL 64 02/25/2021 1322   CHOLHDL 3.5 02/25/2021 1322   LDLCALC 104 (H) 02/25/2021 1322    Physical Exam:    VS:  BP (!) 152/74   Pulse 88   Ht 5\' 1"  (1.549 m)   Wt 279 lb (126.6 kg)   SpO2 96%   BMI 52.72 kg/m     Wt Readings from Last 3 Encounters:  06/12/23 279 lb (126.6 kg)  03/09/23 275 lb (124.7 kg)  02/12/23 279 lb (126.6 kg)     GEN:  Well nourished, well developed in no acute distress HEENT: Normal NECK: No JVD; No carotid bruits LYMPHATICS: No lymphadenopathy CARDIAC: RRR, no murmurs, no rubs, no gallops RESPIRATORY:  Clear to auscultation without rales, wheezing or rhonchi  ABDOMEN: Soft, non-tender, non-distended MUSCULOSKELETAL:  No edema; No deformity  SKIN: Warm and dry LOWER EXTREMITIES: no swelling NEUROLOGIC:  Alert and oriented x 3 PSYCHIATRIC:  Normal affect   ASSESSMENT:    1. Dyspnea on exertion   2. Ischemic cardiomyopathy initial ejection fraction 35 to 40% in March 2019, normalization of ejection fraction based on echocardiogram from 2020   3. Coronary artery disease involving native coronary artery of native heart without angina pectoris   4. Primary hypertension   5. Dyslipidemia   6. Chronic respiratory failure with hypoxia (HCC)    PLAN:    In order of problems listed above:  Dyspnea on exertion multifactorial.  Will get proBNP make sure it is not related to her cardiomyopathy. History of ischemic cardiomyopathy but normalization. Coronary artery disease stable from that point review. Essential hypertension blood pressure well-controlled  continue present management. Palpitations: Will increase dose of Cardizem to 300 mg daily long-acting.   Medication Adjustments/Labs and Tests Ordered: Current medicines are reviewed at length with the patient today.  Concerns regarding medicines are outlined above.  Orders Placed This Encounter  Procedures   Basic metabolic  panel   Pro b natriuretic peptide (BNP)   Medication changes:  Meds ordered this encounter  Medications   diltiazem (CARDIZEM CD) 300 MG 24 hr capsule    Sig: Take 1 capsule (300 mg total) by mouth daily.    Dispense:  90 capsule    Refill:  3    Signed, Georgeanna Lea, MD, [facility] 06/12/2023 2:47 PM    [facility]

## 2023-06-13 LAB — BASIC METABOLIC PANEL
BUN/Creatinine Ratio: 11 (ref 9–23)
BUN: 13 mg/dL (ref 6–24)
CO2: 21 mmol/L (ref 20–29)
Calcium: 9.9 mg/dL (ref 8.7–10.2)
Chloride: 94 mmol/L — ABNORMAL LOW (ref 96–106)
Creatinine, Ser: 1.23 mg/dL — ABNORMAL HIGH (ref 0.57–1.00)
Glucose: 242 mg/dL — ABNORMAL HIGH (ref 70–99)
Potassium: 3.8 mmol/L (ref 3.5–5.2)
Sodium: 137 mmol/L (ref 134–144)
eGFR: 53 mL/min/{1.73_m2} — ABNORMAL LOW (ref >59)

## 2023-06-13 LAB — PRO B NATRIURETIC PEPTIDE: NT-Pro BNP: 449 pg/mL — ABNORMAL HIGH (ref 0–249)

## 2023-06-13 NOTE — Telephone Encounter (Signed)
RX sent

## 2023-06-20 ENCOUNTER — Telehealth: Payer: Self-pay

## 2023-06-20 DIAGNOSIS — I10 Essential (primary) hypertension: Secondary | ICD-10-CM

## 2023-06-20 NOTE — Telephone Encounter (Signed)
Left message on My Chart with Lab results per Dr. Vanetta Shawl note. Routed to PCP.

## 2023-06-20 NOTE — Addendum Note (Signed)
Addended by: Baldo Ash D on: 06/20/2023 01:13 PM   Modules accepted: Orders

## 2023-07-24 ENCOUNTER — Ambulatory Visit: Payer: Self-pay | Admitting: Hematology and Oncology

## 2023-07-24 ENCOUNTER — Inpatient Hospital Stay: Payer: MEDICAID | Attending: Hematology and Oncology

## 2023-07-24 ENCOUNTER — Other Ambulatory Visit: Payer: BC Managed Care – PPO

## 2023-07-24 ENCOUNTER — Encounter: Payer: Self-pay | Admitting: Hematology and Oncology

## 2023-07-24 ENCOUNTER — Inpatient Hospital Stay (HOSPITAL_BASED_OUTPATIENT_CLINIC_OR_DEPARTMENT_OTHER): Payer: MEDICAID | Admitting: Hematology and Oncology

## 2023-07-24 VITALS — BP 147/94 | HR 102 | Temp 98.3°F | Resp 24 | Wt 300.0 lb

## 2023-07-24 DIAGNOSIS — D509 Iron deficiency anemia, unspecified: Secondary | ICD-10-CM | POA: Diagnosis present

## 2023-07-24 DIAGNOSIS — D72829 Elevated white blood cell count, unspecified: Secondary | ICD-10-CM | POA: Insufficient documentation

## 2023-07-24 DIAGNOSIS — D72823 Leukemoid reaction: Secondary | ICD-10-CM

## 2023-07-24 DIAGNOSIS — E538 Deficiency of other specified B group vitamins: Secondary | ICD-10-CM | POA: Diagnosis not present

## 2023-07-24 DIAGNOSIS — D508 Other iron deficiency anemias: Secondary | ICD-10-CM

## 2023-07-24 LAB — CBC WITH DIFFERENTIAL (CANCER CENTER ONLY)
Abs Immature Granulocytes: 0.11 10*3/uL — ABNORMAL HIGH (ref 0.00–0.07)
Basophils Absolute: 0 10*3/uL (ref 0.0–0.1)
Basophils Relative: 1 %
Eosinophils Absolute: 0.3 10*3/uL (ref 0.0–0.5)
Eosinophils Relative: 4 %
HCT: 38.4 % (ref 36.0–46.0)
Hemoglobin: 12.7 g/dL (ref 12.0–15.0)
Immature Granulocytes: 2 %
Lymphocytes Relative: 22 %
Lymphs Abs: 1.5 10*3/uL (ref 0.7–4.0)
MCH: 29.6 pg (ref 26.0–34.0)
MCHC: 33.1 g/dL (ref 30.0–36.0)
MCV: 89.5 fL (ref 80.0–100.0)
Monocytes Absolute: 0.5 10*3/uL (ref 0.1–1.0)
Monocytes Relative: 7 %
Neutro Abs: 4.3 10*3/uL (ref 1.7–7.7)
Neutrophils Relative %: 64 %
Platelet Count: 270 10*3/uL (ref 150–400)
RBC: 4.29 MIL/uL (ref 3.87–5.11)
RDW: 15.6 % — ABNORMAL HIGH (ref 11.5–15.5)
WBC Count: 6.6 10*3/uL (ref 4.0–10.5)
nRBC: 0 % (ref 0.0–0.2)
nRBC: 0 /100{WBCs}

## 2023-07-24 LAB — IRON AND TIBC
Iron: 60 ug/dL (ref 28–170)
Saturation Ratios: 19 % (ref 10.4–31.8)
TIBC: 325 ug/dL (ref 250–450)
UIBC: 265 ug/dL

## 2023-07-24 LAB — FERRITIN: Ferritin: 59 ng/mL (ref 11–307)

## 2023-07-24 LAB — VITAMIN B12: Vitamin B-12: 164 pg/mL — ABNORMAL LOW (ref 180–914)

## 2023-07-24 LAB — FOLATE: Folate: 9.1 ng/mL (ref >5.9)

## 2023-07-24 NOTE — Progress Notes (Cosign Needed)
 [facility]  [address] [place],  [place]  [address] [phone]  Clinic Day:  07/24/2023  Referring physician: Trudy Wanda MATSU, *   HISTORY OF PRESENT ILLNESS:  The patient is a 53 y.o. female with a history of anemia secondary to both iron and B12 deficiency. She has received IV iron in the past; she was also taking monthly B12 injections to ensure adequate fortification of her iron stores. She was referred back again in July 2024 due to leukocytosis. This was felt to be reactive due her being on prednisone  to treat gout. At that time, her B12 was found to be low again, so she was placed back on B12 injections monthly.  She is here today for repeat clinical assessment. She has not been taking B12 injections at home. She states she completed a course of prednisone  for another flare of her gout about 5 days ago. She reports continued bilateral leg pain, which she rates 5/10 today. She is restricted to a wheelchair.  PHYSICAL EXAM:  Blood pressure (!) 147/94, pulse (!) 102, temperature 98.3 F (36.8 C), temperature source Oral, resp. rate (!) 24, weight 300 lb (136.1 kg), last menstrual period 09/15/2017, SpO2 94%. Wt Readings from Last 3 Encounters:  07/24/23 300 lb (136.1 kg)  06/12/23 279 lb (126.6 kg)  03/09/23 275 lb (124.7 kg)   Body mass index is 56.68 kg/m.  Performance status (ECOG): 3 - Symptomatic, >50% confined to bed  Physical Exam Vitals and nursing note reviewed.  Constitutional:      General: She is not in acute distress.    Appearance: She is obese. She is ill-appearing (chronically ill-appearing).  HENT:     Head: Normocephalic and atraumatic.     Mouth/Throat:     Mouth: Mucous membranes are moist.     Pharynx: Oropharynx is clear. No oropharyngeal exudate or posterior oropharyngeal erythema.  Eyes:     General: No scleral icterus.    Extraocular Movements: Extraocular movements intact.     Conjunctiva/sclera: Conjunctivae normal.      Pupils: Pupils are equal, round, and reactive to light.  Cardiovascular:     Rate and Rhythm: Normal rate and regular rhythm.     Heart sounds: Normal heart sounds. No murmur heard.    No friction rub. No gallop.  Pulmonary:     Effort: Pulmonary effort is normal.     Breath sounds: Normal breath sounds. No wheezing, rhonchi or rales.  Abdominal:     General: There is no distension.     Palpations: Abdomen is soft. There is no mass.     Tenderness: There is no abdominal tenderness.  Musculoskeletal:        General: Normal range of motion.     Cervical back: Normal range of motion and neck supple. No tenderness.     Right lower leg: No edema.     Left lower leg: No edema.  Lymphadenopathy:     Cervical: No cervical adenopathy.     Upper Body:     Right upper body: No supraclavicular or axillary adenopathy.     Left upper body: No supraclavicular or axillary adenopathy.  Skin:    General: Skin is warm and dry.     Coloration: Skin is not jaundiced.     Findings: No rash.  Neurological:     Mental Status: She is alert and oriented to person, place, and time.     Cranial Nerves: No cranial nerve deficit.  Comments: Uses wheelchair, unable to stand for height and weight  Psychiatric:        Mood and Affect: Mood is anxious. Affect is tearful.        Behavior: Behavior normal.        Thought Content: Thought content normal.    LABS:      Latest Ref Rng & Units 07/24/2023   10:40 AM 01/18/2023   12:00 AM 01/13/2023   12:34 AM  CBC  WBC 4.0 - 10.5 K/uL 6.6  13.8     11.7   Hemoglobin 12.0 - 15.0 g/dL 87.2  87.1     88.9   Hematocrit 36.0 - 46.0 % 38.4  38     33.7   Platelets 150 - 400 K/uL 270  350     258      This result is from an external source.      Latest Ref Rng & Units 06/12/2023    2:44 PM 01/18/2023    3:03 PM 01/13/2023   12:34 AM  CMP  Glucose 70 - 99 mg/dL 757  857  772   BUN 6 - 24 mg/dL 13  22  14    Creatinine 0.57 - 1.00 mg/dL 8.76  8.58  8.24    Sodium 134 - 144 mmol/L 137  135  135   Potassium 3.5 - 5.2 mmol/L 3.8  3.5  4.0   Chloride 96 - 106 mmol/L 94  98  99   CO2 20 - 29 mmol/L 21  22  23    Calcium  8.7 - 10.2 mg/dL 9.9  9.3  9.0   Total Protein 6.5 - 8.1 g/dL  7.6    Total Bilirubin 0.3 - 1.2 mg/dL  0.5    Alkaline Phos 38 - 126 U/L  104    AST 15 - 41 U/L  37    ALT 0 - 44 U/L  31       Lab Results  Component Value Date   TIBC 325 07/24/2023   TIBC 409 01/18/2023   FERRITIN 59 07/24/2023   FERRITIN 30 01/18/2023   IRONPCTSAT 19 07/24/2023   IRONPCTSAT 11 01/18/2023     Review Flowsheet       Latest Ref Rng & Units 01/18/2023 07/24/2023  Oncology Labs  Ferritin 11 - 307 ng/mL 30  59   %SAT 10.4 - 31.8 % 11  19      STUDIES:  No results found.    ASSESSMENT & PLAN:   Assessment/Plan:  53 y.o. female with a history of leukocytosis felt to be reactive due to prednisone  treatment of gout. She also has a history of B12 and iron deficiency. Her WBC's are normal off of prednisone .  She does not have evidence of recurrent iron deficiency. Her B12 is once again low.  I recommend another protracted course of vitamin B12 and continuing monthly indefinitely. Her PCP can continue the B12 injections. I will not schedule a routine follow up in our office.  We will be glad to see the patient back in the future as needed. The patient understands all the plans discussed today and is in agreement with them.  She knows to contact our office if she develops concerns prior to her next appointment.     Andrez DELENA Foy, PA-C   Physician Assistant [facility] [place] [phone]

## 2023-07-26 ENCOUNTER — Telehealth: Payer: Self-pay

## 2023-07-26 DIAGNOSIS — E1169 Type 2 diabetes mellitus with other specified complication: Secondary | ICD-10-CM

## 2023-07-26 DIAGNOSIS — E559 Vitamin D deficiency, unspecified: Secondary | ICD-10-CM | POA: Insufficient documentation

## 2023-07-26 HISTORY — DX: Vitamin D deficiency, unspecified: E55.9

## 2023-07-26 HISTORY — DX: Type 2 diabetes mellitus with other specified complication: E11.69

## 2023-07-26 MED ORDER — CYANOCOBALAMIN 1000 MCG/ML IJ SOLN: INTRAMUSCULAR | 0 refills | Status: AC

## 2023-07-26 NOTE — Telephone Encounter (Signed)
The prescription was listed as dispense 1 mL. Lillia Abed from [facility] wanted to know if [facility], wanted to give 12 mL to cover the prescription loading directions?  Kelli,PA, agreed to dispense 12mL.

## 2023-09-13 ENCOUNTER — Ambulatory Visit (INDEPENDENT_AMBULATORY_CARE_PROVIDER_SITE_OTHER): Payer: MEDICAID | Admitting: Podiatry

## 2023-09-13 DIAGNOSIS — B351 Tinea unguium: Secondary | ICD-10-CM | POA: Diagnosis not present

## 2023-09-13 DIAGNOSIS — M10372 Gout due to renal impairment, left ankle and foot: Secondary | ICD-10-CM

## 2023-09-13 DIAGNOSIS — M79674 Pain in right toe(s): Secondary | ICD-10-CM | POA: Diagnosis not present

## 2023-09-13 DIAGNOSIS — L609 Nail disorder, unspecified: Secondary | ICD-10-CM | POA: Diagnosis not present

## 2023-09-13 DIAGNOSIS — M79675 Pain in left toe(s): Secondary | ICD-10-CM

## 2023-09-13 MED ORDER — CICLOPIROX 8 % EX SOLN: Freq: Every day | CUTANEOUS | 11 refills | Status: DC

## 2023-09-13 NOTE — Progress Notes (Signed)
 Subjective:  Patient ID: Katie Rasmussen, female    DOB: [birth year],  MRN: [id number]  [name] presents to clinic today for:  Chief Complaint  Patient presents with   Nail Problem    Bilateral fungal nails. The right 3rd nail looks like it is tring to come lose from the cuticle. Nails are discolored and brittle looking. Last A1c was 9.2 yesterday and takes ASA 81 and Plavix. She also stated that she got shots in her feet from a "bone doctor" two weeks ago.    Patient notes nails are thick, discolored, elongated and painful in shoegear when trying to ambulate.  Patient notes that she recently saw Dr. Marylene Land for a left ankle gout flareup.  She received a corticosteroid injection in the left ankle and states that she is doing better.  She is not taking colchicine, noting she was told this will interact with her kidneys so they are trying to avoid this medication.  She was not familiar with Uloric when questioned regarding this medication.  She also notes concern about a lifting right third toenail which looks like it is about to fall off.  Denies trauma.  Denies drainage  PCP is [name], FNP.  Past Medical History:  Diagnosis Date   Acute combined systolic and diastolic CHF, NYHA class 3 (HCC)    Acute on chronic respiratory failure with hypoxia and hypercapnia (HCC) 09/14/2017   Ankle sprain 02/18/2020   Asthma exacerbation 02/18/2020   Bronchitis due to tobacco use 08/2017   Chest pain 02/18/2020   Chronic respiratory failure (HCC)    CKD (chronic kidney disease), stage III (HCC)    COPD (chronic obstructive pulmonary disease) (HCC)    Coronary artery disease    DCM (dilated cardiomyopathy) (HCC)    Diastolic CHF (HCC)    Dyslipidemia 10/08/2017   Dyspnea on exertion 05/07/2018   Elevated troponin    Gout    Hypertension 02/18/2020   Hypothyroidism    h/o Grave's, s/p thyroidectomy & RAI in 1990s   Ischemic cardiomyopathy initial ejection  fraction 35 to 40% in March 2019, normalization of ejection fraction based on echocardiogram from 2020 10/08/2017   Ejection fraction 35-40% in March 2019   Leukocytosis 02/18/2020   Obesity hypoventilation syndrome (HCC)    Orthostatic hypotension 02/18/2020   OSA (obstructive sleep apnea) 02/18/2020   Peripheral edema 02/18/2020   Pneumonia 08/2017   Postablative hypothyroidism 04/03/2019   Respiratory acidosis 02/18/2020   Sepsis (HCC) 02/18/2020   Supraventricular tachycardia (HCC) 07/08/2020   Systolic CHF with reduced left ventricular function, NYHA class 3 (HCC) 09/13/2017   Upper respiratory infection 02/18/2020   Weakness 02/18/2020    Past Surgical History:  Procedure Laterality Date   CESAREAN SECTION     CORONARY STENT INTERVENTION N/A 09/18/2017   Procedure: CORONARY STENT INTERVENTION;  Surgeon: Tonny Bollman, MD;  Location: [facility] INVASIVE CV LAB;  Service: Cardiovascular;  Laterality: N/A;  prox LAD   LEFT HEART CATH AND CORONARY ANGIOGRAPHY N/A 09/18/2017   Procedure: LEFT HEART CATH AND CORONARY ANGIOGRAPHY;  Surgeon: Tonny Bollman, MD;  Location: [facility] INVASIVE CV LAB;  Service: Cardiovascular;  Laterality: N/A;   THYROID SURGERY     s/p RAI for Grave's    Allergies  Allergen Reactions   Enoxaparin Sodium Other (See Comments)    Patient stated it was added to her chart in 2019 after her MI. She stated she was not alert enough to remember why it  was added   Iodine Hives and Itching   Sulfa Antibiotics Other (See Comments)   Shellfish Allergy Hives and Itching    Review of Systems: Negative except as noted in the HPI.  Objective:  Katie Rasmussen is a pleasant 53 y.o. female in NAD. AAO x 3.  Vascular Examination: Capillary refill time is 3-5 seconds to toes bilateral. Palpable pedal pulses b/l LE. Digital hair present b/l.  Skin temperature gradient WNL b/l. No varicosities b/l. No cyanosis noted b/l.   Dermatological Examination: Pedal skin with normal  turgor, texture and tone b/l. No open wounds. No interdigital macerations b/l. Toenails x10 are 3mm thick, discolored, dystrophic with subungual debris. There is pain with compression of the nail plates.  They are elongated x10.  The right third toenail near the eponychium is a lytic.  The distal 50% is well adhered to the underlying nailbed.  There is no surrounding erythema.  Assessment/Plan: 1. Pain due to onychomycosis of toenails of both feet   2. Nail abnormality     Meds ordered this encounter  Medications   ciclopirox (PENLAC) 8 % solution    Sig: Apply topically at bedtime. Apply over nail. Apply daily over previous coat. Remove weekly with polish remover.    Dispense:  6.6 mL    Refill:  11   The mycotic toenails were sharply debrided x10 with sterile nail nippers and a power debriding burr to decrease bulk/thickness and length.    The right third toenail was filed along the loose edge near the eponychium so that this does not catch on her sock and pulled the nail off.  The new nail will start to push this existing nail out over time.  No further treatment was necessary.  Discussed starting topical treatment of the nail fungus.  Will send in prescription for ciclopirox to apply to the nails daily for up to 1 year.  Patient to discuss Uloric with Dr. Marylene Land at a future visit since she will be following up with her regarding the ankle gout.  Uloric is less damaging to the kidneys versus colchicine, but can also be dose adjusted in stage IV and V kidney failure.  Return in about 3 months (around 12/14/2023) for  [facility]   Clerance Lav, DPM, FACFAS [facility]     [address] [place], [place] [address]                Office [phone]  Fax [phone]

## 2023-09-26 ENCOUNTER — Ambulatory Visit: Payer: MEDICAID | Attending: Cardiology | Admitting: Cardiology

## 2023-09-26 ENCOUNTER — Encounter: Payer: Self-pay | Admitting: Cardiology

## 2023-09-26 VITALS — BP 138/86 | HR 97 | Ht 61.0 in | Wt 256.0 lb

## 2023-09-26 DIAGNOSIS — I471 Supraventricular tachycardia, unspecified: Secondary | ICD-10-CM | POA: Diagnosis not present

## 2023-09-26 DIAGNOSIS — I2510 Atherosclerotic heart disease of native coronary artery without angina pectoris: Secondary | ICD-10-CM

## 2023-09-26 DIAGNOSIS — E785 Hyperlipidemia, unspecified: Secondary | ICD-10-CM

## 2023-09-26 DIAGNOSIS — E1169 Type 2 diabetes mellitus with other specified complication: Secondary | ICD-10-CM | POA: Diagnosis not present

## 2023-09-26 DIAGNOSIS — I420 Dilated cardiomyopathy: Secondary | ICD-10-CM | POA: Diagnosis not present

## 2023-09-26 NOTE — Progress Notes (Unsigned)
 Cardiology Office Note:    Date:  09/26/2023   ID:  Katie Rasmussen, DOB 22-Sep-1970, MRN [id number]  PCP:  [name], FNP  Cardiologist:  Gypsy Balsam, MD    Referring MD: [name], *   Chief Complaint  Patient presents with   Follow-up    History of Present Illness:    Katie Rasmussen is a 53 y.o. female   with past medical history that includes coronary artery disease she did have drug-eluting stent to proximal LAD in March 2019 in face of acute myocardial infarction, history of cardiomyopathy ejection fraction 30 to 35% however with appropriate guideline medical therapy normalized. She also got history of supraventricular tachycardia, morbid obesity, essential hypertension, asthma, obstructive sleep apnea.  Comes today to months for follow-up overall she doing very well.  She lost 44 pounds.  Sadly it is accomplished by Ozempic and she described to have some vomiting and have to take some antivomiting medication also diarrhea but overall feels much better.  Swelling is gone shortness of breath, there is no wheezes she is really looks good.  Recently she got some issue with a gout also a lot of blood test done by primary care physician a lot of abnormality identified including low vitamin D D3 all being supplemented she is doing better  Past Medical History:  Diagnosis Date   Acute combined systolic and diastolic CHF, NYHA class 3 (HCC)    Acute on chronic respiratory failure with hypoxia and hypercapnia (HCC) 09/14/2017   Ankle sprain 02/18/2020   Asthma exacerbation 02/18/2020   Bronchitis due to tobacco use 08/2017   Chest pain 02/18/2020   Chronic respiratory failure (HCC)    CKD (chronic kidney disease), stage III (HCC)    COPD (chronic obstructive pulmonary disease) (HCC)    Coronary artery disease    DCM (dilated cardiomyopathy) (HCC)    Diastolic CHF (HCC)    Dyslipidemia 10/08/2017   Dyspnea on exertion 05/07/2018   Elevated  troponin    Gout    Hypertension 02/18/2020   Hypothyroidism    h/o Grave's, s/p thyroidectomy & RAI in 1990s   Ischemic cardiomyopathy initial ejection fraction 35 to 40% in March 2019, normalization of ejection fraction based on echocardiogram from 2020 10/08/2017   Ejection fraction 35-40% in March 2019   Leukocytosis 02/18/2020   Obesity hypoventilation syndrome (HCC)    Orthostatic hypotension 02/18/2020   OSA (obstructive sleep apnea) 02/18/2020   Peripheral edema 02/18/2020   Pneumonia 08/2017   Postablative hypothyroidism 04/03/2019   Respiratory acidosis 02/18/2020   Sepsis (HCC) 02/18/2020   Supraventricular tachycardia (HCC) 07/08/2020   Systolic CHF with reduced left ventricular function, NYHA class 3 (HCC) 09/13/2017   Upper respiratory infection 02/18/2020   Weakness 02/18/2020    Past Surgical History:  Procedure Laterality Date   CESAREAN SECTION     CORONARY STENT INTERVENTION N/A 09/18/2017   Procedure: CORONARY STENT INTERVENTION;  Surgeon: Tonny Bollman, MD;  Location: [facility] INVASIVE CV LAB;  Service: Cardiovascular;  Laterality: N/A;  prox LAD   LEFT HEART CATH AND CORONARY ANGIOGRAPHY N/A 09/18/2017   Procedure: LEFT HEART CATH AND CORONARY ANGIOGRAPHY;  Surgeon: Tonny Bollman, MD;  Location: [facility] INVASIVE CV LAB;  Service: Cardiovascular;  Laterality: N/A;   THYROID SURGERY     s/p RAI for Grave's    Current Medications: Current Meds  Medication Sig   acetaminophen (TYLENOL) 500 MG tablet Take 1,000 mg by mouth every 6 (six) hours as needed for  mild pain or moderate pain.   albuterol (PROVENTIL) (2.5 MG/3ML) 0.083% nebulizer solution Take 3 mLs (2.5 mg total) by nebulization every 6 (six) hours as needed for wheezing or shortness of breath.   allopurinol (ZYLOPRIM) 100 MG tablet Take 1 tablet (100 mg total) by mouth daily.   ALPRAZolam (XANAX) 0.25 MG tablet Take 1 tablet (0.25 mg total) by mouth at bedtime as needed for anxiety.   aspirin EC 81 MG EC tablet Take 1  tablet (81 mg total) by mouth daily.   ciclopirox (PENLAC) 8 % solution Apply topically at bedtime. Apply over nail. Apply daily over previous coat. Remove weekly with polish remover. (Patient taking differently: Apply 1 application  topically at bedtime. Apply over nail. Apply daily over previous coat. Remove weekly with polish remover.)   clopidogrel (PLAVIX) 75 MG tablet Take 1 tablet (75 mg total) by mouth daily with breakfast.   colchicine 0.6 MG tablet Take 1 tablet (0.6 mg total) by mouth at bedtime.   cyanocobalamin (VITAMIN B12) 1000 MCG/ML injection Inject 1 mL (1,000 mcg total) into the skin daily for 7 days, THEN 1 mL (1,000 mcg total) once a week for 28 days, THEN 1 mL (1,000 mcg total) every 30 (thirty) days.   diltiazem (TIAZAC) 240 MG 24 hr capsule Take 240 mg by mouth daily.   fluticasone furoate-vilanterol (BREO ELLIPTA) 200-25 MCG/INH AEPB Inhale 1 puff into the lungs daily.   levothyroxine (SYNTHROID) 200 MCG tablet Take 1 tablet (200 mcg total) by mouth every morning.   metolazone (ZAROXOLYN) 2.5 MG tablet Take 1 tablet (2.5 mg total) by mouth once a week.   ondansetron (ZOFRAN) 4 MG tablet Take 4 mg by mouth every 6 (six) hours as needed for nausea or vomiting.   OZEMPIC, 0.25 OR 0.5 MG/DOSE, 2 MG/3ML SOPN Inject 0.25 mg into the skin once a week.   potassium chloride (KLOR-CON) 10 MEQ tablet Take 1 tablet (10 mEq total) by mouth daily.   rosuvastatin (CRESTOR) 20 MG tablet Take 20 mg by mouth at bedtime.   SPIRIVA RESPIMAT 1.25 MCG/ACT AERS Inhale 1 each into the lungs daily.   torsemide (DEMADEX) 20 MG tablet Take 1 tablet by mouth 2 times daily.   VENTOLIN HFA 108 (90 Base) MCG/ACT inhaler INHALE 2 PUFFS BY MOUTH EVERY 4 HOURS ASNEEDED FOR SHORTNESS OF BREATH. (Patient taking differently: Inhale 2 puffs into the lungs every 4 (four) hours as needed for shortness of breath. INHALE 2 PUFFS BY MOUTH EVERY 4 HOURS ASNEEDED FOR SHORTNESS OF BREATH.)   Vitamin D, Ergocalciferol,  (DRISDOL) 1.25 MG (50000 UNIT) CAPS capsule Take 50,000 Units by mouth once a week.   [DISCONTINUED] glipiZIDE (GLUCOTROL) 5 MG tablet Take 5 mg by mouth 2 (two) times daily.   [DISCONTINUED] predniSONE (DELTASONE) 10 MG tablet Take 10 mg by mouth daily with breakfast.   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  Tobacco Use   Smoking status: Former    Current packs/day:  0.00    Average packs/day: 0.5 packs/day for 14.0 years (7.0 ttl pk-yrs)    Types: Cigarettes    Start date: 09/13/2003    Quit date: 09/12/2017    Years since quitting: 6.0   Smokeless tobacco: Never  Vaping Use   Vaping status: Never Used  Substance and Sexual Activity   Alcohol use: No   Drug use: No   Sexual activity: Not on file  Other Topics Concern   Not on file  Social History Narrative   Not on file   Social Drivers of Health   Financial Resource Strain: Not on file  Food Insecurity: No Food Insecurity (01/10/2023)   Hunger Vital Sign    Worried About Running Out of Food in the Last Year: Never true    Ran Out of Food in the Last Year: Never true  Transportation Needs: Not on file  Physical Activity: Not on file  Stress: Stress Concern Present (09/14/2017)   [employer] of Occupational Health - Occupational Stress Questionnaire    Feeling of Stress : Very much  Social Connections: Not on file     Family History: The patient's family history includes Diabetes in her father; Heart disease in her father  and mother; Hypertension in her mother. ROS:   Please see the history of present illness.    All 14 point review of systems negative except as described per history of present illness  EKGs/Labs/Other Studies Reviewed:         Recent Labs: 01/09/2023: B Natriuretic Peptide 86.3; TSH 16.352 01/10/2023: Magnesium 1.8 01/18/2023: ALT 31 06/12/2023: BUN 13; Creatinine, Ser 1.23; NT-Pro BNP 449; Potassium 3.8; Sodium 137 07/24/2023: Hemoglobin 12.7; Platelet Count 270  Recent Lipid Panel    Component Value Date/Time   CHOL 224 (H) 02/25/2021 1322   TRIG 335 (H) 02/25/2021 1322   HDL 64 02/25/2021 1322   CHOLHDL 3.5 02/25/2021 1322   LDLCALC 104 (H) 02/25/2021 1322    Physical Exam:    VS:  BP 138/86 (BP Location: Right Arm, Patient Position: Sitting)   Pulse 97   Ht 5\' 1"  (1.549 m)   Wt 256 lb (116.1 kg)   LMP 09/15/2017   SpO2 98%   BMI 48.37 kg/m     Wt Readings from Last 3 Encounters:  09/26/23 256 lb (116.1 kg)  07/24/23 300 lb (136.1 kg)  06/12/23 279 lb (126.6 kg)     GEN:  Well nourished, well developed in no acute distress HEENT: Normal NECK: No JVD; No carotid bruits LYMPHATICS: No lymphadenopathy CARDIAC: RRR, no murmurs, no rubs, no gallops RESPIRATORY:  Clear to auscultation without rales, wheezing or rhonchi  ABDOMEN: Soft, non-tender, non-distended MUSCULOSKELETAL:  No edema; No deformity  SKIN: Warm and dry LOWER EXTREMITIES: no swelling NEUROLOGIC:  Alert and oriented x 3 PSYCHIATRIC:  Normal affect   ASSESSMENT:    1. Coronary artery disease involving native coronary artery of native heart without angina pectoris   2. DCM (dilated cardiomyopathy) (HCC)   3. Supraventricular tachycardia (HCC)   4. Type 2 diabetes mellitus with other specified complication, without long-term current use of insulin (HCC)   5. Dyslipidemia    PLAN:    In order of problems listed above:  Coronary disease stable from that point review denies have any chest pain  tightness squeezing pressure burning chest. Diabetes hemoglobin A1c still elevated from last numbers 10.1 and she is working with primary care physician she is thinking about switching her primary care physician again.  Supraventricular tachycardia stable. History of cardiomyopathy, denies having any evidence of CHF.  Last echocardiogram showed normalization of left ventricular ejection fraction. Dyslipidemia call primary care physician to get fasting lipid profile   Medication Adjustments/Labs and Tests Ordered: Current medicines are reviewed at length with the patient today.  Concerns regarding medicines are outlined above.  No orders of the defined types were placed in this encounter.  Medication changes: No orders of the defined types were placed in this encounter.   Signed, Georgeanna Lea, MD, [facility] 09/26/2023 1:33 PM    [facility]

## 2023-09-26 NOTE — Patient Instructions (Signed)
Medication Instructions:  Your physician recommends that you continue on your current medications as directed. Please refer to the Current Medication list given to you today.  *If you need a refill on your cardiac medications before your next appointment, please call your pharmacy*   Lab Work: None Ordered If you have labs (blood work) drawn today and your tests are completely normal, you will receive your results only by: MyChart Message (if you have MyChart) OR A paper copy in the mail If you have any lab test that is abnormal or we need to change your treatment, we will call you to review the results.   Testing/Procedures: None Ordered   Follow-Up: At [facility], you and your health needs are our priority.  As part of our continuing mission to provide you with exceptional heart care, we have created designated Provider Care Teams.  These Care Teams include your primary Cardiologist (physician) and Advanced Practice Providers (APPs -  Physician Assistants and Nurse Practitioners) who all work together to provide you with the care you need, when you need it.  We recommend signing up for the patient portal called "MyChart".  Sign up information is provided on this After Visit Summary.  MyChart is used to connect with patients for Virtual Visits (Telemedicine).  Patients are able to view lab/test results, encounter notes, upcoming appointments, etc.  Non-urgent messages can be sent to your provider as well.   To learn more about what you can do with MyChart, go to [email or web address].    Your next appointment:   5 month(s)  The format for your next appointment:   In Person  Provider:   Robert Krasowski, MD    Other Instructions NA  

## 2023-11-13 ENCOUNTER — Encounter: Payer: Self-pay | Admitting: Cardiology

## 2023-11-14 ENCOUNTER — Encounter: Payer: Self-pay | Admitting: Cardiology

## 2023-11-14 ENCOUNTER — Ambulatory Visit: Payer: MEDICAID

## 2023-11-14 ENCOUNTER — Ambulatory Visit: Payer: MEDICAID | Attending: Cardiology | Admitting: Cardiology

## 2023-11-14 VITALS — BP 146/88 | HR 76 | Ht 61.0 in | Wt 246.2 lb

## 2023-11-14 DIAGNOSIS — K5901 Slow transit constipation: Secondary | ICD-10-CM

## 2023-11-14 DIAGNOSIS — I471 Supraventricular tachycardia, unspecified: Secondary | ICD-10-CM

## 2023-11-14 DIAGNOSIS — E1169 Type 2 diabetes mellitus with other specified complication: Secondary | ICD-10-CM

## 2023-11-14 DIAGNOSIS — I2510 Atherosclerotic heart disease of native coronary artery without angina pectoris: Secondary | ICD-10-CM | POA: Diagnosis not present

## 2023-11-14 DIAGNOSIS — I420 Dilated cardiomyopathy: Secondary | ICD-10-CM | POA: Diagnosis not present

## 2023-11-14 NOTE — Patient Instructions (Addendum)
 Medication Instructions:  Your physician recommends that you continue on your current medications as directed. Please refer to the Current Medication list given to you today.  *If you need a refill on your cardiac medications before your next appointment, please call your pharmacy*   Lab Work: None Ordered If you have labs (blood work) drawn today and your tests are completely normal, you will receive your results only by: MyChart Message (if you have MyChart) OR A paper copy in the mail If you have any lab test that is abnormal or we need to change your treatment, we will call you to review the results.   Testing/Procedures: A zio monitor was ordered today. It will remain on for 14 days. Remove 11/28/2023. You will then return monitor and event diary in provided box. It takes 1-2 weeks for report to be downloaded and returned to us . We will call you with the results. If monitor falls off or has orange flashing light, please call Zio for further instructions.     Follow-Up: At [facility], you and your health needs are our priority.  As part of our continuing mission to provide you with exceptional heart care, we have created designated Provider Care Teams.  These Care Teams include your primary Cardiologist (physician) and Advanced Practice Providers (APPs -  Physician Assistants and Nurse Practitioners) who all work together to provide you with the care you need, when you need it.  We recommend signing up for the patient portal called "MyChart".  Sign up information is provided on this After Visit Summary.  MyChart is used to connect with patients for Virtual Visits (Telemedicine).  Patients are able to view lab/test results, encounter notes, upcoming appointments, etc.  Non-urgent messages can be sent to your provider as well.   To learn more about what you can do with MyChart, go to [email or web address].    Your next appointment:   3 month follow up with Dr. Krasowski    Take  Miralax  as needed and drink plenty of water

## 2023-11-14 NOTE — Progress Notes (Signed)
 Cardiology Office Note:  .   Date:  11/14/2023  ID:  Katie Rasmussen, DOB 01-20-1971, MRN [id number] PCP: Dara Ear, FNP   HeartCare Providers Cardiologist:  Ralene Burger, MD Electrophysiologist:  Lei Pump, MD    History of Present Illness: .   Katie Rasmussen is a 53 y.o. female with a past medical history of dilated cardiomyopathy, HFrEF, CAD s/p DES to proximal LAD March 2019 s/p MI, hypertension, SVT, atrial fibrillation, chronic venous insufficiency, COPD, pulmonary fibrosis, hypothyroidism, CKD stage III--follows with nephrology, gout, dyslipidemia, OSA on CPAP, BMI greater than 50.  01/10/2023 echo EF 60 to 65%, technically difficult study, mild aortic stenosis 04/05/2022 echo EF 60 to 65% 09/27/2017 DES to proximal LAD  Most recently admitted to the hospital on 01/09/2023 to 01/13/2023 with SVT, acute on chronic gout flare, UTI, chronic diastolic heart failure.  It was unclear if she was in SVT versus A-fib with RVR so EP was consulted and it was felt that her EKG had possibly 2 different SVTs, suspect AVNRT and atrial tachycardia.  Her beta-blocker has been stopped secondary to pulmonary disease, cardiology increased her diltiazem  to 240 mg daily, no possibility for ablation secondary to BMI.  Most recently she was evaluated by Dr. Gordan Latina on 09/26/2023, doing well from a cardiac perspective, she had been working on lifestyle modifications and has lost 44 pounds with the assistance of Ozempic, no changes were made to her plan of care she was advised follow-up in 5 months.  She presents today accompanied by her husband and her son for follow-up of dizziness as well as palpitations.  She has been on Ozempic for several months now, she is down approximately 50 pounds and is doing well from that perspective.  She has been bothered by intermittent episodes of dizziness, typically occur when she changes position, sometimes occur when she turns her  head quickly.  Could be a component of slight dehydration along with vertigo.  Encouraged her to drink more water, she is currently drinking predominantly calorie free caffeinated beverages.  She has also really dealing with constipation and nausea from Ozempic, has been several days since her last bowel movement.  Has also been bothered by palpitations. She denies chest pain,  dyspnea, pnd, orthopnea, n, v, dizziness, syncope, edema, weight gain, or early satiety.    ROS: Review of Systems  HENT: Negative.    Eyes: Negative.   Respiratory:  Positive for shortness of breath.   Cardiovascular:  Positive for palpitations.  Gastrointestinal:  Positive for constipation.  Genitourinary: Negative.   Musculoskeletal:  Positive for joint pain.  Skin: Negative.   Neurological:  Positive for dizziness.  Endo/Heme/Allergies: Negative.   Psychiatric/Behavioral: Negative.      Studies Reviewed: .        Cardiac Studies & Procedures   ______________________________________________________________________________________________ CARDIAC CATHETERIZATION  CARDIAC CATHETERIZATION 09/18/2017  Conclusion 1. Severe single vessel CAD involving a 75% eccentric proximal LAD stenosis, treated successfully with a single DES (3.0x18 mm Xience [place]) 2. Patent Left Main, LCx, and RCA with minimal nonobstructive disease 3. Severely elevated LVEDP 4. Moderate LV systolic dysfunction with LVEF estimated at 40-45%  Recommend: DAPT with ASA and clopidogrel  x 12 months without interruption, med Rx for CHF, secondary risk reduction  Findings Coronary Findings Diagnostic  Dominance: Right  Left Anterior Descending Prox LAD lesion is 75% stenosed. The lesion is focal and eccentric.  Left Circumflex Vessel is angiographically normal.  Right Coronary Artery Vessel is angiographically normal. Widely  patent, dominant RCA  Intervention  Prox LAD lesion Stent Lesion crossed with guidewire using a WIRE COUGAR  XT STRL 190CM. Pre-stent angioplasty was not performed. A drug-eluting stent was successfully placed using a STENT [place] 3.00 X 18 MM. Post-stent angioplasty was performed using a BALLOON SAPPHIRE [place] 3.25X12. Maximum pressure:  16 atm. Post-Intervention Lesion Assessment The intervention was successful. Pre-interventional TIMI flow is 3. Post-intervention TIMI flow is 3. No complications occurred at this lesion. There is a 0% residual stenosis post intervention.     ECHOCARDIOGRAM  ECHOCARDIOGRAM COMPLETE 01/10/2023  Narrative ECHOCARDIOGRAM REPORT    Patient Name:   Katie Rasmussen Date of Exam: 01/10/2023 Medical Rec #:  [id number]                 Height:       60.0 in Accession #:    [id number]                Weight:       278.8 lb Date of Birth:  09/20/1970                 BSA:          2.150 m Patient Age:    52 years                  BP:           149/67 mmHg Patient Gender: F                         HR:           82 bpm. Exam Location:  Inpatient  Procedure: 2D Echo, Color Doppler and Cardiac Doppler  Indications:    atrial fibrillation  History:        Patient has prior history of Echocardiogram examinations, most recent 04/05/2022. CHF and Cardiomyopathy, CAD, COPD and chronic kidney disease; Risk Factors:Hypertension and Dyslipidemia.  Sonographer:    Dione Franks RDCS Referring Phys: [id number] [facility] GOEL   Sonographer Comments: Patient is obese. Image acquisition challenging due to patient body habitus. IMPRESSIONS   1. Left ventricular ejection fraction, by estimation, is 60 to 65%. The left ventricle has normal function. The left ventricle has no regional wall motion abnormalities. Left ventricular diastolic parameters are indeterminate. 2. Right ventricular systolic function is normal. The right ventricular size is normal. Tricuspid regurgitation signal is inadequate for assessing PA pressure. 3. The mitral valve is normal in structure. No evidence of  mitral valve regurgitation. 4. The aortic valve was not well visualized. Aortic valve regurgitation is not visualized. Mild aortic valve stenosis.  FINDINGS Left Ventricle: Left ventricular ejection fraction, by estimation, is 60 to 65%. The left ventricle has normal function. The left ventricle has no regional wall motion abnormalities. The left ventricular internal cavity size was normal in size. There is no left ventricular hypertrophy. Left ventricular diastolic parameters are indeterminate.  Right Ventricle: The right ventricular size is normal. No increase in right ventricular wall thickness. Right ventricular systolic function is normal. Tricuspid regurgitation signal is inadequate for assessing PA pressure.  Left [facility]: Left atrial size was normal in size.  Right [facility]: Right atrial size was normal in size.  Pericardium: Trivial pericardial effusion is present. Presence of epicardial fat layer.  Mitral Valve: The mitral valve is normal in structure. No evidence of mitral valve regurgitation.  Tricuspid Valve: The tricuspid valve is normal in structure. Tricuspid valve regurgitation is trivial.  Aortic Valve: The aortic valve was not well visualized. Aortic valve regurgitation is not visualized. Mild aortic stenosis is present. Aortic valve mean gradient measures 9.0 mmHg. Aortic valve peak gradient measures 16.0 mmHg. Aortic valve area, by VTI measures 1.33 cm.  Pulmonic Valve: The pulmonic valve was not well visualized. Pulmonic valve regurgitation is not visualized.  Aorta: The aortic root and ascending aorta are structurally normal, with no evidence of dilitation.  IAS/Shunts: The interatrial septum was not well visualized.   LEFT VENTRICLE PLAX 2D LVIDd:         5.10 cm   Diastology LVIDs:         3.40 cm   LV e' medial:    7.72 cm/s LV PW:         1.00 cm   LV E/e' medial:  14.5 LV IVS:        1.00 cm   LV e' lateral:   7.94 cm/s LVOT diam:     1.70 cm   LV E/e'  lateral: 14.1 LV SV:         53 LV SV Index:   25 LVOT Area:     2.27 cm   RIGHT VENTRICLE RV Basal diam:  2.40 cm RV S prime:     8.59 cm/s TAPSE (M-mode): 2.0 cm  LEFT ATRIUM             Index        RIGHT ATRIUM           Index LA diam:        4.20 cm 1.95 cm/m   RA Area:     14.00 cm LA Vol (A2C):   36.3 ml 16.89 ml/m  RA Volume:   32.10 ml  14.93 ml/m LA Vol (A4C):   65.6 ml 30.52 ml/m LA Biplane Vol: 49.9 ml 23.21 ml/m AORTIC VALVE AV Area (Vmax):    1.31 cm AV Area (Vmean):   1.30 cm AV Area (VTI):     1.33 cm AV Vmax:           200.00 cm/s AV Vmean:          137.500 cm/s AV VTI:            0.398 m AV Peak Grad:      16.0 mmHg AV Mean Grad:      9.0 mmHg LVOT Vmax:         115.00 cm/s LVOT Vmean:        78.650 cm/s LVOT VTI:          0.233 m LVOT/AV VTI ratio: 0.59  AORTA Ao Root diam: 2.60 cm Ao Asc diam:  2.70 cm  MITRAL VALVE MV Area (PHT): 4.39 cm     SHUNTS MV Decel Time: 173 msec     Systemic VTI:  0.23 m MV E velocity: 112.00 cm/s  Systemic Diam: 1.70 cm MV A velocity: 73.10 cm/s MV E/A ratio:  1.53  Carson Clara MD Electronically signed by Carson Clara MD Signature Date/Time: 01/10/2023/5:32:51 PM    Final          ______________________________________________________________________________________________      Risk Assessment/Calculations:          Physical Exam:   VS:  BP (!) 146/88   Pulse 76   Ht 5\' 1"  (1.549 m)   Wt 246 lb 3.2 oz (111.7 kg)   LMP 09/15/2017   SpO2 97%   BMI 46.52 kg/m    Wt Readings from  Last 3 Encounters:  11/14/23 246 lb 3.2 oz (111.7 kg)  09/26/23 256 lb (116.1 kg)  07/24/23 300 lb (136.1 kg)    GEN: no acute distress NECK: No JVD; No carotid bruits CARDIAC: RRR, no murmurs, rubs, gallops RESPIRATORY:  Clear to auscultation without rales, wheezing or rhonchi  ABDOMEN: Soft, non-tender, non-distended EXTREMITIES:  no edema  ASSESSMENT AND PLAN: .   CAD -s/p DES to LAD in  2019, Stable with no anginal symptoms. No indication for ischemic evaluation.  Continue Plavix  75 mg daily, continue aspirin  81 mg daily.  HFrEF with normalization of EF-most recent echo 60 to 65%, NYHA class II, euvolemic.  Beta-blocker previously stopped secondary to pulmonary issues.  Not a good candidate for SGLT2 inhibitor secondary to body habitus.  Continue Demadex  20 mg twice daily, continue metolazone  2.5 mg once a week.   SVT/AVNRT -will repeat a monitor for contributory causes.  She cannot tolerate beta-blocker secondary to pulmonary issues.  Continue diltiazem  ER 240 mg daily.  Obesity-BMI 46 which is down from 52 and she is making great progress with the assistance of Ozempic.   Constipation-encouraged her to take MiraLAX  until her she has a bowel movement, increase water.   Hyperlipidemia-most recent LDL on file for review was elevated at 104, triglycerides 335, not entirely clear who manages this.  Will need to repeat at next OV as her LDL is not well-managed.       Dispo:   Signed, Terrance Ferretti, NP

## 2023-12-06 ENCOUNTER — Other Ambulatory Visit: Payer: Self-pay | Admitting: Cardiology

## 2023-12-14 ENCOUNTER — Encounter: Payer: MEDICAID | Admitting: Podiatry

## 2023-12-14 NOTE — Progress Notes (Signed)
Patient did not show for scheduled appointment today.

## 2024-01-03 ENCOUNTER — Other Ambulatory Visit: Payer: Self-pay

## 2024-01-03 MED ORDER — DILTIAZEM HCL ER BEADS 240 MG PO CP24: 240.0000 mg | ORAL_CAPSULE | Freq: Every day | ORAL | 3 refills | Status: AC

## 2024-01-07 ENCOUNTER — Other Ambulatory Visit: Payer: Self-pay

## 2024-01-07 ENCOUNTER — Ambulatory Visit: Payer: Self-pay | Admitting: Cardiology

## 2024-01-07 DIAGNOSIS — I471 Supraventricular tachycardia, unspecified: Secondary | ICD-10-CM | POA: Diagnosis not present

## 2024-01-07 MED ORDER — APIXABAN 5 MG PO TABS: 5.0000 mg | ORAL_TABLET | Freq: Two times a day (BID) | ORAL | 3 refills | Status: AC

## 2024-01-07 NOTE — Progress Notes (Signed)
 LVM for pt to call and move her follow up sooner/kbl 01/07/24

## 2024-01-28 ENCOUNTER — Ambulatory Visit: Payer: MEDICAID | Attending: Cardiology | Admitting: Cardiology

## 2024-01-28 VITALS — BP 118/72 | HR 85 | Ht 61.0 in | Wt 228.4 lb

## 2024-01-28 DIAGNOSIS — E1169 Type 2 diabetes mellitus with other specified complication: Secondary | ICD-10-CM | POA: Diagnosis present

## 2024-01-28 DIAGNOSIS — I10 Essential (primary) hypertension: Secondary | ICD-10-CM | POA: Insufficient documentation

## 2024-01-28 DIAGNOSIS — M1A09X0 Idiopathic chronic gout, multiple sites, without tophus (tophi): Secondary | ICD-10-CM | POA: Insufficient documentation

## 2024-01-28 NOTE — Addendum Note (Signed)
 Addended by: ARLOA PLANAS D on: 01/28/2024 04:04 PM   Modules accepted: Orders

## 2024-01-28 NOTE — Progress Notes (Signed)
 Cardiology Office Note:    Date:  01/28/2024   ID:  Katie Rasmussen, DOB Dec 07, 1970, MRN [id number]  PCP:  Katie Wanda MATSU, FNP  Cardiologist:  Lamar Fitch, MD    Referring MD: Katie Rasmussen, *   Chief Complaint  Patient presents with   Results    History of Present Illness:    Katie Rasmussen is a 53 y.o. female past medical history significant for coronary artery disease, drug-eluting stent to proximal LAD in March 2009 in face of acute myocardial infarction, history of cardiomyopathy ejection fraction 30 to 35% however with appropriate guideline medical therapy therapy with normalization, supraventricular tachycardia morbid obesity, essential hypertension, asthma, obstructive sleep apnea, recently recognized paroxysmal atrial flutter/fibrillation.  She comes today to my office for follow-up she is doing fine she is terrified with new diagnosis.  She is actually crying in the office.  She denies have any palpitations overall majority of time doing well but complain of having multiple joints and pains.  She got multiple tophi in her skin and she is being seen by doctors from [place] and there is some talks about 3 weeks of new medication to reduce her uric acid that should help.  Complain of having weakness fatigue tiredness but overall cardiac wise doing well  Past Medical History:  Diagnosis Date   A-fib (HCC) 01/09/2023   Acute combined systolic and diastolic CHF, NYHA class 3 (HCC)    Acute on chronic respiratory failure with hypoxia and hypercapnia (HCC) 09/14/2017   Ankle sprain 02/18/2020   Asthma exacerbation 02/18/2020   Bronchitis due to tobacco use 08/2017   Chest pain 02/18/2020   Chronic respiratory failure (HCC)    Chronic venous insufficiency 08/30/2022   CKD (chronic kidney disease), stage III (HCC)    Class 3 severe obesity with serious comorbidity in adult 06/14/2022   COPD (chronic obstructive pulmonary disease) (HCC)     Coronary artery disease    DCM (dilated cardiomyopathy) (HCC)    Diastolic CHF (HCC)    Dyslipidemia 10/08/2017   Dyspnea on exertion 05/07/2018   Elevated troponin    Gout    Hypertension 02/18/2020   Hypothyroidism    h/o Grave's, s/p thyroidectomy & RAI in 1990s   Ischemic cardiomyopathy initial ejection fraction 35 to 40% in March 2019, normalization of ejection fraction based on echocardiogram from 2020 10/08/2017   Ejection fraction 35-40% in March 2019   Leukocytosis 02/18/2020   Obesity hypoventilation syndrome (HCC)    Orthostatic hypotension 02/18/2020   OSA (obstructive sleep apnea) 02/18/2020   Peripheral edema 02/18/2020   Pneumonia 08/2017   Postablative hypothyroidism 04/03/2019   Respiratory acidosis 02/18/2020   Sepsis (HCC) 02/18/2020   Supraventricular tachycardia (HCC) 07/08/2020   Systolic CHF with reduced left ventricular function, NYHA class 3 (HCC) 09/13/2017   Type 2 diabetes mellitus with other specified complication (HCC) 07/26/2023   Upper respiratory infection 02/18/2020   Vitamin D deficiency 07/26/2023   Weakness 02/18/2020    Past Surgical History:  Procedure Laterality Date   CESAREAN SECTION     CORONARY STENT INTERVENTION N/A 09/18/2017   Procedure: CORONARY STENT INTERVENTION;  Surgeon: Wonda Sharper, MD;  Location: [facility] INVASIVE CV LAB;  Service: Cardiovascular;  Laterality: N/A;  prox LAD   LEFT HEART CATH AND CORONARY ANGIOGRAPHY N/A 09/18/2017   Procedure: LEFT HEART CATH AND CORONARY ANGIOGRAPHY;  Surgeon: Wonda Sharper, MD;  Location[facility] INVASIVE CV LAB;  Service: Cardiovascular;  Laterality: N/A;   THYROID  SURGERY  s/p RAI for Grave's    Current Medications: Current Meds  Medication Sig   acetaminophen  (TYLENOL ) 500 MG tablet Take 1,000 mg by mouth every 6 (six) hours as needed for mild pain or moderate pain.   albuterol  (PROVENTIL ) (2.5 MG/3ML) 0.083% nebulizer solution Take 3 mLs (2.5 mg total) by nebulization every 6 (six)  hours as needed for wheezing or shortness of breath.   allopurinol  (ZYLOPRIM ) 100 MG tablet Take 1 tablet (100 mg total) by mouth daily.   ALPRAZolam  (XANAX ) 0.25 MG tablet Take 1 tablet (0.25 mg total) by mouth at bedtime as needed for anxiety.   apixaban  (ELIQUIS ) 5 MG TABS tablet Take 1 tablet (5 mg total) by mouth 2 (two) times daily.   colchicine  0.6 MG tablet Take 1 tablet (0.6 mg total) by mouth at bedtime.   cyanocobalamin  (VITAMIN B12) 1000 MCG/ML injection Inject 1 mL (1,000 mcg total) into the skin daily for 7 days, THEN 1 mL (1,000 mcg total) once a week for 28 days, THEN 1 mL (1,000 mcg total) every 30 (thirty) days.   diltiazem  (TIAZAC ) 240 MG 24 hr capsule Take 1 capsule (240 mg total) by mouth daily.   fluticasone  furoate-vilanterol (BREO ELLIPTA ) 200-25 MCG/INH AEPB Inhale 1 puff into the lungs daily.   levothyroxine  (SYNTHROID ) 200 MCG tablet Take 1 tablet (200 mcg total) by mouth every morning.   metolazone  (ZAROXOLYN ) 2.5 MG tablet Take 1 tablet (2.5 mg total) by mouth once a week.   metoprolol  tartrate (LOPRESSOR ) 25 MG tablet Take 25 mg by mouth daily.   ondansetron  (ZOFRAN ) 4 MG tablet Take 4 mg by mouth every 6 (six) hours as needed for nausea or vomiting.   OZEMPIC, 0.25 OR 0.5 MG/DOSE, 2 MG/3ML SOPN Inject 2 mg into the skin once a week.   potassium chloride  (KLOR-CON ) 10 MEQ tablet Take 1 tablet (10 mEq total) by mouth daily.   rosuvastatin (CRESTOR) 20 MG tablet Take 20 mg by mouth at bedtime.   SPIRIVA  RESPIMAT 1.25 MCG/ACT AERS Inhale 1 each into the lungs daily.   torsemide  (DEMADEX ) 20 MG tablet Take 1 tablet by mouth 2 times daily.   VENTOLIN  HFA 108 (90 Base) MCG/ACT inhaler INHALE 2 PUFFS BY MOUTH EVERY 4 HOURS ASNEEDED FOR SHORTNESS OF BREATH. (Patient taking differently: Inhale 2 puffs into the lungs every 4 (four) hours as needed for shortness of breath. INHALE 2 PUFFS BY MOUTH EVERY 4 HOURS ASNEEDED FOR SHORTNESS OF BREATH.)   Vitamin D, Ergocalciferol,  (DRISDOL) 1.25 MG (50000 UNIT) CAPS capsule Take 50,000 Units by mouth once a week.   [DISCONTINUED] ciclopirox  (PENLAC ) 8 % solution Apply topically at bedtime. Apply over nail. Apply daily over previous coat. Remove weekly with polish remover. (Patient taking differently: Apply 1 application  topically at bedtime. Apply over nail. Apply daily over previous coat. Remove weekly with polish remover.)   [DISCONTINUED] metoprolol  succinate (TOPROL -XL) 25 MG 24 hr tablet Take 25 mg by mouth daily.   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  Tobacco Use   Smoking status: Former    Current packs/day: 0.00    Average packs/day: 0.5 packs/day for 14.0 years (7.0 ttl pk-yrs)    Types: Cigarettes    Start date: 09/13/2003    Quit date: 09/12/2017    Years since quitting: 6.3   Smokeless tobacco: Never  Vaping Use   Vaping status: Never Used  Substance and Sexual Activity   Alcohol use: No   Drug use: No   Sexual activity: Not on file  Other Topics Concern   Not on file  Social History Narrative   Not on file   Social Drivers of Health   Financial Resource Strain: Not on file  Food Insecurity: No Food Insecurity (01/10/2023)   Hunger Vital Sign    Worried About Running Out of Food in the Last Year: Never true    Ran Out of Food in the Last Year: Never true  Transportation Needs: Not on file  Physical Activity: Not on file  Stress: Stress Concern Present (09/14/2017)   [employer] of  Occupational Health - Occupational Stress Questionnaire    Feeling of Stress : Very much  Social Connections: Not on file     Family History: The patient's family history includes Diabetes in her father; Heart disease in her father and mother; Hypertension in her mother. ROS:   Please see the history of present illness.    All 14 point review of systems negative except as described per history of present illness  EKGs/Labs/Other Studies Reviewed:    EKG Interpretation Date/Time:  Monday January 28 2024 15:20:59 EDT Ventricular Rate:  83 PR Interval:    QRS Duration:  74 QT Interval:  374 QTC Calculation: 439 R Axis:   -44  Text Interpretation: Atrial fibrillation Left axis deviation Low voltage QRS Inferior infarct , age undetermined Possible Anterolateral infarct (cited on or before 12-Feb-2023) Abnormal ECG When compared with ECG of 12-Feb-2023 10:51, Atrial fibrillation has replaced Sinus rhythm QRS axis Shifted left Inferior infarct is now Present Questionable change in initial forces of Anterolateral leads Confirmed by Bernie Charleston [phone]) on 01/28/2024 3:35:44 PM    Recent Labs: 06/12/2023: BUN 13; Creatinine, Ser 1.23; NT-Pro BNP 449; Potassium 3.8; Sodium 137 07/24/2023: Hemoglobin 12.7; Platelet Count 270  Recent Lipid Panel    Component Value Date/Time   CHOL 224 (H) 02/25/2021 1322   TRIG 335 (H) 02/25/2021 1322   HDL 64 02/25/2021 1322   CHOLHDL 3.5 02/25/2021 1322   LDLCALC 104 (H) 02/25/2021 1322    Physical Exam:    VS:  BP 118/72 (BP Location: Right Arm, Patient Position: Sitting)   Pulse 85   Ht 5' 1 (1.549 m)   Wt 228 lb 6.4 oz (103.6 kg)   LMP 09/15/2017   SpO2 95%   BMI 43.16 kg/m     Wt Readings from Last 3 Encounters:  01/28/24 228 lb 6.4 oz (103.6 kg)  11/14/23 246 lb 3.2 oz (111.7 kg)  09/26/23 256 lb (116.1 kg)     GEN:  Well nourished, well developed in no acute distress HEENT: Normal NECK: No JVD; No carotid bruits LYMPHATICS: No  lymphadenopathy CARDIAC: RRR, no murmurs, no rubs, no gallops RESPIRATORY:  Clear to auscultation without rales, wheezing or rhonchi  ABDOMEN: Soft, non-tender, non-distended MUSCULOSKELETAL:  No edema; No deformity  SKIN: Warm and dry LOWER EXTREMITIES: no swelling NEUROLOGIC:  Alert and oriented x 3 PSYCHIATRIC:  Normal affect  ASSESSMENT:    1. Primary hypertension    PLAN:    In order of problems listed above:  Paroxysmal atrial fibrillation, due to new discovery with atrial flutter, rate is controlled.  She is anticoagulated which I will continue.  She is telling me there is some issue with her thyroid  and clearly her thyroid  need to be corrected before we have an attempt to start thinking about more manageable rhythm controlled.  She is following up with her primary care physician this week so hopefully that can be corrected in the meantime continue anticoagulation continue rate control strategy. Coronary artery disease stable without any new issues. Dyslipidemia I did review K PN which show me data from 2022.  She will require repeated fasting lipid profile, AST ALT. Gout.  That being treated by group from [facility]   Medication Adjustments/Labs and Tests Ordered: Current medicines are reviewed at length with the patient today.  Concerns regarding medicines are outlined above.  Orders Placed This Encounter  Procedures   EKG 12-Lead   Medication changes: No orders of the defined types were placed in this encounter.   Signed, Lamar DOROTHA Fitch, MD, [facility] 01/28/2024 3:35 PM    [facility]

## 2024-01-28 NOTE — Patient Instructions (Signed)

## 2024-01-29 ENCOUNTER — Encounter: Payer: Self-pay | Admitting: Skilled Nursing Facility1

## 2024-01-29 ENCOUNTER — Encounter: Attending: Cardiology | Admitting: Skilled Nursing Facility1

## 2024-01-29 DIAGNOSIS — N183 Chronic kidney disease, stage 3 unspecified: Secondary | ICD-10-CM | POA: Insufficient documentation

## 2024-01-29 DIAGNOSIS — E66813 Obesity, class 3: Secondary | ICD-10-CM | POA: Diagnosis present

## 2024-01-29 DIAGNOSIS — E1169 Type 2 diabetes mellitus with other specified complication: Secondary | ICD-10-CM | POA: Insufficient documentation

## 2024-01-29 NOTE — Progress Notes (Signed)
 Medical Nutrition Therapy  Appointment Start time:  2:02  Appointment End time:  2:47  Primary concerns today: Eating with he chronic conditions   Referral diagnosis: 11.9   NUTRITION ASSESSMENT    Clinical Medical Hx: DM, Gout, Diverticulosis  Medications:  Labs: none seen in Lab values only in notes  Notable Signs/Symptoms: chronic Gout pain, stomach pain stating from Ozempic  Lifestyle & Dietary Hx  Pt states she lives with her grand babies and her daughter and her son Alease special needs lives with them as well as her husband.   Pt states she is on Ozempic sating it is hurting her stomach reducing her appetite and not allowing her to eat regularly.  Pt states she takes prednisone  for Gout stating she has not been able to walk in 2 years.  Pt states her husband does all of the cooking.  Pt states her A1C is 6.3. Pt states she is going to a specialty clinic for Gout in August and is hopeful the treatment will allow her to walk again.  Pt states she had a diverticulitis flair a few weeks ago but that has subsided.   Other Dx: Congestive heart failure COPD Asthma HTN CKD  Thyroid   Estimated daily fluid intake:  oz Supplements:  Sleep:  Stress / self-care:  Current average weekly physical activity: ADL's due to chronic pain with Gout  24-Hr Dietary Recall First Meal:  Snack:  Second Meal:  Snack:  Third Meal:  Snack:  Beverages: sugar free Gatorade     NUTRITION INTERVENTION  Nutrition education (E-1) on the following topics:  Creation of balanced and diverse meals to increase the intake of nutrient-rich foods that provide essential vitamins, minerals, fiber, and phytonutrients  Variety of Fruits and Vegetables:  Aim for a colorful array of fruits and vegetables to ensure a wide range of nutrients. Include a mix of leafy greens, berries, citrus fruits, cruciferous vegetables, and more. Whole Grains: Choose whole grains over refined grains. Examples  include brown rice, quinoa, oats, whole wheat, and barley. Lean Proteins: Include lean sources of protein, such as poultry, fish, tofu, legumes, beans, lentils, and low-fat dairy products. Limit red and processed meats. Healthy Fats: Incorporate sources of healthy fats, including avocados, nuts, seeds, and olive oil. Limit saturated and trans fats found in fried and processed foods. Dairy or Dairy Alternatives: Choose low-fat or fat-free dairy products, or plant-based alternatives like almond or soy milk. Portion Control: Be mindful of portion sizes to avoid overeating. Pay attention to hunger and satisfaction cues. Limit Added Sugars: Minimize the consumption of sugary beverages, snacks, and desserts. Check food labels for added sugars and opt for natural sources of sweetness such as whole fruits. Hydration: Drink plenty of water throughout the day. Limit sugary drinks and excessive caffeine intake. Moderate Sodium Intake: Reduce the consumption of high-sodium foods. Use herbs and spices for flavor instead of excessive salt. Meal Planning and Preparation: Plan and prepare meals ahead of time to make healthier choices more convenient. Include a mix of food groups in each meal. Limit Processed Foods: Minimize the intake of highly processed and packaged foods that are often high in added sugars, salt, and unhealthy fats. Regular Physical Activity: Combine a healthy diet with regular physical activity for overall well-being. Aim for at least 150 minutes of moderate-intensity aerobic exercise per week, along with strength training. Moderation and Balance: Enjoy treats and indulgent foods in moderation, emphasizing balance rather than strict restriction.  Handouts Provided Include  Detailed MyPlate  Learning Style & Readiness for Change Teaching method utilized: Visual & Auditory  Demonstrated degree of understanding via: Teach Back  Barriers to learning/adherence to lifestyle change:  lack of appetite and stomach pain/nausea   Goals Established by Pt I will eat 3 meals a day  I will not eat out more than once per week I will include cooked non starchy vegetables daily    MONITORING & EVALUATION Dietary intake, weekly physical activity  Next Steps  Patient is to return at the end of August.

## 2024-02-02 LAB — LAB REPORT - SCANNED
A1c: 5.9
EGFR: 50
TSH: 3.94

## 2024-02-15 ENCOUNTER — Ambulatory Visit: Payer: MEDICAID | Admitting: Cardiology

## 2024-02-21 ENCOUNTER — Telehealth: Payer: Self-pay | Admitting: Cardiology

## 2024-02-21 NOTE — Telephone Encounter (Signed)
 Per Damien, from Dr. Petrina office, the patient has severe gout and Dr. Dollene with like approval from Dr. Krasowski before prescribing Uloric or Krystexxa. Please advise.

## 2024-02-21 NOTE — Telephone Encounter (Signed)
 Pt c/o medication issue:  1. Name of Medication:  Uloric Krystexxa  2. How are you currently taking this medication (dosage and times per day)?   3. Are you having a reaction (difficulty breathing--STAT)?   4. What is your medication issue?   Per Damien, patient has severe gout and Dr. Dollene with like approval from Dr. Krasowski before prescribing Uloric or Krystexxa. Please advise.

## 2024-02-28 NOTE — Telephone Encounter (Signed)
 Attempted to call the patient. Patient did not answer the phone and the patient's voice mail was full so unable to leave a message.

## 2024-02-29 NOTE — Telephone Encounter (Signed)
 Attempted to call the patient. Patient did not answer the phone and the patient's voice mail was full so unable to leave a message.

## 2024-03-04 NOTE — Telephone Encounter (Signed)
 Called the patient and informed her of Dr. Karry recommendation below:  Out of those 2 medications Krystexxa seems to be more reasonable but still we have to be cautious  Patient verbalized understanding and had no further questions at this time.

## 2024-03-06 ENCOUNTER — Ambulatory Visit: Admitting: Skilled Nursing Facility1

## 2024-03-27 ENCOUNTER — Telehealth: Payer: Self-pay | Admitting: Cardiology

## 2024-03-27 NOTE — Telephone Encounter (Signed)
 Florence Perkins from [facility] and informed her of Dr. Karry recommendation regarding starting the patient on Krystexxa below:  Out of those 2 medications Krystexxa seems to be more reasonable but still we have to be cautious   Perkins verbalized understanding and had no further questions at this time.

## 2024-03-27 NOTE — Telephone Encounter (Signed)
 Dr. Julio Bravo's nurse calling for information on pt taking Krystexxa. Please advise.

## 2024-04-24 NOTE — Telephone Encounter (Addendum)
 Referral transcribed thru [facility] = [facility], [place] [facility]), jlb.  Unable to locate a clinic in [place]. [facility] that was found on Google, the number states it is not available, jlb.   ----- Message from Christell Frater, MD sent at 04/22/2024  9:18 AM EDT ----- Refer to neurology (not DR Larinda) re neuropathy in [place]

## 2024-04-27 NOTE — Progress Notes (Unsigned)
 Electrophysiology Office Note:   Date:  04/28/2024  ID:  Katie Rasmussen, DOB 04/22/1971, MRN [id number]  Primary Cardiologist: Lamar Fitch, MD Primary Heart Failure: None Electrophysiologist: Alexus Galka Gladis Norton, MD      History of Present Illness:   Katie Rasmussen is a 53 y.o. female with h/o hypothyroidism, pulmonary fibrosis, CKD stage III, coronary artery disease, chronic systolic heart failure with recovered ejection fraction, SVT seen today for routine electrophysiology followup.   Since last being seen in our clinic the patient reports increasing fatigue and shortness of breath.  She thinks that she is in atrial fibrillation most of the time.  She is also quite anxious about this diagnosis.  She has no chest pain, but does find it somewhat difficult to do her daily activities due to her level of fatigue she also does have palpitations that occur on a daily basis.  she denies chest pain, PND, orthopnea, nausea, vomiting, dizziness, syncope, edema, weight gain, or early satiety.   Review of systems complete and found to be negative unless listed in HPI.   EP Information / Studies Reviewed:    EKG is ordered today. Personal review as below.  EKG Interpretation Date/Time:  Monday April 28 2024 11:13:35 EDT Ventricular Rate:  112 PR Interval:    QRS Duration:  68 QT Interval:  334 QTC Calculation: 455 R Axis:   -40  Text Interpretation: Atrial fibrillation Left axis deviation Pulmonary disease pattern Septal infarct (cited on or before 12-Feb-2023) When compared with ECG of 28-Jan-2024 15:20, No significant change since last tracing Confirmed by Ledora Delker ([id number]) on 04/28/2024 11:15:42 AM     Risk Assessment/Calculations:    CHA2DS2-VASc Score = 4   This indicates a 4.8% annual risk of stroke. The patient's score is based upon: CHF History: 0 HTN History: 1 Diabetes History: 1 Stroke History: 0 Vascular Disease History: 1 Age Score:  0 Gender Score: 1           Physical Exam:   VS:  Ht 5' (1.524 m)   Wt 215 lb (97.5 kg)   LMP 09/15/2017   BMI 41.99 kg/m    Wt Readings from Last 3 Encounters:  04/28/24 215 lb (97.5 kg)  01/28/24 228 lb 6.4 oz (103.6 kg)  11/14/23 246 lb 3.2 oz (111.7 kg)     GEN: Well nourished, well developed in no acute distress NECK: No JVD; No carotid bruits CARDIAC: Irregularly irregular rate and rhythm, no murmurs, rubs, gallops RESPIRATORY:  Clear to auscultation without rales, wheezing or rhonchi  ABDOMEN: Soft, non-tender, non-distended EXTREMITIES:  No edema; No deformity   ASSESSMENT AND PLAN:    1.  SVT: Likely due to AVNRT.  Improved on diltiazem .  2.  Coronary artery disease: No current chest pain  3.  Chronic systolic heart failure: Ejection fraction improved.  Plan per primary cardiology.  4.  Obesity: Lifestyle modification encouraged  5.  Paroxysmal atrial fibrillation/flutter: On metoprolol  and diltiazem .  She feels poorly in atrial fibrillation with fatigue and shortness of breath.  We did discuss medication management versus ablation.  She would like to avoid long-term antiarrhythmics.  In addition, she does not want to be hospitalized for dofetilide, and with her thyroid  disease and lung disease, amiodarone would be less optimal.  Jaryan Chicoine plan for ablation.  Risk, benefits, and alternatives to EP study and radiofrequency/pulse field ablation for afib were also discussed in detail today. These risks include but are not limited to stroke, bleeding, vascular damage, tamponade,  perforation, damage to the esophagus, lungs, and other structures, pulmonary vein stenosis, worsening renal function, and death. The patient understands these risk and wishes to proceed.  We [place] therefore proceed with catheter ablation at the next available time.  Carto, ICE, anesthesia are requested for the procedure.  This patient Jovany Disano NOT require CT prior to ablation  6.  Secondary hypercoagulable  state: On Eliquis    Follow up with Afib Clinic as usual post procedure  Signed, Daivion Pape Gladis Norton, MD

## 2024-04-28 ENCOUNTER — Encounter: Payer: Self-pay | Admitting: Cardiology

## 2024-04-28 ENCOUNTER — Ambulatory Visit: Payer: MEDICAID | Attending: Cardiology | Admitting: Cardiology

## 2024-04-28 VITALS — BP 130/82 | HR 112 | Ht 60.0 in | Wt 215.0 lb

## 2024-04-28 DIAGNOSIS — D6869 Other thrombophilia: Secondary | ICD-10-CM

## 2024-04-28 DIAGNOSIS — I480 Paroxysmal atrial fibrillation: Secondary | ICD-10-CM

## 2024-04-28 DIAGNOSIS — Z01812 Encounter for preprocedural laboratory examination: Secondary | ICD-10-CM

## 2024-04-28 DIAGNOSIS — I10 Essential (primary) hypertension: Secondary | ICD-10-CM | POA: Diagnosis not present

## 2024-04-28 DIAGNOSIS — I471 Supraventricular tachycardia, unspecified: Secondary | ICD-10-CM

## 2024-04-28 DIAGNOSIS — I4892 Unspecified atrial flutter: Secondary | ICD-10-CM

## 2024-04-28 NOTE — Patient Instructions (Signed)
 Medication Instructions:  Your physician recommends that you continue on your current medications as directed. Please refer to the Current Medication list given to you today.  *If you need a refill on your cardiac medications before your next appointment, please call your pharmacy*   Lab Work: Pre procedure labs -- we will let you know when to get these done:  BMP & CBC  If you have a lab test that is abnormal and we need to change your treatment, we will call you to review the results -- otherwise no news is good news.    Testing/Procedures: Your physician has requested that you have cardiac CT 3 weeks PRIOR to your ablation. Cardiac computed tomography (CT) is a painless test that uses an x-ray machine to take clear, detailed pictures of your heart. We will contact you if the result is abnormal. We will call you to schedule.  Your physician has recommended that you have an ablation. Catheter ablation is a medical procedure used to treat some cardiac arrhythmias (irregular heartbeats). During catheter ablation, a long, thin, flexible tube is put into a blood vessel in your groin (upper thigh), or neck. This tube is called an ablation catheter. It is then guided to your heart through the blood vessel. Radio frequency waves destroy small areas of heart tissue where abnormal heartbeats may cause an arrhythmia to start. Please review the information below on ablation and after care.  Your ablation is scheduled for 07/17/2024. [facility] arrive at [facility] at 9:30 am.  We will call/send instructions at a later date.   Follow-Up: At [facility], you and your health needs are our priority.  As part of our continuing mission to provide you with exceptional heart care, we have created designated Provider Care Teams.  These Care Teams include your primary Cardiologist (physician) and Advanced Practice Providers (APPs -  Physician Assistants and Nurse Practitioners) who all work together to  provide you with the care you need, when you need it.  Your next appointment:   1 month(s) after your ablation  The format for your next appointment:   In Person  Provider:   AFib clinic   Thank you for choosing [facility]!!   Maeola Domino, RN [phone]    Other Instructions   Cardiac Ablation Cardiac ablation is a procedure to destroy (ablate) some heart tissue that is sending bad signals. These bad signals cause problems in heart rhythm. The heart has many areas that make these signals. If there are problems in these areas, they can make the heart beat in a way that is not normal. Destroying some tissues can help make the heart rhythm normal. Tell your doctor about: Any allergies you have. All medicines you are taking. These include vitamins, herbs, eye drops, creams, and over-the-counter medicines. Any problems you or family members have had with medicines that make you fall asleep (anesthetics). Any blood disorders you have. Any surgeries you have had. Any medical conditions you have, such as kidney failure. Whether you are pregnant or may be pregnant. What are the risks? This is a safe procedure. But problems may occur, including: Infection. Bruising and bleeding. Bleeding into the chest. Stroke or blood clots. Damage to nearby areas of your body. Allergies to medicines or dyes. The need for a pacemaker if the normal system is damaged. Failure of the procedure to treat the problem. What happens before the procedure? Medicines Ask your doctor about: Changing or stopping your normal medicines. This is important. Taking  aspirin  and ibuprofen. Do not take these medicines unless your doctor tells you to take them. Taking other medicines, vitamins, herbs, and supplements. General instructions Follow instructions from your doctor about what you cannot eat or drink. Plan to have someone take you home from the hospital or clinic. If you will be going home  right after the procedure, plan to have someone with you for 24 hours. Ask your doctor what steps will be taken to prevent infection. What happens during the procedure?  An IV tube will be put into one of your veins. You will be given a medicine to help you relax. The skin on your neck or groin will be numbed. A cut (incision) will be made in your neck or groin. A needle will be put through your cut and into a large vein. A tube (catheter) will be put into the needle. The tube will be moved to your heart. Dye may be put through the tube. This helps your doctor see your heart. Small devices (electrodes) on the tube will send out signals. A type of energy will be used to destroy some heart tissue. The tube will be taken out. Pressure will be held on your cut. This helps stop bleeding. A bandage will be put over your cut. The exact procedure may vary among doctors and hospitals. What happens after the procedure? You will be watched until you leave the hospital or clinic. This includes checking your heart rate, breathing rate, oxygen, and blood pressure. Your cut will be watched for bleeding. You will need to lie still for a few hours. Do not drive for 24 hours or as long as your doctor tells you. Summary Cardiac ablation is a procedure to destroy some heart tissue. This is done to treat heart rhythm problems. Tell your doctor about any medical conditions you may have. Tell him or her about all medicines you are taking to treat them. This is a safe procedure. But problems may occur. These include infection, bruising, bleeding, and damage to nearby areas of your body. Follow what your doctor tells you about food and drink. You may also be told to change or stop some of your medicines. After the procedure, do not drive for 24 hours or as long as your doctor tells you. This information is not intended to replace advice given to you by your health care provider. Make sure you discuss any questions  you have with your health care provider. Document Revised: 09/16/2021 Document Reviewed: 05/29/2019 Elsevier Patient Education  2023 Elsevier Inc.   Cardiac Ablation, Care After  This sheet gives you information about how to care for yourself after your procedure. Your health care provider may also give you more specific instructions. If you have problems or questions, contact your health care provider. What can I expect after the procedure? After the procedure, it is common to have: Bruising around your puncture site. Tenderness around your puncture site. Skipped heartbeats. If you had an atrial fibrillation ablation, you may have atrial fibrillation during the first several months after your procedure.  Tiredness (fatigue).  Follow these instructions at home: Puncture site care  Follow instructions from your health care provider about how to take care of your puncture site. Make sure you: If present, leave stitches (sutures), skin glue, or adhesive strips in place. These skin closures may need to stay in place for up to 2 weeks. If adhesive strip edges start to loosen and curl up, you may trim the loose edges. Do not  remove adhesive strips completely unless your health care provider tells you to do that. If a large square bandage is present, this may be removed 24 hours after surgery.  Check your puncture site every day for signs of infection. Check for: Redness, swelling, or pain. Fluid or blood. If your puncture site starts to bleed, lie down on your back, apply firm pressure to the area, and contact your health care provider. Warmth. Pus or a bad smell. A pea or marble sized lump/knot at the site is normal and can take up to three months to resolve.  Driving Do not drive for at least 4 days after your procedure or however long your health care provider recommends. (Do not resume driving if you have previously been instructed not to drive for other health reasons.) Do not drive or  use heavy machinery while taking prescription pain medicine. Activity Avoid activities that take a lot of effort for at least 7 days after your procedure. Do not lift anything that is heavier than 5 lb (4.5 kg) for one week.  No sexual activity for 1 week.  Return to your normal activities as told by your health care provider. Ask your health care provider what activities are safe for you. General instructions Take over-the-counter and prescription medicines only as told by your health care provider. Do not use any products that contain nicotine  or tobacco, such as cigarettes and e-cigarettes. If you need help quitting, ask your health care provider. You may shower after 24 hours, but Do not take baths, swim, or use a hot tub for 1 week.  Do not drink alcohol for 24 hours after your procedure. Keep all follow-up visits as told by your health care provider. This is important. Contact a health care provider if: You have redness, mild swelling, or pain around your puncture site. You have fluid or blood coming from your puncture site that stops after applying firm pressure to the area. Your puncture site feels warm to the touch. You have pus or a bad smell coming from your puncture site. You have a fever. You have chest pain or discomfort that spreads to your neck, jaw, or arm. You have chest pain that is worse with lying on your back or taking a deep breath. You are sweating a lot. You feel nauseous. You have a fast or irregular heartbeat. You have shortness of breath. You are dizzy or light-headed and feel the need to lie down. You have pain or numbness in the arm or leg closest to your puncture site. Get help right away if: Your puncture site suddenly swells. Your puncture site is bleeding and the bleeding does not stop after applying firm pressure to the area. These symptoms may represent a serious problem that is an emergency. Do not wait to see if the symptoms will go away. Get medical  help right away. Call your local emergency services (911 in the [place] Do not drive yourself to the hospital. Summary After the procedure, it is normal to have bruising and tenderness at the puncture site in your groin, neck, or forearm. Check your puncture site every day for signs of infection. Get help right away if your puncture site is bleeding and the bleeding does not stop after applying firm pressure to the area. This is a medical emergency. This information is not intended to replace advice given to you by your health care provider. Make sure you discuss any questions you have with your health care provider.

## 2024-04-29 ENCOUNTER — Ambulatory Visit: Attending: Cardiology | Admitting: Cardiology

## 2024-04-29 ENCOUNTER — Encounter: Payer: Self-pay | Admitting: Cardiology

## 2024-04-29 VITALS — BP 130/78 | HR 80 | Ht 60.0 in | Wt 213.0 lb

## 2024-04-29 DIAGNOSIS — E785 Hyperlipidemia, unspecified: Secondary | ICD-10-CM

## 2024-04-29 DIAGNOSIS — I471 Supraventricular tachycardia, unspecified: Secondary | ICD-10-CM | POA: Diagnosis not present

## 2024-04-29 DIAGNOSIS — R0609 Other forms of dyspnea: Secondary | ICD-10-CM

## 2024-04-29 DIAGNOSIS — I420 Dilated cardiomyopathy: Secondary | ICD-10-CM

## 2024-04-29 DIAGNOSIS — I480 Paroxysmal atrial fibrillation: Secondary | ICD-10-CM

## 2024-04-29 DIAGNOSIS — I2510 Atherosclerotic heart disease of native coronary artery without angina pectoris: Secondary | ICD-10-CM

## 2024-04-29 NOTE — Patient Instructions (Addendum)
 Medication Instructions:      Lab Work: None Ordered If you have labs (blood work) drawn today and your tests are completely normal, you will receive your results only by: MyChart Message (if you have MyChart) OR A paper copy in the mail If you have any lab test that is abnormal or we need to change your treatment, we will call you to review the results.   Testing/Procedures: Your physician has requested that you have an echocardiogram. Echocardiography is a painless test that uses sound waves to create images of your heart. It provides your doctor with information about the size and shape of your heart and how well your heart's chambers and valves are working. This procedure takes approximately one hour. There are no restrictions for this procedure. Please do NOT wear cologne, perfume, aftershave, or lotions (deodorant is allowed). Please arrive 15 minutes prior to your appointment time.  Please note: We ask at that you not bring children with you during ultrasound (echo/ vascular) testing. Due to room size and safety concerns, children are not allowed in the ultrasound rooms during exams. Our front office staff cannot provide observation of children in our lobby area while testing is being conducted. An adult accompanying a patient to their appointment will only be allowed in the ultrasound room at the discretion of the ultrasound technician under special circumstances. We apologize for any inconvenience.    Follow-Up: At [facility], you and your health needs are our priority.  As part of our continuing mission to provide you with exceptional heart care, we have created designated Provider Care Teams.  These Care Teams include your primary Cardiologist (physician) and Advanced Practice Providers (APPs -  Physician Assistants and Nurse Practitioners) who all work together to provide you with the care you need, when you need it.  We recommend signing up for the patient portal called  MyChart.  Sign up information is provided on this After Visit Summary.  MyChart is used to connect with patients for Virtual Visits (Telemedicine).  Patients are able to view lab/test results, encounter notes, upcoming appointments, etc.  Non-urgent messages can be sent to your provider as well.   To learn more about what you can do with MyChart, go to [email or web address].    Your next appointment:   3 month(s)  The format for your next appointment:   In Person  Provider:   Lamar Fitch, MD    Other Instructions Pharm D appt for injectable cholesterol medication

## 2024-04-29 NOTE — Addendum Note (Signed)
 Addended by: ARLOA PLANAS D on: 04/29/2024 04:12 PM   Modules accepted: Orders

## 2024-04-29 NOTE — Progress Notes (Signed)
 Cardiology Office Note:    Date:  04/29/2024   ID:  Katie Rasmussen, DOB [birth year], MRN [id number]  PCP:  Trudy Wanda MATSU, FNP  Cardiologist:  Lamar Fitch, MD    Referring MD: Trudy Wanda MATSU, *   Chief Complaint  Patient presents with   Follow-up  Doing fine  History of Present Illness:    Katie Rasmussen is a 53 y.o. female past medical history significant for coronary artery disease she did have drug-eluting stent proximal LAD in 2009 face of acute myocardial infarction, after that ejection fraction 30 to 35% but with appropriate guideline directed medical therapy her ejection fraction normalized, also supraventricular tachycardia, morbid obesity, essential hypertension, obstructive sleep apnea, recently recognized paroxysmal atrial fibrillation/father.  Comes today to months for follow-up overall she looks well and feels well.  Denies having any chest pain tightness squeezing pressure burning chest still some palpitations better.  She did see our EP colleagues yesterday, plan is to do atrial fibrillation ablation.  Past Medical History:  Diagnosis Date   A-fib (HCC) 01/09/2023   Acute combined systolic and diastolic CHF, NYHA class 3 (HCC)    Acute on chronic respiratory failure with hypoxia and hypercapnia (HCC) 09/14/2017   Ankle sprain 02/18/2020   Asthma exacerbation 02/18/2020   Bronchitis due to tobacco use 08/2017   Chest pain 02/18/2020   Chronic respiratory failure (HCC)    Chronic venous insufficiency 08/30/2022   CKD (chronic kidney disease), stage III (HCC)    Class 3 severe obesity with serious comorbidity in adult [facility]) 06/14/2022   COPD (chronic obstructive pulmonary disease) (HCC)    Coronary artery disease    DCM (dilated cardiomyopathy) (HCC)    Diastolic CHF (HCC)    Dyslipidemia 10/08/2017   Dyspnea on exertion 05/07/2018   Elevated troponin    Gout    Hypertension 02/18/2020   Hypothyroidism    h/o Grave's, s/p  thyroidectomy & RAI in 1990s   Ischemic cardiomyopathy initial ejection fraction 35 to 40% in March 2019, normalization of ejection fraction based on echocardiogram from 2020 10/08/2017   Ejection fraction 35-40% in March 2019   Leukocytosis 02/18/2020   Obesity hypoventilation syndrome (HCC)    Orthostatic hypotension 02/18/2020   OSA (obstructive sleep apnea) 02/18/2020   Peripheral edema 02/18/2020   Pneumonia 08/2017   Postablative hypothyroidism 04/03/2019   Respiratory acidosis 02/18/2020   Sepsis (HCC) 02/18/2020   Supraventricular tachycardia 07/08/2020   Systolic CHF with reduced left ventricular function, NYHA class 3 (HCC) 09/13/2017   Type 2 diabetes mellitus with other specified complication (HCC) 07/26/2023   Upper respiratory infection 02/18/2020   Vitamin D deficiency 07/26/2023   Weakness 02/18/2020    Past Surgical History:  Procedure Laterality Date   CESAREAN SECTION     CORONARY STENT INTERVENTION N/A 09/18/2017   Procedure: CORONARY STENT INTERVENTION;  Surgeon: Wonda Sharper, MD;  Location: [facility] INVASIVE CV LAB;  Service: Cardiovascular;  Laterality: N/A;  prox LAD   LEFT HEART CATH AND CORONARY ANGIOGRAPHY N/A 09/18/2017   Procedure: LEFT HEART CATH AND CORONARY ANGIOGRAPHY;  Surgeon: Wonda Sharper, MD;  Location: [facility] INVASIVE CV LAB;  Service: Cardiovascular;  Laterality: N/A;   THYROID  SURGERY     s/p RAI for Grave's    Current Medications: Current Meds  Medication Sig   acetaminophen  (TYLENOL ) 500 MG tablet Take 1,000 mg by mouth every 6 (six) hours as needed for mild pain or moderate pain.   albuterol  (PROVENTIL ) (2.5 MG/3ML) 0.083% nebulizer solution Take  3 mLs (2.5 mg total) by nebulization every 6 (six) hours as needed for wheezing or shortness of breath.   ALPRAZolam  (XANAX ) 0.25 MG tablet Take 1 tablet (0.25 mg total) by mouth at bedtime as needed for anxiety.   apixaban  (ELIQUIS ) 5 MG TABS tablet Take 1 tablet (5 mg total) by mouth 2 (two) times  daily.   colchicine  0.6 MG tablet Take 1 tablet (0.6 mg total) by mouth at bedtime.   cyanocobalamin  (VITAMIN B12) 1000 MCG/ML injection Inject 1 mL (1,000 mcg total) into the skin daily for 7 days, THEN 1 mL (1,000 mcg total) once a week for 28 days, THEN 1 mL (1,000 mcg total) every 30 (thirty) days.   diltiazem  (TIAZAC ) 240 MG 24 hr capsule Take 1 capsule (240 mg total) by mouth daily.   levothyroxine  (SYNTHROID ) 200 MCG tablet Take 1 tablet (200 mcg total) by mouth every morning.   metolazone  (ZAROXOLYN ) 2.5 MG tablet Take 1 tablet (2.5 mg total) by mouth once a week. (Patient taking differently: Take 2.5 mg by mouth once a week. Taking as needed)   metoprolol  tartrate (LOPRESSOR ) 25 MG tablet Take 25 mg by mouth daily. (Patient taking differently: Take 25 mg by mouth daily. Taking as needed)   ondansetron  (ZOFRAN ) 4 MG tablet Take 4 mg by mouth every 6 (six) hours as needed for nausea or vomiting.   OZEMPIC, 0.25 OR 0.5 MG/DOSE, 2 MG/3ML SOPN Inject 2 mg into the skin once a week.   potassium chloride  (KLOR-CON ) 10 MEQ tablet Take 1 tablet (10 mEq total) by mouth daily.   SPIRIVA  RESPIMAT 1.25 MCG/ACT AERS Inhale 1 each into the lungs daily.   torsemide  (DEMADEX ) 20 MG tablet Take 1 tablet by mouth 2 times daily.   VENTOLIN  HFA 108 (90 Base) MCG/ACT inhaler INHALE 2 PUFFS BY MOUTH EVERY 4 HOURS ASNEEDED FOR SHORTNESS OF BREATH. (Patient taking differently: Inhale 2 puffs into the lungs every 4 (four) hours as needed for shortness of breath. INHALE 2 PUFFS BY MOUTH EVERY 4 HOURS ASNEEDED FOR SHORTNESS OF BREATH.)   Vitamin D, Ergocalciferol, (DRISDOL) 1.25 MG (50000 UNIT) CAPS capsule Take 50,000 Units by mouth once a week.   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  Tobacco Use   Smoking status: Former    Current packs/day: 0.00    Average packs/day: 0.5 packs/day for 14.0 years (7.0 ttl pk-yrs)    Types: Cigarettes    Start date: 09/13/2003    Quit date: 09/12/2017    Years since quitting: 6.6    Passive exposure: Past   Smokeless tobacco: Never  Vaping Use   Vaping status: Never Used  Substance and Sexual Activity   Alcohol use: No   Drug use: No   Sexual activity: Not on file  Other Topics Concern   Not on file  Social History Narrative   Not on file   Social Drivers of Health   Financial Resource Strain: Not on file  Food Insecurity: Low Risk  (02/18/2024)   Received from [facility]   Hunger Vital Sign    Within the past 12 months, you worried that your food would run out before you got money to buy more: Never true    Within the past 12 months, the food you bought just didn't last and you didn't have money to get more. : Never true  Transportation Needs: No Transportation Needs (02/18/2024)   Received from [facility]    In the past 12 months, has lack of reliable transportation kept you from medical appointments, meetings, work or from getting things needed for daily living? : No  Physical Activity: Not on file  Stress: Stress Concern Present (09/14/2017)   [employer] of Occupational Health - Occupational Stress Questionnaire    Feeling of Stress : Very much  Social Connections: Not on file     Family History: The patient's family history includes Diabetes in her father; Heart disease in her father and mother; Hypertension in her mother. ROS:   Please see the history of present illness.    All 14 point review of  systems negative except as described per history of present illness  EKGs/Labs/Other Studies Reviewed:         Recent Labs: 06/12/2023: BUN 13; Creatinine, Ser 1.23; NT-Pro BNP 449; Potassium 3.8; Sodium 137 07/24/2023: Hemoglobin 12.7; Platelet Count 270 02/02/2024: TSH 3.940  Recent Lipid Panel    Component Value Date/Time   CHOL 224 (H) 02/25/2021 1322   TRIG 335 (H) 02/25/2021 1322   HDL 64 02/25/2021 1322   CHOLHDL 3.5 02/25/2021 1322   LDLCALC 104 (H) 02/25/2021 1322    Physical Exam:    VS:  BP 130/78   Pulse 80   Ht 5' (1.524 m)   Wt 213 lb (96.6 kg)   LMP 09/15/2017   SpO2 98%   BMI 41.60 kg/m     Wt Readings from Last 3 Encounters:  04/29/24 213 lb (96.6 kg)  04/28/24 215 lb (97.5 kg)  01/28/24 228 lb 6.4 oz (103.6 kg)     GEN:  Well nourished, well developed in no acute distress HEENT: Normal NECK: No JVD; No carotid bruits LYMPHATICS: No lymphadenopathy CARDIAC: RRR, no murmurs, no rubs, no gallops RESPIRATORY:  Clear to auscultation without rales, wheezing or rhonchi  ABDOMEN: Soft, non-tender, non-distended MUSCULOSKELETAL:  No edema; No deformity  SKIN: Warm and dry LOWER EXTREMITIES: no swelling NEUROLOGIC:  Alert and oriented x 3 PSYCHIATRIC:  Normal affect   ASSESSMENT:    1. Coronary artery disease involving native coronary artery of native heart without angina pectoris   2. Paroxysmal atrial fibrillation (HCC)   3. Supraventricular tachycardia   4. DCM (dilated cardiomyopathy) (HCC)    PLAN:    In order of problems listed above:  Coronary disease stable as well as rectal medical therapy which I will continue. Dyslipidemia: I did review blood test done by primary care physician her LDL 132 HDL 32.  She takes Crestor but she thinks it gave her a lot of muscle and joint aches.  Will discontinue we will put her on PCSK9 agent.  Fasting lipid profile is TLT 6 weeks. History of cardiomyopathy will repeat her echocardiogram recheck left  ventricle ejection fraction.  She wants to start some medication for gout but that apparently can cause some problem with her heart.  Will first check ejection fraction and then determine about the usefulness of this medication   Medication Adjustments/Labs  and Tests Ordered: Current medicines are reviewed at length with the patient today.  Concerns regarding medicines are outlined above.  No orders of the defined types were placed in this encounter.  Medication changes: No orders of the defined types were placed in this encounter.   Signed, Lamar DOROTHA Fitch, MD, [facility] 04/29/2024 3:57 PM    [facility]

## 2024-04-30 LAB — LAB REPORT - SCANNED
A1c: 5.6
EGFR: 48
TSH: 0.3 — AB (ref 0.41–5.90)

## 2024-05-21 ENCOUNTER — Ambulatory Visit: Attending: Cardiology

## 2024-05-21 DIAGNOSIS — R0609 Other forms of dyspnea: Secondary | ICD-10-CM

## 2024-05-21 LAB — ECHOCARDIOGRAM COMPLETE
AR max vel: 1.44 cm2
AV Area VTI: 1.43 cm2
AV Area mean vel: 1.39 cm2
AV Mean grad: 6 mmHg
AV Peak grad: 10.8 mmHg
Ao pk vel: 1.65 m/s
Area-P 1/2: 4.01 cm2
MV VTI: 1.25 cm2
S' Lateral: 3.3 cm

## 2024-05-29 ENCOUNTER — Ambulatory Visit: Payer: Self-pay | Admitting: Cardiology

## 2024-06-03 ENCOUNTER — Telehealth: Payer: Self-pay | Admitting: Pharmacist Clinician (PhC)/ Clinical Pharmacy Specialist

## 2024-06-03 ENCOUNTER — Ambulatory Visit: Attending: Cardiology | Admitting: Pharmacist Clinician (PhC)/ Clinical Pharmacy Specialist

## 2024-06-03 ENCOUNTER — Encounter: Payer: Self-pay | Admitting: Pharmacist Clinician (PhC)/ Clinical Pharmacy Specialist

## 2024-06-03 DIAGNOSIS — E785 Hyperlipidemia, unspecified: Secondary | ICD-10-CM

## 2024-06-03 NOTE — Progress Notes (Signed)
 Office Visit    Patient Name: Katie Rasmussen Date of Encounter: 06/03/2024  Primary Care Provider:  Trudy Wanda MATSU, FNP Primary Cardiologist:  Lamar Fitch, MD  Chief Complaint    Hyperlipidemia   Significant Past Medical History   ASCVD 2009  MI, w/DES to LAD  HTN Has been elevated recently 2/2 pain from chronic gout.   DM2 2 mg Ozempic  AF Ablation planned with EP  CKD Stage 3b GFR 44  OSA Using CPAP nightly     Allergies  Allergen Reactions   Enoxaparin  Other (See Comments)    Other Reaction(s): Other (See Comments)    Patient stated it was added to her chart in 2019 after her MI. She stated she was not alert enough to remember why it was added   Enoxaparin  Sodium Other (See Comments)    Patient stated it was added to her chart in 2019 after her MI. She stated she was not alert enough to remember why it was added   Iodine Hives and Itching   Sulfa  Antibiotics Other (See Comments)   Shellfish Allergy Hives and Itching    History of Present Illness    Katie Rasmussen is a 53 y.o. female patient of Dr Fitch, in the office today to discuss options for cholesterol management.  She is currently dealing with multiple areas of uric acid crystal deposits and chronic gout.  Is very concerned that cholesterol medications might worsen the pain, as she had severe myalgias with statins.    Insurance Carrier:  Managed Medicaid - [employer]  Pharmacy:   [facility]  LDL Cholesterol goal:  LDL < 55  Current Medications:  none   Previously tried:  rosuvastatin, atorvastatin  - myalgias  (shoulders and knees mostly)  Family Hx:   father famly with MI, heart disease, none lived to 59, fahter died at 38 from MI; mother family has more lung disease; 4 kids, 3 with Moya Moya (1 deceased)  Social Hx: Tobacco: no Alcohol: no     Diet:    more home cooked meals, mostly chicken for protein; vegetables frozen or canned (washed); no snack foods  (has  lost 90 pounds in past 18 months)  Exercise: held up by gout concerns.  Has appt with neuro tomorrow  Accessory Clinical Findings   02/02/24  TC 217, TG 180, HDL 53, LDL 132  Lab Results  Component Value Date   CHOL 224 (H) 02/25/2021   HDL 64 02/25/2021   LDLCALC 104 (H) 02/25/2021   TRIG 335 (H) 02/25/2021   CHOLHDL 3.5 02/25/2021    No results found for: LIPOA  Lab Results  Component Value Date   ALT 31 01/18/2023   AST 37 01/18/2023   ALKPHOS 104 01/18/2023   BILITOT 0.5 01/18/2023   Lab Results  Component Value Date   CREATININE 1.23 (H) 06/12/2023   BUN 13 06/12/2023   NA 137 06/12/2023   K 3.8 06/12/2023   CL 94 (L) 06/12/2023   CO2 21 06/12/2023   Lab Results  Component Value Date   HGBA1C 5.8 (H) 10/10/2019    Home Medications    Current Outpatient Medications  Medication Sig Dispense Refill   acetaminophen  (TYLENOL ) 500 MG tablet Take 1,000 mg by mouth every 6 (six) hours as needed for mild pain or moderate pain.     albuterol  (PROVENTIL ) (2.5 MG/3ML) 0.083% nebulizer solution Take 3 mLs (2.5 mg total) by nebulization every 6 (six) hours as needed for wheezing or shortness  of breath. 75 mL 12   ALPRAZolam  (XANAX ) 0.25 MG tablet Take 1 tablet (0.25 mg total) by mouth at bedtime as needed for anxiety. 10 tablet 0   apixaban  (ELIQUIS ) 5 MG TABS tablet Take 1 tablet (5 mg total) by mouth 2 (two) times daily. 180 tablet 3   colchicine  0.6 MG tablet Take 1 tablet (0.6 mg total) by mouth at bedtime.     cyanocobalamin  (VITAMIN B12) 1000 MCG/ML injection Inject 1 mL (1,000 mcg total) into the skin daily for 7 days, THEN 1 mL (1,000 mcg total) once a week for 28 days, THEN 1 mL (1,000 mcg total) every 30 (thirty) days. 1 mL 0   diltiazem  (TIAZAC ) 240 MG 24 hr capsule Take 1 capsule (240 mg total) by mouth daily. 90 capsule 3   levothyroxine  (SYNTHROID ) 200 MCG tablet Take 1 tablet (200 mcg total) by mouth every morning. 30 tablet 0   metolazone  (ZAROXOLYN ) 2.5 MG  tablet Take 1 tablet (2.5 mg total) by mouth once a week. (Patient taking differently: Take 2.5 mg by mouth once a week. Taking as needed)     metoprolol  tartrate (LOPRESSOR ) 25 MG tablet Take 25 mg by mouth daily. (Patient taking differently: Take 25 mg by mouth daily. Taking as needed)     ondansetron  (ZOFRAN ) 4 MG tablet Take 4 mg by mouth every 6 (six) hours as needed for nausea or vomiting.     OZEMPIC, 0.25 OR 0.5 MG/DOSE, 2 MG/3ML SOPN Inject 2 mg into the skin once a week.     potassium chloride  (KLOR-CON ) 10 MEQ tablet Take 1 tablet (10 mEq total) by mouth daily. 30 tablet 2   SPIRIVA  RESPIMAT 1.25 MCG/ACT AERS Inhale 1 each into the lungs daily.     torsemide  (DEMADEX ) 20 MG tablet Take 1 tablet by mouth 2 times daily. 180 tablet 3   VENTOLIN  HFA 108 (90 Base) MCG/ACT inhaler INHALE 2 PUFFS BY MOUTH EVERY 4 HOURS ASNEEDED FOR SHORTNESS OF BREATH. (Patient taking differently: Inhale 2 puffs into the lungs every 4 (four) hours as needed for shortness of breath. INHALE 2 PUFFS BY MOUTH EVERY 4 HOURS ASNEEDED FOR SHORTNESS OF BREATH.) 18 g 0   Vitamin D, Ergocalciferol, (DRISDOL) 1.25 MG (50000 UNIT) CAPS capsule Take 50,000 Units by mouth once a week.     Current Facility-Administered Medications  Medication Dose Route Frequency Provider Last Rate Last Admin   triamcinolone  acetonide (KENALOG ) 10 MG/ML injection 10 mg  10 mg Other Once Stover, Titorya, DPM         Assessment & Plan    Dyslipidemia Assessment: Patient with ASCVD not at LDL goal of < 55 Most recent LDL 132 on 02/02/2024 Not able to tolerate statins secondary to myalgias - rosuvastatin, atorvastati Reviewed options for lowering LDL cholesterol, including PCSK-9 inhibitors and inclisiran.  Discussed mechanisms of action, dosing, side effects, potential decreases in LDL cholesterol and costs.  Also reviewed potential options for patient assistance.  Plan: Patient agreeable to starting Repatha Repeat labs after:  3  months Lipid Liver function    Allean Mink, PharmD CPP [facility] [address] [place], [place] [phone]  06/03/2024, 4:15 PM

## 2024-06-03 NOTE — Patient Instructions (Signed)
 Your Results:             Your most recent labs Goal  Total Cholesterol 217 < 200  Triglycerides 180 < 150  HDL (happy/good cholesterol) 53 > 40  LDL (lousy/bad cholesterol 132 < 55   Medication changes:  We will start the process to get Repatha covered by your insurance.  Once the prior authorization is complete, I will call/send a MyChart message to let you know and confirm pharmacy information.   You will take one injection every 14 days  Lab orders:  We want to repeat labs after 2-3 months.  We will send you a lab order to remind you once we get closer to that time.     Thank you for choosing [facility]

## 2024-06-03 NOTE — Telephone Encounter (Signed)
 Please do PA for Repatha

## 2024-06-03 NOTE — Assessment & Plan Note (Signed)
 Assessment: Patient with ASCVD not at LDL goal of < 55 Most recent LDL 132 on 02/02/2024 Not able to tolerate statins secondary to myalgias - rosuvastatin, atorvastati Reviewed options for lowering LDL cholesterol, including PCSK-9 inhibitors and inclisiran.  Discussed mechanisms of action, dosing, side effects, potential decreases in LDL cholesterol and costs.  Also reviewed potential options for patient assistance.  Plan: Patient agreeable to starting Repatha Repeat labs after:  3 months Lipid Liver function

## 2024-06-04 ENCOUNTER — Telehealth: Payer: Self-pay | Admitting: Pharmacy Technician

## 2024-06-04 ENCOUNTER — Other Ambulatory Visit (HOSPITAL_COMMUNITY): Payer: Self-pay

## 2024-06-04 DIAGNOSIS — E785 Hyperlipidemia, unspecified: Secondary | ICD-10-CM

## 2024-06-04 NOTE — Telephone Encounter (Signed)
 Pharmacy Patient Advocate Encounter  Received notification from [employer] that Prior Authorization for Repatha has been APPROVED from 06/04/24 to 07/09/25. Ran test claim, Copay is $0.00- one month. This test claim was processed through [facility]- copay amounts may vary at other pharmacies due to pharmacy/plan contracts, or as the patient moves through the different stages of their insurance plan.   PA #/Case ID/Reference #: [id number]

## 2024-06-04 NOTE — Telephone Encounter (Signed)
 Pharmacy Patient Advocate Encounter   Received notification from Physician's Office that prior authorization for repatha is required/requested.   Insurance verification completed.   The patient is insured through [employer].   Per test claim: PA required; PA submitted to above mentioned insurance via Latent Key/confirmation #/EOC [id number] Status is pending

## 2024-06-08 ENCOUNTER — Encounter: Payer: Self-pay | Admitting: Pharmacist Clinician (PhC)/ Clinical Pharmacy Specialist

## 2024-06-08 DIAGNOSIS — E785 Hyperlipidemia, unspecified: Secondary | ICD-10-CM

## 2024-06-08 MED ORDER — REPATHA SURECLICK 140 MG/ML ~~LOC~~ SOAJ: 140.0000 mg | SUBCUTANEOUS | 3 refills | Status: AC

## 2024-06-08 NOTE — Addendum Note (Signed)
 Addended by: Hiyab Nhem L on: 06/08/2024 02:16 PM   Modules accepted: Orders

## 2024-06-11 ENCOUNTER — Telehealth: Payer: Self-pay

## 2024-06-11 NOTE — Telephone Encounter (Signed)
-----   Message from Nurse Sherri P sent at 05/02/2024 12:21 PM EDT ----- Regarding: 07/17/2024  AFib ablation Precert:  MD: Camnitz Type of ablation: A-fib Diagnosis: tachycardia CPT code: A-fib (06343) Ablation scheduled (date/time): 07/17/24  11:30 am  Procedure:  Added to calendar? Yes Orders entered? Yes Letter complete? No, >30 days before procedure Scheduled with cath lab? Yes Any medications to hold? Yes (please list hold instructions): routine Labs ordered (CBC, BMET, PT/INR if on warfarin): Yes Mapping system: Doesn't matter CARTO/OPAL rep notified? No Cardiac CT needed? No Dye allergy? N/a Pre-meds ordered and instructions given? N/a Letter method: MyChart H&P: 10/20 Device: No  Follow-up:  Cassie/Angel, please schedule Routine.  ([place] patient)

## 2024-06-24 ENCOUNTER — Telehealth: Payer: Self-pay | Admitting: Cardiology

## 2024-06-24 ENCOUNTER — Ambulatory Visit

## 2024-06-24 VITALS — BP 168/88 | HR 92 | Ht 61.0 in | Wt 216.0 lb

## 2024-06-24 DIAGNOSIS — I480 Paroxysmal atrial fibrillation: Secondary | ICD-10-CM | POA: Diagnosis not present

## 2024-06-24 NOTE — Progress Notes (Signed)
° °  Nurse Visit   Date of Encounter: 06/24/2024 ID: Calton Rilla Lipoma, DOB [birth year], MRN [id number]  PCP:  Trudy Wanda MATSU, FNP   [facility] Providers Cardiologist:  Lamar Fitch, MD Electrophysiologist:  Soyla Gladis Norton, MD      Visit Details   VS:  BP (!) 168/88 (BP Location: Left Arm, Patient Position: Sitting)   Pulse 92   Ht 5' 1 (1.549 m)   Wt 216 lb (98 kg)   LMP 09/15/2017   SpO2 98%   BMI 40.81 kg/m  , BMI Body mass index is 40.81 kg/m.  Wt Readings from Last 3 Encounters:  06/24/24 216 lb (98 kg)  04/29/24 213 lb (96.6 kg)  04/28/24 215 lb (97.5 kg)     Reason for visit: Short of breath, irreg HR Performed today: EKG, Vitals, Reviewed by Dr. Fitch Changes (medications, testing, etc.) : No changes Length of Visit: 20 minutes Pt on Antibiotic, Colchicine  , Prednisone  for gout    Medications Adjustments/Labs and Tests Ordered: Orders Placed This Encounter  Procedures   EKG 12-Lead   No orders of the defined types were placed in this encounter.    Signed, Olam JONETTA Lesches, RN  06/24/2024 2:19 PM

## 2024-06-24 NOTE — Telephone Encounter (Signed)
 Pt c/o of Chest Pain: STAT if active (IN THIS MOMENT) CP, including tightness, pressure, jaw pain, shoulder/upper arm/back pain, SOB, nausea, and vomiting.  1. Are you having CP right now (tightness, pressure, or discomfort)? No   Pt called in stating she can feel that her hr is elevated and wants to come by the office to have someone check her vitals. She states she occasionally she has a little twing om her chest with it.   2. Are you experiencing any other symptoms (ex. SOB, nausea, vomiting, sweating)? No   3. How long have you been experiencing CP? Started 2 days ago   4. Is your CP continuous or coming and going? Coming and going   5. Have you taken Nitroglycerin ? No   6. If CP returns before callback, please consider calling 911. ?

## 2024-06-24 NOTE — Telephone Encounter (Signed)
 Pt called c/o some increased HR and some twinges of chest pain -Per Dr. Bernie pt scheduled to come for NV for vitals and EKG this afternoon.

## 2024-06-25 ENCOUNTER — Telehealth (HOSPITAL_COMMUNITY): Payer: Self-pay

## 2024-06-25 NOTE — Telephone Encounter (Signed)
 Spoke with patient to complete pre-procedure call.     Health status review:  Any new medical conditions, recent signs of acute illness or been started on antibiotics? Yes; Patient has idiopathic chronic gout of multiple sites with tophus. She is currently receiving treatment for a flare up of her arms, which caused an open area to the right elbow. She denies any drainage at this time. Reports hand and wrist swelling.  Treatment includes cephalexin , prednisone  and colchicine . Reports she is often prescribed a repeat course of prednisone  and inquires if this will interfere with procedure if still taking. Will forward to Dr. Inocencio to advise.     Any recent hospitalizations or surgeries? No  Follow all medication instructions prior to procedure or the procedure may be rescheduled:    Continue taking Eliquis  (Apixaban ) twice daily without missing any doses before procedure.  Essential chronic medications:  No medication should be continued, unless told otherwise.  HOLD: Semaglutide (Ozempic, Rybelsus, Wegovy) for 1 week prior to the procedure. Last dose on or before Wednesday, December 31. On the morning of your procedure DO NOT take any medication., including Eliquis  (Apixaban ).  Nothing to eat or drink after midnight prior to your procedure.  Pre-procedure testing scheduled: CT not required and lab work by December 26.  Confirmed patient is scheduled for Atrial Fibrillation Ablation on Thursday, January 8 with Dr. Inocencio. Instructed patient to arrive at the Main Entrance A at [facility]: [address] [place], [place] [address] and check in at Admitting at 9:30 AM.  Plan to go home the same day, you will only stay overnight if medically necessary. You MUST have a responsible adult to drive you home and MUST be with you the first 24 hours after you arrive home or your procedure could be cancelled.  Informed a nurse may call a day before the procedure to confirm arrival time and ensure  instructions are followed.  Patient verbalized understanding to information provided and is agreeable to proceed with procedure.   Advised to contact RN Navigator at [phone], to inform of any new medications started after call or concerns prior to procedure.

## 2024-07-04 ENCOUNTER — Encounter: Payer: Self-pay | Admitting: Cardiology

## 2024-07-04 ENCOUNTER — Other Ambulatory Visit: Payer: Self-pay

## 2024-07-04 DIAGNOSIS — E785 Hyperlipidemia, unspecified: Secondary | ICD-10-CM

## 2024-07-05 LAB — CBC
Hematocrit: 44.6 % (ref 34.0–46.6)
Hemoglobin: 14.4 g/dL (ref 11.1–15.9)
MCH: 27.9 pg (ref 26.6–33.0)
MCHC: 32.3 g/dL (ref 31.5–35.7)
MCV: 86 fL (ref 79–97)
Platelets: 359 x10E3/uL (ref 150–450)
RBC: 5.16 x10E6/uL (ref 3.77–5.28)
RDW: 15.3 % (ref 11.7–15.4)
WBC: 13.8 x10E3/uL — ABNORMAL HIGH (ref 3.4–10.8)

## 2024-07-05 LAB — BASIC METABOLIC PANEL WITH GFR
BUN/Creatinine Ratio: 11 (ref 9–23)
BUN: 16 mg/dL (ref 6–24)
CO2: 21 mmol/L (ref 20–29)
Calcium: 9.7 mg/dL (ref 8.7–10.2)
Chloride: 100 mmol/L (ref 96–106)
Creatinine, Ser: 1.41 mg/dL — ABNORMAL HIGH (ref 0.57–1.00)
Glucose: 100 mg/dL — ABNORMAL HIGH (ref 70–99)
Potassium: 4.2 mmol/L (ref 3.5–5.2)
Sodium: 139 mmol/L (ref 134–144)
eGFR: 45 mL/min/1.73 — ABNORMAL LOW

## 2024-07-11 NOTE — Telephone Encounter (Signed)
 Attempted to reach patient to follow up on symptoms, wound healing and to inquire if patient no longer taking Prednisone . Per Dr. Inocencio from MyChart message, patient needs to be off Prednisone  5 days before procedure (Jan 2). Left VM for patient to return call.

## 2024-07-14 NOTE — Telephone Encounter (Signed)
 Attempted to reach patient. Call answered and could hear someone talking briefly in the background before call disconnected. Called patient back- no answer. Left VM for patient to return call.

## 2024-07-14 NOTE — Telephone Encounter (Signed)
 Patient returned call to discuss upcoming procedure. Reports her last dose of Prednisone  was Jan 1 and that the open area on her right arm has healed. She mentioned there has been multiple family members in her household that have been tested positive for flu, but she denies any active s/o infection. Reviewed instructions with patient:  Arrival time- 0930 on 07/17/24 to [facility] admitting Do NOT miss any doses of Eliquis   Nothing to eat or drink after midnight No meds AM of procedure, including Eliquis   Last dose of Ozempic on 07/06/24 Responsible person to drive you home and stay with you for 24 hours Contact for any changes in health status or start of any new medications Contact information for Nurse Navigator provided.

## 2024-07-16 NOTE — Pre-Procedure Instructions (Signed)
 Attempted to call patient regarding procedure instructions.  Left voicemail on the following items: Arrival time 0930 Nothing to eat or drink after midnight No meds AM of procedure Responsible person to drive you home and stay with you for 24 hrs  Have you missed any doses of anti-coagulant Eliquis - should be taking twice a day, if you have missed any doses please let us  know.  Don't take dose morning of procedure.  Last ozempic should have been 12/28.

## 2024-07-17 ENCOUNTER — Ambulatory Visit (HOSPITAL_COMMUNITY)
Admission: RE | Admit: 2024-07-17 | Discharge: 2024-07-17 | Disposition: A | Discharge status: Home / Self Care | Attending: Cardiology | Admitting: Cardiology

## 2024-07-17 ENCOUNTER — Ambulatory Visit (HOSPITAL_COMMUNITY): Payer: Self-pay | Admitting: Registered Nurse

## 2024-07-17 ENCOUNTER — Ambulatory Visit (HOSPITAL_COMMUNITY)
Admission: RE | Disposition: A | Discharge status: Home / Self Care | Payer: Self-pay | Source: Home / Self Care | Attending: Cardiology

## 2024-07-17 ENCOUNTER — Other Ambulatory Visit: Payer: Self-pay

## 2024-07-17 DIAGNOSIS — E1122 Type 2 diabetes mellitus with diabetic chronic kidney disease: Secondary | ICD-10-CM | POA: Diagnosis not present

## 2024-07-17 DIAGNOSIS — Z87891 Personal history of nicotine dependence: Secondary | ICD-10-CM | POA: Diagnosis not present

## 2024-07-17 DIAGNOSIS — I483 Typical atrial flutter: Secondary | ICD-10-CM

## 2024-07-17 DIAGNOSIS — I5041 Acute combined systolic (congestive) and diastolic (congestive) heart failure: Secondary | ICD-10-CM

## 2024-07-17 DIAGNOSIS — I130 Hypertensive heart and chronic kidney disease with heart failure and stage 1 through stage 4 chronic kidney disease, or unspecified chronic kidney disease: Secondary | ICD-10-CM | POA: Diagnosis not present

## 2024-07-17 DIAGNOSIS — I110 Hypertensive heart disease with heart failure: Secondary | ICD-10-CM | POA: Diagnosis not present

## 2024-07-17 DIAGNOSIS — J4489 Other specified chronic obstructive pulmonary disease: Secondary | ICD-10-CM | POA: Diagnosis not present

## 2024-07-17 DIAGNOSIS — Z955 Presence of coronary angioplasty implant and graft: Secondary | ICD-10-CM | POA: Insufficient documentation

## 2024-07-17 DIAGNOSIS — I5022 Chronic systolic (congestive) heart failure: Secondary | ICD-10-CM | POA: Diagnosis not present

## 2024-07-17 DIAGNOSIS — I4891 Unspecified atrial fibrillation: Secondary | ICD-10-CM | POA: Diagnosis not present

## 2024-07-17 DIAGNOSIS — I2510 Atherosclerotic heart disease of native coronary artery without angina pectoris: Secondary | ICD-10-CM | POA: Diagnosis not present

## 2024-07-17 DIAGNOSIS — Z79899 Other long term (current) drug therapy: Secondary | ICD-10-CM | POA: Diagnosis not present

## 2024-07-17 DIAGNOSIS — I4819 Other persistent atrial fibrillation: Secondary | ICD-10-CM | POA: Diagnosis not present

## 2024-07-17 DIAGNOSIS — N183 Chronic kidney disease, stage 3 unspecified: Secondary | ICD-10-CM | POA: Diagnosis not present

## 2024-07-17 DIAGNOSIS — E039 Hypothyroidism, unspecified: Secondary | ICD-10-CM | POA: Insufficient documentation

## 2024-07-17 DIAGNOSIS — J8410 Pulmonary fibrosis, unspecified: Secondary | ICD-10-CM | POA: Diagnosis not present

## 2024-07-17 DIAGNOSIS — R000 Tachycardia, unspecified: Secondary | ICD-10-CM

## 2024-07-17 DIAGNOSIS — I4719 Other supraventricular tachycardia: Secondary | ICD-10-CM | POA: Diagnosis not present

## 2024-07-17 HISTORY — PX: ATRIAL FIBRILLATION ABLATION: EP1191

## 2024-07-17 LAB — GLUCOSE, CAPILLARY
Glucose-Capillary: 103 mg/dL — ABNORMAL HIGH (ref 70–99)
Glucose-Capillary: 171 mg/dL — ABNORMAL HIGH (ref 70–99)

## 2024-07-17 LAB — POCT ACTIVATED CLOTTING TIME
Activated Clotting Time: 255 s
Activated Clotting Time: 327 s
Activated Clotting Time: 378 s

## 2024-07-17 MED ORDER — HEPARIN SODIUM (PORCINE) 1000 UNIT/ML IJ SOLN
INTRAMUSCULAR | Status: DC | PRN
  Administered 2024-07-17: 1000 [IU] via INTRAVENOUS

## 2024-07-17 MED ORDER — ONDANSETRON HCL 4 MG/2ML IJ SOLN
INTRAMUSCULAR | Status: DC | PRN
  Administered 2024-07-17: 4 mg via INTRAVENOUS

## 2024-07-17 MED ORDER — SODIUM CHLORIDE 0.9 % IV SOLN: 250.0000 mL | INTRAVENOUS | Status: DC | PRN

## 2024-07-17 MED ORDER — DEXAMETHASONE SOD PHOSPHATE PF 10 MG/ML IJ SOLN
INTRAMUSCULAR | Status: DC | PRN
  Administered 2024-07-17: 10 mg via INTRAVENOUS

## 2024-07-17 MED ORDER — PROTAMINE SULFATE 10 MG/ML IV SOLN
INTRAVENOUS | Status: DC | PRN
  Administered 2024-07-17: 40 mg via INTRAVENOUS

## 2024-07-17 MED ORDER — PHENYLEPHRINE 80 MCG/ML (10ML) SYRINGE FOR IV PUSH (FOR BLOOD PRESSURE SUPPORT)
PREFILLED_SYRINGE | INTRAVENOUS | Status: DC | PRN
  Administered 2024-07-17 (×4): 160 ug via INTRAVENOUS

## 2024-07-17 MED ORDER — HEPARIN SODIUM (PORCINE) 1000 UNIT/ML IJ SOLN
INTRAMUSCULAR | Status: AC
  Filled 2024-07-17: qty 10

## 2024-07-17 MED ORDER — ROCURONIUM BROMIDE 10 MG/ML (PF) SYRINGE
PREFILLED_SYRINGE | INTRAVENOUS | Status: DC | PRN
  Administered 2024-07-17: 10 mg via INTRAVENOUS
  Administered 2024-07-17: 60 mg via INTRAVENOUS
  Administered 2024-07-17 (×3): 10 mg via INTRAVENOUS

## 2024-07-17 MED ORDER — FENTANYL CITRATE (PF) 100 MCG/2ML IJ SOLN
INTRAMUSCULAR | Status: AC
  Filled 2024-07-17: qty 2

## 2024-07-17 MED ORDER — SODIUM CHLORIDE 0.9 % IV SOLN: INTRAVENOUS | Status: DC

## 2024-07-17 MED ORDER — HEPARIN SODIUM (PORCINE) 1000 UNIT/ML IJ SOLN
INTRAMUSCULAR | Status: DC | PRN
  Administered 2024-07-17: 15000 [IU] via INTRAVENOUS
  Administered 2024-07-17: 9000 [IU] via INTRAVENOUS
  Administered 2024-07-17: 4000 [IU] via INTRAVENOUS

## 2024-07-17 MED ORDER — FENTANYL CITRATE (PF) 100 MCG/2ML IJ SOLN
25.0000 ug | INTRAMUSCULAR | Status: DC | PRN
  Administered 2024-07-17: 50 ug via INTRAVENOUS

## 2024-07-17 MED ORDER — ONDANSETRON HCL 4 MG/2ML IJ SOLN: 4.0000 mg | Freq: Four times a day (QID) | INTRAMUSCULAR | Status: DC | PRN

## 2024-07-17 MED ORDER — ACETAMINOPHEN 325 MG PO TABS: 650.0000 mg | ORAL_TABLET | ORAL | Status: DC | PRN

## 2024-07-17 MED ORDER — MIDAZOLAM HCL (PF) 2 MG/2ML IJ SOLN
INTRAMUSCULAR | Status: DC | PRN
  Administered 2024-07-17: 2 mg via INTRAVENOUS

## 2024-07-17 MED ORDER — DILTIAZEM HCL ER COATED BEADS 240 MG PO CP24
240.0000 mg | ORAL_CAPSULE | Freq: Once | ORAL | Status: AC
  Administered 2024-07-17: 240 mg via ORAL
  Filled 2024-07-17 (×2): qty 1

## 2024-07-17 MED ORDER — LIDOCAINE 2% (20 MG/ML) 5 ML SYRINGE
INTRAMUSCULAR | Status: DC | PRN
  Administered 2024-07-17: 60 mg via INTRAVENOUS

## 2024-07-17 MED ORDER — SODIUM CHLORIDE 0.9% FLUSH: 3.0000 mL | Freq: Two times a day (BID) | INTRAVENOUS | Status: DC

## 2024-07-17 MED ORDER — SUGAMMADEX SODIUM 200 MG/2ML IV SOLN
INTRAVENOUS | Status: DC | PRN
  Administered 2024-07-17: 300 mg via INTRAVENOUS

## 2024-07-17 MED ORDER — MIDAZOLAM HCL 2 MG/2ML IJ SOLN
INTRAMUSCULAR | Status: AC
  Filled 2024-07-17: qty 2

## 2024-07-17 MED ORDER — PHENYLEPHRINE HCL-NACL 20-0.9 MG/250ML-% IV SOLN
INTRAVENOUS | Status: DC | PRN
  Administered 2024-07-17: 35 ug/min via INTRAVENOUS

## 2024-07-17 MED ORDER — PROPOFOL 10 MG/ML IV BOLUS
INTRAVENOUS | Status: DC | PRN
  Administered 2024-07-17: 150 mg via INTRAVENOUS

## 2024-07-17 MED ORDER — SODIUM CHLORIDE 0.9% FLUSH: 3.0000 mL | INTRAVENOUS | Status: DC | PRN

## 2024-07-17 MED ORDER — FENTANYL CITRATE (PF) 250 MCG/5ML IJ SOLN
INTRAMUSCULAR | Status: DC | PRN
  Administered 2024-07-17: 100 ug via INTRAVENOUS

## 2024-07-17 MED ORDER — PROTAMINE SULFATE 10 MG/ML IV SOLN: INTRAVENOUS | Status: DC | PRN

## 2024-07-17 MED ORDER — ATROPINE SULFATE 1 MG/ML IV SOLN
INTRAVENOUS | Status: DC | PRN
  Administered 2024-07-17: 1 mg via INTRAVENOUS

## 2024-07-17 MED ORDER — HEPARIN (PORCINE) IN NACL 1000-0.9 UT/500ML-% IV SOLN
INTRAVENOUS | Status: DC | PRN
  Administered 2024-07-17 (×2): 500 mL

## 2024-07-17 NOTE — H&P (Signed)
" °  Electrophysiology Office Note:   Date:  07/17/2024  ID:  Katie Rasmussen, DOB 1970-10-29, MRN [id number]  Primary Cardiologist: Lamar Fitch, MD Primary Heart Failure: None Electrophysiologist: Ellorie Kindall Gladis Norton, MD      History of Present Illness:   Katie Rasmussen is a 54 y.o. female with h/o hypothyroidism, pulmonary fibrosis, CKD stage III, coronary artery disease, chronic systolic heart failure with recovered ejection fraction, SVT seen today for routine electrophysiology followup.   Today, denies symptoms of palpitations, chest pain, dyspnea, orthopnea, PND, lower extremity edema, claudication, dizziness, presyncope, syncope, bleeding, or neurologic sequela. The patient is tolerating medications without difficulties. Plan ablation today.   EP Information / Studies Reviewed:    EKG is ordered today. Personal review as below.        Risk Assessment/Calculations:    CHA2DS2-VASc Score = 4   This indicates a 4.8% annual risk of stroke. The patient's score is based upon: CHF History: 0 HTN History: 1 Diabetes History: 1 Stroke History: 0 Vascular Disease History: 1 Age Score: 0 Gender Score: 1           Physical Exam:   VS:  BP (!) 163/92   Pulse (!) 112   Temp 98 F (36.7 C) (Oral)   Resp 19   Ht 5' 1 (1.549 m)   Wt 97.5 kg   LMP 09/15/2017   SpO2 97%   BMI 40.62 kg/m    Wt Readings from Last 3 Encounters:  07/17/24 97.5 kg  06/24/24 98 kg  04/29/24 96.6 kg    GEN: Well nourished, well developed in no acute distress NECK: No JVD; No carotid bruits CARDIAC: Tachycardic RESPIRATORY:  Clear to auscultation without rales, wheezing or rhonchi  ABDOMEN: Soft, non-tender, non-distended EXTREMITIES:  No edema; No deformity    ASSESSMENT AND PLAN:    1.  Paroxysmal atrial fibrillation/flutter: Katie Rasmussen has presented today for surgery, with the diagnosis of AF.  The various methods of treatment have been discussed with  the patient and family. After consideration of risks, benefits and other options for treatment, the patient has consented to  Procedure(s): Catheter ablation as a surgical intervention .  Risks include but not limited to complete heart block, stroke, esophageal damage, nerve damage, bleeding, vascular damage, tamponade, perforation, MI, and death. The patient's history has been reviewed, patient examined, no change in status, stable for surgery.  I have reviewed the patient's chart and labs.  Questions were answered to the patient's satisfaction.    Satya Buttram Norton, MD 07/17/2024 10:22 AM  "

## 2024-07-17 NOTE — Anesthesia Procedure Notes (Signed)
 Procedure Name: Intubation Date/Time: 07/17/2024 11:12 AM  Performed by: Christopher Comings, CRNAPre-anesthesia Checklist: Patient identified, Emergency Drugs available, Suction available and Patient being monitored Patient Re-evaluated:Patient Re-evaluated prior to induction Oxygen Delivery Method: Circle system utilized Preoxygenation: Pre-oxygenation with 100% oxygen Induction Type: IV induction Ventilation: Mask ventilation without difficulty and Oral airway inserted - appropriate to patient size Laryngoscope Size: Mac and 4 Grade View: Grade I Tube type: Oral Tube size: 7.0 mm Number of attempts: 1 Airway Equipment and Method: Stylet and Oral airway Placement Confirmation: ETT inserted through vocal cords under direct vision, positive ETCO2 and breath sounds checked- equal and bilateral Secured at: 21 cm Tube secured with: Tape Dental Injury: Teeth and Oropharynx as per pre-operative assessment

## 2024-07-17 NOTE — Anesthesia Preprocedure Evaluation (Addendum)
"                                    Anesthesia Evaluation  Patient identified by MRN, date of birth, ID band Patient awake    Reviewed: Allergy & Precautions, NPO status , Patient's Chart, lab work & pertinent test results  Airway Mallampati: II  TM Distance: >3 FB Neck ROM: Full    Dental no notable dental hx. (+) Upper Dentures, Lower Dentures   Pulmonary asthma , sleep apnea , COPD,  COPD inhaler, former smoker   Pulmonary exam normal        Cardiovascular hypertension, Pt. on medications + CAD, + Cardiac Stents and +CHF   Rhythm:Irregular Rate:Normal  IMPRESSIONS    1. Left ventricular ejection fraction, by estimation, is 60 to 65%. The left ventricle has normal function. The left ventricle has no regional wall motion abnormalities. Left ventricular diastolic parameters are indeterminate. Elevated left ventricular end-diastolic pressure. The average left ventricular global longitudinal strain is 19.3 %. The global longitudinal strain is normal.  2. Right ventricular systolic function is normal. The right ventricular size is normal.  3. The mitral valve is normal in structure. No evidence of mitral valve regurgitation. No evidence of mitral stenosis.  4. The aortic valve has an indeterminant number of cusps. Aortic valve regurgitation is not visualized. No aortic stenosis is present.  5. The inferior vena cava is normal in size with greater than 50% respiratory variability, suggesting right atrial pressure of 3 mmHg.    Neuro/Psych negative neurological ROS  negative psych ROS   GI/Hepatic negative GI ROS, Neg liver ROS,,,  Endo/Other  diabetesHypothyroidism    Renal/GU CRFRenal disease     Musculoskeletal   Abdominal Normal abdominal exam  (+)   Peds  Hematology   Anesthesia Other Findings   Reproductive/Obstetrics                              Anesthesia Physical Anesthesia Plan  ASA: 3  Anesthesia Plan: General    Post-op Pain Management:    Induction: Intravenous  PONV Risk Score and Plan: 3 and Ondansetron , Dexamethasone , Midazolam  and Treatment may vary due to age or medical condition  Airway Management Planned: Mask and Oral ETT  Additional Equipment: None  Intra-op Plan:   Post-operative Plan: Extubation in OR  Informed Consent: I have reviewed the patients History and Physical, chart, labs and discussed the procedure including the risks, benefits and alternatives for the proposed anesthesia with the patient or authorized representative who has indicated his/her understanding and acceptance.     Dental advisory given  Plan Discussed with:   Anesthesia Plan Comments:          Anesthesia Quick Evaluation  "

## 2024-07-17 NOTE — Discharge Instructions (Signed)

## 2024-07-17 NOTE — Progress Notes (Signed)
 Made Anesthesiology Stoltzfus aware of grand children testing positive for the flu.

## 2024-07-17 NOTE — Progress Notes (Addendum)
 Patient and patient husband given discharge instructions, education provided no further questions at this time. Patient able to ambulate and void before discharge. Able to tolerate PO intake. Patient site is clean, dry, intact with no hematoma noted upon discharge. Seen by MD, verified when to resume Eliquis .

## 2024-07-17 NOTE — Anesthesia Postprocedure Evaluation (Signed)
"   Anesthesia Post Note  Patient: [name]  Procedure(s) Performed: ATRIAL FIBRILLATION ABLATION     Patient location during evaluation: PACU Anesthesia Type: General Level of consciousness: awake and alert, oriented and patient cooperative Pain management: pain level controlled Vital Signs Assessment: post-procedure vital signs reviewed and stable Respiratory status: spontaneous breathing, nonlabored ventilation and respiratory function stable Cardiovascular status: blood pressure returned to baseline and stable Postop Assessment: no apparent nausea or vomiting Anesthetic complications: no   There were no known notable events for this encounter.  Last Vitals:  Vitals:   07/17/24 1525 07/17/24 1540  BP: 111/80 121/79  Pulse: (!) 102 (!) 107  Resp: 17 14  Temp:    SpO2: 96% 91%    Last Pain:  Vitals:   07/17/24 1505  TempSrc:   PainSc: 3                  Katie Rasmussen      "

## 2024-07-17 NOTE — Transfer of Care (Signed)
 Immediate Anesthesia Transfer of Care Note  Patient: Katie Rasmussen  Procedure(s) Performed: ATRIAL FIBRILLATION ABLATION  Patient Location: PACU  Anesthesia Type:General  Level of Consciousness: awake, alert , and oriented  Airway & Oxygen Therapy: Patient connected to nasal cannula oxygen  Post-op Assessment: Report given to RN and Post -op Vital signs reviewed and stable  Post vital signs: Reviewed and stable  Last Vitals:  Vitals Value Taken Time  BP 94/77 07/17/24 14:15  Temp    Pulse 110 07/17/24 14:16  Resp 14 07/17/24 14:16  SpO2 98 % 07/17/24 14:16  Vitals shown include unfiled device data.  Last Pain:  Vitals:   07/17/24 1012  TempSrc:   PainSc: 4          Complications: There were no known notable events for this encounter.

## 2024-07-18 ENCOUNTER — Telehealth (HOSPITAL_COMMUNITY): Payer: Self-pay

## 2024-07-18 ENCOUNTER — Encounter (HOSPITAL_COMMUNITY): Payer: Self-pay | Admitting: Cardiology

## 2024-07-18 MED FILL — Fentanyl Citrate Preservative Free (PF) Inj 100 MCG/2ML: INTRAMUSCULAR | Qty: 2 | Status: AC

## 2024-07-18 NOTE — Telephone Encounter (Signed)
 Spoke with patient to complete post procedure follow up call.  Patient reports no complications with groin sites.   Instructions reviewed with patient:  Remove large bandage at puncture site after 24 hours. It is normal to have bruising, tenderness, mild swelling, and a pea or marble sized lump/knot at the groin site which can take up to three months to resolve.  Get help right away if you notice sudden swelling at the puncture site.  Check your puncture site every day for signs of infection: fever, redness, swelling, pus drainage, warmth, foul odor or excessive pain. If this occurs, please call [phone], to speak with the RN Navigator. Get help right away if your puncture site is bleeding and the bleeding does not stop after applying firm pressure to the area.  You may continue to have skipped beats/ atrial fibrillation during the first several months after your procedure.  It is very important not to miss any doses of your blood thinner Eliquis .    You will follow up with the Afib clinic 4 weeks after your procedure and follow up with the Afib clinic 3 months after your procedure.  Activity restrictions reviewed.  Patient verbalized understanding to all instructions provided.

## 2024-07-18 NOTE — Telephone Encounter (Signed)
 Attempted to reach patient to follow up with procedure completed on 07/17/24, no answer. Left VM for patient to return call.

## 2024-07-30 ENCOUNTER — Ambulatory Visit: Attending: Cardiology | Admitting: Cardiology

## 2024-07-30 ENCOUNTER — Encounter: Payer: Self-pay | Admitting: Cardiology

## 2024-07-30 VITALS — BP 110/66 | HR 92 | Ht 61.0 in | Wt 213.1 lb

## 2024-07-30 DIAGNOSIS — G4733 Obstructive sleep apnea (adult) (pediatric): Secondary | ICD-10-CM | POA: Diagnosis not present

## 2024-07-30 DIAGNOSIS — I255 Ischemic cardiomyopathy: Secondary | ICD-10-CM

## 2024-07-30 DIAGNOSIS — J42 Unspecified chronic bronchitis: Secondary | ICD-10-CM

## 2024-07-30 DIAGNOSIS — Z8679 Personal history of other diseases of the circulatory system: Secondary | ICD-10-CM | POA: Diagnosis not present

## 2024-07-30 DIAGNOSIS — I480 Paroxysmal atrial fibrillation: Secondary | ICD-10-CM

## 2024-07-30 DIAGNOSIS — I2510 Atherosclerotic heart disease of native coronary artery without angina pectoris: Secondary | ICD-10-CM

## 2024-07-30 DIAGNOSIS — Z9889 Other specified postprocedural states: Secondary | ICD-10-CM | POA: Diagnosis not present

## 2024-07-30 NOTE — Progress Notes (Unsigned)
 " Cardiology Office Note:    Date:  07/30/2024   ID:  Katie Rasmussen, DOB Dec 20, 1970, MRN [id number]  PCP:  Trudy Wanda MATSU, FNP  Cardiologist:  Lamar Fitch, MD    Referring MD: Trudy Wanda MATSU, FNP   No chief complaint on file. Doing fine  History of Present Illness:     Katie Rasmussen is a 54 y.o. female past medical history significant for coronary disease in 2009 she did have PTCA and stenting with drug-eluting stent to proximal LAD in face of acute myocardial infarction at that time ejection fraction 30 to 35% but with appropriate guideline directed medical therapy ejection fraction improved to normalization, additional problem include supraventricular tachycardia, atrial flutter, atrial fibrillation, atrial tachycardia, morbid obesity, essential hypertension.  Comes today to my office after recent quite extensive ablation procedure she did have ablation of atrial fibrillation with pulmonary vein isolation, she also had ablation of his most for atrial flutter as well as multiple ablations of atrial tachycardia.  She did well with the procedure back in my office doing well.  Denies having any chest pain tightness squeezing pressure burning chest.  No palpitations.  Past Medical History:  Diagnosis Date   A-fib (HCC) 01/09/2023   Acute combined systolic and diastolic CHF, NYHA class 3 (HCC)    Acute on chronic respiratory failure with hypoxia and hypercapnia (HCC) 09/14/2017   Ankle sprain 02/18/2020   Asthma exacerbation 02/18/2020   Bronchitis due to tobacco use 08/2017   Chest pain 02/18/2020   Chronic respiratory failure (HCC)    Chronic venous insufficiency 08/30/2022   CKD (chronic kidney disease), stage III (HCC)    Class 3 severe obesity with serious comorbidity in adult [facility]) 06/14/2022   COPD (chronic obstructive pulmonary disease) (HCC)    Coronary artery disease    DCM (dilated cardiomyopathy) (HCC)    Diastolic CHF (HCC)     Dyslipidemia 10/08/2017   Dyspnea on exertion 05/07/2018   Elevated troponin    Gout    Hypertension 02/18/2020   Hypothyroidism    h/o Grave's, s/p thyroidectomy & RAI in 1990s   Ischemic cardiomyopathy initial ejection fraction 35 to 40% in March 2019, normalization of ejection fraction based on echocardiogram from 2020 10/08/2017   Ejection fraction 35-40% in March 2019   Leukocytosis 02/18/2020   Obesity hypoventilation syndrome (HCC)    Orthostatic hypotension 02/18/2020   OSA (obstructive sleep apnea) 02/18/2020   Peripheral edema 02/18/2020   Pneumonia 08/2017   Postablative hypothyroidism 04/03/2019   Respiratory acidosis 02/18/2020   Sepsis (HCC) 02/18/2020   Supraventricular tachycardia 07/08/2020   Systolic CHF with reduced left ventricular function, NYHA class 3 (HCC) 09/13/2017   Type 2 diabetes mellitus with other specified complication (HCC) 07/26/2023   Upper respiratory infection 02/18/2020   Vitamin D deficiency 07/26/2023   Weakness 02/18/2020    Past Surgical History:  Procedure Laterality Date   ATRIAL FIBRILLATION ABLATION N/A 07/17/2024   Procedure: ATRIAL FIBRILLATION ABLATION;  Surgeon: Inocencio Soyla Lunger, MD;  Location: [facility] INVASIVE CV LAB;  Service: Cardiovascular;  Laterality: N/A;   CESAREAN SECTION     CORONARY STENT INTERVENTION N/A 09/18/2017   Procedure: CORONARY STENT INTERVENTION;  Surgeon: Wonda Sharper, MD;  Location: [facility] INVASIVE CV LAB;  Service: Cardiovascular;  Laterality: N/A;  prox LAD   LEFT HEART CATH AND CORONARY ANGIOGRAPHY N/A 09/18/2017   Procedure: LEFT HEART CATH AND CORONARY ANGIOGRAPHY;  Surgeon: Wonda Sharper, MD;  Location: [facility] INVASIVE CV LAB;  Service: Cardiovascular;  Laterality: N/A;   THYROID  SURGERY     s/p RAI for Grave's    Current Medications: Active Medications[1]   Allergies:   Enoxaparin , Iodine, Sulfa  antibiotics, Azithromycin, and Shellfish allergy   Social History   Socioeconomic History   Marital  status: Married    Spouse name: Not on file   Number of children: 3   Years of education: Not on file   Highest education level: Not on file  Occupational History   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  Tobacco Use   Smoking status: Former    Current packs/day: 0.00    Average packs/day: 0.5 packs/day for 14.0 years (7.0 ttl pk-yrs)    Types: Cigarettes    Start date: 09/13/2003    Quit date: 09/12/2017    Years since quitting: 6.8    Passive exposure: Past   Smokeless tobacco: Never  Vaping Use   Vaping status: Never Used  Substance and Sexual Activity   Alcohol use: No   Drug use: No   Sexual activity: Not on file  Other Topics Concern   Not on file  Social History Narrative   Not on file   Social Drivers of Health   Tobacco Use: Medium Risk (07/30/2024)   Patient History    Smoking Tobacco Use: Former    Smokeless Tobacco Use: Never    Passive Exposure: Past  Physicist, Medical Strain: Not on file  Food Insecurity: Low Risk (02/18/2024)   Received from [facility]   Epic    Within the past 12 months, you worried that your food would run out before you got money to buy more: Never true    Within the past 12 months, the food you bought just didn't last and you didn't have money to get more. : Never true  Transportation Needs: No Transportation Needs (02/18/2024)   Received from [facility]    In the past 12 months, has lack of reliable transportation kept you from medical appointments, meetings, work or from getting things needed for daily living? : No  Physical Activity: Not on file  Stress: Not on file  Social Connections: Not on file  Depression (PHQ2-9): Low Risk (01/29/2024)   Depression (PHQ2-9)    PHQ-2 Score: 0  Alcohol Screen: Not on file  Housing: Low Risk (02/18/2024)   Received from [facility]   Epic    What is your living situation today?: I have a steady place to live    Think about the place you live. Do you have  problems with any of the following? Choose all that apply:: None/None on this list  Utilities: Low Risk (02/18/2024)   Received from [facility]   Utilities    In the past 12 months has the electric, gas, oil, or water company threatened to shut off services in your home? : No  Health Literacy: Not on file     Family History: The patient's family history includes Diabetes in her father; Heart disease in her father and mother; Hypertension in her mother. ROS:   Please see the history of present illness.    All 14 point review of systems negative except as described per history of present illness  EKGs/Labs/Other Studies Reviewed:         Recent Labs: 04/30/2024: TSH 0.30 07/04/2024: BUN 16; Creatinine, Ser 1.41; Hemoglobin 14.4; Platelets 359; Potassium 4.2; Sodium 139  Recent Lipid Panel    Component Value Date/Time   CHOL 224 (H)  02/25/2021 1322   TRIG 335 (H) 02/25/2021 1322   HDL 64 02/25/2021 1322   CHOLHDL 3.5 02/25/2021 1322   LDLCALC 104 (H) 02/25/2021 1322    Physical Exam:    VS:  BP 110/66   Pulse 92   Ht 5' 1 (1.549 m)   Wt 213 lb 2 oz (96.7 kg)   LMP 09/15/2017   SpO2 97%   BMI 40.27 kg/m     Wt Readings from Last 3 Encounters:  07/30/24 213 lb 2 oz (96.7 kg)  07/17/24 215 lb (97.5 kg)  06/24/24 216 lb (98 kg)     GEN:  Well nourished, well developed in no acute distress HEENT: Normal NECK: No JVD; No carotid bruits LYMPHATICS: No lymphadenopathy CARDIAC: RRR, no murmurs, no rubs, no gallops RESPIRATORY:  Clear to auscultation without rales, wheezing or rhonchi  ABDOMEN: Soft, non-tender, non-distended MUSCULOSKELETAL:  No edema; No deformity  SKIN: Warm and dry LOWER EXTREMITIES: no swelling NEUROLOGIC:  Alert and oriented x 3 PSYCHIATRIC:  Normal affect   ASSESSMENT:    1. Paroxysmal atrial fibrillation (HCC)   2. Status post ablation of atrial fibrillation January 2026   3. Status post ablation of atrial flutter January 2026   4.  Coronary artery disease involving native coronary artery of native heart without angina pectoris   5. Ischemic cardiomyopathy initial ejection fraction 35 to 40% in March 2019, normalization of ejection fraction based on echocardiogram from 2020   6. OSA (obstructive sleep apnea)   7. Chronic bronchitis, unspecified chronic bronchitis type (HCC)    PLAN:    In order of problems listed above:  Paroxysmal atrial fibrillation status post ablation of atrial fibrillation atrial flutter and atrial tachycardia.  Doing well continue present medications which include anticoagulation.  Continue monitoring.  So far she is doing well. Coronary disease stable from that point review on guideline directed medical therapy which I continue. History of cardiomyopathy now with normal left ventricle ejection fraction. Obstructive sleep apnea followed by pulmonary.   Medication Adjustments/Labs and Tests Ordered: Current medicines are reviewed at length with the patient today.  Concerns regarding medicines are outlined above.  Orders Placed This Encounter  Procedures   EKG 12-Lead   Medication changes: No orders of the defined types were placed in this encounter.   Signed, Lamar DOROTHA Fitch, MD, [facility] 07/30/2024 2:21 PM    [facility]    [1]  Current Meds  Medication Sig   ACETAMINOPHEN  PO Take 650 mg by mouth every 6 (six) hours as needed for mild pain (pain score 1-3) or moderate pain (pain score 4-6).   albuterol  (PROVENTIL ) (2.5 MG/3ML) 0.083% nebulizer solution Take 3 mLs (2.5 mg total) by nebulization every 6 (six) hours as needed for wheezing or shortness of breath.   ALPRAZolam  (XANAX ) 0.5 MG tablet Take 0.5 mg by mouth at bedtime as needed for anxiety.   apixaban  (ELIQUIS ) 5 MG TABS tablet Take 1 tablet (5 mg total) by mouth 2 (two) times daily.   BREO ELLIPTA  200-25 MCG/ACT AEPB Inhale 1 puff into the lungs daily.   Calcium  Carbonate Antacid (TUMS PO) Take 1 tablet  by mouth daily as needed (heartburn).   cetirizine (ZYRTEC) 10 MG tablet Take 10 mg by mouth daily as needed (allergic reaction).   colchicine  0.6 MG tablet Take 1 tablet (0.6 mg total) by mouth at bedtime.   diltiazem  (TIAZAC ) 240 MG 24 hr capsule Take 1 capsule (240 mg total) by mouth daily.  Evolocumab  (REPATHA  SURECLICK) 140 MG/ML SOAJ Inject 140 mg into the skin every 14 (fourteen) days.   levothyroxine  (SYNTHROID ) 200 MCG tablet Take 1 tablet (200 mcg total) by mouth every morning.   metolazone  (ZAROXOLYN ) 2.5 MG tablet Take 1 tablet (2.5 mg total) by mouth once a week.   ondansetron  (ZOFRAN ) 4 MG tablet Take 4 mg by mouth See admin instructions. Once or twice daily as needed   OVER THE COUNTER MEDICATION Apply 1 Application topically daily as needed (hands and feet). CBD cream (4,000 units)   OZEMPIC, 2 MG/DOSE, 8 MG/3ML SOPN Inject 2 mg into the skin once a week.   polyethylene glycol (MIRALAX  / GLYCOLAX ) 17 g packet Take 17 g by mouth once a week.   potassium chloride  (KLOR-CON ) 10 MEQ tablet Take 1 tablet (10 mEq total) by mouth daily.   torsemide  (DEMADEX ) 20 MG tablet Take 1 tablet by mouth 2 times daily.   VENTOLIN  HFA 108 (90 Base) MCG/ACT inhaler INHALE 2 PUFFS BY MOUTH EVERY 4 HOURS ASNEEDED FOR SHORTNESS OF BREATH.   Vitamin D, Ergocalciferol, (DRISDOL) 1.25 MG (50000 UNIT) CAPS capsule Take 50,000 Units by mouth once a week.   [DISCONTINUED] OZEMPIC, 0.25 OR 0.5 MG/DOSE, 2 MG/3ML SOPN Inject 2 mg into the skin once a week.   Current Facility-Administered Medications for the 07/30/24 encounter (Office Visit) with Adorian Gwynne J, MD  Medication   triamcinolone  acetonide (KENALOG ) 10 MG/ML injection 10 mg   "

## 2024-07-30 NOTE — Patient Instructions (Signed)
 Medication Instructions:  Your physician recommends that you continue on your current medications as directed. Please refer to the Current Medication list given to you today.  *If you need a refill on your cardiac medications before your next appointment, please call your pharmacy*   Lab Work: None ordered If you have labs (blood work) drawn today and your tests are completely normal, you will receive your results only by: MyChart Message (if you have MyChart) OR A paper copy in the mail If you have any lab test that is abnormal or we need to change your treatment, we will call you to review the results.   Testing/Procedures: None ordered   Follow-Up: At [facility], you and your health needs are our priority.  As part of our continuing mission to provide you with exceptional heart care, we have created designated Provider Care Teams.  These Care Teams include your primary Cardiologist (physician) and Advanced Practice Providers (APPs -  Physician Assistants and Nurse Practitioners) who all work together to provide you with the care you need, when you need it.  We recommend signing up for the patient portal called "MyChart".  Sign up information is provided on this After Visit Summary.  MyChart is used to connect with patients for Virtual Visits (Telemedicine).  Patients are able to view lab/test results, encounter notes, upcoming appointments, etc.  Non-urgent messages can be sent to your provider as well.   To learn more about what you can do with MyChart, go to [email or web address].    Your next appointment:   4 month(s)  The format for your next appointment:   In Person  Provider:   Gypsy Balsam, MD    Other Instructions none  Important Information About Sugar

## 2024-08-05 ENCOUNTER — Encounter: Payer: Self-pay | Admitting: Allergy

## 2024-08-05 ENCOUNTER — Ambulatory Visit: Admitting: Allergy

## 2024-08-05 VITALS — BP 126/78 | HR 104 | Resp 16 | Ht 61.0 in | Wt 216.0 lb

## 2024-08-05 DIAGNOSIS — T782XXD Anaphylactic shock, unspecified, subsequent encounter: Secondary | ICD-10-CM | POA: Diagnosis not present

## 2024-08-05 DIAGNOSIS — Z91013 Allergy to seafood: Secondary | ICD-10-CM

## 2024-08-05 MED ORDER — EPINEPHRINE 0.3 MG/0.3ML IJ SOAJ: 0.3000 mg | INTRAMUSCULAR | 1 refills | Status: AC | PRN

## 2024-08-05 NOTE — Patient Instructions (Addendum)
 Anaphylactic reaction Acute anaphylaxis post-ingestion of [employer] chili, necessitating ER intervention and EpiPen  use.  - Prescribed EpiPen  for emergency use. - Follow emergency action plan in case of allergic reaction.  - Ordered allergy testing for alpha-gal, xanthan gum,  milk and soy.   Not concerned for other foods in the chili as you have eaten these foods since without issue (tomato, beans, peppers, onions, celery, chili spices) - Ordered tryptase level to assess allergy cell activation as well as environmental allergy panel. - Advised Zyrtec 5 mg for itching/hives due to renal impairment. - Advised Pepcid  20 mg for allergy symptoms.   History of shellfish allergy Reported shrimp allergy during pregnancy 30 years ago. No recent exposure or reactions. Consideration for outgrown allergy. - Ordered shellfish allergy testing. - Discussed potential food challenge if testing negative.  Follow-up in 3-4 months or sooner if needed

## 2024-08-05 NOTE — Progress Notes (Unsigned)
 "   New Patient Note  RE: Katie Rasmussen MRN: [id number] DOB: 05-30-1971 Date of Office Visit: 08/05/2024  Primary care provider: Neu, Ellen Louisa, NP  Chief Complaint: allergic reaction  History of present illness: Katie Rasmussen is a 54 y.o. female presenting today for evaluation of allergic reaction.  Discussed the use of AI scribe software for clinical note transcription with the patient, who gave verbal consent to proceed.  On December 16th, she experienced an allergic reaction after consuming chili from [facility], which was the only food she recalls eating that day, along with lightly salted Ritz crackers. Symptoms included itching, hives, redness, and shortness of breath. She was treated at [facility] ER with multiple EpiPen  injections over a three-day hospital stay due to symptom recurrence every four hours.  After the epinephrine  symptoms were resolved and they will come back and this occurred about every 4 hours over the 2 to 3-day hospitalization.  Since she was discharged she has not had any recurrence of symptoms.  She does not have access to an epinephrine  device at this time.  She states she does not eat a lot of red meat regularly but she will not eat chili on occasion.  Of note she states she was told by the hospital staff that she was the fourth person in the month that had symptoms following ingestion of food at the [facility] she went to.  She has a history of gout with tophi covering her body, one of which had ruptured and was cleaned with Betadine the day of the reaction above. Initially, she considered the reaction might be related to the wound cleaning.  She has been avoiding fast food since the incident and is concerned about potential allergens in the chili. She regularly consumes ingredients like tomatoes, garlic, onions, celery, bell peppers, and chili powder without issues. She has a salt restriction due to heart failure and avoids red meat, primarily  eating chicken. She had a past allergic reaction to shrimp during her first pregnancy 30 years ago and has avoided shellfish since.  She will look to be able to eat shellfish if she is not allergic any longer.  After her hospitalization she was prescribed several days addition of prednisone , Pepcid  and Benadryl  which she has completed all of these.     Review of systems: 10pt ROS negative unless noted above in HPi  Past medical history: Past Medical History:  Diagnosis Date   A-fib (HCC) 01/09/2023   Acute combined systolic and diastolic CHF, NYHA class 3 (HCC)    Acute on chronic respiratory failure with hypoxia and hypercapnia (HCC) 09/14/2017   Ankle sprain 02/18/2020   Asthma exacerbation 02/18/2020   Bronchitis due to tobacco use 08/2017   Chest pain 02/18/2020   Chronic respiratory failure (HCC)    Chronic venous insufficiency 08/30/2022   CKD (chronic kidney disease), stage III (HCC)    Class 3 severe obesity with serious comorbidity in adult [facility]) 06/14/2022   COPD (chronic obstructive pulmonary disease) (HCC)    Coronary artery disease    DCM (dilated cardiomyopathy) (HCC)    Diastolic CHF (HCC)    Dyslipidemia 10/08/2017   Dyspnea on exertion 05/07/2018   Elevated troponin    Gout    Hypertension 02/18/2020   Hypothyroidism    h/o Grave's, s/p thyroidectomy & RAI in 1990s   Ischemic cardiomyopathy initial ejection fraction 35 to 40% in March 2019, normalization of ejection fraction based on echocardiogram from 2020 10/08/2017   Ejection fraction 35-40%  in March 2019   Leukocytosis 02/18/2020   Obesity hypoventilation syndrome (HCC)    Orthostatic hypotension 02/18/2020   OSA (obstructive sleep apnea) 02/18/2020   Peripheral edema 02/18/2020   Pneumonia 08/2017   Postablative hypothyroidism 04/03/2019   Respiratory acidosis 02/18/2020   Sepsis (HCC) 02/18/2020   Supraventricular tachycardia 07/08/2020   Systolic CHF with reduced left ventricular function, NYHA  class 3 (HCC) 09/13/2017   Type 2 diabetes mellitus with other specified complication (HCC) 07/26/2023   Upper respiratory infection 02/18/2020   Vitamin D deficiency 07/26/2023   Weakness 02/18/2020    Past surgical history: Past Surgical History:  Procedure Laterality Date   ATRIAL FIBRILLATION ABLATION N/A 07/17/2024   Procedure: ATRIAL FIBRILLATION ABLATION;  Surgeon: Inocencio Soyla Lunger, MD;  Location: [facility] INVASIVE CV LAB;  Service: Cardiovascular;  Laterality: N/A;   CESAREAN SECTION     CORONARY STENT INTERVENTION N/A 09/18/2017   Procedure: CORONARY STENT INTERVENTION;  Surgeon: Wonda Sharper, MD;  Location: [facility] INVASIVE CV LAB;  Service: Cardiovascular;  Laterality: N/A;  prox LAD   LEFT HEART CATH AND CORONARY ANGIOGRAPHY N/A 09/18/2017   Procedure: LEFT HEART CATH AND CORONARY ANGIOGRAPHY;  Surgeon: Wonda Sharper, MD;  Location: [facility] INVASIVE CV LAB;  Service: Cardiovascular;  Laterality: N/A;   THYROID  SURGERY     s/p RAI for Grave's    Family history:  Family History  Problem Relation Age of Onset   Heart disease Mother    Hypertension Mother    Heart disease Father    Diabetes Father     Social history: Lives in a home without carpeting with electric heating and central cooling.  Dog and 2 parrots in the home.  She is disabled.  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     albuterol   (PROVENTIL ) (2.5 MG/3ML) 0.083% nebulizer solution Take 3 mLs (2.5 mg total) by nebulization every 6 (six) hours as needed for wheezing or shortness of breath. 75 mL 12   ALPRAZolam  (XANAX ) 0.5 MG tablet Take 0.5 mg by mouth at bedtime as needed for anxiety.     apixaban  (ELIQUIS ) 5 MG TABS tablet Take 1 tablet (5 mg total) by mouth 2 (two) times daily. 180 tablet 3   Calcium  Carbonate Antacid (TUMS PO) Take 1 tablet by mouth daily as needed (heartburn).     cetirizine (ZYRTEC) 10 MG tablet Take 10 mg by mouth daily as needed (allergic reaction).     colchicine  0.6 MG tablet Take 1 tablet (0.6 mg total) by mouth at bedtime.     diltiazem  (TIAZAC ) 240 MG 24 hr capsule Take 1 capsule (240 mg total) by mouth daily. 90 capsule 3   EPINEPHrine  0.3 mg/0.3 mL IJ SOAJ injection Inject 0.3 mg into the muscle as needed for anaphylaxis. As needed for life-threatening allergic reactions 2 each 1  Evolocumab  (REPATHA  SURECLICK) 140 MG/ML SOAJ Inject 140 mg into the skin every 14 (fourteen) days. 6 mL 3   levothyroxine  (SYNTHROID ) 200 MCG tablet Take 1 tablet (200 mcg total) by mouth every morning. 30 tablet 0   metolazone  (ZAROXOLYN ) 2.5 MG tablet Take 1 tablet (2.5 mg total) by mouth once a week.     ondansetron  (ZOFRAN ) 4 MG tablet Take 4 mg by mouth See admin instructions. Once or twice daily as needed     OVER THE COUNTER MEDICATION Apply 1 Application topically daily as needed (hands and feet). CBD cream (4,000 units)     polyethylene glycol (MIRALAX  / GLYCOLAX ) 17 g packet Take 17 g by mouth once a week.     potassium chloride  (KLOR-CON ) 10 MEQ tablet Take 1 tablet (10 mEq total) by mouth daily. 30 tablet 2   torsemide  (DEMADEX ) 20 MG tablet Take 1 tablet by mouth 2 times daily. 180 tablet 3   VENTOLIN  HFA 108 (90 Base) MCG/ACT inhaler INHALE 2 PUFFS BY MOUTH EVERY 4 HOURS ASNEEDED FOR SHORTNESS OF BREATH. 18 g 0   Vitamin D, Ergocalciferol, (DRISDOL) 1.25 MG (50000 UNIT) CAPS capsule Take 50,000 Units by  mouth once a week.     BREO ELLIPTA  200-25 MCG/ACT AEPB Inhale 1 puff into the lungs daily.     OZEMPIC, 2 MG/DOSE, 8 MG/3ML SOPN Inject 2 mg into the skin once a week.     Current Facility-Administered Medications  Medication Dose Route Frequency Provider Last Rate Last Admin   triamcinolone  acetonide (KENALOG ) 10 MG/ML injection 10 mg  10 mg Other Once Stover, Titorya, DPM        Known medication allergies: Allergies[1]   Physical examination: Blood pressure 126/78, pulse (!) 104, resp. rate 16, height 5' 1 (1.549 m), weight 216 lb (98 kg), last menstrual period 09/15/2017, SpO2 98%.  General: Alert, interactive, in no acute distress. HEENT: PERRLA, TMs pearly gray, turbinates non-edematous without discharge, post-pharynx non erythematous. Neck: Supple without lymphadenopathy. Lungs: Clear to auscultation without wheezing, rhonchi or rales. {no increased work of breathing. CV: Normal S1, S2 without murmurs. Abdomen: Nondistended, nontender. Skin: Numerous hard white nodules on elbows b/l. Extremities:  No clubbing, cyanosis or edema. Neuro:   Grossly intact.  Diagnostics/Labs: none today  Assessment and plan:   Anaphylactic reaction Acute anaphylaxis post-ingestion of [employer] chili, necessitating ER intervention and EpiPen  use.  - Prescribed EpiPen  for emergency use. - Follow emergency action plan in case of allergic reaction.  - Ordered allergy testing for alpha-gal, xanthan gum,  milk and soy.   Not concerned for other foods in the chili as you have eaten these foods since without issue (tomato, beans, peppers, onions, celery, chili spices) - Ordered tryptase level to assess allergy cell activation as well as environmental allergy panel. - Advised Zyrtec 5 mg for itching/hives due to renal impairment. - Advised Pepcid  20 mg for allergy symptoms. - Symptoms less likely due to a Betadine but we will see what the testing above shows before pursuing testing for  better  History of shellfish allergy Reported shrimp allergy during pregnancy 30 years ago. No recent exposure or reactions. Consideration for outgrown allergy. - Ordered shellfish allergy testing. - Discussed potential food challenge if testing negative.  Follow-up in 3-4 months or sooner if needed  I appreciate the opportunity to take part in Hazel's care. Please do not hesitate to contact me with questions.  Sincerely,   Danita Brain, MD Allergy/Immunology [facility]     [  1]  Allergies Allergen Reactions   Enoxaparin  Other (See Comments)    Other Reaction(s): Other (See Comments)    Patient stated it was added to her chart in 2019 after her MI. She stated she was not alert enough to remember why it was added   Iodine Hives and Itching   Sulfa  Antibiotics Other (See Comments)   Azithromycin Palpitations   Shellfish Allergy Hives and Itching   "

## 2024-08-14 ENCOUNTER — Encounter (HOSPITAL_COMMUNITY): Payer: Self-pay | Admitting: Nurse Practitioner

## 2024-08-14 ENCOUNTER — Other Ambulatory Visit (HOSPITAL_COMMUNITY): Payer: Self-pay

## 2024-08-14 ENCOUNTER — Ambulatory Visit (HOSPITAL_COMMUNITY)
Admission: RE | Admit: 2024-08-14 | Discharge: 2024-08-14 | Disposition: A | Source: Ambulatory Visit | Attending: Nurse Practitioner | Admitting: Nurse Practitioner

## 2024-08-14 VITALS — BP 128/76 | HR 107 | Ht 61.0 in | Wt 216.8 lb

## 2024-08-14 DIAGNOSIS — I2510 Atherosclerotic heart disease of native coronary artery without angina pectoris: Secondary | ICD-10-CM

## 2024-08-14 DIAGNOSIS — E6609 Other obesity due to excess calories: Secondary | ICD-10-CM | POA: Diagnosis not present

## 2024-08-14 DIAGNOSIS — I480 Paroxysmal atrial fibrillation: Secondary | ICD-10-CM | POA: Diagnosis not present

## 2024-08-14 DIAGNOSIS — E039 Hypothyroidism, unspecified: Secondary | ICD-10-CM

## 2024-08-14 DIAGNOSIS — I4891 Unspecified atrial fibrillation: Secondary | ICD-10-CM | POA: Diagnosis not present

## 2024-08-14 DIAGNOSIS — I5020 Unspecified systolic (congestive) heart failure: Secondary | ICD-10-CM

## 2024-08-14 DIAGNOSIS — I429 Cardiomyopathy, unspecified: Secondary | ICD-10-CM

## 2024-08-14 DIAGNOSIS — J439 Emphysema, unspecified: Secondary | ICD-10-CM | POA: Diagnosis not present

## 2024-08-14 LAB — TRYPTASE: Tryptase: 12.2 ug/L (ref 2.2–13.2)

## 2024-08-14 LAB — ALLERGEN PROFILE WITH TOTAL IGE, RESPIRATORY-AREA 2
Alternaria Alternata IgE: <0.1 kU/L
Aspergillus Fumigatus IgE: 0.11 kU/L — AB
Bermuda Grass IgE: <0.1 kU/L
Cat Dander IgE: <0.1 kU/L
Cedar, Mountain IgE: 0.79 kU/L — AB
Cladosporium Herbarum IgE: <0.1 kU/L
Cockroach, German IgE: <0.1 kU/L
Common Silver Birch IgE: <0.1 kU/L
Cottonwood IgE: <0.1 kU/L
D Farinae IgE: <0.1 kU/L
D Pteronyssinus IgE: <0.1 kU/L
Dog Dander IgE: 0.35 kU/L — AB
Elm, American IgE: <0.1 kU/L
Johnson Grass IgE: <0.1 kU/L
Maple/Box Elder IgE: 0.21 kU/L — AB
Mouse Urine IgE: <0.1 kU/L
Oak, White IgE: <0.1 kU/L
Pecan, Hickory IgE: 0.11 kU/L — AB
Penicillium Chrysogen IgE: <0.1 kU/L
Pigweed, Rough IgE: <0.1 kU/L
Ragweed, Short IgE: <0.1 kU/L
Sheep Sorrel IgE Qn: <0.1 kU/L
Timothy Grass IgE: 0.15 kU/L — AB
White Mulberry IgE: <0.1 kU/L

## 2024-08-14 LAB — ALLERGEN PROFILE, SHELLFISH
Clam IgE: <0.1 kU/L
F023-IgE Crab: <0.1 kU/L
F080-IgE Lobster: <0.1 kU/L
F290-IgE Oyster: <0.1 kU/L
Scallop IgE: <0.1 kU/L
Shrimp IgE: <0.1 kU/L

## 2024-08-14 LAB — ALPHA-GAL PANEL
Allergen Lamb IgE: <0.1 kU/L
Beef IgE: <0.1 kU/L
IgE (Immunoglobulin E), Serum: 562 [IU]/mL — ABNORMAL HIGH (ref 6–495)
O215-IgE Alpha-Gal: <0.1 kU/L
Pork IgE: <0.1 kU/L

## 2024-08-14 LAB — ALLERGEN SOYBEAN: Soybean IgE: <0.1 kU/L

## 2024-08-14 LAB — ALLERGEN MILK: Milk IgE: 1.22 kU/L — AB

## 2024-08-14 LAB — XANTHAN GUM IGE
Class Interpretation: 1
Xanthan Gum, IgE*: 0.39 kU/L — ABNORMAL HIGH

## 2024-08-14 MED ORDER — DILTIAZEM HCL 30 MG PO TABS
30.0000 mg | ORAL_TABLET | ORAL | 1 refills | Status: AC | PRN
  Filled 2024-08-14 (×2): qty 45, 8d supply, fill #0

## 2024-08-14 NOTE — Progress Notes (Signed)
 "    Primary Care Physician: Katie Leeroy Ground, NP Primary Cardiologist: Katie Fitch, MD Electrophysiologist: Katie Gladis Norton, MD  Referring Physician: Soyla Gladis Norton, MD   Katie Rasmussen is a 54 y.o. female with a history of paroxysmal AF s/p PVI 07/17/2024, chronic combined CHF/ICM, HTN, HLD, COPD, hypothyroidism, obesity, CAD s/p PTCA/DES to LAD 09/2017, SVT (AVNRT and atrial tach), CKD stage III who presents for follow up in the [facility].  The patient was initially diagnosed with SVT/AVNRT in 2024 and was managed with rate control and deemed not a candidate for ablation at that time secondary to obesity.  She was seen by Dr. Norton on 04/28/2024 and reported feeling poorly while in atrial fibrillation/flutter.  She was not a good candidate for amiodarone due to her underlying lung and thyroid  disease.  She also did not want to pursue Tikosyn due to long hospitalization required.  Shared decision was made and ablation was elected as treatment.  He underwent procedure with Dr. Norton on 07/17/2024 with PVI as well as CVI and multiple atrial tachycardias ablated.  She was discharged in stable condition postprocedure. Patient presents today for follow up for atrial fibrillation.   Katie Rasmussen presents today with her husband and son for post ablation follow-up.She underwent a cardiac ablation procedure and is currently experiencing episodes of heart racing.  She had no groin site complications and has increased physical activity to preprocedure level.  These episodes are less severe than those prior to the ablation and last for seconds. She has not missed any doses of her blood thinner.She inquires about the use of gabapentin and is informed that it can be taken with her current medications, except for  and Xanax . She has not yet started gabapentin. She uses Xanax  infrequently, primarily during episodes of heart racing that prevent her from sleeping,  taking only half a tablet as needed. Her current medications include torsemide , mirtazapine, Jardiance, and Ozempic. She has a history of chronic gout, which makes increasing physical activity challenging. Despite this, she has successfully lost 80 pounds, which she attributes to her efforts to manage her atrial fibrillation. She reports feeling a 'jittery' sensation when her heart rate increases, which she attributes to adrenaline. She is currently on Cardizem  (diltiazem ) 240 mg.   Discussed the use of AI scribe software for clinical note transcription with the patient, who gave verbal consent to proceed.   Notes: History of COPD, hypothyroidism, CHF, and CAD - Not a good candidate long-term for amiodarone, declined initiation of dofetilide  Atrial Fibrillation Management history: Previous antiarrhythmic drugs: None Previous cardioversions: None Previous ablations: 07/2024 Anticoagulation history: Eliquis   ROS- All systems are reviewed and negative except as per the HPI above.  Past Medical History:  Diagnosis Date   A-fib (HCC) 01/09/2023   Acute combined systolic and diastolic CHF, NYHA class 3 (HCC)    Acute on chronic respiratory failure with hypoxia and hypercapnia (HCC) 09/14/2017   Ankle sprain 02/18/2020   Asthma exacerbation 02/18/2020   Bronchitis due to tobacco use 08/2017   Chest pain 02/18/2020   Chronic respiratory failure (HCC)    Chronic venous insufficiency 08/30/2022   CKD (chronic kidney disease), stage III (HCC)    Class 3 severe obesity with serious comorbidity in adult [facility]) 06/14/2022   COPD (chronic obstructive pulmonary disease) (HCC)    Coronary artery disease    DCM (dilated cardiomyopathy) (HCC)    Diastolic CHF (HCC)    Dyslipidemia 10/08/2017   Dyspnea  on exertion 05/07/2018   Elevated troponin    Gout    Hypertension 02/18/2020   Hypothyroidism    h/o Grave's, s/p thyroidectomy & RAI in 1990s   Ischemic cardiomyopathy initial ejection fraction  35 to 40% in March 2019, normalization of ejection fraction based on echocardiogram from 2020 10/08/2017   Ejection fraction 35-40% in March 2019   Leukocytosis 02/18/2020   Obesity hypoventilation syndrome (HCC)    Orthostatic hypotension 02/18/2020   OSA (obstructive sleep apnea) 02/18/2020   Peripheral edema 02/18/2020   Pneumonia 08/2017   Postablative hypothyroidism 04/03/2019   Respiratory acidosis 02/18/2020   Sepsis (HCC) 02/18/2020   Supraventricular tachycardia 07/08/2020   Systolic CHF with reduced left ventricular function, NYHA class 3 (HCC) 09/13/2017   Type 2 diabetes mellitus with other specified complication (HCC) 07/26/2023   Upper respiratory infection 02/18/2020   Vitamin D deficiency 07/26/2023   Weakness 02/18/2020   Past Surgical History:  Procedure Laterality Date   ATRIAL FIBRILLATION ABLATION N/A 07/17/2024   Procedure: ATRIAL FIBRILLATION ABLATION;  Surgeon: Inocencio Katie Lunger, MD;  Location: [facility] INVASIVE CV LAB;  Service: Cardiovascular;  Laterality: N/A;   CESAREAN SECTION     CORONARY STENT INTERVENTION N/A 09/18/2017   Procedure: CORONARY STENT INTERVENTION;  Surgeon: Wonda Sharper, MD;  Location: [facility] INVASIVE CV LAB;  Service: Cardiovascular;  Laterality: N/A;  prox LAD   LEFT HEART CATH AND CORONARY ANGIOGRAPHY N/A 09/18/2017   Procedure: LEFT HEART CATH AND CORONARY ANGIOGRAPHY;  Surgeon: Wonda Sharper, MD;  Location: [facility] INVASIVE CV LAB;  Service: Cardiovascular;  Laterality: N/A;   THYROID  SURGERY     s/p RAI for Grave's   Enoxaparin , Iodine, Sulfa  antibiotics, Azithromycin, and Shellfish allergy Current Outpatient Medications  Medication Sig Dispense Refill   ACETAMINOPHEN  PO Take 650 mg by mouth every 6 (six) hours as needed for mild pain (pain score 1-3) or moderate pain (pain score 4-6).     albuterol  (PROVENTIL ) (2.5 MG/3ML) 0.083% nebulizer solution Take 3 mLs (2.5 mg total) by nebulization every 6 (six) hours as needed for wheezing or  shortness of breath. 75 mL 12   ALPRAZolam  (XANAX ) 0.5 MG tablet Take 0.5 mg by mouth at bedtime as needed for anxiety.     apixaban  (ELIQUIS ) 5 MG TABS tablet Take 1 tablet (5 mg total) by mouth 2 (two) times daily. 180 tablet 3   BREO ELLIPTA  200-25 MCG/ACT AEPB Inhale 1 puff into the lungs daily.     Calcium  Carbonate Antacid (TUMS PO) Take 1 tablet by mouth daily as needed (heartburn).     cetirizine (ZYRTEC) 10 MG tablet Take 10 mg by mouth daily as needed (allergic reaction).     colchicine  0.6 MG tablet Take 1 tablet (0.6 mg total) by mouth at bedtime.     diltiazem  (TIAZAC ) 240 MG 24 hr capsule Take 1 capsule (240 mg total) by mouth daily. 90 capsule 3   EPINEPHrine  0.3 mg/0.3 mL IJ SOAJ injection Inject 0.3 mg into the muscle as needed for anaphylaxis. As needed for life-threatening allergic reactions 2 each 1   Evolocumab  (REPATHA  SURECLICK) 140 MG/ML SOAJ Inject 140 mg into the skin every 14 (fourteen) days. 6 mL 3   levothyroxine  (SYNTHROID ) 200 MCG tablet Take 1 tablet (200 mcg total) by mouth every morning. 30 tablet 0   metolazone  (ZAROXOLYN ) 2.5 MG tablet Take 1 tablet (2.5 mg total) by mouth once a week.     ondansetron  (ZOFRAN ) 4 MG tablet Take 4 mg by mouth See  admin instructions. Once or twice daily as needed     OVER THE COUNTER MEDICATION Apply 1 Application topically daily as needed (hands and feet). CBD cream (4,000 units)     OZEMPIC, 2 MG/DOSE, 8 MG/3ML SOPN Inject 2 mg into the skin once a week.     polyethylene glycol (MIRALAX  / GLYCOLAX ) 17 g packet Take 17 g by mouth once a week.     potassium chloride  (KLOR-CON ) 10 MEQ tablet Take 1 tablet (10 mEq total) by mouth daily. 30 tablet 2   torsemide  (DEMADEX ) 20 MG tablet Take 1 tablet by mouth 2 times daily. 180 tablet 3   VENTOLIN  HFA 108 (90 Base) MCG/ACT inhaler INHALE 2 PUFFS BY MOUTH EVERY 4 HOURS ASNEEDED FOR SHORTNESS OF BREATH. 18 g 0   Vitamin D, Ergocalciferol, (DRISDOL) 1.25 MG (50000 UNIT) CAPS capsule Take  50,000 Units by mouth once a week.     Current Facility-Administered Medications  Medication Dose Route Frequency Provider Last Rate Last Admin   triamcinolone  acetonide (KENALOG ) 10 MG/ML injection 10 mg  10 mg Other Once Burt Fus, [place]        Physical Exam: LMP 09/15/2017   GEN: Well nourished, well developed in no acute distress NECK: No JVD; No carotid bruits CARDIAC: Regular rate and rhythm, no murmurs, rubs, gallops RESPIRATORY:  Clear to auscultation without rales, wheezing or rhonchi  ABDOMEN: Soft, non-tender, non-distended EXTREMITIES:  No edema; No deformity   Wt Readings from Last 3 Encounters:  08/05/24 98 kg  07/30/24 96.7 kg  07/17/24 97.5 kg    Lab Results  Component Value Date   TSH 0.30 (A) 04/30/2024   EKG today demonstrates:   EKG Interpretation Date/Time:    Ventricular Rate:    PR Interval:    QRS Duration:    QT Interval:    QTC Calculation:   R Axis:      Text Interpretation:          Echo Completed 05/21/2024:  1. Left ventricular ejection fraction, by estimation, is 60 to 65%. The  left ventricle has normal function. The left ventricle has no regional  wall motion abnormalities. Left ventricular diastolic parameters are  indeterminate. Elevated left ventricular  end-diastolic pressure. The average left ventricular global longitudinal  strain is 19.3 %. The global longitudinal strain is normal.   2. Right ventricular systolic function is normal. The right ventricular  size is normal.   3. The mitral valve is normal in structure. No evidence of mitral valve  regurgitation. No evidence of mitral stenosis.   4. The aortic valve has an indeterminant number of cusps. Aortic valve  regurgitation is not visualized. No aortic stenosis is present.   5. The inferior vena cava is normal in size with greater than 50%  respiratory variability, suggesting right atrial pressure of 3 mmHg.   CHA2DS2-VASc Score = 4  The patient's score is  based upon: CHF History: 0 HTN History: 1 Diabetes History: 1 Stroke History: 0 Vascular Disease History: 1 Age Score: 0 Gender Score: 1      ASSESSMENT AND PLAN: Paroxysmal Atrial Fibrillation (ICD10:  I48.0) The patient's CHA2DS2-VASc score is 4, indicating a 4.8% annual risk of stroke.  - -Post-ablation with occasional tachycardia, no atrial fibrillation detected.  - Prescribed diltiazem  30 mg tablets as needed for heart rate above 120 bpm, up to 60 mg every 6 hours. - Encouraged lifestyle modifications including managing sleep apnea, moderating alcohol and caffeine intake, and increasing physical activity. -  Recommended weight loss of 10% to reduce AFib recurrence  - Suggested using [employer] for home heart rate monitoring.  CAD:  -s/p DES to LAD in 2019, Stable with no anginal symptoms. No indication for ischemic evaluation.  Continue Plavix  75 mg daily, continue aspirin  81 mg daily.   HFrEF/ICM: - Patient was euvolemic on examination today with most recent 2D echo showing EF of 60 to 65% - Continue current GDMT as prescribed  COPD: - Continue treatment plan per pulmonology/PCP  Hypothyroidism:  - Most recent TSH was 0.30  Obesity: Patient currently on GLP-1 with BMI of 40.96 kg/m  Secondary Hypercoagulable State (ICD10:  D68.69) The patient is at significant risk for stroke/thromboembolism based upon her CHA2DS2-VASc Score of 4.  Continue Apixaban  (Eliquis ).    Signed,  Wyn Raddle, Jackee Shove, NP    08/14/2024 7:31 AM       Do not forget to refresh clinic note for EKG  Follow up with the AF Clinic in 3 months    "

## 2024-08-14 NOTE — Patient Instructions (Signed)
 May take diltiazem  (CARDIZEM ) 30 MG every 4 hours AS NEEDED for Heart rate 120 and above

## 2024-10-15 ENCOUNTER — Ambulatory Visit (HOSPITAL_COMMUNITY): Admitting: Nurse Practitioner

## 2024-11-10 ENCOUNTER — Ambulatory Visit (HOSPITAL_COMMUNITY): Admitting: Nurse Practitioner

## 2024-11-11 ENCOUNTER — Ambulatory Visit: Admitting: Allergy

## 2024-11-28 ENCOUNTER — Ambulatory Visit: Admitting: Cardiology
